# Patient Record
Sex: Male | Born: 1940 | Race: White | Hispanic: No | Marital: Married | Smoking: Former smoker
Health system: Southern US, Community
[De-identification: ages and names within clinical notes are randomized; demographics above are authoritative.]

## PROBLEM LIST (undated history)

## (undated) DIAGNOSIS — H409 Unspecified glaucoma: Secondary | ICD-10-CM

## (undated) DIAGNOSIS — E785 Hyperlipidemia, unspecified: Secondary | ICD-10-CM

## (undated) DIAGNOSIS — I2510 Atherosclerotic heart disease of native coronary artery without angina pectoris: Secondary | ICD-10-CM

## (undated) DIAGNOSIS — S8290XA Unspecified fracture of unspecified lower leg, initial encounter for closed fracture: Secondary | ICD-10-CM

## (undated) DIAGNOSIS — M5410 Radiculopathy, site unspecified: Secondary | ICD-10-CM

## (undated) DIAGNOSIS — I6529 Occlusion and stenosis of unspecified carotid artery: Secondary | ICD-10-CM

## (undated) DIAGNOSIS — M1990 Unspecified osteoarthritis, unspecified site: Secondary | ICD-10-CM

## (undated) DIAGNOSIS — IMO0001 Reserved for inherently not codable concepts without codable children: Secondary | ICD-10-CM

## (undated) DIAGNOSIS — L84 Corns and callosities: Secondary | ICD-10-CM

## (undated) DIAGNOSIS — E669 Obesity, unspecified: Secondary | ICD-10-CM

## (undated) DIAGNOSIS — N400 Enlarged prostate without lower urinary tract symptoms: Secondary | ICD-10-CM

## (undated) DIAGNOSIS — I219 Acute myocardial infarction, unspecified: Secondary | ICD-10-CM

## (undated) DIAGNOSIS — M21619 Bunion of unspecified foot: Secondary | ICD-10-CM

## (undated) DIAGNOSIS — I10 Essential (primary) hypertension: Secondary | ICD-10-CM

## (undated) HISTORY — DX: Unspecified glaucoma: H40.9

## (undated) HISTORY — DX: Hyperlipidemia, unspecified: E78.5

## (undated) HISTORY — DX: Atherosclerotic heart disease of native coronary artery without angina pectoris: I25.10

## (undated) HISTORY — PX: CORONARY ANGIOPLASTY: SHX604

## (undated) HISTORY — DX: Essential (primary) hypertension: I10

## (undated) HISTORY — DX: Acute myocardial infarction, unspecified: I21.9

## (undated) HISTORY — DX: Radiculopathy, site unspecified: M54.10

## (undated) HISTORY — DX: Obesity, unspecified: E66.9

## (undated) HISTORY — DX: Unspecified fracture of unspecified lower leg, initial encounter for closed fracture: S82.90XA

## (undated) HISTORY — DX: Bunion of unspecified foot: M21.619

## (undated) HISTORY — DX: Occlusion and stenosis of unspecified carotid artery: I65.29

## (undated) HISTORY — PX: UMBILICAL HERNIA REPAIR: SHX196

## (undated) HISTORY — DX: Corns and callosities: L84

## (undated) HISTORY — PX: COLONOSCOPY: SHX174

---
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
## 1958-08-29 HISTORY — PX: HERNIA REPAIR: SHX51

## 1990-08-29 DIAGNOSIS — I219 Acute myocardial infarction, unspecified: Secondary | ICD-10-CM

## 1990-08-29 HISTORY — DX: Acute myocardial infarction, unspecified: I21.9

## 2004-05-29 HISTORY — PX: CARDIAC CATHETERIZATION: SHX172

## 2004-06-25 ENCOUNTER — Ambulatory Visit (HOSPITAL_COMMUNITY): Admission: AD | Admit: 2004-06-25 | Discharge: 2004-06-26 | Payer: Self-pay | Admitting: Cardiology

## 2007-01-03 HISTORY — PX: US ECHOCARDIOGRAPHY: HXRAD669

## 2008-03-04 ENCOUNTER — Encounter: Admission: RE | Admit: 2008-03-04 | Discharge: 2008-03-04 | Payer: Self-pay | Admitting: Family Medicine

## 2010-02-26 HISTORY — PX: CARDIOVASCULAR STRESS TEST: SHX262

## 2010-09-19 ENCOUNTER — Encounter: Payer: Self-pay | Admitting: Family Medicine

## 2011-01-14 NOTE — Discharge Summary (Signed)
NAMEDEMARCUS, THIELKE               ACCOUNT NO.:  [id number]   MEDICAL RECORD NO.:  [id number]          PATIENT TYPE:  OIB   LOCATION:  6527                         FACILITY:  [facility]   PHYSICIAN:  Colleen Can. Deborah Chalk, M.D.DATE OF BIRTH:  01/02/1941   DATE OF ADMISSION:  DATE OF DISCHARGE:  06/26/2004                                 DISCHARGE SUMMARY   PRIMARY DISCHARGE DIAGNOSES:  Atypical chest pain in a setting of an  abnormal Cardiolite study with subsequent elective cardiac catheterization  showing 100% distal right coronary artery occlusion with left-to-right  collaterals as well as antegrade flow, normal left main, 50% narrowing in  the left circumflex proximally with irregularity to the LAD.  Angioplasty of  the right coronary artery was unsuccessful.   SECONDARY DISCHARGE DIAGNOSES:  1.  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  2.  Hyperlipidemia.  3.  Flushing of uncertain etiology.  4.  Known C5-C6 radiculopathy.  5.  Hypertension.  6.  Obesity.   HISTORY OF PRESENT ILLNESS:  Mr. Lerew is a pleasant 70 year old male who  has known coronary disease.  He had an inferior posterior myocardial  infarction in July of 1992, and has been followed medically since that time.  He has had some complaints of atypical chest pain with some mild shortness  of breath.  He was referred for his routine Cardiolite study which was  performed in October of 2005.  This did show inferior ST-changes on his EKG.  His Cardiolite imaging showed inferior basilar perfusion abnormality.  He  was subsequently referred for repeat cardiac catheterization.   Please see the dictated history and physical for further patient  presentation and profile.   LABORATORY DATA:  CBC was normal.  PT and PTT were unremarkable.  Chemistries were normal except for glucose of 116.   HOSPITAL COURSE:  The patient was admitted electively.   We proceeded on with  cardiac catheterization on June 25, 2004.  Those results are as noted  above.  Angioplasty to the right coronary was unsuccessful.  Post-procedure,  he was transferred to 6500.  His groin has remained satisfactory.  It in  anticipation for him to be discharge in the morning if deemed stable on a.m.  rounds.  He will be started on Plavix 75 mg a day on November 7, with plans  for repeat catheterization and with repeat attempts at percutaneous coronary  intervention on the week of July 12, 2004.   DISCHARGE CONDITION:  Stable.   DISCHARGE MEDICINES:  1.  He was will resume all of his previous medicines.  2.  We will start Plavix 75 mg a day as of November 7, to be taken along      with aspirin.  3.  We will see him back in the office on November 7.  He is asked to call      to set up an actual time for that date.  4.  He is to call our office if any problems arise in the interim.  LC/MEDQ  D:  06/25/2004  T:  06/26/2004  Job:  [id number]   cc:   Miguel Aschoff, M.D.  [address]  [place]  [place] [address]  Fax: [phone]

## 2011-01-14 NOTE — Cardiovascular Report (Signed)
NAMELARRY, Tyler               ACCOUNT NO.:  [id number]   MEDICAL RECORD NO.:  [id number]          PATIENT TYPE:  OIB   LOCATION:  6527                         FACILITY:  [facility]   PHYSICIAN:  Colleen Can. Deborah Chalk, M.D.DATE OF BIRTH:  1941-08-05   DATE OF PROCEDURE:  DATE OF DISCHARGE:                              CARDIAC CATHETERIZATION   HISTORY:  [name] had previous remote myocardial infarction.  He presents  with abnormal stress Cardiolite study with reversible inferior ischemia.  He  is referred now for catheterization.   PROCEDURE:  Left heart catheterization with selective coronary angiography,  left ventricular angiography, attempt at angioplasty and crossing of the  chronic total occlusion of the right coronary artery.   TYPE AND SITE OF ENTRY:  Percutaneous, right femoral artery.   CATHETERS:  6-French 4 curved Judkins right and left coronary catheters; 6-  French pigtail ventriculographic catheter; 7-French JL4 guide with side  holes; Asahi medium wire, Prowater; a Miracle 4.5 wire with a 2.0 x 20 mm  over-the-wire Maverick balloon.   CONTRAST MATERIAL:  Omnipaque 280 mL.   MEDICATIONS GIVEN PRIOR TO THE PROCEDURE:  Versed 3 mg IV, Angiomax, and  Plavix 300 mg p.o.   COMMENTS:  The patient tolerated the procedure well.   HEMODYNAMIC DATA:  1.  The aortic pressure was 136/78.  2.  LV was 123/9-14.  3.  There was no aortic valve gradient noted on pullback.   ANGIOGRAPHIC DATA:  1.  Left main coronary artery is normal.  2.  Left circumflex has a high obtuse marginal, is tortuous with      irregularities.  There is 50% narrowing in the main body of the left      circumflex, but no significant focal obstructive disease.  3.  Left anterior descending:  Left anterior descending has irregularities.      There is collateral flow by way of large septal perforating branches to      the distal posterior descending vessel.  4.  Right coronary artery:  The right  coronary artery is totally occluded.      There is bidirectional fill from the posterior descending and large      lateral branches.  There appears to be a short segment of total      occlusion.   LEFT VENTRICULOGRAM:  Left ventricular angiogram was performed in the RAO  position.  Overall cardiac size and silhouette are normal.  The global left  ventricular function study shows an ejection fraction of 60%.   ATTEMPTED ANGIOPLASTY:  Using a JL4 guide we initially attempted to pass a  medium Asahi wire.  We were unable to pass this and then proceeded on with a  Prowater guide and then attempted to cross with a Miracle Brothers 4.5  guidewire.  We were able to pass the Prowater guidewire down the right  coronary artery, but were unable to cross the lesion just before the crux in  the right coronary artery.  We used the Prowater to allow [place] to going into  large acute marginal vessel.  We used the 2.0 x 20  mm Maverick balloon to  allow [place] to redirect the Miracle Brothers wire into the total occlusion.  As  we were advancing the wire we were able to advance the Miracle Brothers wire  into the posterior descending.  However, at that point in time guide backup  became an issue and the guide catheter basically prolapsed out of the right  coronary artery and we were no longer able to have guide position.  Because  of the length of the procedure as well as the amount of contrast given, it  was elected to abort the procedure at this point in time and return at the  time we have adequate backup and are able to have a greater range of  contrast medium to use to be able to cross the lesion.   OVERALL IMPRESSION:  1.  Normal left ventricular function.  2.  Totally occluded right coronary artery.  3.  Moderate coronary atherosclerosis in the left system.  4.  Unsuccessful attempt at percutaneous intervention of the right coronary      artery.       SNT/MEDQ  D:  06/25/2004  T:  06/25/2004  Job:   [id number]

## 2011-01-14 NOTE — H&P (Signed)
Curtis Tyler, Curtis Tyler               ACCOUNT NO.:  [id number]   MEDICAL RECORD NO.:  [id number]          PATIENT TYPE:  OIB   LOCATION:                               FACILITY:  [facility]   PHYSICIAN:  Colleen Can. Deborah Chalk, M.D.DATE OF BIRTH:  Sep 02, 1940   DATE OF ADMISSION:  06/25/2004  DATE OF DISCHARGE:                                HISTORY & PHYSICAL   CHIEF COMPLAINT:  None.   HISTORY OF PRESENT ILLNESS:  Mr. Needs is a 70 year old male who has a  known history of coronary disease.  He suffered an inferior posterior  myocardial infarction in July of 1992.  He had a heart catheterization  performed at [facility] in [place].  He has basically  done well since that time and has been followed along clinically with  routine treadmill testing. He was seen in the office towards the latter part  of September and was referred for a Cardiolite study.  This was performed on  June 01, 2004.  The patient exercised on the standard Bruce protocol for a  total of 12 minutes.  He had excellent and stable exercise tolerance.  His  blood pressure response was adequate.  He had no complaints of chest pain.  There was some mild shortness of breath.  His EKG did show inferior ST  changes, and the Cardiolite images showed inferobasilar perfusion  abnormality with possible discrete inferobasal hypokinesia.  The LV ejection  fraction was 70%.  In light of these findings, he is now referred for repeat  cardiac catheterization.  Clinically, he has had no complaints of chest  pain.  There has been some questionable sharp, stabbing like discomfort in  the left shoulder with walking.   PAST MEDICAL HISTORY:  1.  Atherosclerotic cardiovascular disease with reported inferior posterior      myocardial infarction in July of 1992.  His catheterization at that time      showed the first septal perforating branch with a 70% narrowing, 40%      left circumflex, 30% OM, dominant right coronary with  a 50% mid vessel      narrowing and 100% occlusion of the distal right.  2.  Hyperlipidemia.  3.  History of flushing of uncertain etiology.  4.  Known C5-C6 radiculopathy.  5.  Hypertension.  6.  Obesity.   SURGICAL HISTORY:  He has had previous right hernia repair in 1960 and  appendectomy in 1958.   ALLERGIES:  None.   CURRENT MEDICATIONS:  1.  Magnesium daily.  2.  Vytorin 10/40 daily.  3.  Proscar daily.  4.  Flomax 0.4 daily.  5.  Diovan 160 a day.  6.  Saw palmetto daily.  7.  Aspirin daily.   FAMILY HISTORY:  There is a positive history of hypertension and stroke  among his parents who lived up until their late 68's.   SOCIAL HISTORY:  He is married.  He has had no tobacco products for at least  20 years. He has minimal alcohol use.  He lives at home with his wife.   REVIEW  OF SYSTEMS:  Basically as noted above.  He has not really had much in  the way of chest pain.  He has primarily been troubled with intermittent  flushing and redness of the face as well as a burning like discomfort of the  eyes.  He has had a recent 24-hour urine for catecholamines and  metanephrines and 5HIAA which was unremarkable.   PHYSICAL EXAMINATION:  GENERAL:  He is a pleasant, white male, in no acute  distress.  VITAL SIGNS:  Weight 192 pounds.  Blood pressure 170 sitting and 110/80  standing.  Heart rate is 84.  LUNGS:  Clear.  HEART:  Regular rhythm.  ABDOMEN:  Soft, positive bowel sounds, nontender.  EXTREMITIES:  Without edema.  NEUROLOGIC:  Intact.  There are no gross focal deficits.   LABORATORY DATA:  Pertinent labs are pending.   OVERALL IMPRESSION/>  1.  Abnormal stress Cardiolite.  2.  Known ischemic heart disease.  3.  Hyperlipidemia.  4.  Hypertension.  5.  Flushing of uncertain etiology.   PLAN:  1.  We will proceed on with elective cardiac catheterization.  2.  Procedure has been reviewed in full detail, and he is going to proceed      on Friday, June 25, 2004.       ________________________________________  Sharlee Blew, N.P.  ___________________________________________  Colleen Can. Deborah Chalk, M.D.    LC/MEDQ  D:  06/16/2004  T:  06/16/2004  Job:  [id number]   cc:   Miguel Aschoff, M.D.  [address], [address]  [place]  [place] [address]  Fax: [phone]

## 2011-04-25 ENCOUNTER — Encounter: Payer: Self-pay | Admitting: Cardiology

## 2011-04-26 ENCOUNTER — Encounter: Payer: Self-pay | Admitting: Cardiology

## 2011-04-26 ENCOUNTER — Ambulatory Visit (INDEPENDENT_AMBULATORY_CARE_PROVIDER_SITE_OTHER): Payer: Medicare Other | Admitting: Cardiology

## 2011-04-26 VITALS — BP 118/74 | HR 75 | Ht 62.0 in | Wt 202.0 lb

## 2011-04-26 DIAGNOSIS — I679 Cerebrovascular disease, unspecified: Secondary | ICD-10-CM

## 2011-04-26 DIAGNOSIS — E785 Hyperlipidemia, unspecified: Secondary | ICD-10-CM

## 2011-04-26 DIAGNOSIS — I10 Essential (primary) hypertension: Secondary | ICD-10-CM | POA: Insufficient documentation

## 2011-04-26 DIAGNOSIS — I2510 Atherosclerotic heart disease of native coronary artery without angina pectoris: Secondary | ICD-10-CM

## 2011-04-26 NOTE — Patient Instructions (Signed)
Your physician wants you to follow-up in: ONE YEAR You will receive a reminder letter in the mail two months in advance. If you don't receive a letter, please call our office to schedule the follow-up appointment.  

## 2011-04-26 NOTE — Progress Notes (Signed)
HPI: 70 year old male previously followed by Dr. Deborah Chalk for followup of coronary artery disease. The patient had an inferoposterior myocardial infarction in 1992. He was managed medically. Last catheterization in October of 2005 showed an occluded right coronary artery with left to right collaterals. The left main was normal. There was a 50% circumflex and irregularities in the LAD. Attempt at PCI of the right coronary artery was unsuccessful. Last Myoview was performed in July of 2011. There was a small area of mild infarct and peri-infarct ischemia in the inferobasal wall. Ejection fraction was 65%. This was unchanged compared to 2005. He has been treated medically. Also with cerebrovascular disease. Since he was last seen in he denies dyspnea on exertion, orthopnea, PND, pedal edema, syncope or chest pain. He does have problems with aching in his lower extremities bilaterally. No claudication.  Current Outpatient Prescriptions  Medication Sig Dispense Refill  . amLODipine (NORVASC) 5 MG tablet Take 5 mg by mouth daily.        Marland Kitchen aspirin 81 MG tablet Take 81 mg by mouth daily.        . clopidogrel (PLAVIX) 75 MG tablet Take 75 mg by mouth daily.        . Coenzyme Q10 (CO Q 10) 100 MG CAPS Take by mouth daily.        . finasteride (PROSCAR) 5 MG tablet Take 5 mg by mouth daily.        . fish oil-omega-3 fatty acids 1000 MG capsule Take 1 g by mouth daily.        . fluticasone (VERAMYST) 27.5 MCG/SPRAY nasal spray Place 2 sprays into the nose daily.        Marland Kitchen GLUCOSAMINE-CHONDROITIN PO Take by mouth daily.        Marland Kitchen losartan (COZAAR) 100 MG tablet Take 100 mg by mouth daily.        . meloxicam (MOBIC) 7.5 MG tablet Take 7.5 mg by mouth as needed.        . Plant Sterols and Stanols (CHOLEST OFF) 450 MG TABS Take by mouth as directed.        . Tamsulosin HCl (FLOMAX) 0.4 MG CAPS Take 0.4 mg by mouth daily.           Past Medical History  Diagnosis Date  . Hyperlipidemia   . Hypertension   .  Coronary artery disease   . Obesity   . Carotid artery occlusion   . 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 Radiculopathy     Past Surgical History  Procedure Date  . Cardiac catheterization 05/2004  . Hernia repair 1960    right hernia repair  . Appendectomy 1958    History   Social History  . Marital Status: Married    Spouse Name: N/A    Number of Children: N/A  . Years of Education: N/A   Occupational History  . Not on file.   Social History Main Topics  . Smoking status: Former Smoker    Quit date: 08/29/1990  . Smokeless tobacco: Not on file  . Alcohol Use: Yes     minimal  . Drug Use: No  . Sexually Active:    Other Topics Concern  . Not on file   Social History Narrative   Positive HTN and Stroke among his parents who lived up until their late 80's    ROS: no fevers or chills, productive cough, hemoptysis, dysphasia, odynophagia, melena, hematochezia, dysuria, hematuria, rash, seizure activity, orthopnea,  PND, pedal edema, claudication. Remaining systems are negative.  Physical Exam: Well-developed well-nourished in no acute distress.  Skin is warm and dry.  HEENT is normal.  Neck is supple. No thyromegaly.  Chest is clear to auscultation with normal expansion.  Cardiovascular exam is regular rate and rhythm.  Abdominal exam nontender or distended. No masses palpated. Extremities show no edema. neuro grossly intact  ECG NSRl, PVC, RVCD

## 2011-04-26 NOTE — Assessment & Plan Note (Signed)
Continue aspirin and statin. Carotid Dopplers are followed by his primary care.

## 2011-04-26 NOTE — Assessment & Plan Note (Signed)
Continue aspirin and statin. 

## 2011-04-26 NOTE — Assessment & Plan Note (Signed)
Patient discontinued his Crestor one week ago secondary to myalgias. If his symptoms improve we will try a different statin. If not we will resume Crestor.

## 2011-04-26 NOTE — Assessment & Plan Note (Signed)
Blood pressure controlled. Continue present medications. Potassium and renal function were monitored by primary care.

## 2011-05-09 ENCOUNTER — Ambulatory Visit: Payer: Self-pay | Admitting: Cardiology

## 2011-06-06 ENCOUNTER — Encounter: Payer: Self-pay | Admitting: Cardiology

## 2011-06-07 ENCOUNTER — Encounter: Payer: Self-pay | Admitting: Cardiology

## 2011-08-01 ENCOUNTER — Telehealth: Payer: Self-pay | Admitting: Internal Medicine

## 2011-08-01 NOTE — Telephone Encounter (Signed)
I scheduled the pt to see CY tomorrow at 9am, he had a cancellation. I LMTCB if the pt could not keep this appt to reschedule. I will lwave the message open untl tomorrow to see if the pt comes.  Carron Curie, CMA

## 2011-08-01 NOTE — Telephone Encounter (Signed)
Called and spoke with pt. Pt states he had a "cold" approx 6 weeks ago which never cleared up completely.  Pt states he now is having an ongoing cough- worse during the day.  Will cough up clear to yellow colored sputum.  Denies any other symptoms- no f/c/s, increased sob/wheezing/tightness in chest or chest pain or runny/stuffy nose.  Pt states he has been taking Mucinex and OTC cough syrup with no relief of symptoms.  Pt's wife, Bela Nyborg, is a pt of Dr. Roxy Cedar and therefore pt is requesting a consult with CY as well asap.  Please advise.  Thanks.

## 2011-08-01 NOTE — Telephone Encounter (Signed)
Per CY-okay to put in next open consult slot-no working in at this time.

## 2011-08-02 ENCOUNTER — Encounter: Payer: Self-pay | Admitting: Internal Medicine

## 2011-08-02 ENCOUNTER — Ambulatory Visit (INDEPENDENT_AMBULATORY_CARE_PROVIDER_SITE_OTHER)
Admission: RE | Admit: 2011-08-02 | Discharge: 2011-08-02 | Disposition: A | Discharge status: Home / Self Care | Payer: Medicare Other | Source: Ambulatory Visit | Attending: Internal Medicine | Admitting: Internal Medicine

## 2011-08-02 ENCOUNTER — Ambulatory Visit (INDEPENDENT_AMBULATORY_CARE_PROVIDER_SITE_OTHER): Payer: Medicare Other | Admitting: Internal Medicine

## 2011-08-02 VITALS — BP 144/68 | HR 76 | Ht 62.0 in | Wt 205.2 lb

## 2011-08-02 DIAGNOSIS — R05 Cough: Secondary | ICD-10-CM

## 2011-08-02 DIAGNOSIS — J40 Bronchitis, not specified as acute or chronic: Secondary | ICD-10-CM

## 2011-08-02 MED ORDER — BENZONATATE 200 MG PO CAPS: 200.0000 mg | ORAL_CAPSULE | Freq: Three times a day (TID) | ORAL | Status: AC | PRN

## 2011-08-02 NOTE — Patient Instructions (Signed)
Order- CXR  Dx bronchitis  Sample Symbicort 160      2 puffs then rinse mouth, twice daily till sample used up.  Script benzonatate  For cough as needed

## 2011-08-02 NOTE — Progress Notes (Signed)
08/02/11- 70 yoM self referred for persistent cough, complicated by hx of HBP, CAD, cerebrovascular disease. Wife is patient here. PCP C.Miguel Aschoff. He had smoked as much as 2 packs per day until stopping in 1982. He denies prior diagnosed lung disease. October 16 he traveled to [place]. One week after return he developed what seemed an ordinary cold with cough and low-grade fever. Cough was initially productive of slightly discolored sputum. He gradually dried up but he never stopped coughing he doesn't feel badly and denies fever, shortness of breath, chest pain, swelling, nodes, reflux or postnasal drip. He has had pneumococcal vaccine, flu vaccine and shingles vaccine. History of coronary disease with silent MI, HBP. No history of allergic rhinitis or GERD.  ROS - see HPI Constitutional:   No-   weight loss, night sweats, fevers, chills, fatigue, lassitude. HEENT:   No-  headaches, difficulty swallowing, tooth/dental problems, sore throat,       No-  sneezing, itching, ear ache, nasal congestion, post nasal drip,  CV:  No-   chest pain, orthopnea, PND, swelling or discomfort in lower extremities, anasarca,                                  dizziness, palpitations Resp: No-   shortness of breath with exertion or at rest.              No-   productive cough,  + non-productive cough,  No- coughing up of blood.              No-   change in color of mucus.  No- wheezing.   Skin: No-   rash or lesions. GI:  No-   heartburn, indigestion, abdominal pain, nausea, vomiting, diarrhea,                 change in bowel habits, loss of appetite GU: No-   dysuria, change in color of urine, no urgency or frequency.  No- flank pain. MS:  No-   joint pain or swelling.  No- decreased range of motion.  No- back pain. Neuro-     nothing unusual Psych:  No- change in mood or affect. No depression or anxiety.  No memory loss.  OBJ General- Alert, Oriented, Affect-appropriate, Distress- none acute, obese Skin-  rash-none, lesions- none, excoriation- none Lymphadenopathy- none Head- atraumatic            Eyes- Gross vision intact, PERRLA, conjunctivae clear secretions            Ears- Hearing, canals-normal            Nose- Clear, no-Septal dev, mucus, polyps, erosion, perforation             Throat- Mallampati II , mucosa clear , drainage- none, tonsils- atrophic Neck- flexible , trachea midline, no stridor , thyroid nl, carotid no bruit Chest - symmetrical excursion , unlabored           Heart/CV- RRR , no murmur , no gallop  , no rub, nl s1 s2                           - JVD- none , edema- none, stasis changes- none, varices- none           Lung- transient inspiratory crackles L>R base, wheeze- none, + raspy cough , dullness-none, rub- none  Chest wall-  Abd- tender-no, distended-no, bowel sounds-present, HSM- no Br/ Gen/ Rectal- Not done, not indicated Extrem- cyanosis- none, clubbing, none, atrophy- none, strength- nl Neuro- grossly intact to observation

## 2011-08-04 ENCOUNTER — Telehealth: Payer: Self-pay | Admitting: Internal Medicine

## 2011-08-04 NOTE — Telephone Encounter (Signed)
Spoke with pt and notified of cxr results per CDY. Pt verbalized understanding. Nothing further needed.

## 2011-08-04 NOTE — Progress Notes (Signed)
Quick Note:  Pt notified of results and he verbalized understanding ______ 

## 2011-08-06 DIAGNOSIS — R059 Cough, unspecified: Secondary | ICD-10-CM | POA: Insufficient documentation

## 2011-08-06 DIAGNOSIS — R05 Cough: Secondary | ICD-10-CM | POA: Insufficient documentation

## 2011-08-06 NOTE — Assessment & Plan Note (Signed)
By the timing, this is most likely a post viral bronchitis/bronchiolitis. I do not currently suspect atypical infection or other consequences of his trip to [place]. Plan-chest x-ray, try Symbicort

## 2011-09-06 ENCOUNTER — Ambulatory Visit: Payer: Medicare Other | Admitting: Internal Medicine

## 2011-11-07 ENCOUNTER — Encounter: Payer: Self-pay | Admitting: *Deleted

## 2012-09-05 ENCOUNTER — Encounter: Payer: Self-pay | Admitting: Cardiology

## 2012-09-05 ENCOUNTER — Ambulatory Visit (INDEPENDENT_AMBULATORY_CARE_PROVIDER_SITE_OTHER): Payer: Medicare Other | Admitting: Cardiology

## 2012-09-05 VITALS — BP 122/70 | HR 71 | Wt 188.0 lb

## 2012-09-05 DIAGNOSIS — R079 Chest pain, unspecified: Secondary | ICD-10-CM

## 2012-09-05 DIAGNOSIS — I679 Cerebrovascular disease, unspecified: Secondary | ICD-10-CM

## 2012-09-05 DIAGNOSIS — E785 Hyperlipidemia, unspecified: Secondary | ICD-10-CM

## 2012-09-05 DIAGNOSIS — I2510 Atherosclerotic heart disease of native coronary artery without angina pectoris: Secondary | ICD-10-CM

## 2012-09-05 DIAGNOSIS — I10 Essential (primary) hypertension: Secondary | ICD-10-CM

## 2012-09-05 MED ORDER — ISOSORBIDE MONONITRATE ER 30 MG PO TB24: 30.0000 mg | ORAL_TABLET | Freq: Every day | ORAL | Status: DC

## 2012-09-05 NOTE — Progress Notes (Signed)
HPI: Pleasant male for followup of coronary artery disease. The patient had an inferoposterior myocardial infarction in 1992. He was managed medically. Last catheterization in October of 2005 showed an occluded right coronary artery with left to right collaterals. The left main was normal. There was a 50% circumflex and irregularities in the LAD. Attempt at PCI of the right coronary artery was unsuccessful. Last Myoview was performed in July of 2011. There was a small area of mild infarct and peri-infarct ischemia in the inferobasal wall. Ejection fraction was 65%. This was unchanged compared to 2005. He has been treated medically. Also with cerebrovascular disease followed by primary care. Since he was last seen in August of 2012, he denies dyspnea on exertion, orthopnea, PND, pedal edema, syncope; he does describe pain in his left shoulder and left upper extremity with extreme activities but not routine activities. It is relieved with rest. He has had this since 1992 when he had his myocardial infarction. He does state that is mildly worse.   Current Outpatient Prescriptions  Medication Sig Dispense Refill  . amLODipine (NORVASC) 5 MG tablet Take 5 mg by mouth daily.        Marland Kitchen aspirin 81 MG tablet Take 81 mg by mouth daily.        . clopidogrel (PLAVIX) 75 MG tablet Take 75 mg by mouth daily.        . Coenzyme Q10 (CO Q 10) 100 MG CAPS Take by mouth daily.        Marland Kitchen doxazosin (CARDURA) 4 MG tablet Take 4 mg by mouth at bedtime.        . finasteride (PROSCAR) 5 MG tablet Take 5 mg by mouth daily.        . fish oil-omega-3 fatty acids 1000 MG capsule Take 1 g by mouth daily.        . fluticasone (VERAMYST) 27.5 MCG/SPRAY nasal spray Place 2 sprays into the nose daily.        Marland Kitchen GLUCOSAMINE-CHONDROITIN PO Take by mouth daily.        Marland Kitchen latanoprost (XALATAN) 0.005 % ophthalmic solution Place 1 drop into both eyes daily.      Marland Kitchen losartan (COZAAR) 100 MG tablet Take 100 mg by mouth daily.        .  meloxicam (MOBIC) 7.5 MG tablet Take 7.5 mg by mouth as needed.        . Plant Sterols and Stanols (CHOLEST OFF) 450 MG TABS Take by mouth as directed.        . pravastatin (PRAVACHOL) 80 MG tablet Take 80 mg by mouth daily.         Past Medical History  Diagnosis Date  . Hyperlipidemia   . Hypertension   . Coronary artery disease   . Obesity   . Carotid artery occlusion   . 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 Radiculopathy     Past Surgical History  Procedure Date  . Hernia repair 1960    right hernia repair  . Appendectomy 1958  . Cardiac catheterization 05/2004    SHOWING 100% OCCLUDED DISTAL RIGHT CORONARY WITH  LEFT  TO RIGHT COLLATERALS, AS WELL AS ANTEGRADE FLOW, NORMAL LEFT MAIN, 50% NARROWING IN LEFT CIRCUMFLEX  WITH IRREGULARITIES IN THE LAD  . Coronary angioplasty     RIGHT CORONARY WERE UNSUCCESSFUL  . US echocardiography 01/03/2007    EF 55-60%  . Cardiovascular stress test 02/26/2010    EF 65%, SMALL AREA OF  MILD INFARCT AND POSSIBLE PER-INFARCT ISCHEMIA IN THE INFEROBASAL WALL    History   Social History  . Marital Status: Married    Spouse Name: N/A    Number of Children: N/A  . Years of Education: N/A   Occupational History  . retired    Social History Main Topics  . Smoking status: Former Smoker -- 1.0 packs/day for 25 years    Types: Cigarettes    Quit date: 08/29/1990  . Smokeless tobacco: Not on file  . Alcohol Use: Yes     Comment: minimal  . Drug Use: No  . Sexually Active: Not on file   Other Topics Concern  . Not on file   Social History Narrative   Positive HTN and Stroke among his parents who lived up until their late 80's    ROS: arthralgias but no fevers or chills, productive cough, hemoptysis, dysphasia, odynophagia, melena, hematochezia, dysuria, hematuria, rash, seizure activity, orthopnea, PND, pedal edema, claudication. Remaining systems are negative.  Physical Exam: Well-developed well-nourished in no acute  distress.  Skin is warm and dry.  HEENT is normal.  Neck is supple.  Chest is clear to auscultation with normal expansion.  Cardiovascular exam is regular rate and rhythm.  Abdominal exam nontender or distended. No masses palpated. Extremities show no edema. neuro grossly intact  ECG sinus rhythm at a rate of 71. First degree AV block. RV conduction delay. No ST changes.

## 2012-09-05 NOTE — Assessment & Plan Note (Signed)
Patient is describing left upper extremity pain with extreme activities that is somewhat worse. His symptoms seem consistent with angina. He has a known occluded right coronary artery and he has had the symptoms for 20 years. I will add Imdur 30 mg daily. I will also arrange a repeat functional study. If he has ischemia in the LAD or circumflex distribution he may require repeat catheterization.

## 2012-09-05 NOTE — Assessment & Plan Note (Signed)
Continue aspirin and statin. Carotid Dopplers are followed by vascular surgery.

## 2012-09-05 NOTE — Assessment & Plan Note (Signed)
Continue aspirin and statin. 

## 2012-09-05 NOTE — Patient Instructions (Addendum)
Your physician recommends that you schedule a follow-up appointment in: 3 MONTHS WITH DR Jens Som  Your physician has requested that you have en exercise stress myoview. For further information please visit [email or web address] Please follow instruction sheet, as given.   START ISOSORBIDE 30 MG ONCE DAILY

## 2012-09-05 NOTE — Assessment & Plan Note (Signed)
Blood pressure controlled. Continue present medications. 

## 2012-09-05 NOTE — Assessment & Plan Note (Signed)
Continue statin. Lipids and liver monitored by primary care. 

## 2012-09-12 ENCOUNTER — Ambulatory Visit (HOSPITAL_COMMUNITY): Payer: Medicare Other | Attending: Cardiovascular Disease | Admitting: Radiology

## 2012-09-12 VITALS — Ht 63.0 in | Wt 184.0 lb

## 2012-09-12 DIAGNOSIS — R079 Chest pain, unspecified: Secondary | ICD-10-CM | POA: Insufficient documentation

## 2012-09-12 DIAGNOSIS — R0602 Shortness of breath: Secondary | ICD-10-CM

## 2012-09-12 DIAGNOSIS — R0609 Other forms of dyspnea: Secondary | ICD-10-CM | POA: Insufficient documentation

## 2012-09-12 DIAGNOSIS — I10 Essential (primary) hypertension: Secondary | ICD-10-CM | POA: Insufficient documentation

## 2012-09-12 DIAGNOSIS — R0989 Other specified symptoms and signs involving the circulatory and respiratory systems: Secondary | ICD-10-CM | POA: Insufficient documentation

## 2012-09-12 DIAGNOSIS — I2510 Atherosclerotic heart disease of native coronary artery without angina pectoris: Secondary | ICD-10-CM

## 2012-09-12 MED ORDER — TECHNETIUM TC 99M SESTAMIBI GENERIC - CARDIOLITE
10.0000 | Freq: Once | INTRAVENOUS | Status: AC | PRN
  Administered 2012-09-12: 10 via INTRAVENOUS

## 2012-09-12 MED ORDER — TECHNETIUM TC 99M SESTAMIBI GENERIC - CARDIOLITE
30.0000 | Freq: Once | INTRAVENOUS | Status: AC | PRN
  Administered 2012-09-12: 30 via INTRAVENOUS

## 2012-09-12 NOTE — Progress Notes (Signed)
[facility] SITE 3 NUCLEAR MED [address] [place], [place] [address] [phone]    Cardiology Nuclear Med Study  Curtis Tyler is a 72 y.o. male     MRN : [id number]     DOB: 05-28-1941  Procedure Date: 09/12/2012  Nuclear Med Background Indication for Stress Test:  Evaluation for Ischemia History:  '92 inferoposterior MI, '05 Heart Catheterization-occluded RCA with attempted unsuccessful PCI, L>R collaterals, 50% CFX, EF=60%, '08 Echo: EF=55-60%, and '11 Myocardial Perfusion Study-inferior small infarct with peri-infarct ischemia, EF=65% (Tx med) Cardiac Risk Factors: Carotid Disease, History of Smoking, Hypertension and Lipids  Symptoms: Chest pain with/without exertion with (L) shoulder and (L) upper arm pain that is worse with extreme activities and relieved at rest> chronic but worsening, DOE    Nuclear Pre-Procedure Caffeine/Decaff Intake:  None NPO After: 10:00pm   Lungs:  clear O2 Sat: 94% on room air. IV 0.9% NS with Angio Cath:  22g  IV Site: R Antecubital  IV Started by:  Bonnita Levan, RN  Chest Size (in):  44 Cup Size: n/a  Height: 5\' 3"  (1.6 m)  Weight:  184 lb (83.462 kg)  BMI:  Body mass index is 32.59 kg/(m^2). Tech Comments: Patient has a follow-up office visit with Dr. Olga Millers on 09-26-12.    Nuclear Med Study 1 or 2 day study: 1 day  Stress Test Type:  Stress  Reading MD: Kristeen Miss, MD  Order Authorizing Provider:  Olga Millers, MD  Resting Radionuclide: Technetium 19m Sestamibi  Resting Radionuclide Dose: 11.0 mCi   Stress Radionuclide:  Technetium 10m Sestamibi  Stress Radionuclide Dose: 33.0 mCi           Stress Protocol Rest HR: 73 Stress HR: 126  Rest BP: 102/65 Stress BP: 161/82  Exercise Time (min): 9:00 METS: 10.1   Predicted Max HR: 147 bpm % Max HR: 85.71 bpm Rate Pressure Product: 21308    Dose of Adenosine (mg):  n/a Dose of Lexiscan: n/a mg  Dose of Atropine (mg): n/a Dose of Dobutamine: n/a mcg/kg/min (at  max HR)  Stress Test Technologist: Irean Hong, RN  Nuclear Technologist:  Domenic Polite, CNMT     Rest Procedure:  Myocardial perfusion imaging was performed at rest 45 minutes following the intravenous administration of Technetium 38m Sestamibi. Rest ECG: NSR - Normal EKG  Stress Procedure:  The patient exercised on the treadmill utilizing the Bruce Protocol for 9:00 minutes, RPE=15. The patient stopped due to DOE, but denied any chest pain. There was a drop in BP to 149/76 immediately post exercise. There was a 3 beat wide complex tachycardia in early recovery. Technetium 60m Sestamibi was injected at peak exercise and myocardial perfusion imaging was performed after a brief delay. Stress ECG: No significant change from baseline ECG  QPS Raw Data Images:  There is interference from nuclear activity from structures below the diaphragm. This does not affect the ability to read the study. Stress Images:  There is a medium sized, severe defect in the mid inferolateral  and basal inferiolateral regions.  The uptake in the other regions are relatively normal.  Rest Images:  There is a very small, moderately severe defect in the inferior lateral wall.  The uptake in the other regions is fairly normal.      Subtraction (SDS): there is evidence of a previous inferior lateral MI with peri-MI ischemia.    There is evidence of a medium sized are of ischemia in the mid and basal inferolateral  region.    Transient Ischemic Dilatation (Normal <1.22):  1.22 Lung/Heart Ratio (Normal <0.45):  0.35  Quantitative Gated Spect Images QGS EDV:  75 ml QGS ESV:  32 ml  Impression Exercise Capacity:  Good exercise capacity. BP Response:  Normal blood pressure response. Clinical Symptoms:  No significant symptoms noted. ECG Impression:  No significant ST segment change suggestive of ischemia. Comparison with Prior Nuclear Study: This study is similar to the previous study in 2011 ( according to description of  the study - images are not available).  I cannot rule out the possiblity that the LCx stenosis has worsened.   Overall Impression:  Intermediate stress nuclear study.  There is evidence of a previous Inferior MI with peri-infarct ischemia.  He has a known occluded RCA that was not able to be opened in 2011. I cannot rule out worsening of his Left circumflex disease by this study.  LV Ejection Fraction: 58%.  LV Wall Motion:  There is hypokinesis of the inferiolateral base with well preserved LV function elsewhere.     Vesta Mixer, Montez Hageman., MD, [facility] 09/12/2012, 5:42 PM Office - [phone] Pager [phone]

## 2012-09-26 ENCOUNTER — Ambulatory Visit (INDEPENDENT_AMBULATORY_CARE_PROVIDER_SITE_OTHER): Payer: Medicare Other | Admitting: Cardiology

## 2012-09-26 ENCOUNTER — Encounter: Payer: Self-pay | Admitting: Cardiology

## 2012-09-26 VITALS — BP 118/70 | HR 74 | Ht 63.0 in | Wt 188.0 lb

## 2012-09-26 DIAGNOSIS — E785 Hyperlipidemia, unspecified: Secondary | ICD-10-CM

## 2012-09-26 DIAGNOSIS — I2510 Atherosclerotic heart disease of native coronary artery without angina pectoris: Secondary | ICD-10-CM

## 2012-09-26 DIAGNOSIS — I10 Essential (primary) hypertension: Secondary | ICD-10-CM

## 2012-09-26 DIAGNOSIS — I679 Cerebrovascular disease, unspecified: Secondary | ICD-10-CM

## 2012-09-26 NOTE — Assessment & Plan Note (Signed)
Continue statin. 

## 2012-09-26 NOTE — Assessment & Plan Note (Signed)
Continue aspirin and statin. The patient symptoms have improved with the addition of nitrates. His functional study shows inferior infarct and peri-infarct ischemia. This most likely corresponds to his previously documented occluded right coronary artery. We have elected to continue with medical therapy for now. If he has worsening symptoms in the future we will consider repeat catheterization at that time.

## 2012-09-26 NOTE — Patient Instructions (Addendum)
Your physician wants you to follow-up in: 6 MONTHS WITH DR CRENSHAW IN [place] You will receive a reminder letter in the mail two months in advance. If you don't receive a letter, please call our office to schedule the follow-up appointment.  

## 2012-09-26 NOTE — Assessment & Plan Note (Signed)
Blood pressure controlled. Continue present medications. 

## 2012-09-26 NOTE — Progress Notes (Signed)
HPI: Pleasant male for followup of coronary artery disease. The patient had an inferoposterior myocardial infarction in 1992. He was managed medically. Last catheterization in October of 2005 showed an occluded right coronary artery with left to right collaterals. The left main was normal. There was a 50% circumflex and irregularities in the LAD. Attempt at PCI of the right coronary artery was unsuccessful. Myoview was performed in July of 2011. There was a small area of mild infarct and peri-infarct ischemia in the inferobasal wall. Ejection fraction was 65%. This was unchanged compared to 2005. He has been treated medically. Myoview repeated in January of 2014. Ejection fraction was 58%. There was a prior inferior infarct with peri-infarct ischemia. Also with cerebrovascular disease followed by vascular surgery. I last saw him in Jan 2014. Since then, his symptoms have improved. He denies dyspnea. He has some left arm discomfort with extreme exertion but much better.   Current Outpatient Prescriptions  Medication Sig Dispense Refill  . amLODipine (NORVASC) 5 MG tablet Take 5 mg by mouth daily.        Marland Kitchen aspirin 81 MG tablet Take 81 mg by mouth daily.        . clopidogrel (PLAVIX) 75 MG tablet Take 75 mg by mouth daily.        . Coenzyme Q10 (CO Q 10) 100 MG CAPS Take by mouth daily.        Marland Kitchen doxazosin (CARDURA) 4 MG tablet Take 4 mg by mouth at bedtime.        . finasteride (PROSCAR) 5 MG tablet Take 5 mg by mouth daily.        . fish oil-omega-3 fatty acids 1000 MG capsule Take 1 g by mouth daily.        . fluticasone (VERAMYST) 27.5 MCG/SPRAY nasal spray Place 2 sprays into the nose as needed.       Marland Kitchen GLUCOSAMINE-CHONDROITIN PO Take 1 tablet by mouth daily.       . isosorbide mononitrate (IMDUR) 30 MG 24 hr tablet Take 1 tablet (30 mg total) by mouth daily.  90 tablet  3  . latanoprost (XALATAN) 0.005 % ophthalmic solution Place 1 drop into both eyes daily.      Marland Kitchen losartan (COZAAR) 100 MG  tablet Take 100 mg by mouth daily.        . meloxicam (MOBIC) 7.5 MG tablet Take 7.5 mg by mouth as needed.        . Plant Sterols and Stanols (CHOLEST OFF) 450 MG TABS Take by mouth as directed.        . pravastatin (PRAVACHOL) 80 MG tablet Take 80 mg by mouth daily.         Past Medical History  Diagnosis Date  . Hyperlipidemia   . Hypertension   . Coronary artery disease   . Obesity   . Carotid artery occlusion   . 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 Radiculopathy     Past Surgical History  Procedure Date  . Hernia repair 1960    right hernia repair  . Appendectomy 1958  . Cardiac catheterization 05/2004    SHOWING 100% OCCLUDED DISTAL RIGHT CORONARY WITH  LEFT  TO RIGHT COLLATERALS, AS WELL AS ANTEGRADE FLOW, NORMAL LEFT MAIN, 50% NARROWING IN LEFT CIRCUMFLEX  WITH IRREGULARITIES IN THE LAD  . Coronary angioplasty     RIGHT CORONARY WERE UNSUCCESSFUL  . US echocardiography 01/03/2007    EF 55-60%  . Cardiovascular stress test  02/26/2010    EF 65%, SMALL AREA OF MILD INFARCT AND POSSIBLE PER-INFARCT ISCHEMIA IN THE INFEROBASAL WALL    History   Social History  . Marital Status: Married    Spouse Name: N/A    Number of Children: N/A  . Years of Education: N/A   Occupational History  . retired    Social History Main Topics  . Smoking status: Former Smoker -- 1.0 packs/day for 25 years    Types: Cigarettes    Quit date: 08/29/1990  . Smokeless tobacco: Not on file  . Alcohol Use: Yes     Comment: minimal  . Drug Use: No  . Sexually Active: Not on file   Other Topics Concern  . Not on file   Social History Narrative   Positive HTN and Stroke among his parents who lived up until their late 80's    ROS: no fevers or chills, productive cough, hemoptysis, dysphasia, odynophagia, melena, hematochezia, dysuria, hematuria, rash, seizure activity, orthopnea, PND, pedal edema, claudication. Remaining systems are negative.  Physical Exam: Well-developed  well-nourished in no acute distress.  Skin is warm and dry.  HEENT is normal.  Neck is supple.  Chest is clear to auscultation with normal expansion.  Cardiovascular exam is regular rate and rhythm.  Abdominal exam nontender or distended. No masses palpated. Extremities show no edema. neuro grossly intact

## 2012-09-26 NOTE — Assessment & Plan Note (Signed)
Continue aspirin and statin. Managed by vascular surgery.

## 2012-10-02 ENCOUNTER — Telehealth: Payer: Self-pay | Admitting: Cardiology

## 2012-10-02 NOTE — Telephone Encounter (Signed)
Spoke with pt, he is having a different type sym[ptom than what he had before the stress test. He reports the pain down the left arm to the palm is gone. He now is getting a discomfort under his left armpit. It can occur with stress or exertion. If it occurs while exercising he will cont and it will eventually go away. He reports the pain is a 2 on scale 1-10. He is going out of town and wants to see dr Jens Som when he returns to discuss these symptoms and if a cath is indicated at this time. Follow up scheduled

## 2012-10-02 NOTE — Telephone Encounter (Signed)
Pt would like to talk to you today about his last visit

## 2012-10-19 ENCOUNTER — Ambulatory Visit (INDEPENDENT_AMBULATORY_CARE_PROVIDER_SITE_OTHER)
Admission: RE | Admit: 2012-10-19 | Discharge: 2012-10-19 | Disposition: A | Discharge status: Home / Self Care | Payer: Medicare Other | Source: Ambulatory Visit | Attending: Cardiology | Admitting: Cardiology

## 2012-10-19 ENCOUNTER — Encounter: Payer: Self-pay | Admitting: Cardiology

## 2012-10-19 ENCOUNTER — Encounter: Payer: Self-pay | Admitting: *Deleted

## 2012-10-19 ENCOUNTER — Ambulatory Visit (INDEPENDENT_AMBULATORY_CARE_PROVIDER_SITE_OTHER): Payer: Medicare Other | Admitting: Cardiology

## 2012-10-19 ENCOUNTER — Other Ambulatory Visit: Payer: Self-pay | Admitting: Cardiology

## 2012-10-19 VITALS — BP 138/71 | HR 75 | Wt 187.0 lb

## 2012-10-19 DIAGNOSIS — R079 Chest pain, unspecified: Secondary | ICD-10-CM

## 2012-10-19 DIAGNOSIS — I679 Cerebrovascular disease, unspecified: Secondary | ICD-10-CM

## 2012-10-19 DIAGNOSIS — I2510 Atherosclerotic heart disease of native coronary artery without angina pectoris: Secondary | ICD-10-CM

## 2012-10-19 DIAGNOSIS — I10 Essential (primary) hypertension: Secondary | ICD-10-CM

## 2012-10-19 DIAGNOSIS — E785 Hyperlipidemia, unspecified: Secondary | ICD-10-CM

## 2012-10-19 DIAGNOSIS — R072 Precordial pain: Secondary | ICD-10-CM

## 2012-10-19 LAB — CBC WITH DIFFERENTIAL/PLATELET
Basophils Absolute: 0 10*3/uL (ref 0.0–0.1)
HCT: 44.7 % (ref 39.0–52.0)
Hemoglobin: 15.1 g/dL (ref 13.0–17.0)
Lymphs Abs: 1.6 10*3/uL (ref 0.7–4.0)
MCHC: 33.9 g/dL (ref 30.0–36.0)
MCV: 93 fl (ref 78.0–100.0)
Monocytes Relative: 8.8 % (ref 3.0–12.0)
Neutro Abs: 3.7 10*3/uL (ref 1.4–7.7)
RDW: 13 % (ref 11.5–14.6)

## 2012-10-19 LAB — BASIC METABOLIC PANEL
BUN: 16 mg/dL (ref 6–23)
Chloride: 103 mEq/L (ref 96–112)
Glucose, Bld: 94 mg/dL (ref 70–99)
Potassium: 4.1 mEq/L (ref 3.5–5.1)

## 2012-10-19 NOTE — Assessment & Plan Note (Signed)
Continue aspirin and statin. Followed by vascular surgery. 

## 2012-10-19 NOTE — Assessment & Plan Note (Signed)
Continue present blood pressure medications. 

## 2012-10-19 NOTE — Assessment & Plan Note (Signed)
Continue aspirin, Plavix and statin. He continues to have occasional symptoms that concern him. He feels they're more frequent than previous. Previous Myoview did show ischemia. Plan to proceed with cardiac catheterization. The risks and benefits were discussed and he agrees to proceed.

## 2012-10-19 NOTE — Progress Notes (Signed)
HPI: Pleasant male for followup of coronary artery disease. The patient had an inferoposterior myocardial infarction in 1992. He was managed medically. Last catheterization in October of 2005 showed an occluded right coronary artery with left to right collaterals. The left main was normal. There was a 50% circumflex and irregularities in the LAD. Attempt at PCI of the right coronary artery was unsuccessful. Myoview was performed in July of 2011. There was a small area of mild infarct and peri-infarct ischemia in the inferobasal wall. Ejection fraction was 65%. This was unchanged compared to 2005. He has been treated medically. Myoview repeated in January of 2014. Ejection fraction was 58%. There was a prior inferior infarct with peri-infarct ischemia. We are treating medically. Also with cerebrovascular disease followed by vascular surgery. I last saw him in Jan 2014. Since then, he continues to have occasional pain in his left shoulder and left upper arm. It occurs predominantly with vigorous activities. It occasionally occurs at rest. He believes it is more frequent than in the past but he has had these symptoms intermittently for 25 years. He is concerned and would like to proceed with cardiac catheterization as he feels his symptoms are different.   Current Outpatient Prescriptions  Medication Sig Dispense Refill  . amLODipine (NORVASC) 5 MG tablet Take 5 mg by mouth daily.        Marland Kitchen aspirin 81 MG tablet Take 81 mg by mouth daily.        . clopidogrel (PLAVIX) 75 MG tablet Take 75 mg by mouth daily.        . Coenzyme Q10 (CO Q 10) 100 MG CAPS Take by mouth daily.        Marland Kitchen doxazosin (CARDURA) 4 MG tablet Take 4 mg by mouth at bedtime.        . finasteride (PROSCAR) 5 MG tablet Take 5 mg by mouth daily.        . fish oil-omega-3 fatty acids 1000 MG capsule Take 1 g by mouth daily.        . fluticasone (VERAMYST) 27.5 MCG/SPRAY nasal spray Place 2 sprays into the nose as needed.       Marland Kitchen  GLUCOSAMINE-CHONDROITIN PO Take 1 tablet by mouth daily.       . isosorbide mononitrate (IMDUR) 30 MG 24 hr tablet Take 1 tablet (30 mg total) by mouth daily.  90 tablet  3  . latanoprost (XALATAN) 0.005 % ophthalmic solution Place 1 drop into both eyes daily.      Marland Kitchen losartan (COZAAR) 100 MG tablet Take 100 mg by mouth daily.        . meloxicam (MOBIC) 7.5 MG tablet Take 7.5 mg by mouth as needed.        . Plant Sterols and Stanols (CHOLEST OFF) 450 MG TABS Take by mouth as directed.        . pravastatin (PRAVACHOL) 80 MG tablet Take 80 mg by mouth daily.       No current facility-administered medications for this visit.     Past Medical History  Diagnosis Date  . Hyperlipidemia   . Hypertension   . Coronary artery disease   . Obesity   . Carotid artery occlusion   . 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 Radiculopathy     Past Surgical History  Procedure Laterality Date  . Hernia repair  1960    right hernia repair  . Appendectomy  1958  . Cardiac catheterization  05/2004  SHOWING 100% OCCLUDED DISTAL RIGHT CORONARY WITH  LEFT  TO RIGHT COLLATERALS, AS WELL AS ANTEGRADE FLOW, NORMAL LEFT MAIN, 50% NARROWING IN LEFT CIRCUMFLEX  WITH IRREGULARITIES IN THE LAD  . Coronary angioplasty      RIGHT CORONARY WERE UNSUCCESSFUL  . US echocardiography  01/03/2007    EF 55-60%  . Cardiovascular stress test  02/26/2010    EF 65%, SMALL AREA OF MILD INFARCT AND POSSIBLE PER-INFARCT ISCHEMIA IN THE INFEROBASAL WALL    History   Social History  . Marital Status: Married    Spouse Name: N/A    Number of Children: N/A  . Years of Education: N/A   Occupational History  . retired    Social History Main Topics  . Smoking status: Former Smoker -- 1.00 packs/day for 25 years    Types: Cigarettes    Quit date: 08/29/1990  . Smokeless tobacco: Not on file  . Alcohol Use: Yes     Comment: minimal  . Drug Use: No  . Sexually Active: Not on file   Other Topics Concern  . Not  on file   Social History Narrative   Positive HTN and Stroke among his parents who lived up until their late 80's    ROS: no fevers or chills, productive cough, hemoptysis, dysphasia, odynophagia, melena, hematochezia, dysuria, hematuria, rash, seizure activity, orthopnea, PND, pedal edema, claudication. Remaining systems are negative.  Physical Exam: Well-developed well-nourished in no acute distress.  Skin is warm and dry.  HEENT is normal.  Neck is supple.  Chest is clear to auscultation with normal expansion.  Cardiovascular exam is regular rate and rhythm.  Abdominal exam nontender or distended. No masses palpated. Extremities show no edema. neuro grossly intact

## 2012-10-19 NOTE — Patient Instructions (Addendum)
Your physician has requested that you have a cardiac catheterization. Cardiac catheterization is used to diagnose and/or treat various heart conditions. Doctors may recommend this procedure for a number of different reasons. The most common reason is to evaluate chest pain. Chest pain can be a symptom of coronary artery disease (CAD), and cardiac catheterization can show whether plaque is narrowing or blocking your heart's arteries. This procedure is also used to evaluate the valves, as well as measure the blood flow and oxygen levels in different parts of your heart. For further information please visit [email or web address] Please follow instruction sheet, as given.   Your physician recommends that you HAVE LAB WORK TODAY  A chest x-ray takes a picture of the organs and structures inside the chest, including the heart, lungs, and blood vessels. This test can show several things, including, whether the heart is enlarges; whether fluid is building up in the lungs; and whether pacemaker / defibrillator leads are still in place. AT [address] Corinda [place]ler

## 2012-10-19 NOTE — Assessment & Plan Note (Signed)
Continue statin. 

## 2012-10-22 ENCOUNTER — Telehealth: Payer: Self-pay | Admitting: Cardiology

## 2012-10-22 NOTE — Telephone Encounter (Signed)
HAD LONG DISCUSSION WITH PT'S WIFE NOT DAUGHTER RE PROCEDURE HAD QUESTIONS RE CATH AND IF  NEEDS INTERVENTION  WILL  IT BE DONE THAT DAY DISCUSSED WITH JODETTE BRILEY RN  RETURNED CALL TO WIFE AND ANSWERED ALL QUESTIONS .Zack Seal

## 2012-10-22 NOTE — Telephone Encounter (Signed)
New Problem     Pts daughter has some questions regarding cardiac cath procedure. Would like to speak to nurse.

## 2012-10-23 ENCOUNTER — Inpatient Hospital Stay (HOSPITAL_BASED_OUTPATIENT_CLINIC_OR_DEPARTMENT_OTHER)
Admission: RE | Admit: 2012-10-23 | Discharge: 2012-10-23 | Disposition: A | Discharge status: Home / Self Care | Payer: Medicare Other | Source: Ambulatory Visit | Attending: Cardiology | Admitting: Cardiology

## 2012-10-23 ENCOUNTER — Encounter (HOSPITAL_BASED_OUTPATIENT_CLINIC_OR_DEPARTMENT_OTHER)
Admission: RE | Disposition: A | Discharge status: Home / Self Care | Payer: Self-pay | Source: Ambulatory Visit | Attending: Cardiology

## 2012-10-23 DIAGNOSIS — I200 Unstable angina: Secondary | ICD-10-CM | POA: Insufficient documentation

## 2012-10-23 DIAGNOSIS — I2510 Atherosclerotic heart disease of native coronary artery without angina pectoris: Secondary | ICD-10-CM | POA: Insufficient documentation

## 2012-10-23 SURGERY — JV LEFT HEART CATHETERIZATION WITH CORONARY ANGIOGRAM

## 2012-10-23 MED ORDER — SODIUM CHLORIDE 0.9 % IJ SOLN: 3.0000 mL | Freq: Two times a day (BID) | INTRAMUSCULAR | Status: DC

## 2012-10-23 MED ORDER — SODIUM CHLORIDE 0.9 % IV SOLN: 1.0000 mL/kg/h | INTRAVENOUS | Status: DC

## 2012-10-23 MED ORDER — SODIUM CHLORIDE 0.9 % IV SOLN: 250.0000 mL | INTRAVENOUS | Status: DC | PRN

## 2012-10-23 MED ORDER — ASPIRIN 81 MG PO CHEW: 324.0000 mg | CHEWABLE_TABLET | ORAL | Status: DC

## 2012-10-23 MED ORDER — DIAZEPAM 2 MG PO TABS
2.0000 mg | ORAL_TABLET | ORAL | Status: AC
  Administered 2012-10-23: 5 mg via ORAL

## 2012-10-23 MED ORDER — SODIUM CHLORIDE 0.9 % IJ SOLN: 3.0000 mL | INTRAMUSCULAR | Status: DC | PRN

## 2012-10-23 NOTE — H&P (View-Only) (Signed)
 HPI: Pleasant male for followup of coronary artery disease. The patient had an inferoposterior myocardial infarction in 1992. He was managed medically. Last catheterization in October of 2005 showed an occluded right coronary artery with left to right collaterals. The left main was normal. There was a 50% circumflex and irregularities in the LAD. Attempt at PCI of the right coronary artery was unsuccessful. Myoview was performed in July of 2011. There was a small area of mild infarct and peri-infarct ischemia in the inferobasal wall. Ejection fraction was 65%. This was unchanged compared to 2005. He has been treated medically. Myoview repeated in January of 2014. Ejection fraction was 58%. There was a prior inferior infarct with peri-infarct ischemia. Also with cerebrovascular disease followed by vascular surgery. I last saw him in Jan 2014. Since then, his symptoms have improved. He denies dyspnea. He has some left arm discomfort with extreme exertion but much better.   Current Outpatient Prescriptions  Medication Sig Dispense Refill  . amLODipine (NORVASC) 5 MG tablet Take 5 mg by mouth daily.        . aspirin 81 MG tablet Take 81 mg by mouth daily.        . clopidogrel (PLAVIX) 75 MG tablet Take 75 mg by mouth daily.        . Coenzyme Q10 (CO Q 10) 100 MG CAPS Take by mouth daily.        . doxazosin (CARDURA) 4 MG tablet Take 4 mg by mouth at bedtime.        . finasteride (PROSCAR) 5 MG tablet Take 5 mg by mouth daily.        . fish oil-omega-3 fatty acids 1000 MG capsule Take 1 g by mouth daily.        . fluticasone (VERAMYST) 27.5 MCG/SPRAY nasal spray Place 2 sprays into the nose as needed.       . GLUCOSAMINE-CHONDROITIN PO Take 1 tablet by mouth daily.       . isosorbide mononitrate (IMDUR) 30 MG 24 hr tablet Take 1 tablet (30 mg total) by mouth daily.  90 tablet  3  . latanoprost (XALATAN) 0.005 % ophthalmic solution Place 1 drop into both eyes daily.      . losartan (COZAAR) 100 MG  tablet Take 100 mg by mouth daily.        . meloxicam (MOBIC) 7.5 MG tablet Take 7.5 mg by mouth as needed.        . Plant Sterols and Stanols (CHOLEST OFF) 450 MG TABS Take by mouth as directed.        . pravastatin (PRAVACHOL) 80 MG tablet Take 80 mg by mouth daily.         Past Medical History  Diagnosis Date  . Hyperlipidemia   . Hypertension   . Coronary artery disease   . Obesity   . Carotid artery occlusion   . 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 Radiculopathy     Past Surgical History  Procedure Date  . Hernia repair 1960    right hernia repair  . Appendectomy 1958  . Cardiac catheterization 05/2004    SHOWING 100% OCCLUDED DISTAL RIGHT CORONARY WITH  LEFT  TO RIGHT COLLATERALS, AS WELL AS ANTEGRADE FLOW, NORMAL LEFT MAIN, 50% NARROWING IN LEFT CIRCUMFLEX  WITH IRREGULARITIES IN THE LAD  . Coronary angioplasty     RIGHT CORONARY WERE UNSUCCESSFUL  . Us echocardiography 01/03/2007    EF 55-60%  . Cardiovascular stress test   02/26/2010    EF 65%, SMALL AREA OF MILD INFARCT AND POSSIBLE PER-INFARCT ISCHEMIA IN THE INFEROBASAL WALL    History   Social History  . Marital Status: Married    Spouse Name: N/A    Number of Children: N/A  . Years of Education: N/A   Occupational History  . retired    Social History Main Topics  . Smoking status: Former Smoker -- 1.0 packs/day for 25 years    Types: Cigarettes    Quit date: 08/29/1990  . Smokeless tobacco: Not on file  . Alcohol Use: Yes     Comment: minimal  . Drug Use: No  . Sexually Active: Not on file   Other Topics Concern  . Not on file   Social History Narrative   Positive HTN and Stroke among his parents who lived up until their late 80's    ROS: no fevers or chills, productive cough, hemoptysis, dysphasia, odynophagia, melena, hematochezia, dysuria, hematuria, rash, seizure activity, orthopnea, PND, pedal edema, claudication. Remaining systems are negative.  Physical Exam: Well-developed  well-nourished in no acute distress.  Skin is warm and dry.  HEENT is normal.  Neck is supple.  Chest is clear to auscultation with normal expansion.  Cardiovascular exam is regular rate and rhythm.  Abdominal exam nontender or distended. No masses palpated. Extremities show no edema. neuro grossly intact       

## 2012-10-23 NOTE — Interval H&P Note (Signed)
History and Physical Interval Note:  10/23/2012 8:55 AM  [name]  has presented today for surgery, with the diagnosis of cp  The various methods of treatment have been discussed with the patient and family. After consideration of risks, benefits and other options for treatment, the patient has consented to  Procedure(s): JV LEFT HEART CATHETERIZATION WITH CORONARY ANGIOGRAM (N/A) as a surgical intervention .  The patient's history has been reviewed, patient examined, no change in status, stable for surgery.  I have reviewed the patient's chart and labs.  Questions were answered to the patient's satisfaction.     Rollene Rotunda

## 2012-10-23 NOTE — OR Nursing (Signed)
Discharge instructions reviewed and signed, pt stated understanding, ambulated in hall without difficulty, site level 0, transported to wife's car via wheelchair 

## 2012-10-23 NOTE — CV Procedure (Signed)
  Cardiac Catheterization Procedure Note  Name: Curtis Tyler MRN: [id number] DOB: [birth year]  Procedure: Left Heart Cath, Selective Coronary Angiography, LV angiography  Indication:  Unstable angina, CAD  Procedural details: The right groin was prepped, draped, and anesthetized with 1% lidocaine. Using modified Seldinger technique, a 4 French sheath was introduced into the right femoral artery. Standard Judkins catheters were used for coronary angiography and left ventriculography. Catheter exchanges were performed over a guidewire. There were no immediate procedural complications. The patient was transferred to the post catheterization recovery area for further monitoring.  Procedural Findings:  Hemodynamics:     AO 157/13    LV 151/65   Coronary angiography:    Coronary dominance: Right  Left mainstem:   Severe calcification with diffuse 60% stenosis.    Left anterior descending (LAD):   The LAD is a large vessel wrapping the apex.  Proximal focal 60 - 70% stenosis.  Moderate long proximal and mid calcification with long proximal 40% stenosis.  Mid diagonal small to moderate sized with ostial 40%.    Left circumflex (LCx):  RI is small with proximal 30% stenosis.  The circumflex essentially consists of a large OM with ostial 60% calcified stenosis and mid long 25% stenosis  Right coronary artery (RCA):  Large vessel.  Diffuse mid and distal disease with occlusion before the PDA.  Left to right collateral flow.  A PDA is seen to fill via the collaterals and is moderate sized and free of disease.   Left ventriculography: Left ventricular systolic function demonstrates mild inferior hypokiensis, LVEF is estimated at 65%, there is no significant mitral regurgitation   Final Conclusions:  Moderate left main disease, moderate to severe proximal circumflex disease and occluded RCA.  Preserved LV function.    Recommendations:   Consider CABG if symptoms have been progressive and occuring  at rest.  His disease is slightly progressive in the LM, LAD and ostial circumflex compared with the previous catheterization.   Rollene Rotunda 10/23/2012, 8:57 AM

## 2012-10-23 NOTE — OR Nursing (Signed)
+  Allen's test right hand 

## 2012-10-23 NOTE — OR Nursing (Signed)
Meal served 

## 2012-10-23 NOTE — OR Nursing (Signed)
Tegaderm dressing applied, site level 0, bedrest began at [employer]

## 2012-10-23 NOTE — OR Nursing (Signed)
Dr Hochrein at bedside to discuss results and treatment plan with pt and family 

## 2012-11-21 ENCOUNTER — Ambulatory Visit (INDEPENDENT_AMBULATORY_CARE_PROVIDER_SITE_OTHER): Payer: Medicare Other | Admitting: Cardiology

## 2012-11-21 ENCOUNTER — Encounter: Payer: Self-pay | Admitting: Cardiology

## 2012-11-21 VITALS — BP 120/74 | HR 65 | Ht 63.0 in | Wt 190.8 lb

## 2012-11-21 DIAGNOSIS — I10 Essential (primary) hypertension: Secondary | ICD-10-CM

## 2012-11-21 DIAGNOSIS — I679 Cerebrovascular disease, unspecified: Secondary | ICD-10-CM

## 2012-11-21 DIAGNOSIS — I2510 Atherosclerotic heart disease of native coronary artery without angina pectoris: Secondary | ICD-10-CM

## 2012-11-21 MED ORDER — METOPROLOL SUCCINATE ER 25 MG PO TB24: 25.0000 mg | ORAL_TABLET | Freq: Every day | ORAL | Status: DC

## 2012-11-21 NOTE — Patient Instructions (Addendum)
Please stop your Imdur Increase you Metoprolol to 25 mg a day Continue all other medications as listed.  Follow up in 3 months with Dr Antoine Poche.

## 2012-11-21 NOTE — Progress Notes (Signed)
HPI The patient presents for evaluation of known coronary artery disease. He recently had a cardiac catheterization demonstrating left main 60% stenosis. The LAD had a proximal 60-70% stenosis. There was mid 40% stenosis. Diagonal is moderate size with 40% stenosis. The circumflex system included a large OM with 60% ostial stenosis the RCA was occluded before the PDA. The EF was 65%. However, we decided to manage him medically unless he had worsening symptoms. He returns for followup.  Since his catheterization he continued to have occasional chest pain. This is sporadic and unchanged from previous. He doesn't think that the Imdur has made a difference. He cannot bring this on with activity. It goes away spontaneously. He was able to do such things as travel extensively recently with some walking on the beach without bringing on any symptoms. He's had no palpitations, presyncope or syncope. He's had no PND or orthopnea.  No Known Allergies  Current Outpatient Prescriptions  Medication Sig Dispense Refill  . amLODipine (NORVASC) 5 MG tablet Take 5 mg by mouth daily.        Marland Kitchen aspirin 81 MG tablet Take 81 mg by mouth daily.        . clopidogrel (PLAVIX) 75 MG tablet Take 75 mg by mouth daily.        . Coenzyme Q10 (CO Q 10) 100 MG CAPS Take by mouth daily.        Marland Kitchen doxazosin (CARDURA) 4 MG tablet Take 4 mg by mouth at bedtime.        . finasteride (PROSCAR) 5 MG tablet Take 5 mg by mouth daily.        . fish oil-omega-3 fatty acids 1000 MG capsule Take 1 g by mouth daily.        . fluticasone (VERAMYST) 27.5 MCG/SPRAY nasal spray Place 2 sprays into the nose as needed.       Marland Kitchen GLUCOSAMINE-CHONDROITIN PO Take 1 tablet by mouth daily.       . isosorbide mononitrate (IMDUR) 30 MG 24 hr tablet Take 1 tablet (30 mg total) by mouth daily.  90 tablet  3  . latanoprost (XALATAN) 0.005 % ophthalmic solution Place 1 drop into both eyes daily.      Marland Kitchen losartan (COZAAR) 100 MG tablet Take 100 mg by mouth  daily.        . meloxicam (MOBIC) 7.5 MG tablet Take 7.5 mg by mouth as needed.        . metoprolol succinate (TOPROL-XL) 25 MG 24 hr tablet Take 25 mg by mouth daily. Takes 1/2 daily      . Plant Sterols and Stanols (CHOLEST OFF) 450 MG TABS Take by mouth as directed.        . pravastatin (PRAVACHOL) 80 MG tablet Take 80 mg by mouth daily.       No current facility-administered medications for this visit.    Past Medical History  Diagnosis Date  . Hyperlipidemia   . Hypertension   . Coronary artery disease   . Obesity   . Carotid artery occlusion   . 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 Radiculopathy     Past Surgical History  Procedure Laterality Date  . Hernia repair  1960    right hernia repair  . Appendectomy  1958  . Cardiac catheterization  05/2004    SHOWING 100% OCCLUDED DISTAL RIGHT CORONARY WITH  LEFT  TO RIGHT COLLATERALS, AS WELL AS ANTEGRADE FLOW, NORMAL LEFT MAIN, 50% NARROWING IN  LEFT CIRCUMFLEX  WITH IRREGULARITIES IN THE LAD  . Coronary angioplasty      RIGHT CORONARY WERE UNSUCCESSFUL  . US echocardiography  01/03/2007    EF 55-60%  . Cardiovascular stress test  02/26/2010    EF 65%, SMALL AREA OF MILD INFARCT AND POSSIBLE PER-INFARCT ISCHEMIA IN THE INFEROBASAL WALL    ROS:  Erectile dysfunction.  Otherwise as stated in the HPI and negative for all other systems.  PHYSICAL EXAM BP 120/74  Pulse 65  Ht 5\' 3"  (1.6 m)  Wt 190 lb 12.8 oz (86.546 kg)  BMI 33.81 kg/m2 GENERAL:  Well appearing HEENT:  Pupils equal round and reactive, fundi not visualized, oral mucosa unremarkable NECK:  No jugular venous distention, waveform within normal limits, carotid upstroke brisk and symmetric, no bruits, no thyromegaly LUNGS:  Clear to auscultation bilaterally BACK:  No CVA tenderness CHEST:  Unremarkable HEART:  PMI not displaced or sustained,S1 and S2 within normal limits, no S3, no S4, no clicks, no rubs, no murmurs ABD:  Flat, positive bowel sounds  normal in frequency in pitch, no bruits, no rebound, no guarding, no midline pulsatile mass, no hepatomegaly, no splenomegaly EXT:  2 plus pulses throughout, no edema, no cyanosis no clubbing SKIN:  No rashes no nodules   ASSESSMENT AND PLAN  CAD:  He would like to come off of the Imdur so that he can use Viagra.  I am not sure that he is having any ongoing angina.  I am not convinced that Imdur has made a difference clinically.  He will stop this and increase the Toprol XL to 25 mg daily.  He will let me know if he has increasing symptoms.   HTN:  The blood pressure is at target. No change in medications is indicated. We will continue with therapeutic lifestyle changes (TLC).  HYPERLIPIDEMIA:  This is followed by Daisy Floro, MD and he reports good control.  I will be happy to review these results.  Of note he had some leg pain on Crestor in the past.

## 2012-11-30 ENCOUNTER — Encounter: Payer: Self-pay | Admitting: Cardiology

## 2012-12-26 ENCOUNTER — Encounter: Payer: Self-pay | Admitting: Podiatry

## 2012-12-26 ENCOUNTER — Ambulatory Visit: Payer: Self-pay | Admitting: Podiatry

## 2013-01-02 ENCOUNTER — Ambulatory Visit: Payer: Self-pay | Admitting: Podiatry

## 2013-01-30 ENCOUNTER — Encounter: Payer: Self-pay | Admitting: Cardiology

## 2013-01-30 ENCOUNTER — Ambulatory Visit (INDEPENDENT_AMBULATORY_CARE_PROVIDER_SITE_OTHER): Payer: Medicare Other | Admitting: Cardiology

## 2013-01-30 VITALS — BP 100/60 | HR 68 | Ht 63.0 in | Wt 186.6 lb

## 2013-01-30 DIAGNOSIS — I679 Cerebrovascular disease, unspecified: Secondary | ICD-10-CM

## 2013-01-30 DIAGNOSIS — I2510 Atherosclerotic heart disease of native coronary artery without angina pectoris: Secondary | ICD-10-CM

## 2013-01-30 NOTE — Progress Notes (Signed)
HPI The patient presents for evaluation of known coronary artery disease. He recently had a cardiac catheterization demonstrating left main 60% stenosis. The LAD had a proximal 60-70% stenosis. There was mid 40% stenosis. Diagonal is moderate size with 40% stenosis. The circumflex system included a large OM with 60% ostial stenosis the RCA was occluded before the PDA. The EF was 65%. However, we decided to manage him medically unless he had worsening symptoms. He returns for followup.  I did stop his Imdur at the last visit and increased his beta blocker. He wanted to use Viagra.  The patient denies any new symptoms such as chest discomfort, neck or arm discomfort. There has been no new shortness of breath, PND or orthopnea. There have been no reported palpitations, presyncope or syncope.  He is active doing walking. With this he has no symptoms.  No Known Allergies  Current Outpatient Prescriptions  Medication Sig Dispense Refill  . amLODipine (NORVASC) 5 MG tablet Take 5 mg by mouth daily.        Marland Kitchen aspirin 81 MG tablet Take 81 mg by mouth daily.        . clopidogrel (PLAVIX) 75 MG tablet Take 75 mg by mouth daily.        . Coenzyme Q10 (CO Q 10) 100 MG CAPS Take by mouth daily.        Marland Kitchen doxazosin (CARDURA) 4 MG tablet Take 4 mg by mouth at bedtime.        . finasteride (PROSCAR) 5 MG tablet Take 5 mg by mouth daily.        . fish oil-omega-3 fatty acids 1000 MG capsule Take 1 g by mouth daily.        . fluticasone (VERAMYST) 27.5 MCG/SPRAY nasal spray Place 2 sprays into the nose as needed.       Marland Kitchen GLUCOSAMINE-CHONDROITIN PO Take 1 tablet by mouth daily.       Marland Kitchen latanoprost (XALATAN) 0.005 % ophthalmic solution Place 1 drop into both eyes daily.      Marland Kitchen losartan (COZAAR) 100 MG tablet Take 100 mg by mouth daily.        . meloxicam (MOBIC) 7.5 MG tablet Take 7.5 mg by mouth as needed.        . metoprolol succinate (TOPROL-XL) 25 MG 24 hr tablet Take 1 tablet (25 mg total) by mouth daily.  30  tablet  3  . Plant Sterols and Stanols (CHOLEST OFF) 450 MG TABS Take by mouth as directed.        . pravastatin (PRAVACHOL) 80 MG tablet Take 80 mg by mouth daily.       No current facility-administered medications for this visit.    Past Medical History  Diagnosis Date  . Hyperlipidemia   . Hypertension   . Coronary artery disease   . Obesity   . Carotid artery occlusion   . 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 Radiculopathy   . Bunion   . Callus     Past Surgical History  Procedure Laterality Date  . Hernia repair  1960    right hernia repair  . Appendectomy  1958  . Cardiac catheterization  05/2004    SHOWING 100% OCCLUDED DISTAL RIGHT CORONARY WITH  LEFT  TO RIGHT COLLATERALS, AS WELL AS ANTEGRADE FLOW, NORMAL LEFT MAIN, 50% NARROWING IN LEFT CIRCUMFLEX  WITH IRREGULARITIES IN THE LAD  . Coronary angioplasty      RIGHT CORONARY WERE UNSUCCESSFUL  .  US echocardiography  01/03/2007    EF 55-60%  . Cardiovascular stress test  02/26/2010    EF 65%, SMALL AREA OF MILD INFARCT AND POSSIBLE PER-INFARCT ISCHEMIA IN THE INFEROBASAL WALL    ROS:  Erectile dysfunction.  Otherwise as stated in the HPI and negative for all other systems.  PHYSICAL EXAM BP 100/60  Pulse 68  Ht 5\' 3"  (1.6 m)  Wt 186 lb 9.6 oz (84.641 kg)  BMI 33.06 kg/m2 GENERAL:  Well appearing NECK:  No jugular venous distention, waveform within normal limits, carotid upstroke brisk and symmetric, no bruits, no thyromegaly LUNGS:  Clear to auscultation bilaterally CHEST:  Unremarkable HEART:  PMI not displaced or sustained,S1 and S2 within normal limits, no S3, no S4, no clicks, no rubs, no murmurs ABD:  Flat, positive bowel sounds normal in frequency in pitch, no bruits, no rebound, no guarding, no midline pulsatile mass, no hepatomegaly, no splenomegaly EXT:  2 plus pulses throughout, no edema, no cyanosis no clubbing  EKG:  I sinus rhythm, rate 68, leftward axis, first degree AV block, no acute  ST-T wave changes.  ASSESSMENT AND PLAN  CAD:  The patient has no new sypmtoms.  No further cardiovascular testing is indicated.  We will continue with aggressive risk reduction and meds as listed.  HTN:  The blood pressure is at target. No change in medications is indicated. We will continue with therapeutic lifestyle changes (TLC).  HYPERLIPIDEMIA:  This is followed by Daisy Floro, MD and he reports good control.

## 2013-01-30 NOTE — Patient Instructions (Addendum)
The current medical regimen is effective;  continue present plan and medications.  Follow up in 1 year with Dr Hochrein.  You will receive a letter in the mail 2 months before you are due.  Please call us when you receive this letter to schedule your follow up appointment.  

## 2013-07-22 ENCOUNTER — Ambulatory Visit (INDEPENDENT_AMBULATORY_CARE_PROVIDER_SITE_OTHER): Payer: Medicare Other | Admitting: Cardiology

## 2013-07-22 ENCOUNTER — Encounter: Payer: Self-pay | Admitting: Cardiology

## 2013-07-22 VITALS — BP 152/78 | HR 64 | Ht 62.5 in | Wt 188.0 lb

## 2013-07-22 DIAGNOSIS — I679 Cerebrovascular disease, unspecified: Secondary | ICD-10-CM

## 2013-07-22 DIAGNOSIS — I2510 Atherosclerotic heart disease of native coronary artery without angina pectoris: Secondary | ICD-10-CM

## 2013-07-22 NOTE — Patient Instructions (Signed)
Your physician wants you to follow-up in: 7-8 months.  You will receive a reminder letter in the mail two months in advance. If you don't receive a letter, please call our office to schedule the follow-up appointment.  Your physician has recommended you make the following change in your medication: NO CHANGE

## 2013-07-22 NOTE — Progress Notes (Signed)
HPI The patient presents for evaluation of known coronary artery disease. He recently had a cardiac catheterization demonstrating left main 60% stenosis. The LAD had a proximal 60-70% stenosis. There was mid 40% stenosis. Diagonal is moderate size with 40% stenosis. The circumflex system included a large OM with 60% ostial stenosis the RCA was occluded before the PDA. The EF was 65%. However, we decided to manage him medically unless he had worsening symptoms.    He was in in an argument recently with his wife. Following that his blood pressure shot up. He took his medicine and his blood pressure actually came down a little too low. He was then having some labile readings and in particular had some low diastolics. He started having some mild headaches. He was given an increased dose of Norvasc which he took for a couple of days but then stopped a higher dose. He still having some headaches but his blood pressure has normalized. He's otherwise feeling well since he made this appointment. He's been having some fleeting tingling chest discomfort but no substernal pressure. He's not been having any visual or motor disturbances. His new exercise without bringing on symptoms. Her blood pressure now seems to have normalized.  No Known Allergies  Current Outpatient Prescriptions  Medication Sig Dispense Refill  . amLODipine (NORVASC) 5 MG tablet Take 5 mg by mouth daily.        Marland Kitchen aspirin 81 MG tablet Take 81 mg by mouth daily.        . clopidogrel (PLAVIX) 75 MG tablet Take 75 mg by mouth daily.        . Coenzyme Q10 (CO Q 10) 100 MG CAPS Take by mouth daily.        Marland Kitchen doxazosin (CARDURA) 4 MG tablet Take 4 mg by mouth at bedtime.        . finasteride (PROSCAR) 5 MG tablet Take 5 mg by mouth daily.        . fish oil-omega-3 fatty acids 1000 MG capsule Take 1 g by mouth daily.        . fluticasone (VERAMYST) 27.5 MCG/SPRAY nasal spray Place 2 sprays into the nose as needed.       Marland Kitchen GLUCOSAMINE-CHONDROITIN  PO Take 1 tablet by mouth daily.       Marland Kitchen latanoprost (XALATAN) 0.005 % ophthalmic solution Place 1 drop into both eyes daily.      Marland Kitchen losartan (COZAAR) 100 MG tablet Take 100 mg by mouth daily.        . meloxicam (MOBIC) 7.5 MG tablet Take 7.5 mg by mouth as needed.        . metoprolol succinate (TOPROL-XL) 25 MG 24 hr tablet Take 1 tablet (25 mg total) by mouth daily.  30 tablet  3  . Plant Sterols and Stanols (CHOLEST OFF) 450 MG TABS Take by mouth as directed.        . pravastatin (PRAVACHOL) 80 MG tablet Take 80 mg by mouth daily.       No current facility-administered medications for this visit.    Past Medical History  Diagnosis Date  . Hyperlipidemia   . Hypertension   . Coronary artery disease   . Obesity   . Carotid artery occlusion   . 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 Radiculopathy   . Bunion   . Callus     Past Surgical History  Procedure Laterality Date  . Hernia repair  1960    right  hernia repair  . Appendectomy  1958  . Cardiac catheterization  05/2004    SHOWING 100% OCCLUDED DISTAL RIGHT CORONARY WITH  LEFT  TO RIGHT COLLATERALS, AS WELL AS ANTEGRADE FLOW, NORMAL LEFT MAIN, 50% NARROWING IN LEFT CIRCUMFLEX  WITH IRREGULARITIES IN THE LAD  . Coronary angioplasty      RIGHT CORONARY WERE UNSUCCESSFUL  . US echocardiography  01/03/2007    EF 55-60%  . Cardiovascular stress test  02/26/2010    EF 65%, SMALL AREA OF MILD INFARCT AND POSSIBLE PER-INFARCT ISCHEMIA IN THE INFEROBASAL WALL    ROS:  Erectile dysfunction.  Otherwise as stated in the HPI and negative for all other systems.  PHYSICAL EXAM BP 152/78  Pulse 64  Ht 5' 2.5" (1.588 m)  Wt 188 lb (85.276 kg)  BMI 33.82 kg/m2 GENERAL:  Well appearing NECK:  No jugular venous distention, waveform within normal limits, carotid upstroke brisk and symmetric, no bruits, no thyromegaly LUNGS:  Clear to auscultation bilaterally CHEST:  Unremarkable HEART:  PMI not displaced or sustained,S1 and S2  within normal limits, no S3, no S4, no clicks, no rubs, no murmurs ABD:  Flat, positive bowel sounds normal in frequency in pitch, no bruits, no rebound, no guarding, no midline pulsatile mass, no hepatomegaly, no splenomegaly EXT:  2 plus pulses throughout, no edema, no cyanosis no clubbing  EKG:  Sinus rhythm, rate 64, leftward axis, first degree AV block, no acute ST-T wave changes.  07/22/2013  ASSESSMENT AND PLAN  CAD:  The patient has no new sypmtoms consistent with angina.  No further cardiovascular testing is indicated.  We will continue with aggressive risk reduction and meds as listed.  HTN:  The blood pressure is at target. Despite the recent fluctuations at this time I do not think that any change in medications is indicated. We will continue with therapeutic lifestyle changes (TLC).  HYPERLIPIDEMIA:  This is followed by  Duane Lope, MD and he reports good control.

## 2013-08-20 ENCOUNTER — Ambulatory Visit (INDEPENDENT_AMBULATORY_CARE_PROVIDER_SITE_OTHER): Payer: Medicare Other | Admitting: Diagnostic Neuroimaging

## 2013-08-20 ENCOUNTER — Encounter: Payer: Self-pay | Admitting: Diagnostic Neuroimaging

## 2013-08-20 VITALS — BP 128/73 | HR 70 | Temp 97.2°F | Ht 62.5 in | Wt 194.0 lb

## 2013-08-20 DIAGNOSIS — R519 Headache, unspecified: Secondary | ICD-10-CM

## 2013-08-20 DIAGNOSIS — R51 Headache: Secondary | ICD-10-CM

## 2013-08-20 NOTE — Patient Instructions (Signed)
Drink plenty of water.  Monitor your food, caffeine, alcohol intake for possible triggering factors.

## 2013-08-20 NOTE — Progress Notes (Signed)
[facility]  PATIENT: Curtis Tyler DOB: 05-31-1941  REFERRING CLINICIAN: Tenny Craw HISTORY FROM: patient and wife REASON FOR VISIT: new consult   HISTORICAL  CHIEF COMPLAINT:  Chief Complaint  Patient presents with  . Headache    HISTORY OF PRESENT ILLNESS:   72 year old ambidextous male with HTN, hypercholesterolemia, heart disease, here for evaluation of headaches.  This is a new onset of generalized headache, associated with labile blood pressure. This developed with gradual onset. Headache is there on a daily basis with nausea, tired feeling, and variation in severity throughout the day. When patient wakes up he does not have headache. The is worsens later in the morning, worse in the afternoon but then improves in the evening. Ranges from 3-6 in severity. No prior history of headaches. No family history of migraine. No triggering factors.  Also patient having some balance difficulty when he stands up, feels that these of all.  REVIEW OF SYSTEMS: Full 14 system review of systems performed and notable only for confusion headache dizziness aching muscles allergies.  ALLERGIES: No Known Allergies  HOME MEDICATIONS: Outpatient Prescriptions Prior to Visit  Medication Sig Dispense Refill  . amLODipine (NORVASC) 5 MG tablet Take 5 mg by mouth daily.        . clopidogrel (PLAVIX) 75 MG tablet Take 75 mg by mouth daily.        . Coenzyme Q10 (CO Q 10) 100 MG CAPS Take by mouth daily.        Marland Kitchen doxazosin (CARDURA) 4 MG tablet Take 4 mg by mouth at bedtime.        . finasteride (PROSCAR) 5 MG tablet Take 5 mg by mouth daily.        . fish oil-omega-3 fatty acids 1000 MG capsule Take 1 g by mouth daily.        Marland Kitchen GLUCOSAMINE-CHONDROITIN PO Take 1 tablet by mouth daily.       Marland Kitchen latanoprost (XALATAN) 0.005 % ophthalmic solution Place 1 drop into both eyes daily.      Marland Kitchen losartan (COZAAR) 100 MG tablet Take 100 mg by mouth daily.        . meloxicam (MOBIC) 7.5 MG tablet  Take 7.5 mg by mouth as needed.        . metoprolol succinate (TOPROL-XL) 25 MG 24 hr tablet Take 1 tablet (25 mg total) by mouth daily.  30 tablet  3  . Plant Sterols and Stanols (CHOLEST OFF) 450 MG TABS Take by mouth as directed.        . pravastatin (PRAVACHOL) 80 MG tablet Take 80 mg by mouth daily.      Marland Kitchen aspirin 81 MG tablet Take 81 mg by mouth daily.        . fluticasone (VERAMYST) 27.5 MCG/SPRAY nasal spray Place 2 sprays into the nose as needed.        No facility-administered medications prior to visit.    PAST MEDICAL HISTORY: Past Medical History  Diagnosis Date  . Hyperlipidemia   . Hypertension   . Coronary artery disease   . Obesity   . Carotid artery occlusion   . 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 Radiculopathy   . Bunion   . Callus     PAST SURGICAL HISTORY: Past Surgical History  Procedure Laterality Date  . Hernia repair  1960    right hernia repair  . Appendectomy  1958  . Cardiac catheterization  05/2004    SHOWING 100% OCCLUDED  DISTAL RIGHT CORONARY WITH  LEFT  TO RIGHT COLLATERALS, AS WELL AS ANTEGRADE FLOW, NORMAL LEFT MAIN, 50% NARROWING IN LEFT CIRCUMFLEX  WITH IRREGULARITIES IN THE LAD  . Coronary angioplasty      RIGHT CORONARY WERE UNSUCCESSFUL  . US echocardiography  01/03/2007    EF 55-60%  . Cardiovascular stress test  02/26/2010    EF 65%, SMALL AREA OF MILD INFARCT AND POSSIBLE PER-INFARCT ISCHEMIA IN THE INFEROBASAL WALL    FAMILY HISTORY: Family History  Problem Relation Age of Onset  . Hypertension Neg Hx   . Stroke Father     SOCIAL HISTORY:  History   Social History  . Marital Status: Married    Spouse Name: N/A    Number of Children: N/A  . Years of Education: N/A   Occupational History  . retired    Social History Main Topics  . Smoking status: Former Smoker -- 1.00 packs/day for 25 years    Types: Cigarettes    Quit date: 08/29/1990  . Smokeless tobacco: Not on file  . Alcohol Use: Yes      Comment: minimal  . Drug Use: No  . Sexual Activity: Not on file   Other Topics Concern  . Not on file   Social History Narrative   Positive HTN and Stroke among his parents who lived up until their late 71's     PHYSICAL EXAM  Filed Vitals:   08/20/13 0951  BP: 128/73  Pulse: 70  Temp: 97.2 F (36.2 C)  TempSrc: Oral  Height: 5' 2.5" (1.588 m)  Weight: 194 lb (87.998 kg)    Not recorded    Body mass index is 34.9 kg/(m^2).  GENERAL EXAM: Patient is in no distress; well developed, nourished and groomed; neck is supple  CARDIOVASCULAR: Regular rate and rhythm, no murmurs, no carotid bruits  NEUROLOGIC: MENTAL STATUS: awake, alert, oriented to person, place and time, recent and remote memory intact, normal attention and concentration, language fluent, comprehension intact, naming intact, fund of knowledge appropriate CRANIAL NERVE: no papilledema on fundoscopic exam, pupils equal and reactive to light, visual fields full to confrontation, extraocular muscles intact, no nystagmus, facial sensation and strength symmetric, hearing intact, palate elevates symmetrically, uvula midline, shoulder shrug symmetric, tongue midline. MOTOR: normal bulk and tone, full strength in the BUE, BLE SENSORY: normal and symmetric to light touch, temperature, vibration COORDINATION: finger-nose-finger, fine finger movements, heel-shin normal REFLEXES: deep tendon reflexes present and symmetric GAIT/STATION: narrow based gait; able to walk on toes, heels and tandem; romberg is negative    DIAGNOSTIC DATA (LABS, IMAGING, TESTING) - I reviewed patient records, labs, notes, testing and imaging myself where available.  Lab Results  Component Value Date   WBC 5.9 10/19/2012   HGB 15.1 10/19/2012   HCT 44.7 10/19/2012   MCV 93.0 10/19/2012   PLT 183.0 10/19/2012      Component Value Date/Time   NA 136 10/19/2012 0952   K 4.1 10/19/2012 0952   CL 103 10/19/2012 0952   CO2 28 10/19/2012 0952    GLUCOSE 94 10/19/2012 0952   BUN 16 10/19/2012 0952   CREATININE 0.9 10/19/2012 0952   CALCIUM 9.0 10/19/2012 0952   No results found for this basename: CHOL, HDL, LDLCALC, LDLDIRECT, TRIG, CHOLHDL   No results found for this basename: HGBA1C   No results found for this basename: VITAMINB12   No results found for this basename: TSH    ESR 8    ASSESSMENT AND PLAN  72 y.o. year old male here with new onset headache one month ago with nonspecific features. We'll check neuroimaging study to evaluate for secondary causes.   Ddx: secondary HA (vascular, structural) vs tension HA  PLAN: - MRI brain - OTC tylenol or NSAIDs - stay hydrated - moderation in caffeine and alcohol use   Orders Placed This Encounter  Procedures  . MR Brain W Wo Contrast    Return in about 3 months (around 11/18/2013) for with Heide Guile or Penumalli.    Suanne Marker, MD 08/20/2013, 10:03 AM Certified in Neurology, Neurophysiology and Neuroimaging  [facility] [address] [place], [place] [address] [phone]

## 2013-08-27 ENCOUNTER — Telehealth: Payer: Self-pay | Admitting: Diagnostic Neuroimaging

## 2013-08-27 NOTE — Telephone Encounter (Signed)
Informed patient that MRI has been ordered 08/20/13. He probably won't here anything until next week since its the holidays.

## 2013-08-27 NOTE — Telephone Encounter (Signed)
Patient requests information regarding his MRI scheduling. He has not heard from anyone and wishes this done as soon as possible.

## 2013-09-09 ENCOUNTER — Ambulatory Visit
Admission: RE | Admit: 2013-09-09 | Discharge: 2013-09-09 | Disposition: A | Discharge status: Home / Self Care | Payer: Managed Care, Other (non HMO) | Source: Ambulatory Visit | Attending: Diagnostic Neuroimaging | Admitting: Diagnostic Neuroimaging

## 2013-09-09 DIAGNOSIS — R51 Headache: Principal | ICD-10-CM

## 2013-09-09 DIAGNOSIS — R519 Headache, unspecified: Secondary | ICD-10-CM

## 2013-09-09 MED ORDER — GADOBENATE DIMEGLUMINE 529 MG/ML IV SOLN
18.0000 mL | Freq: Once | INTRAVENOUS | Status: AC | PRN
  Administered 2013-09-09: 18 mL via INTRAVENOUS

## 2013-09-12 ENCOUNTER — Telehealth: Payer: Self-pay | Admitting: *Deleted

## 2013-09-12 NOTE — Telephone Encounter (Signed)
Call pt with normal MRI results.

## 2013-09-12 NOTE — Telephone Encounter (Signed)
Please call patient with normal MRI results. -VRP

## 2013-09-12 NOTE — Telephone Encounter (Signed)
Called patient concerning his MRI results being normal. Patient stated that he knows that he has an appt. On 11/22/13, but would like to come in sooner to discuss his headaches and what can be done. He was just seen on 08/20/13. Please advise.

## 2013-09-13 NOTE — Telephone Encounter (Signed)
Called patient and shared normal results per Dr Gladstone Lighter note

## 2013-11-18 ENCOUNTER — Ambulatory Visit: Payer: Medicare Other | Admitting: Nurse Practitioner

## 2013-11-22 ENCOUNTER — Ambulatory Visit: Payer: Medicare Other | Admitting: Nurse Practitioner

## 2014-01-24 ENCOUNTER — Encounter: Payer: Self-pay | Admitting: Cardiology

## 2014-02-10 ENCOUNTER — Ambulatory Visit: Payer: Medicare Other | Admitting: Cardiology

## 2014-03-27 ENCOUNTER — Ambulatory Visit (INDEPENDENT_AMBULATORY_CARE_PROVIDER_SITE_OTHER): Payer: Managed Care, Other (non HMO) | Admitting: Cardiology

## 2014-03-27 ENCOUNTER — Encounter: Payer: Self-pay | Admitting: Cardiology

## 2014-03-27 VITALS — BP 140/80 | HR 61 | Ht 62.0 in | Wt 193.0 lb

## 2014-03-27 DIAGNOSIS — I2510 Atherosclerotic heart disease of native coronary artery without angina pectoris: Secondary | ICD-10-CM

## 2014-03-27 DIAGNOSIS — I658 Occlusion and stenosis of other precerebral arteries: Secondary | ICD-10-CM

## 2014-03-27 DIAGNOSIS — I10 Essential (primary) hypertension: Secondary | ICD-10-CM

## 2014-03-27 DIAGNOSIS — I6523 Occlusion and stenosis of bilateral carotid arteries: Secondary | ICD-10-CM

## 2014-03-27 DIAGNOSIS — I6529 Occlusion and stenosis of unspecified carotid artery: Secondary | ICD-10-CM

## 2014-03-27 NOTE — Patient Instructions (Signed)
We are scheduling a carotid doppler study  Your physician recommends that you schedule a follow-up appointment in: one year with Dr. Percival Spanish

## 2014-03-27 NOTE — Progress Notes (Signed)
HPI The patient presents for evaluation of known coronary artery disease. He recently had a cardiac catheterization demonstrating left main 60% stenosis. The LAD had a proximal 60-70% stenosis. There was mid 40% stenosis. Diagonal is moderate size with 40% stenosis. The circumflex system included a large OM with 60% ostial stenosis the RCA was occluded before the PDA. The EF was 65%. However, we decided to manage him medically unless he had worsening symptoms.    Since her last saw him he has had neuro evaluation of headaches. MRI was unremarkable. He subsequently took himself off of low-dose beta blocker and he has felt better. He is exercising. The patient denies any new symptoms such as chest discomfort, neck or arm discomfort. There has been no new shortness of breath, PND or orthopnea. There have been no reported palpitations, presyncope or syncope.  No Known Allergies  Current Outpatient Prescriptions  Medication Sig Dispense Refill  . amLODipine (NORVASC) 5 MG tablet Take 5 mg by mouth daily.        Marland Kitchen aspirin 81 MG tablet Take 81 mg by mouth daily.        . clopidogrel (PLAVIX) 75 MG tablet Take 75 mg by mouth daily.        . Coenzyme Q10 (CO Q 10) 100 MG CAPS Take by mouth daily.        . CRESTOR 10 MG tablet       . doxazosin (CARDURA) 4 MG tablet Take 4 mg by mouth at bedtime.        . finasteride (PROSCAR) 5 MG tablet Take 5 mg by mouth daily.        . fish oil-omega-3 fatty acids 1000 MG capsule Take 1 g by mouth daily.        Marland Kitchen GLUCOSAMINE-CHONDROITIN PO Take 1 tablet by mouth daily.       Marland Kitchen latanoprost (XALATAN) 0.005 % ophthalmic solution Place 1 drop into both eyes daily.      Marland Kitchen losartan (COZAAR) 100 MG tablet Take 100 mg by mouth daily.        . meloxicam (MOBIC) 7.5 MG tablet Take 7.5 mg by mouth as needed.        . Plant Sterols and Stanols (CHOLEST OFF) 450 MG TABS Take by mouth as directed.        . pravastatin (PRAVACHOL) 80 MG tablet Take 80 mg by mouth daily.      .  tadalafil (CIALIS) 10 MG tablet Take 10 mg by mouth daily as needed for erectile dysfunction.       No current facility-administered medications for this visit.    Past Medical History  Diagnosis Date  . Hyperlipidemia   . Hypertension   . Coronary artery disease   . Obesity   . Carotid artery occlusion   . 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 Radiculopathy   . Bunion   . Callus     Past Surgical History  Procedure Laterality Date  . Hernia repair  1960    right hernia repair  . Appendectomy  1958  . Cardiac catheterization  05/2004    SHOWING 100% OCCLUDED DISTAL RIGHT CORONARY WITH  LEFT  TO RIGHT COLLATERALS, AS WELL AS ANTEGRADE FLOW, NORMAL LEFT MAIN, 50% NARROWING IN LEFT CIRCUMFLEX  WITH IRREGULARITIES IN THE LAD  . Coronary angioplasty      RIGHT CORONARY WERE UNSUCCESSFUL  . US echocardiography  01/03/2007    EF 55-60%  . Cardiovascular stress  test  02/26/2010    EF 65%, SMALL AREA OF MILD INFARCT AND POSSIBLE PER-INFARCT ISCHEMIA IN THE INFEROBASAL WALL    ROS:  Erectile dysfunction.  Otherwise as stated in the HPI and negative for all other systems.  PHYSICAL EXAM BP 140/80  Pulse 61  Ht 5\' 2"  (1.575 m)  Wt 193 lb (87.544 kg)  BMI 35.29 kg/m2 GENERAL:  Well appearing NECK:  No jugular venous distention, waveform within normal limits, carotid upstroke brisk and symmetric, no bruits, no thyromegaly LUNGS:  Clear to auscultation bilaterally CHEST:  Unremarkable HEART:  PMI not displaced or sustained,S1 and S2 within normal limits, no S3, no S4, no clicks, no rubs, no murmurs ABD:  Flat, positive bowel sounds normal in frequency in pitch, no bruits, no rebound, no guarding, no midline pulsatile mass, no hepatomegaly, no splenomegaly EXT:  2 plus pulses throughout, no edema, no cyanosis no clubbing  EKG:  Sinus rhythm, rate 61, first degree AV block, no acute ST-T wave changes.  03/27/2014  ASSESSMENT AND PLAN  CAD:  The patient has no new sypmtoms  consistent with angina.  No further cardiovascular testing is indicated.  We will continue with aggressive risk reduction and meds as listed.  HTN:  The blood pressure is at target. Despite the recent fluctuations at this time I do not think that any change in medications is indicated. We will continue with therapeutic lifestyle changes (TLC).  HYPERLIPIDEMIA:  This is followed by his primary provider.  The LDL was 76 with and HDL of 58  CAROTID STENOSIS:  He is due for follow up of his moderate stenosis.

## 2014-04-15 ENCOUNTER — Ambulatory Visit (HOSPITAL_COMMUNITY)
Admission: RE | Admit: 2014-04-15 | Discharge: 2014-04-15 | Disposition: A | Discharge status: Home / Self Care | Payer: Medicare HMO | Source: Ambulatory Visit | Attending: Cardiology | Admitting: Cardiology

## 2014-04-15 DIAGNOSIS — I6523 Occlusion and stenosis of bilateral carotid arteries: Secondary | ICD-10-CM

## 2014-04-15 DIAGNOSIS — I658 Occlusion and stenosis of other precerebral arteries: Secondary | ICD-10-CM | POA: Diagnosis not present

## 2014-04-15 DIAGNOSIS — I6529 Occlusion and stenosis of unspecified carotid artery: Secondary | ICD-10-CM

## 2014-04-15 DIAGNOSIS — M19019 Primary osteoarthritis, unspecified shoulder: Secondary | ICD-10-CM | POA: Insufficient documentation

## 2014-04-15 NOTE — Progress Notes (Signed)
Carotid Duplex Completed. Aidenjames Heckmann, BS, RDMS, RVT  

## 2014-04-23 ENCOUNTER — Ambulatory Visit: Payer: Medicare HMO | Admitting: Physical Therapy

## 2014-07-15 DIAGNOSIS — M25462 Effusion, left knee: Secondary | ICD-10-CM | POA: Insufficient documentation

## 2014-07-15 DIAGNOSIS — N401 Enlarged prostate with lower urinary tract symptoms: Secondary | ICD-10-CM | POA: Insufficient documentation

## 2014-07-15 DIAGNOSIS — E291 Testicular hypofunction: Secondary | ICD-10-CM | POA: Insufficient documentation

## 2014-07-15 DIAGNOSIS — J343 Hypertrophy of nasal turbinates: Secondary | ICD-10-CM | POA: Insufficient documentation

## 2014-07-15 DIAGNOSIS — R339 Retention of urine, unspecified: Secondary | ICD-10-CM | POA: Insufficient documentation

## 2014-07-15 DIAGNOSIS — M25562 Pain in left knee: Secondary | ICD-10-CM | POA: Insufficient documentation

## 2014-07-15 DIAGNOSIS — Z87898 Personal history of other specified conditions: Secondary | ICD-10-CM | POA: Insufficient documentation

## 2014-07-15 DIAGNOSIS — R350 Frequency of micturition: Secondary | ICD-10-CM | POA: Insufficient documentation

## 2014-07-21 DIAGNOSIS — I6523 Occlusion and stenosis of bilateral carotid arteries: Secondary | ICD-10-CM | POA: Insufficient documentation

## 2014-07-21 DIAGNOSIS — M791 Myalgia, unspecified site: Secondary | ICD-10-CM | POA: Insufficient documentation

## 2014-09-03 DIAGNOSIS — M25551 Pain in right hip: Secondary | ICD-10-CM | POA: Insufficient documentation

## 2014-09-03 DIAGNOSIS — M25552 Pain in left hip: Secondary | ICD-10-CM | POA: Insufficient documentation

## 2014-09-03 DIAGNOSIS — M5440 Lumbago with sciatica, unspecified side: Secondary | ICD-10-CM | POA: Insufficient documentation

## 2014-10-07 ENCOUNTER — Emergency Department (HOSPITAL_COMMUNITY)
Admission: EM | Admit: 2014-10-07 | Discharge: 2014-10-07 | Disposition: A | Discharge status: Home / Self Care | Payer: PPO | Attending: Emergency Medicine | Admitting: Emergency Medicine

## 2014-10-07 ENCOUNTER — Encounter (HOSPITAL_COMMUNITY): Payer: Self-pay | Admitting: *Deleted

## 2014-10-07 DIAGNOSIS — I2510 Atherosclerotic heart disease of native coronary artery without angina pectoris: Secondary | ICD-10-CM | POA: Insufficient documentation

## 2014-10-07 DIAGNOSIS — Z9889 Other specified postprocedural states: Secondary | ICD-10-CM | POA: Diagnosis not present

## 2014-10-07 DIAGNOSIS — Z79899 Other long term (current) drug therapy: Secondary | ICD-10-CM | POA: Insufficient documentation

## 2014-10-07 DIAGNOSIS — I252 Old myocardial infarction: Secondary | ICD-10-CM | POA: Diagnosis not present

## 2014-10-07 DIAGNOSIS — I10 Essential (primary) hypertension: Secondary | ICD-10-CM | POA: Insufficient documentation

## 2014-10-07 DIAGNOSIS — Z8739 Personal history of other diseases of the musculoskeletal system and connective tissue: Secondary | ICD-10-CM | POA: Insufficient documentation

## 2014-10-07 DIAGNOSIS — E669 Obesity, unspecified: Secondary | ICD-10-CM | POA: Insufficient documentation

## 2014-10-07 DIAGNOSIS — Z7982 Long term (current) use of aspirin: Secondary | ICD-10-CM | POA: Diagnosis not present

## 2014-10-07 DIAGNOSIS — Z872 Personal history of diseases of the skin and subcutaneous tissue: Secondary | ICD-10-CM | POA: Diagnosis not present

## 2014-10-07 DIAGNOSIS — Z791 Long term (current) use of non-steroidal anti-inflammatories (NSAID): Secondary | ICD-10-CM | POA: Diagnosis not present

## 2014-10-07 DIAGNOSIS — R42 Dizziness and giddiness: Secondary | ICD-10-CM | POA: Insufficient documentation

## 2014-10-07 DIAGNOSIS — Z7902 Long term (current) use of antithrombotics/antiplatelets: Secondary | ICD-10-CM | POA: Insufficient documentation

## 2014-10-07 DIAGNOSIS — Z87891 Personal history of nicotine dependence: Secondary | ICD-10-CM | POA: Insufficient documentation

## 2014-10-07 DIAGNOSIS — Z9861 Coronary angioplasty status: Secondary | ICD-10-CM | POA: Diagnosis not present

## 2014-10-07 LAB — CBC
HCT: 43.9 % (ref 39.0–52.0)
Hemoglobin: 15.1 g/dL (ref 13.0–17.0)
MCH: 31.1 pg (ref 26.0–34.0)
MCHC: 34.4 g/dL (ref 30.0–36.0)
MCV: 90.3 fL (ref 78.0–100.0)
PLATELETS: 212 10*3/uL (ref 150–400)
RBC: 4.86 MIL/uL (ref 4.22–5.81)
RDW: 12.5 % (ref 11.5–15.5)
WBC: 10.4 10*3/uL (ref 4.0–10.5)

## 2014-10-07 LAB — I-STAT TROPONIN, ED: Troponin i, poc: 0 ng/mL (ref 0.00–0.08)

## 2014-10-07 LAB — BASIC METABOLIC PANEL
ANION GAP: 13 (ref 5–15)
BUN: 15 mg/dL (ref 6–23)
CALCIUM: 8.8 mg/dL (ref 8.4–10.5)
CO2: 22 mmol/L (ref 19–32)
CREATININE: 0.91 mg/dL (ref 0.50–1.35)
Chloride: 100 mmol/L (ref 96–112)
GFR, EST NON AFRICAN AMERICAN: 80 mL/min — AB (ref >90)
Glucose, Bld: 187 mg/dL — ABNORMAL HIGH (ref 70–99)
POTASSIUM: 3.6 mmol/L (ref 3.5–5.1)
SODIUM: 135 mmol/L (ref 135–145)

## 2014-10-07 MED ORDER — LORAZEPAM 0.5 MG PO TABS: 0.5000 mg | ORAL_TABLET | Freq: Four times a day (QID) | ORAL | Status: DC | PRN

## 2014-10-07 MED ORDER — LORAZEPAM 2 MG/ML IJ SOLN
0.5000 mg | Freq: Once | INTRAMUSCULAR | Status: AC
  Administered 2014-10-07: 0.5 mg via INTRAVENOUS
  Filled 2014-10-07: qty 1

## 2014-10-07 NOTE — Discharge Instructions (Signed)

## 2014-10-07 NOTE — ED Provider Notes (Signed)
CSN: [id number]     Arrival date & time 10/07/14  1913 History   First MD Initiated Contact with Patient 10/07/14 1931     Chief Complaint  Patient presents with  . Dizziness     (Consider location/radiation/quality/duration/timing/severity/associated sxs/prior Treatment) HPI   Curtis Tyler is a 74 y.o. male who presents for evaluation of dizziness which started at 6 PM tonight.  He has had similar episodes in the past, "about every 3 or 4 years".  He cannot recall if he was treated with medication for it previously.  He denies headache, cough, shortness of breath, chest pain, weakness or paresthesia.  He had some nausea and dry heaves but no vomiting with the dizziness.  The dizziness is characterized by a spinning sensation.  He denies blurred vision or change in hearing.  He is taking his usual medications.  There are no other known modifying factors.   Past Medical History  Diagnosis Date  . Hyperlipidemia   . Hypertension   . Coronary artery disease   . Obesity   . Carotid artery occlusion   . 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 Radiculopathy   . Bunion   . Callus    Past Surgical History  Procedure Laterality Date  . Hernia repair  1960    right hernia repair  . Appendectomy  1958  . Cardiac catheterization  05/2004    SHOWING 100% OCCLUDED DISTAL RIGHT CORONARY WITH  LEFT  TO RIGHT COLLATERALS, AS WELL AS ANTEGRADE FLOW, NORMAL LEFT MAIN, 50% NARROWING IN LEFT CIRCUMFLEX  WITH IRREGULARITIES IN THE LAD  . Coronary angioplasty      RIGHT CORONARY WERE UNSUCCESSFUL  . US echocardiography  01/03/2007    EF 55-60%  . Cardiovascular stress test  02/26/2010    EF 65%, SMALL AREA OF MILD INFARCT AND POSSIBLE PER-INFARCT ISCHEMIA IN THE INFEROBASAL WALL   Family History  Problem Relation Age of Onset  . Hypertension Neg Hx   . Stroke Father   . Congestive Heart Failure Mother   . Diabetes Mother    History  Substance Use Topics  . Smoking status: Former  Smoker -- 1.00 packs/day for 25 years    Types: Cigarettes    Quit date: 08/29/1990  . Smokeless tobacco: Never Used  . Alcohol Use: Yes     Comment: minimal; occasionally    Review of Systems  All other systems reviewed and are negative.     Allergies  Review of patient's allergies indicates no known allergies.  Home Medications   Prior to Admission medications   Medication Sig Start Date End Date Taking? Authorizing Provider  amLODipine (NORVASC) 5 MG tablet Take 5 mg by mouth daily.     Yes Historical Provider, MD  aspirin 81 MG tablet Take 81 mg by mouth daily.     Yes Historical Provider, MD  clopidogrel (PLAVIX) 75 MG tablet Take 75 mg by mouth daily.     Yes Historical Provider, MD  Coenzyme Q10 (CO Q 10) 100 MG CAPS Take by mouth daily.     Yes Historical Provider, MD  CRESTOR 10 MG tablet Take 10 mg by mouth every other day.  03/07/14  Yes Historical Provider, MD  doxazosin (CARDURA) 4 MG tablet Take 4 mg by mouth at bedtime.     Yes Historical Provider, MD  finasteride (PROSCAR) 5 MG tablet Take 5 mg by mouth daily.     Yes Historical Provider, MD  fish oil-omega-3 fatty  acids 1000 MG capsule Take 1 g by mouth daily.     Yes Historical Provider, MD  GLUCOSAMINE-CHONDROITIN PO Take 1 tablet by mouth daily.    Yes Historical Provider, MD  latanoprost (XALATAN) 0.005 % ophthalmic solution Place 1 drop into both eyes daily. 07/28/11  Yes Historical Provider, MD  losartan (COZAAR) 100 MG tablet Take 100 mg by mouth daily.     Yes Historical Provider, MD  meloxicam (MOBIC) 7.5 MG tablet Take 7.5 mg by mouth daily as needed for pain.    Yes Historical Provider, MD  Plant Sterols and Stanols (CHOLEST OFF) 450 MG TABS Take 1 tablet by mouth daily.    Yes Historical Provider, MD  tadalafil (CIALIS) 10 MG tablet Take 10 mg by mouth daily as needed for erectile dysfunction.   Yes Historical Provider, MD  LORazepam (ATIVAN) 0.5 MG tablet Take 1 tablet (0.5 mg total) by mouth every 6  (six) hours as needed (Dizziness). 10/07/14   Richarda Blade, MD   BP 143/65 mmHg  Pulse 75  Temp(Src) 98.5 F (36.9 C) (Oral)  Resp 19  SpO2 94% Physical Exam  Constitutional: He is oriented to person, place, and time. He appears well-developed and well-nourished.  HENT:  Head: Normocephalic and atraumatic.  Right Ear: External ear normal.  Left Ear: External ear normal.  Eyes: Conjunctivae and EOM are normal. Pupils are equal, round, and reactive to light.  Neck: Normal range of motion and phonation normal. Neck supple.  Cardiovascular: Normal rate, regular rhythm and normal heart sounds.   Pulmonary/Chest: Effort normal and breath sounds normal. He exhibits no bony tenderness.  Abdominal: Soft. There is no tenderness.  Musculoskeletal: Normal range of motion.  Neurological: He is alert and oriented to person, place, and time. No cranial nerve deficit or sensory deficit. He exhibits normal muscle tone. Coordination normal.  There is mild right beating nystagmus bilaterally.  There is no dysarthria or aphasia.  Normal finger-to-nose, and heel-to-shin, bilaterally.  No pronator drift.  No ataxia.  Skin: Skin is warm, dry and intact.  Psychiatric: He has a normal mood and affect. His behavior is normal. Judgment and thought content normal.  Nursing note and vitals reviewed.   ED Course  Procedures (including critical care time) Medications  LORazepam (ATIVAN) injection 0.5 mg (0.5 mg Intravenous Given 10/07/14 2018)    Patient Vitals for the past 24 hrs:  BP Temp Temp src Pulse Resp SpO2  10/07/14 2130 143/65 mmHg - - 75 19 94 %  10/07/14 2100 141/77 mmHg - - 73 19 96 %  10/07/14 2030 141/78 mmHg - - 73 17 96 %  10/07/14 2000 147/72 mmHg - - 73 18 95 %  10/07/14 1921 137/70 mmHg 98.5 F (36.9 C) Oral 72 20 100 %    9:49 PM Reevaluation with update and discussion. After initial assessment and treatment, an updated evaluation reveals he has been able to ambulated here without  dizziness.  At this time, he is comfortable.  He denies dizziness now.  Findings discussed with patient and family members, all questions answered.[name]    Labs Review Labs Reviewed  BASIC METABOLIC PANEL - Abnormal; Notable for the following:    Glucose, Bld 187 (*)    GFR calc non Af Amer 80 (*)    All other components within normal limits  CBC  I-STAT TROPOININ, ED    Imaging Review No results found.   EKG Interpretation   Date/Time:  Tuesday October 07 2014  19:20:26 EST Ventricular Rate:  71 PR Interval:  293 QRS Duration: 100 QT Interval:  448 QTC Calculation: 487 R Axis:   -13 Text Interpretation:  Sinus rhythm Prolonged PR interval Probable left  atrial enlargement Low voltage, precordial leads Abnormal R-wave  progression, late transition Borderline prolonged QT interval Since last  tracing PR interval and  QT has lengthened Confirmed by Eulis Foster  MD, Savas Elvin  ([id number]) on 10/07/2014 8:00:40 PM      MDM   Final diagnoses:  Vertigo    Eval. Is c/w recurrent peripheral vertigo. Doubt TIA/CVA, SBI or metabolic instability.  Nursing Notes Reviewed/ Care Coordinated Applicable Imaging Reviewed Interpretation of Laboratory Data incorporated into ED treatment  The patient appears reasonably screened and/or stabilized for discharge and I doubt any other medical condition or other [facility] requiring further screening, evaluation, or treatment in the ED at this time prior to discharge.  Plan: Home Medications- Ativan for dizziness; Home Treatments- Rest; return here if the recommended treatment, does not improve the symptoms; Recommended follow up- PCP in 1 week     Richarda Blade, MD 10/08/14 1225

## 2014-10-07 NOTE — ED Notes (Signed)
Pt in via EMS, sudden onset of dizziness around 6pm with nausea, pt pale and diaphoretic upon EMS arrival, EKG changes in route with skipped beats, denies pain, pt continues to remain diaphoretic, per EMS skin color has improved, pt reports continued dizziness, history of episodes like this in the past with no cause determined, IV started PTA

## 2015-01-09 ENCOUNTER — Ambulatory Visit (INDEPENDENT_AMBULATORY_CARE_PROVIDER_SITE_OTHER): Payer: PPO | Admitting: Cardiology

## 2015-01-09 ENCOUNTER — Encounter: Payer: Self-pay | Admitting: Cardiology

## 2015-01-09 VITALS — BP 120/66 | HR 70 | Ht 63.0 in | Wt 195.0 lb

## 2015-01-09 DIAGNOSIS — R55 Syncope and collapse: Secondary | ICD-10-CM

## 2015-01-09 NOTE — Patient Instructions (Signed)
Your physician recommends that you schedule a follow-up appointment in: one year with Dr. Hochrein  

## 2015-01-09 NOTE — Progress Notes (Signed)
HPI The patient presents for evaluation of known coronary artery disease. He had a cardiac catheterization in 2014 demonstrating left main 60% stenosis. The LAD had a proximal 60-70% stenosis. There was mid 40% stenosis. Diagonal is moderate size with 40% stenosis. The circumflex system included a large OM with 60% ostial stenosis the RCA was occluded before the PDA. The EF was 65%. However, we decided to manage him medically unless he had worsening symptoms.  He came back today for follow-up. Also he had some slight abnormalities on EKG when he was an ophthalmology office. This is unchanged from previous. He has a first-degree AV block. He has poor anterior R wave progression. Leftward axis. There were no acute findings however. He actually has done well. He exercises routinely. He works out on an elliptical.  The patient denies any new symptoms such as chest discomfort, neck or arm discomfort. There has been no new shortness of breath, PND or orthopnea. There have been no reported palpitations, presyncope or syncope.   No Known Allergies  Current Outpatient Prescriptions  Medication Sig Dispense Refill  . amLODipine (NORVASC) 5 MG tablet Take 5 mg by mouth daily.      Marland Kitchen aspirin 81 MG tablet Take 81 mg by mouth daily.      . clopidogrel (PLAVIX) 75 MG tablet Take 75 mg by mouth daily.      . Coenzyme Q10 (CO Q 10) 100 MG CAPS Take by mouth daily.      . CRESTOR 10 MG tablet Take 10 mg by mouth every other day.     . doxazosin (CARDURA) 4 MG tablet Take 4 mg by mouth at bedtime.      . finasteride (PROSCAR) 5 MG tablet Take 5 mg by mouth daily.      . fish oil-omega-3 fatty acids 1000 MG capsule Take 1 g by mouth daily.      Marland Kitchen GLUCOSAMINE-CHONDROITIN PO Take 1 tablet by mouth daily.     Marland Kitchen latanoprost (XALATAN) 0.005 % ophthalmic solution Place 1 drop into both eyes daily.    Marland Kitchen losartan (COZAAR) 100 MG tablet Take 100 mg by mouth daily.      . Plant Sterols and Stanols (CHOLEST OFF) 450 MG TABS  Take 1 tablet by mouth daily.     . tadalafil (CIALIS) 10 MG tablet Take 10 mg by mouth daily as needed for erectile dysfunction.     No current facility-administered medications for this visit.    Past Medical History  Diagnosis Date  . Hyperlipidemia   . Hypertension   . Coronary artery disease   . Obesity   . Carotid artery occlusion   . 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 Radiculopathy   . Bunion   . Callus     Past Surgical History  Procedure Laterality Date  . Hernia repair  1960    right hernia repair  . Appendectomy  1958  . Cardiac catheterization  05/2004    SHOWING 100% OCCLUDED DISTAL RIGHT CORONARY WITH  LEFT  TO RIGHT COLLATERALS, AS WELL AS ANTEGRADE FLOW, NORMAL LEFT MAIN, 50% NARROWING IN LEFT CIRCUMFLEX  WITH IRREGULARITIES IN THE LAD  . Coronary angioplasty      RIGHT CORONARY WERE UNSUCCESSFUL  . US echocardiography  01/03/2007    EF 55-60%  . Cardiovascular stress test  02/26/2010    EF 65%, SMALL AREA OF MILD INFARCT AND POSSIBLE PER-INFARCT ISCHEMIA IN THE INFEROBASAL WALL    ROS:  As stated in the HPI and negative for all other systems.  PHYSICAL EXAM BP 120/66 mmHg  Pulse 70  Ht 5\' 3"  (1.6 m)  Wt 195 lb (88.451 kg)  BMI 34.55 kg/m2 GENERAL:  Well appearing NECK:  No jugular venous distention, waveform within normal limits, carotid upstroke brisk and symmetric, no bruits, no thyromegaly LUNGS:  Clear to auscultation bilaterally CHEST:  Unremarkable HEART:  PMI not displaced or sustained,S1 and S2 within normal limits, no S3, no S4, no clicks, no rubs, no murmurs ABD:  Flat, positive bowel sounds normal in frequency in pitch, no bruits, no rebound, no guarding, no midline pulsatile mass, no hepatomegaly, no splenomegaly EXT:  2 plus pulses throughout, no edema, no cyanosis no clubbing  EKG:  Sinus rhythm, rate 70, first degree AV block, no acute ST-T wave changes.  01/09/2015  ASSESSMENT AND PLAN  CAD:  The patient has no new  sypmtoms consistent with angina.  No further cardiovascular testing is indicated.  We will continue with aggressive risk reduction and meds as listed.  HTN:  The blood pressure is at target. Despite the recent fluctuations at this time I do not think that any change in medications is indicated. We will continue with therapeutic lifestyle changes (TLC).  HYPERLIPIDEMIA:  This is followed by his primary provider.  He will send me the results.  CAROTID STENOSIS:  He is due for follow up of his moderate stenosis.   This will be followed up in 2 years.

## 2015-01-10 ENCOUNTER — Encounter: Payer: Self-pay | Admitting: Cardiology

## 2015-02-23 ENCOUNTER — Other Ambulatory Visit: Payer: Self-pay

## 2015-05-14 DIAGNOSIS — Z23 Encounter for immunization: Secondary | ICD-10-CM | POA: Insufficient documentation

## 2015-05-14 DIAGNOSIS — L03115 Cellulitis of right lower limb: Secondary | ICD-10-CM | POA: Insufficient documentation

## 2015-09-03 DIAGNOSIS — R0981 Nasal congestion: Secondary | ICD-10-CM | POA: Insufficient documentation

## 2015-09-03 DIAGNOSIS — R1032 Left lower quadrant pain: Secondary | ICD-10-CM | POA: Diagnosis not present

## 2015-09-16 DIAGNOSIS — Z0000 Encounter for general adult medical examination without abnormal findings: Secondary | ICD-10-CM | POA: Diagnosis not present

## 2015-09-16 DIAGNOSIS — E7800 Pure hypercholesterolemia, unspecified: Secondary | ICD-10-CM | POA: Diagnosis not present

## 2015-09-16 DIAGNOSIS — I10 Essential (primary) hypertension: Secondary | ICD-10-CM | POA: Diagnosis not present

## 2015-09-18 DIAGNOSIS — R7309 Other abnormal glucose: Secondary | ICD-10-CM | POA: Diagnosis not present

## 2015-09-21 DIAGNOSIS — Z0000 Encounter for general adult medical examination without abnormal findings: Secondary | ICD-10-CM | POA: Diagnosis not present

## 2015-09-21 DIAGNOSIS — K635 Polyp of colon: Secondary | ICD-10-CM | POA: Insufficient documentation

## 2015-09-21 DIAGNOSIS — E7800 Pure hypercholesterolemia, unspecified: Secondary | ICD-10-CM | POA: Diagnosis not present

## 2015-09-21 DIAGNOSIS — R69 Illness, unspecified: Secondary | ICD-10-CM | POA: Diagnosis not present

## 2015-09-21 DIAGNOSIS — E784 Other hyperlipidemia: Secondary | ICD-10-CM | POA: Diagnosis not present

## 2015-09-21 DIAGNOSIS — I10 Essential (primary) hypertension: Secondary | ICD-10-CM | POA: Diagnosis not present

## 2015-09-30 DIAGNOSIS — H9313 Tinnitus, bilateral: Secondary | ICD-10-CM | POA: Diagnosis not present

## 2015-11-30 DIAGNOSIS — M5106 Intervertebral disc disorders with myelopathy, lumbar region: Secondary | ICD-10-CM | POA: Diagnosis not present

## 2015-11-30 DIAGNOSIS — M5137 Other intervertebral disc degeneration, lumbosacral region: Secondary | ICD-10-CM | POA: Diagnosis not present

## 2015-11-30 DIAGNOSIS — M7062 Trochanteric bursitis, left hip: Secondary | ICD-10-CM | POA: Diagnosis not present

## 2015-12-09 ENCOUNTER — Ambulatory Visit: Payer: PPO | Attending: Specialist | Admitting: Physical Therapy

## 2015-12-09 ENCOUNTER — Encounter: Payer: Self-pay | Admitting: Physical Therapy

## 2015-12-09 DIAGNOSIS — M545 Low back pain, unspecified: Secondary | ICD-10-CM

## 2015-12-09 NOTE — Therapy (Signed)
[facility] [address] [place], [place], [address] Phone: [phone]   Fax:  [phone]  Physical Therapy Evaluation  Patient Details  Name: Curtis Tyler MRN: [id number] Date of Birth: 11/19/1940 Referring Provider: Tonita Cong  Encounter Date: 12/09/2015      PT End of Session - 12/09/15 1327    Visit Number 1   Date for PT Re-Evaluation 02/08/16   PT Start Time 1301   PT Stop Time 1350   PT Time Calculation (min) 49 min   Activity Tolerance Patient tolerated treatment well   Behavior During Therapy [facility] for tasks assessed/performed      Past Medical History  Diagnosis Date  . Hyperlipidemia   . Hypertension   . Coronary artery disease     left main 60% stenosis. The LAD had a proximal 60-70% stenosis. There was mid 40% stenosis. Diagonal is moderate size with 40% stenosis. The circumflex system included a large OM with 60% ostial stenosis the RCA was occluded before the PDA. The EF was 65%.  2014  . Obesity   . Carotid artery occlusion   . Myocardial infarct ([place]) 1992    inferior posterior  . Radiculopathy   . Bunion   . Callus     Past Surgical History  Procedure Laterality Date  . Hernia repair  1960    right hernia repair  . Appendectomy  1958  . Cardiac catheterization  05/2004    SHOWING 100% OCCLUDED DISTAL RIGHT CORONARY WITH  LEFT  TO RIGHT COLLATERALS, AS WELL AS ANTEGRADE FLOW, NORMAL LEFT MAIN, 50% NARROWING IN LEFT CIRCUMFLEX  WITH IRREGULARITIES IN THE LAD  . Coronary angioplasty      RIGHT CORONARY WERE UNSUCCESSFUL  . US echocardiography  01/03/2007    EF 55-60%  . Cardiovascular stress test  02/26/2010    EF 65%, SMALL AREA OF MILD INFARCT AND POSSIBLE PER-INFARCT ISCHEMIA IN THE INFEROBASAL WALL    There were no vitals filed for this visit.       Subjective Assessment - 12/09/15 1303    Subjective Patient reports that over the past few months he has been having right side low back  pain and left lateral hip and leg pain.  He had an injection in the left hip for bursitis as well and that is feeling better.  The right low back pain starts after he gets up in the morning.   Patient Stated Goals have less pain   Currently in Pain? Yes   Pain Score 4    Pain Location Back   Pain Orientation Right;Lower   Pain Descriptors / Indicators Aching;Shooting   Pain Type Acute pain   Pain Onset More than a month ago   Pain Frequency Constant   Aggravating Factors  moving, twisting, will increase the pain to 6/10   Pain Relieving Factors ice and lying down, pain at best is a 3/10   Effect of Pain on Daily Activities difficulty with function            [facility] PT Assessment - 12/09/15 0001    Assessment   Medical Diagnosis right low back pain   Referring Provider Beane   Onset Date/Surgical Date 10/11/15   Prior Therapy no   Precautions   Precautions None   Balance Screen   Has the patient fallen in the past 6 months No   Has the patient had a decrease in activity level because of a fear of falling?  No   Is the patient reluctant to leave their home because of a fear of falling?  No   Home Environment   Additional Comments gardens and does yardwork   Prior Function   Level of Independence Independent   Vocation Retired   Leisure plays golf 1x/week, has not been able to exercise in about 4 weeks, was doing elliptical and some weights   Posture/Postural Control   Posture Comments fwd head, rounded shoulders, decreased lordosis   AROM   Overall AROM Comments Lumbar ROM decreased 25% for flexion, decreased 50% for extension and side bending   Strength   Overall Strength Comments 4-/5 with some increased low back pain   Flexibility   Soft Tissue Assessment /Muscle Length --  some tightness in the HS and ITB   Palpation   Palpation comment he is very tight in the right SI area and right lumbar parapsinals are tender   Special Tests    Special Tests --  +SLR bilaterally  at 50 degrees                   [facility] Adult PT Treatment/Exercise - 12/09/15 0001    Modalities   Modalities Electrical Stimulation;Moist Heat   Moist Heat Therapy   Number Minutes Moist Heat 15 Minutes   Moist Heat Location Lumbar Spine   Electrical Stimulation   Electrical Stimulation Location right lumbar area   Electrical Stimulation Action IFC   Electrical Stimulation Parameters supine   Electrical Stimulation Goals Pain                PT Education - 12/09/15 1327    Education provided Yes   Education Details HS and piriformis stretches and WMS flexion exercises   Person(s) Educated Patient   Methods Explanation;Handout   Comprehension Verbalized understanding          PT Short Term Goals - 12/09/15 1333    PT SHORT TERM GOAL #1   Title independent with initial HEP   Time 1   Period Weeks   Status New           PT Long Term Goals - 12/09/15 1334    PT LONG TERM GOAL #1   Title understand proper posture and body mechanics   Time 8   Period Weeks   Status New   PT LONG TERM GOAL #2   Title decrease pain 50%   Time 8   Period Weeks   Status New   PT LONG TERM GOAL #3   Title report able to return to gym program   Time 8   Period Weeks   Status New   PT LONG TERM GOAL #4   Title increased Lumbar ROM 25%   Time 8   Period Weeks   Status New               Plan - 12/09/15 1328    Clinical Impression Statement Patient with right side low back pain.  Made worse with activity and twisting.  Has tight HS and piriformis mms, tender right SI area.  + SLR bilaterally that reproduced the right low back pain.   Rehab Potential Good   PT Frequency 2x / week   PT Duration 8 weeks   PT Treatment/Interventions ADLs/Self Care Home Management;Electrical Stimulation;Cryotherapy;Moist Heat;Traction;Ultrasound;Therapeutic activities;Therapeutic exercise;Manual techniques;Patient/family education   PT Next Visit Plan Work on flexibility, core  stability and body mechanics   Consulted and Agree with Plan of Care Patient  Patient will benefit from skilled therapeutic intervention in order to improve the following deficits and impairments:  Decreased strength, Decreased range of motion, Increased muscle spasms, Impaired flexibility, Improper body mechanics, Pain  Visit Diagnosis: Right-sided low back pain without sciatica - Plan: PT plan of care cert/re-cert      G-Codes - [id number]    Functional Assessment Tool Used foto 57% limitation   Functional Limitation Mobility: Walking and moving around   Mobility: Walking and Moving Around Current Status [phone]) At least 40 percent but less than 60 percent impaired, limited or restricted   Mobility: Walking and Moving Around Goal Status [phone]) At least 40 percent but less than 60 percent impaired, limited or restricted       Problem List Patient Active Problem List   Diagnosis Date Noted  . Chest pain 09/05/2012  . Cough 08/06/2011  . CAD (coronary artery disease) 04/26/2011  . Hypertension 04/26/2011  . Hyperlipidemia 04/26/2011  . Cerebrovascular disease 04/26/2011    [name], PT 12/09/2015, 1:47 PM  [address] [place], [place], [address] Phone: [phone]   Fax:  [phone]  Name: Sevrin Grahn MRN: [id number] Date of Birth: Feb 02, 1941

## 2015-12-15 ENCOUNTER — Ambulatory Visit: Payer: PPO | Admitting: Physical Therapy

## 2015-12-15 DIAGNOSIS — M545 Low back pain, unspecified: Secondary | ICD-10-CM

## 2015-12-15 NOTE — Therapy (Signed)
[address] [place], [place], [address] Phone: [phone]   Fax:  [phone]  Physical Therapy Treatment  Patient Details  Name: Curtis Tyler MRN: [id number] Date of Birth: 22-Jun-1941 Referring Provider: Tonita Cong  Encounter Date: 12/15/2015      PT End of Session - 12/15/15 0920    Visit Number 2   Date for PT Re-Evaluation 02/08/16   PT Start Time 0759   PT Stop Time 0900   PT Time Calculation (min) 61 min   Activity Tolerance Patient tolerated treatment well   Behavior During Therapy [facility] for tasks assessed/performed      Past Medical History  Diagnosis Date  . Hyperlipidemia   . Hypertension   . Coronary artery disease     left main 60% stenosis. The LAD had a proximal 60-70% stenosis. There was mid 40% stenosis. Diagonal is moderate size with 40% stenosis. The circumflex system included a large OM with 60% ostial stenosis the RCA was occluded before the PDA. The EF was 65%.  2014  . Obesity   . Carotid artery occlusion   . Myocardial infarct ([place]) 1992    inferior posterior  . Radiculopathy   . Bunion   . Callus     Past Surgical History  Procedure Laterality Date  . Hernia repair  1960    right hernia repair  . Appendectomy  1958  . Cardiac catheterization  05/2004    SHOWING 100% OCCLUDED DISTAL RIGHT CORONARY WITH  LEFT  TO RIGHT COLLATERALS, AS WELL AS ANTEGRADE FLOW, NORMAL LEFT MAIN, 50% NARROWING IN LEFT CIRCUMFLEX  WITH IRREGULARITIES IN THE LAD  . Coronary angioplasty      RIGHT CORONARY WERE UNSUCCESSFUL  . US echocardiography  01/03/2007    EF 55-60%  . Cardiovascular stress test  02/26/2010    EF 65%, SMALL AREA OF MILD INFARCT AND POSSIBLE PER-INFARCT ISCHEMIA IN THE INFEROBASAL WALL    There were no vitals filed for this visit.      Subjective Assessment - 12/15/15 0801    Subjective Patient reports that he has had an increase in LBP since the last visit, reports okay  until Saturday now with pain more in the center of the back.  Reports difficulty getting into bed.   Currently in Pain? Yes   Pain Score 4    Pain Location Back   Pain Orientation Right;Mid;Lower   Pain Descriptors / Indicators Aching;Shooting   Pain Type Acute pain                         [facility] Adult PT Treatment/Exercise - 12/15/15 0001    Exercises   Exercises Lumbar   Lumbar Exercises: Stretches   Passive Hamstring Stretch 20 seconds;3 reps   Piriformis Stretch 20 seconds;3 reps   Lumbar Exercises: Aerobic   Tread Mill NuStep Level 5 x 5 minutes   Lumbar Exercises: Machines for Strengthening   Other Lumbar Machine Exercise seated row 25#, lat pulls 25#   Lumbar Exercises: Supine   Other Supine Lumbar Exercises feet on ball K2C, trunk rotation, small bridges and isometric abdominal ball squeeze   Other Supine Lumbar Exercises calf stretches   Moist Heat Therapy   Number Minutes Moist Heat 15 Minutes   Moist Heat Location Lumbar Spine   Electrical Stimulation   Electrical Stimulation Location right lumbar area   Electrical Stimulation Action IFC   Electrical Stimulation Parameters supine  Electrical Stimulation Goals Pain                PT Education - 12/15/15 0920    Education provided Yes   Education Details HEP for feet on ball exercises at home   Person(s) Educated Patient   Methods Explanation;Demonstration;Handout   Comprehension Verbalized understanding          PT Short Term Goals - 12/15/15 0928    PT SHORT TERM GOAL #1   Title independent with initial HEP   Status Achieved           PT Long Term Goals - 12/09/15 1334    PT LONG TERM GOAL #1   Title understand proper posture and body mechanics   Time 8   Period Weeks   Status New   PT LONG TERM GOAL #2   Title decrease pain 50%   Time 8   Period Weeks   Status New   PT LONG TERM GOAL #3   Title report able to return to gym program   Time 8   Period Weeks    Status New   PT LONG TERM GOAL #4   Title increased Lumbar ROM 25%   Time 8   Period Weeks   Status New               Plan - 12/15/15 0920    Clinical Impression Statement Patient with some increase in mm pain after exercises, he is very tight in the LE's.  He will be on vacation the rest of the week.   PT Next Visit Plan Work on flexibility, core stability and body mechanics   Consulted and Agree with Plan of Care Patient      Patient will benefit from skilled therapeutic intervention in order to improve the following deficits and impairments:  Decreased strength, Decreased range of motion, Increased muscle spasms, Impaired flexibility, Improper body mechanics, Pain  Visit Diagnosis: Right-sided low back pain without sciatica     Problem List Patient Active Problem List   Diagnosis Date Noted  . Chest pain 09/05/2012  . Cough 08/06/2011  . CAD (coronary artery disease) 04/26/2011  . Hypertension 04/26/2011  . Hyperlipidemia 04/26/2011  . Cerebrovascular disease 04/26/2011    [name], PT 12/15/2015, 9:30 AM  [address] [place], [place], [address] Phone: [phone]   Fax:  [phone]  Name: Curtis Tyler MRN: [id number] Date of Birth: [birth year]

## 2015-12-24 DIAGNOSIS — H11442 Conjunctival cysts, left eye: Secondary | ICD-10-CM | POA: Diagnosis not present

## 2015-12-24 DIAGNOSIS — H401131 Primary open-angle glaucoma, bilateral, mild stage: Secondary | ICD-10-CM | POA: Diagnosis not present

## 2015-12-28 DIAGNOSIS — H11442 Conjunctival cysts, left eye: Secondary | ICD-10-CM | POA: Diagnosis not present

## 2015-12-29 ENCOUNTER — Encounter: Payer: Self-pay | Admitting: Physical Therapy

## 2015-12-29 ENCOUNTER — Ambulatory Visit: Payer: PPO | Attending: Specialist | Admitting: Physical Therapy

## 2015-12-29 DIAGNOSIS — M545 Low back pain, unspecified: Secondary | ICD-10-CM

## 2015-12-29 NOTE — Therapy (Signed)
[place] [place], [place], [address] Phone: [phone]   Fax:  [phone]  Physical Therapy Treatment  Patient Details  Name: Curtis Tyler MRN: [id number] Date of Birth: 1940/09/25 Referring Provider: Tonita Cong  Encounter Date: 12/29/2015      PT End of Session - 12/29/15 1334    Visit Number 3   Date for PT Re-Evaluation 02/08/16   PT Start Time 1310   PT Stop Time 1403   PT Time Calculation (min) 53 min   Activity Tolerance Patient tolerated treatment well   Behavior During Therapy [facility] for tasks assessed/performed      Past Medical History  Diagnosis Date  . Hyperlipidemia   . Hypertension   . Coronary artery disease     left main 60% stenosis. The LAD had a proximal 60-70% stenosis. There was mid 40% stenosis. Diagonal is moderate size with 40% stenosis. The circumflex system included a large OM with 60% ostial stenosis the RCA was occluded before the PDA. The EF was 65%.  2014  . Obesity   . Carotid artery occlusion   . Myocardial infarct ([place]) 1992    inferior posterior  . Radiculopathy   . Bunion   . Callus     Past Surgical History  Procedure Laterality Date  . Hernia repair  1960    right hernia repair  . Appendectomy  1958  . Cardiac catheterization  05/2004    SHOWING 100% OCCLUDED DISTAL RIGHT CORONARY WITH  LEFT  TO RIGHT COLLATERALS, AS WELL AS ANTEGRADE FLOW, NORMAL LEFT MAIN, 50% NARROWING IN LEFT CIRCUMFLEX  WITH IRREGULARITIES IN THE LAD  . Coronary angioplasty      RIGHT CORONARY WERE UNSUCCESSFUL  . US echocardiography  01/03/2007    EF 55-60%  . Cardiovascular stress test  02/26/2010    EF 65%, SMALL AREA OF MILD INFARCT AND POSSIBLE PER-INFARCT ISCHEMIA IN THE INFEROBASAL WALL    There were no vitals filed for this visit.      Subjective Assessment - 12/29/15 1308    Subjective Patient reports that he has been out of town for over a week.  Not much change in the pain  level.   Currently in Pain? Yes   Pain Score 3    Pain Location Back   Pain Orientation Lower;Mid;Right                         [facility] Adult PT Treatment/Exercise - 12/29/15 0001    Lumbar Exercises: Stretches   Passive Hamstring Stretch 20 seconds;3 reps   Piriformis Stretch 20 seconds;3 reps   Lumbar Exercises: Aerobic   Tread Mill NuStep Level 5 x 5 minutes   Lumbar Exercises: Machines for Strengthening   Other Lumbar Machine Exercise seated row 25#, lat pulls 25#   Lumbar Exercises: Supine   Other Supine Lumbar Exercises feet on ball K2C, trunk rotation, small bridges and isometric abdominal ball squeeze   Modalities   Modalities Traction   Moist Heat Therapy   Number Minutes Moist Heat 15 Minutes   Moist Heat Location Lumbar Spine   Electrical Stimulation   Electrical Stimulation Location right lumbar area   Electrical Stimulation Action IFC   Electrical Stimulation Parameters supine   Electrical Stimulation Goals Pain   Traction   Type of Traction Lumbar   Max (lbs) 70   Hold Time static   Time 15  PT Short Term Goals - 12/15/15 7218    PT SHORT TERM GOAL #1   Title independent with initial HEP   Status Achieved           PT Long Term Goals - 12/29/15 1335    PT LONG TERM GOAL #1   Title understand proper posture and body mechanics   Status Partially Met   PT LONG TERM GOAL #2   Title decrease pain 50%   Status On-going   PT LONG TERM GOAL #3   Title report able to return to gym program   Status Partially Met   PT LONG TERM GOAL #4   Title increased Lumbar ROM 25%   Status On-going               Plan - 12/29/15 1335    Clinical Impression Statement We tried traction today, he reports he is doing the exercises, has no questions with them.   PT Next Visit Plan see if traction helped   Consulted and Agree with Plan of Care Patient      Patient will benefit from skilled therapeutic intervention in  order to improve the following deficits and impairments:  Decreased strength, Decreased range of motion, Increased muscle spasms, Impaired flexibility, Improper body mechanics, Pain  Visit Diagnosis: Right-sided low back pain without sciatica     Problem List Patient Active Problem List   Diagnosis Date Noted  . Chest pain 09/05/2012  . Cough 08/06/2011  . CAD (coronary artery disease) 04/26/2011  . Hypertension 04/26/2011  . Hyperlipidemia 04/26/2011  . Cerebrovascular disease 04/26/2011    [name], PT 12/29/2015, 1:39 PM  [address] [place], [place], [address] Phone: [phone]   Fax:  [phone]  Name: Curtis Tyler MRN: [id number] Date of Birth: 05-04-1941

## 2016-01-11 ENCOUNTER — Encounter: Payer: Self-pay | Admitting: Physical Therapy

## 2016-01-11 ENCOUNTER — Ambulatory Visit: Payer: PPO | Admitting: Physical Therapy

## 2016-01-11 DIAGNOSIS — M545 Low back pain, unspecified: Secondary | ICD-10-CM

## 2016-01-11 NOTE — Therapy (Signed)
[place] [place], [place], [address] Phone: [phone]   Fax:  [phone]  Physical Therapy Treatment  Patient Details  Name: Curtis Tyler MRN: [id number] Date of Birth: 05/19/1941 Referring Provider: Tonita Cong  Encounter Date: 01/11/2016      PT End of Session - 01/11/16 0851    Visit Number 4   Date for PT Re-Evaluation 02/08/16   PT Start Time 0845   PT Stop Time 0938   PT Time Calculation (min) 53 min   Activity Tolerance Patient tolerated treatment well   Behavior During Therapy [facility] for tasks assessed/performed      Past Medical History  Diagnosis Date  . Hyperlipidemia   . Hypertension   . Coronary artery disease     left main 60% stenosis. The LAD had a proximal 60-70% stenosis. There was mid 40% stenosis. Diagonal is moderate size with 40% stenosis. The circumflex system included a large OM with 60% ostial stenosis the RCA was occluded before the PDA. The EF was 65%.  2014  . Obesity   . Carotid artery occlusion   . Myocardial infarct ([place]) 1992    inferior posterior  . Radiculopathy   . Bunion   . Callus     Past Surgical History  Procedure Laterality Date  . Hernia repair  1960    right hernia repair  . Appendectomy  1958  . Cardiac catheterization  05/2004    SHOWING 100% OCCLUDED DISTAL RIGHT CORONARY WITH  LEFT  TO RIGHT COLLATERALS, AS WELL AS ANTEGRADE FLOW, NORMAL LEFT MAIN, 50% NARROWING IN LEFT CIRCUMFLEX  WITH IRREGULARITIES IN THE LAD  . Coronary angioplasty      RIGHT CORONARY WERE UNSUCCESSFUL  . US echocardiography  01/03/2007    EF 55-60%  . Cardiovascular stress test  02/26/2010    EF 65%, SMALL AREA OF MILD INFARCT AND POSSIBLE PER-INFARCT ISCHEMIA IN THE INFEROBASAL WALL    There were no vitals filed for this visit.      Subjective Assessment - 01/11/16 0847    Subjective Patient reports that he felt like the traction did well.  We had order for one month, he  called and wanted to continue to see if traction can completely relieve symptoms.   Currently in Pain? Yes   Pain Score 3    Pain Location Back   Pain Orientation Lower;Right   Pain Descriptors / Indicators Aching   Pain Type Acute pain   Aggravating Factors  twisting   Pain Relieving Factors traction seemed to help                         [facility] Adult PT Treatment/Exercise - 01/11/16 0001    Lumbar Exercises: Stretches   Passive Hamstring Stretch 20 seconds;3 reps   Piriformis Stretch 20 seconds;3 reps   Lumbar Exercises: Aerobic   Tread Mill NuStep Level 5 x 5 minutes   Lumbar Exercises: Machines for Strengthening   Other Lumbar Machine Exercise seated row 25#, lat pulls 25#   Other Lumbar Machine Exercise hip extension and abduction 5# x15 each, 15# arm press for trunk stability   Modalities   Modalities Traction   Moist Heat Therapy   Number Minutes Moist Heat 15 Minutes   Moist Heat Location Lumbar Spine   Electrical Stimulation   Electrical Stimulation Location right lumbar area   Electrical Stimulation Action IFC   Electrical Stimulation Parameters supine  Electrical Stimulation Goals Pain   Traction   Type of Traction Lumbar   Max (lbs) 70   Hold Time static   Time 15                  PT Short Term Goals - 12/15/15 0928    PT SHORT TERM GOAL #1   Title independent with initial HEP   Status Achieved           PT Long Term Goals - 01/11/16 0920    PT LONG TERM GOAL #1   Title understand proper posture and body mechanics   Status Partially Met   PT LONG TERM GOAL #2   Title decrease pain 50%   Status Partially Met   PT LONG TERM GOAL #3   Title report able to return to gym program   Status Achieved   PT LONG TERM GOAL #4   Title increased Lumbar ROM 25%   Status Partially Met               Plan - 01/11/16 0918    Clinical Impression Statement Patient reports that traction helped, he feels like he would like to  continue another 4 weeks as he thinks he can get rid of his pain with some guidance on gym exercises and the traction.  today is his 4th visit.   PT Frequency 2x / week   PT Duration 4 weeks   PT Treatment/Interventions ADLs/Self Care Home Management;Electrical Stimulation;Cryotherapy;Moist Heat;Traction;Ultrasound;Therapeutic activities;Therapeutic exercise;Manual techniques;Patient/family education   PT Next Visit Plan will continue to work on flexibility and core strength as well as using traction for pain   Consulted and Agree with Plan of Care Patient      Patient will benefit from skilled therapeutic intervention in order to improve the following deficits and impairments:  Decreased strength, Decreased range of motion, Increased muscle spasms, Impaired flexibility, Improper body mechanics, Pain  Visit Diagnosis: Right-sided low back pain without sciatica - Plan: PT plan of care cert/re-cert     Problem List Patient Active Problem List   Diagnosis Date Noted  . Chest pain 09/05/2012  . Cough 08/06/2011  . CAD (coronary artery disease) 04/26/2011  . Hypertension 04/26/2011  . Hyperlipidemia 04/26/2011  . Cerebrovascular disease 04/26/2011    Curtis Boast., PT 01/11/2016, 9:22 AM  [place] [address] [place], [place], [address] Phone: [phone]   Fax:  [phone]  Name: Curtis Tyler MRN: [id number] Date of Birth: 1941-02-18

## 2016-01-17 NOTE — Progress Notes (Signed)
HPI The patient presents for evaluation of known coronary artery disease. He had a cardiac catheterization in 2014 demonstrating left main 60% stenosis. The LAD had a proximal 60-70% stenosis. There was mid 40% stenosis. Diagonal is moderate size with 40% stenosis. The circumflex system included a large OM with 60% ostial stenosis the RCA was occluded before the PDA. The EF was 65%. However, we decided to manage him medically unless he had worsening symptoms.  He comes back today for follow-up at one year since the last visit. Marland Kitchen  He actually has done well except for back pain. He exercises sometimes but is limited by back pain.  The patient denies any new symptoms such as chest discomfort, neck or arm discomfort. There has been no new shortness of breath, PND or orthopnea. There have been no reported palpitations, presyncope or syncope.   No Known Allergies  Current Outpatient Prescriptions  Medication Sig Dispense Refill  . amLODipine (NORVASC) 5 MG tablet Take 5 mg by mouth daily.      Marland Kitchen aspirin 81 MG tablet Take 81 mg by mouth daily.      . clopidogrel (PLAVIX) 75 MG tablet Take 75 mg by mouth daily.      . Coenzyme Q10 (CO Q 10) 100 MG CAPS Take by mouth daily.      . CRESTOR 10 MG tablet Take 10 mg by mouth every other day.     . doxazosin (CARDURA) 4 MG tablet Take 4 mg by mouth at bedtime.      Marland Kitchen ezetimibe (ZETIA) 10 MG tablet Take 10 mg by mouth daily.    . finasteride (PROSCAR) 5 MG tablet Take 5 mg by mouth daily.      . fish oil-omega-3 fatty acids 1000 MG capsule Take 1 g by mouth daily.      Marland Kitchen GLUCOSAMINE-CHONDROITIN PO Take 1 tablet by mouth daily.     Marland Kitchen latanoprost (XALATAN) 0.005 % ophthalmic solution Place 1 drop into both eyes daily.    Marland Kitchen losartan (COZAAR) 100 MG tablet Take 100 mg by mouth daily.      . Plant Sterols and Stanols (CHOLEST OFF) 450 MG TABS Take 1 tablet by mouth daily.     . tadalafil (CIALIS) 10 MG tablet Take 10 mg by mouth daily as needed for erectile  dysfunction.     No current facility-administered medications for this visit.    Past Medical History  Diagnosis Date  . Hyperlipidemia   . Hypertension   . Coronary artery disease     left main 60% stenosis. The LAD had a proximal 60-70% stenosis. There was mid 40% stenosis. Diagonal is moderate size with 40% stenosis. The circumflex system included a large OM with 60% ostial stenosis the RCA was occluded before the PDA. The EF was 65%.  2014  . Obesity   . Carotid artery occlusion   . Myocardial infarct ([place]) 1992    inferior posterior  . Radiculopathy   . Bunion   . Callus     Past Surgical History  Procedure Laterality Date  . Hernia repair  1960    right hernia repair  . Appendectomy  1958  . Cardiac catheterization  05/2004    SHOWING 100% OCCLUDED DISTAL RIGHT CORONARY WITH  LEFT  TO RIGHT COLLATERALS, AS WELL AS ANTEGRADE FLOW, NORMAL LEFT MAIN, 50% NARROWING IN LEFT CIRCUMFLEX  WITH IRREGULARITIES IN THE LAD  . Coronary angioplasty      RIGHT CORONARY WERE UNSUCCESSFUL  .  US echocardiography  01/03/2007    EF 55-60%  . Cardiovascular stress test  02/26/2010    EF 65%, SMALL AREA OF MILD INFARCT AND POSSIBLE PER-INFARCT ISCHEMIA IN THE INFEROBASAL WALL    ROS:   As stated in the HPI and negative for all other systems.  PHYSICAL EXAM BP 135/81 mmHg  Pulse 68  Ht 5\' 2"  (1.575 m)  Wt 193 lb 9.6 oz (87.816 kg)  BMI 35.40 kg/m2 GENERAL:  Well appearing NECK:  No jugular venous distention, waveform within normal limits, carotid upstroke brisk and symmetric, left bruits, no thyromegaly LUNGS:  Clear to auscultation bilaterally CHEST:  Unremarkable HEART:  PMI not displaced or sustained,S1 and S2 within normal limits, no S3, no S4, no clicks, no rubs, no murmurs ABD:  Flat, positive bowel sounds normal in frequency in pitch, no bruits, no rebound, no guarding, no midline pulsatile mass, no hepatomegaly, no splenomegaly EXT:  2 plus pulses throughout, no edema, no cyanosis  no clubbing   EKG:  Sinus rhythm, rate 68, first degree AV block, no acute ST-T wave changes.  01/18/2016  ASSESSMENT AND PLAN  CAD:  The patient has no new sypmtoms consistent with angina.   I will bring the patient back for a POET (Plain Old Exercise Test). This will allow me to screen for obstructive coronary disease, risk stratify and very importantly provide a prescription for exercise.  HTN:  The blood pressure is at target. Despite the recent fluctuations at this time I do not think that any change in medications is indicated. We will continue with therapeutic lifestyle changes (TLC).  HYPERLIPIDEMIA:  This is followed by his primary provider.  His LDL was 87 and HDL 53 and I reviewed these [facility] records from Jan.  He will continue the current meds.   CAROTID STENOSIS:  He is due for follow up of his moderate stenosis.   I will schedule.    BACK PAIN:  I gave him a limited prescription for Tramadol to see if this helps.

## 2016-01-18 ENCOUNTER — Ambulatory Visit (INDEPENDENT_AMBULATORY_CARE_PROVIDER_SITE_OTHER): Payer: PPO | Admitting: Cardiology

## 2016-01-18 ENCOUNTER — Ambulatory Visit: Payer: PPO | Admitting: Physical Therapy

## 2016-01-18 ENCOUNTER — Encounter: Payer: Self-pay | Admitting: Cardiology

## 2016-01-18 ENCOUNTER — Encounter: Payer: Self-pay | Admitting: Physical Therapy

## 2016-01-18 VITALS — BP 135/81 | HR 68 | Ht 62.0 in | Wt 193.6 lb

## 2016-01-18 DIAGNOSIS — I6529 Occlusion and stenosis of unspecified carotid artery: Secondary | ICD-10-CM

## 2016-01-18 DIAGNOSIS — M545 Low back pain, unspecified: Secondary | ICD-10-CM

## 2016-01-18 DIAGNOSIS — I2510 Atherosclerotic heart disease of native coronary artery without angina pectoris: Secondary | ICD-10-CM

## 2016-01-18 MED ORDER — TRAMADOL HCL 50 MG PO TABS: 50.0000 mg | ORAL_TABLET | Freq: Four times a day (QID) | ORAL | Status: DC | PRN

## 2016-01-18 NOTE — Patient Instructions (Addendum)
Medication Instructions:  Take Tramadol 25 mg daily then increase 25 mg four times daily then take 50 mg every 6 hours  Labwork: NONE  Testing/Procedures: Your physician has requested that you have an exercise tolerance test. For further information please visit [email or web address]. Please also follow instruction sheet, as given.  Your physician has requested that you have a carotid duplex. This test is an ultrasound of the carotid arteries in your neck. It looks at blood flow through these arteries that supply the brain with blood. Allow one hour for this exam. There are no restrictions or special instructions.   Follow-Up: Your physician wants you to follow-up in: 1 Year. You will receive a reminder letter in the mail two months in advance. If you don't receive a letter, please call our office to schedule the follow-up appointment.   Any Other Special Instructions Will Be Listed Below (If Applicable).     If you need a refill on your cardiac medications before your next appointment, please call your pharmacy.

## 2016-01-18 NOTE — Therapy (Addendum)
[place], [place], [address] Phone: [phone]   Fax:  [phone]  Physical Therapy Treatment  Patient Details  Name: Curtis Tyler MRN: [id number] Date of Birth: 05-Dec-1940 Referring Provider: Tonita Cong  Encounter Date: 01/18/2016      PT End of Session - 01/18/16 1347    Visit Number 5   Date for PT Re-Evaluation 02/08/16   PT Start Time 7867   PT Stop Time 1412   PT Time Calculation (min) 59 min   Activity Tolerance Patient tolerated treatment well   Behavior During Therapy [facility] for tasks assessed/performed      Past Medical History  Diagnosis Date  . Hyperlipidemia   . Hypertension   . Coronary artery disease     left main 60% stenosis. The LAD had a proximal 60-70% stenosis. There was mid 40% stenosis. Diagonal is moderate size with 40% stenosis. The circumflex system included a large OM with 60% ostial stenosis the RCA was occluded before the PDA. The EF was 65%.  2014  . Obesity   . Carotid artery occlusion   . Myocardial infarct ([place]) 1992    inferior posterior  . Radiculopathy   . Bunion   . Callus     Past Surgical History  Procedure Laterality Date  . Hernia repair  1960    right hernia repair  . Appendectomy  1958  . Cardiac catheterization  05/2004    SHOWING 100% OCCLUDED DISTAL RIGHT CORONARY WITH  LEFT  TO RIGHT COLLATERALS, AS WELL AS ANTEGRADE FLOW, NORMAL LEFT MAIN, 50% NARROWING IN LEFT CIRCUMFLEX  WITH IRREGULARITIES IN THE LAD  . Coronary angioplasty      RIGHT CORONARY WERE UNSUCCESSFUL  . US echocardiography  01/03/2007    EF 55-60%  . Cardiovascular stress test  02/26/2010    EF 65%, SMALL AREA OF MILD INFARCT AND POSSIBLE PER-INFARCT ISCHEMIA IN THE INFEROBASAL WALL    There were no vitals filed for this visit.      Subjective Assessment - 01/18/16 1313    Subjective Patient reports he is about the same, however he has played golf twice, he reports that  after the first day no issues, then he played again and that bothered him.  Spasms across the low back is what he describes   Currently in Pain? Yes   Pain Score 3    Pain Location Back   Pain Orientation Lower;Right   Pain Descriptors / Indicators Spasm   Aggravating Factors  after sitting   Pain Relieving Factors traction, exercises                         [facility] Adult PT Treatment/Exercise - 01/18/16 0001    Lumbar Exercises: Stretches   Passive Hamstring Stretch 4 reps;20 seconds   Piriformis Stretch 4 reps;20 seconds   Lumbar Exercises: Aerobic   Tread Mill NuStep Level 5 x 6 minutes   Lumbar Exercises: Machines for Strengthening   Other Lumbar Machine Exercise seated row 25#, lat pulls 25#, 5# golf swing each direction for obliques   Other Lumbar Machine Exercise hip extension and abduction 5# x15 each, 15# arm press for trunk stability   Lumbar Exercises: Supine   Other Supine Lumbar Exercises feet on ball K2C, trunk rotation, small bridges and isometric abdominal ball squeeze   Moist Heat Therapy   Number Minutes Moist Heat 15 Minutes   Moist Heat Location Lumbar Spine  Acupuncturist Location right lumbar area   Chartered certified accountant IFC   Electrical Stimulation Parameters supine   Electrical Stimulation Goals Pain   Traction   Type of Traction Lumbar   Max (lbs) 70   Hold Time static   Time 15                  PT Short Term Goals - 12/15/15 0928    PT SHORT TERM GOAL #1   Title independent with initial HEP   Status Achieved           PT Long Term Goals - 01/18/16 1349    PT LONG TERM GOAL #1   Title understand proper posture and body mechanics   Status Achieved   PT LONG TERM GOAL #2   Title decrease pain 50%   Status Partially Met   PT LONG TERM GOAL #3   Title report able to return to gym program   Status Achieved               Plan - 01/18/16 1348    Clinical Impression  Statement He is going to try a new medication, and will see if that helps.  He has been able to do more lately but pain continues   PT Next Visit Plan he may return to [place]   Consulted and Agree with Plan of Care Patient      Patient will benefit from skilled therapeutic intervention in order to improve the following deficits and impairments:  Decreased strength, Decreased range of motion, Increased muscle spasms, Impaired flexibility, Improper body mechanics, Pain  Visit Diagnosis: Right-sided low back pain without sciatica     Problem List Patient Active Problem List   Diagnosis Date Noted  . Chest pain 09/05/2012  . Cough 08/06/2011  . CAD (coronary artery disease) 04/26/2011  . Hypertension 04/26/2011  . Hyperlipidemia 04/26/2011  . Cerebrovascular disease 04/26/2011    PHYSICAL THERAPY DISCHARGE SUMMARY  Visits from Start of Care: 5   Plan: Patient agrees to discharge.  Patient goals were partially met. Patient is being discharged due to being pleased with the current functional level.  ?????       [name], PT 01/18/2016, 1:50 PM  [address] [place], [place], [address] Phone: [phone]   Fax:  [phone]  Name: Curtis Tyler MRN: [id number] Date of Birth: Oct 12, 1940

## 2016-02-04 ENCOUNTER — Telehealth (HOSPITAL_COMMUNITY): Payer: Self-pay

## 2016-02-04 NOTE — Telephone Encounter (Signed)
Encounter complete. 

## 2016-02-05 ENCOUNTER — Telehealth (HOSPITAL_COMMUNITY): Payer: Self-pay

## 2016-02-05 NOTE — Telephone Encounter (Signed)
Encounter complete. 

## 2016-02-08 ENCOUNTER — Telehealth: Payer: Self-pay | Admitting: Cardiology

## 2016-02-08 NOTE — Telephone Encounter (Signed)
Pt returning call to [facility] from 02-05-16-pls call

## 2016-02-09 ENCOUNTER — Ambulatory Visit (HOSPITAL_COMMUNITY)
Admission: RE | Admit: 2016-02-09 | Discharge: 2016-02-09 | Disposition: A | Discharge status: Home / Self Care | Payer: PPO | Source: Ambulatory Visit | Attending: Cardiology | Admitting: Cardiology

## 2016-02-09 ENCOUNTER — Telehealth (HOSPITAL_COMMUNITY): Payer: Self-pay

## 2016-02-09 DIAGNOSIS — I6529 Occlusion and stenosis of unspecified carotid artery: Secondary | ICD-10-CM

## 2016-02-09 DIAGNOSIS — I6523 Occlusion and stenosis of bilateral carotid arteries: Secondary | ICD-10-CM | POA: Diagnosis not present

## 2016-02-09 DIAGNOSIS — I2510 Atherosclerotic heart disease of native coronary artery without angina pectoris: Secondary | ICD-10-CM | POA: Diagnosis not present

## 2016-02-09 DIAGNOSIS — E785 Hyperlipidemia, unspecified: Secondary | ICD-10-CM | POA: Insufficient documentation

## 2016-02-09 DIAGNOSIS — R9439 Abnormal result of other cardiovascular function study: Secondary | ICD-10-CM | POA: Insufficient documentation

## 2016-02-09 DIAGNOSIS — I10 Essential (primary) hypertension: Secondary | ICD-10-CM | POA: Insufficient documentation

## 2016-02-09 LAB — EXERCISE TOLERANCE TEST
CHL CUP MPHR: 143 {beats}/min
CSEPED: 9 min
CSEPEDS: 1 s
CSEPEW: 10.1 METS
Peak HR: 118 {beats}/min
Percent HR: 82 %
RPE: 17
Rest HR: 75 {beats}/min

## 2016-02-09 NOTE — Telephone Encounter (Signed)
Encounter complete. 

## 2016-02-11 ENCOUNTER — Other Ambulatory Visit: Payer: Self-pay | Admitting: Cardiology

## 2016-02-11 DIAGNOSIS — R9439 Abnormal result of other cardiovascular function study: Secondary | ICD-10-CM

## 2016-02-12 ENCOUNTER — Encounter (HOSPITAL_COMMUNITY)
Admission: RE | Disposition: A | Discharge status: Home / Self Care | Payer: Self-pay | Source: Ambulatory Visit | Attending: Cardiology

## 2016-02-12 ENCOUNTER — Encounter (HOSPITAL_COMMUNITY): Payer: Self-pay | Admitting: *Deleted

## 2016-02-12 ENCOUNTER — Ambulatory Visit (HOSPITAL_COMMUNITY)
Admission: RE | Admit: 2016-02-12 | Discharge: 2016-02-12 | Disposition: A | Discharge status: Home / Self Care | Payer: PPO | Source: Ambulatory Visit | Attending: Cardiology | Admitting: Cardiology

## 2016-02-12 DIAGNOSIS — Z7982 Long term (current) use of aspirin: Secondary | ICD-10-CM | POA: Diagnosis not present

## 2016-02-12 DIAGNOSIS — E785 Hyperlipidemia, unspecified: Secondary | ICD-10-CM | POA: Diagnosis present

## 2016-02-12 DIAGNOSIS — Z7902 Long term (current) use of antithrombotics/antiplatelets: Secondary | ICD-10-CM | POA: Insufficient documentation

## 2016-02-12 DIAGNOSIS — I252 Old myocardial infarction: Secondary | ICD-10-CM | POA: Diagnosis not present

## 2016-02-12 DIAGNOSIS — I2510 Atherosclerotic heart disease of native coronary artery without angina pectoris: Secondary | ICD-10-CM | POA: Diagnosis not present

## 2016-02-12 DIAGNOSIS — I10 Essential (primary) hypertension: Secondary | ICD-10-CM | POA: Diagnosis not present

## 2016-02-12 DIAGNOSIS — I2582 Chronic total occlusion of coronary artery: Secondary | ICD-10-CM | POA: Diagnosis not present

## 2016-02-12 DIAGNOSIS — I2584 Coronary atherosclerosis due to calcified coronary lesion: Secondary | ICD-10-CM | POA: Diagnosis not present

## 2016-02-12 DIAGNOSIS — Z6835 Body mass index (BMI) 35.0-35.9, adult: Secondary | ICD-10-CM | POA: Diagnosis not present

## 2016-02-12 DIAGNOSIS — I6529 Occlusion and stenosis of unspecified carotid artery: Secondary | ICD-10-CM | POA: Insufficient documentation

## 2016-02-12 DIAGNOSIS — E669 Obesity, unspecified: Secondary | ICD-10-CM | POA: Insufficient documentation

## 2016-02-12 DIAGNOSIS — M549 Dorsalgia, unspecified: Secondary | ICD-10-CM | POA: Diagnosis not present

## 2016-02-12 DIAGNOSIS — R9439 Abnormal result of other cardiovascular function study: Secondary | ICD-10-CM | POA: Diagnosis not present

## 2016-02-12 HISTORY — PX: CARDIAC CATHETERIZATION: SHX172

## 2016-02-12 LAB — CBC
HEMATOCRIT: 43.9 % (ref 39.0–52.0)
HEMOGLOBIN: 14.9 g/dL (ref 13.0–17.0)
MCH: 30.7 pg (ref 26.0–34.0)
MCHC: 33.9 g/dL (ref 30.0–36.0)
MCV: 90.3 fL (ref 78.0–100.0)
Platelets: 138 10*3/uL — ABNORMAL LOW (ref 150–400)
RBC: 4.86 MIL/uL (ref 4.22–5.81)
RDW: 12.5 % (ref 11.5–15.5)
WBC: 5.5 10*3/uL (ref 4.0–10.5)

## 2016-02-12 LAB — BASIC METABOLIC PANEL
Anion gap: 8 (ref 5–15)
BUN: 12 mg/dL (ref 6–20)
CHLORIDE: 105 mmol/L (ref 101–111)
CO2: 24 mmol/L (ref 22–32)
Calcium: 9 mg/dL (ref 8.9–10.3)
Creatinine, Ser: 0.89 mg/dL (ref 0.61–1.24)
GFR calc Af Amer: >60 mL/min (ref >60)
GFR calc non Af Amer: >60 mL/min (ref >60)
GLUCOSE: 130 mg/dL — AB (ref 65–99)
Potassium: 3.9 mmol/L (ref 3.5–5.1)
Sodium: 137 mmol/L (ref 135–145)

## 2016-02-12 LAB — PROTIME-INR
INR: 1.12 (ref 0.00–1.49)
Prothrombin Time: 14.6 seconds (ref 11.6–15.2)

## 2016-02-12 SURGERY — LEFT HEART CATH AND CORONARY ANGIOGRAPHY
Anesthesia: LOCAL

## 2016-02-12 MED ORDER — SODIUM CHLORIDE 0.9 % WEIGHT BASED INFUSION: 3.0000 mL/kg/h | INTRAVENOUS | Status: DC

## 2016-02-12 MED ORDER — ACETAMINOPHEN 325 MG PO TABS: 650.0000 mg | ORAL_TABLET | ORAL | Status: DC | PRN

## 2016-02-12 MED ORDER — SODIUM CHLORIDE 0.9% FLUSH: 3.0000 mL | Freq: Two times a day (BID) | INTRAVENOUS | Status: DC

## 2016-02-12 MED ORDER — LIDOCAINE HCL (PF) 1 % IJ SOLN
INTRAMUSCULAR | Status: DC | PRN
  Administered 2016-02-12: 2 mL

## 2016-02-12 MED ORDER — HEPARIN SODIUM (PORCINE) 1000 UNIT/ML IJ SOLN
INTRAMUSCULAR | Status: DC | PRN
  Administered 2016-02-12: 4500 [IU] via INTRAVENOUS

## 2016-02-12 MED ORDER — ONDANSETRON HCL 4 MG/2ML IJ SOLN
4.0000 mg | Freq: Four times a day (QID) | INTRAMUSCULAR | Status: DC | PRN
Start: 2016-02-12 — End: 2016-02-12

## 2016-02-12 MED ORDER — SODIUM CHLORIDE 0.9% FLUSH: 3.0000 mL | INTRAVENOUS | Status: DC | PRN

## 2016-02-12 MED ORDER — SODIUM CHLORIDE 0.9 % IV SOLN: 250.0000 mL | INTRAVENOUS | Status: DC | PRN

## 2016-02-12 MED ORDER — FENTANYL CITRATE (PF) 100 MCG/2ML IJ SOLN
INTRAMUSCULAR | Status: AC
  Filled 2016-02-12: qty 2

## 2016-02-12 MED ORDER — HEPARIN (PORCINE) IN NACL 2-0.9 UNIT/ML-% IJ SOLN
INTRAMUSCULAR | Status: AC
  Filled 2016-02-12: qty 1000

## 2016-02-12 MED ORDER — VERAPAMIL HCL 2.5 MG/ML IV SOLN
INTRAVENOUS | Status: DC | PRN
  Administered 2016-02-12: 10 mL via INTRA_ARTERIAL

## 2016-02-12 MED ORDER — HEPARIN SODIUM (PORCINE) 1000 UNIT/ML IJ SOLN
INTRAMUSCULAR | Status: AC
  Filled 2016-02-12: qty 1

## 2016-02-12 MED ORDER — SODIUM CHLORIDE 0.9 % WEIGHT BASED INFUSION: 1.0000 mL/kg/h | INTRAVENOUS | Status: DC

## 2016-02-12 MED ORDER — VERAPAMIL HCL 2.5 MG/ML IV SOLN
INTRAVENOUS | Status: AC
  Filled 2016-02-12: qty 2

## 2016-02-12 MED ORDER — FENTANYL CITRATE (PF) 100 MCG/2ML IJ SOLN
INTRAMUSCULAR | Status: DC | PRN
  Administered 2016-02-12: 25 ug via INTRAVENOUS

## 2016-02-12 MED ORDER — LIDOCAINE HCL (PF) 1 % IJ SOLN
INTRAMUSCULAR | Status: AC
  Filled 2016-02-12: qty 30

## 2016-02-12 MED ORDER — HEPARIN (PORCINE) IN NACL 2-0.9 UNIT/ML-% IJ SOLN
INTRAMUSCULAR | Status: DC | PRN
  Administered 2016-02-12: 1000 mL

## 2016-02-12 MED ORDER — MIDAZOLAM HCL 2 MG/2ML IJ SOLN
INTRAMUSCULAR | Status: DC | PRN
  Administered 2016-02-12: 1 mg via INTRAVENOUS

## 2016-02-12 MED ORDER — SODIUM CHLORIDE 0.9 % WEIGHT BASED INFUSION
3.0000 mL/kg/h | INTRAVENOUS | Status: DC
  Administered 2016-02-12: 3 mL/kg/h via INTRAVENOUS

## 2016-02-12 MED ORDER — IOPAMIDOL (ISOVUE-370) INJECTION 76%
INTRAVENOUS | Status: AC
  Filled 2016-02-12: qty 100

## 2016-02-12 MED ORDER — MORPHINE SULFATE (PF) 2 MG/ML IV SOLN: 2.0000 mg | INTRAVENOUS | Status: DC | PRN

## 2016-02-12 MED ORDER — IOPAMIDOL (ISOVUE-370) INJECTION 76%
INTRAVENOUS | Status: DC | PRN
  Administered 2016-02-12: 110 mL via INTRA_ARTERIAL

## 2016-02-12 MED ORDER — MIDAZOLAM HCL 2 MG/2ML IJ SOLN
INTRAMUSCULAR | Status: AC
  Filled 2016-02-12: qty 2

## 2016-02-12 MED ORDER — ASPIRIN 81 MG PO CHEW: 81.0000 mg | CHEWABLE_TABLET | ORAL | Status: DC

## 2016-02-12 MED ORDER — IOPAMIDOL (ISOVUE-370) INJECTION 76%
INTRAVENOUS | Status: AC
  Filled 2016-02-12: qty 50

## 2016-02-12 SURGICAL SUPPLY — 15 items
CATH INFINITI 5 FR JL3.5 (CATHETERS) ×1 IMPLANT
CATH INFINITI 5FR ANG PIGTAIL (CATHETERS) ×1 IMPLANT
CATH INFINITI JR4 5F (CATHETERS) ×1 IMPLANT
CATH LAUNCHER 5F EBU3.0 (CATHETERS) IMPLANT
CATH OPTITORQUE TIG 4.0 5F (CATHETERS) ×1 IMPLANT
CATHETER LAUNCHER 5F EBU3.0 (CATHETERS) ×2
DEVICE RAD COMP TR BAND LRG (VASCULAR PRODUCTS) ×1 IMPLANT
GLIDESHEATH SLEND A-KIT 6F 22G (SHEATH) ×1 IMPLANT
KIT HEART LEFT (KITS) ×2 IMPLANT
PACK CARDIAC CATHETERIZATION (CUSTOM PROCEDURE TRAY) ×2 IMPLANT
SYR MEDRAD MARK V 150ML (SYRINGE) ×2 IMPLANT
TRANSDUCER W/STOPCOCK (MISCELLANEOUS) ×2 IMPLANT
TUBING CIL FLEX 10 FLL-RA (TUBING) ×2 IMPLANT
WIRE HI TORQ VERSACORE-J 145CM (WIRE) ×1 IMPLANT
WIRE SAFE-T 1.5MM-J .035X260CM (WIRE) ×1 IMPLANT

## 2016-02-12 NOTE — Discharge Instructions (Signed)
Radial Site Care °Refer to this sheet in the next few weeks. These instructions provide you with information about caring for yourself after your procedure. Your health care provider may also give you more specific instructions. Your treatment has been planned according to current medical practices, but problems sometimes occur. Call your health care provider if you have any problems or questions after your procedure. °WHAT TO EXPECT AFTER THE PROCEDURE °After your procedure, it is typical to have the following: °· Bruising at the radial site that usually fades within 1-2 weeks. °· Blood collecting in the tissue (hematoma) that may be painful to the touch. It should usually decrease in size and tenderness within 1-2 weeks. °HOME CARE INSTRUCTIONS °· Take medicines only as directed by your health care provider. °· You may shower 24-48 hours after the procedure or as directed by your health care provider. Remove the bandage (dressing) and gently wash the site with plain soap and water. Pat the area dry with a clean towel. Do not rub the site, because this may cause bleeding. °· Do not take baths, swim, or use a hot tub until your health care provider approves. °· Check your insertion site every day for redness, swelling, or drainage. °· Do not apply powder or lotion to the site. °· Do not flex or bend the affected arm for 24 hours or as directed by your health care provider. °· Do not push or pull heavy objects with the affected arm for 24 hours or as directed by your health care provider. °· Do not lift over 10 lb (4.5 kg) for 5 days after your procedure or as directed by your health care provider. °· Ask your health care provider when it is okay to: °¨ Return to work or school. °¨ Resume usual physical activities or sports. °¨ Resume sexual activity. °· Do not drive home if you are discharged the same day as the procedure. Have someone else drive you. °· You may drive 24 hours after the procedure unless otherwise  instructed by your health care provider. °· Do not operate machinery or power tools for 24 hours after the procedure. °· If your procedure was done as an outpatient procedure, which means that you went home the same day as your procedure, a responsible adult should be with you for the first 24 hours after you arrive home. °· Keep all follow-up visits as directed by your health care provider. This is important. °SEEK MEDICAL CARE IF: °· You have a fever. °· You have chills. °· You have increased bleeding from the radial site. Hold pressure on the site. °SEEK IMMEDIATE MEDICAL CARE IF: °· You have unusual pain at the radial site. °· You have redness, warmth, or swelling at the radial site. °· You have drainage (other than a small amount of blood on the dressing) from the radial site. °· The radial site is bleeding, and the bleeding does not stop after 30 minutes of holding steady pressure on the site. °· Your arm or hand becomes pale, cool, tingly, or numb. °  °This information is not intended to replace advice given to you by your health care provider. Make sure you discuss any questions you have with your health care provider. °  °Document Released: 09/17/2010 Document Revised: 09/05/2014 Document Reviewed: 03/03/2014 °Elsevier Interactive Patient Education ©2016 [employer] ° °

## 2016-02-12 NOTE — H&P (View-Only) (Signed)
HPI The patient presents for evaluation of known coronary artery disease. He had a cardiac catheterization in 2014 demonstrating left main 60% stenosis. The LAD had a proximal 60-70% stenosis. There was mid 40% stenosis. Diagonal is moderate size with 40% stenosis. The circumflex system included a large OM with 60% ostial stenosis the RCA was occluded before the PDA. The EF was 65%. However, we decided to manage him medically unless he had worsening symptoms.  He comes back today for follow-up at one year since the last visit. Marland Kitchen  He actually has done well except for back pain. He exercises sometimes but is limited by back pain.  The patient denies any new symptoms such as chest discomfort, neck or arm discomfort. There has been no new shortness of breath, PND or orthopnea. There have been no reported palpitations, presyncope or syncope.   No Known Allergies  Current Outpatient Prescriptions  Medication Sig Dispense Refill  . amLODipine (NORVASC) 5 MG tablet Take 5 mg by mouth daily.      Marland Kitchen aspirin 81 MG tablet Take 81 mg by mouth daily.      . clopidogrel (PLAVIX) 75 MG tablet Take 75 mg by mouth daily.      . Coenzyme Q10 (CO Q 10) 100 MG CAPS Take by mouth daily.      . CRESTOR 10 MG tablet Take 10 mg by mouth every other day.     . doxazosin (CARDURA) 4 MG tablet Take 4 mg by mouth at bedtime.      Marland Kitchen ezetimibe (ZETIA) 10 MG tablet Take 10 mg by mouth daily.    . finasteride (PROSCAR) 5 MG tablet Take 5 mg by mouth daily.      . fish oil-omega-3 fatty acids 1000 MG capsule Take 1 g by mouth daily.      Marland Kitchen GLUCOSAMINE-CHONDROITIN PO Take 1 tablet by mouth daily.     Marland Kitchen latanoprost (XALATAN) 0.005 % ophthalmic solution Place 1 drop into both eyes daily.    Marland Kitchen losartan (COZAAR) 100 MG tablet Take 100 mg by mouth daily.      . Plant Sterols and Stanols (CHOLEST OFF) 450 MG TABS Take 1 tablet by mouth daily.     . tadalafil (CIALIS) 10 MG tablet Take 10 mg by mouth daily as needed for erectile  dysfunction.     No current facility-administered medications for this visit.    Past Medical History  Diagnosis Date  . Hyperlipidemia   . Hypertension   . Coronary artery disease     left main 60% stenosis. The LAD had a proximal 60-70% stenosis. There was mid 40% stenosis. Diagonal is moderate size with 40% stenosis. The circumflex system included a large OM with 60% ostial stenosis the RCA was occluded before the PDA. The EF was 65%.  2014  . Obesity   . Carotid artery occlusion   . 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 Radiculopathy   . Bunion   . Callus     Past Surgical History  Procedure Laterality Date  . Hernia repair  1960    right hernia repair  . Appendectomy  1958  . Cardiac catheterization  05/2004    SHOWING 100% OCCLUDED DISTAL RIGHT CORONARY WITH  LEFT  TO RIGHT COLLATERALS, AS WELL AS ANTEGRADE FLOW, NORMAL LEFT MAIN, 50% NARROWING IN LEFT CIRCUMFLEX  WITH IRREGULARITIES IN THE LAD  . Coronary angioplasty      RIGHT CORONARY WERE UNSUCCESSFUL  .  US echocardiography  01/03/2007    EF 55-60%  . Cardiovascular stress test  02/26/2010    EF 65%, SMALL AREA OF MILD INFARCT AND POSSIBLE PER-INFARCT ISCHEMIA IN THE INFEROBASAL WALL    ROS:   As stated in the HPI and negative for all other systems.  PHYSICAL EXAM BP 135/81 mmHg  Pulse 68  Ht 5\' 2"  (1.575 m)  Wt 193 lb 9.6 oz (87.816 kg)  BMI 35.40 kg/m2 GENERAL:  Well appearing NECK:  No jugular venous distention, waveform within normal limits, carotid upstroke brisk and symmetric, left bruits, no thyromegaly LUNGS:  Clear to auscultation bilaterally CHEST:  Unremarkable HEART:  PMI not displaced or sustained,S1 and S2 within normal limits, no S3, no S4, no clicks, no rubs, no murmurs ABD:  Flat, positive bowel sounds normal in frequency in pitch, no bruits, no rebound, no guarding, no midline pulsatile mass, no hepatomegaly, no splenomegaly EXT:  2 plus pulses throughout, no edema, no cyanosis  no clubbing   EKG:  Sinus rhythm, rate 68, first degree AV block, no acute ST-T wave changes.  01/18/2016  ASSESSMENT AND PLAN  CAD:  The patient has no new sypmtoms consistent with angina.   I will bring the patient back for a POET (Plain Old Exercise Test). This will allow me to screen for obstructive coronary disease, risk stratify and very importantly provide a prescription for exercise.  HTN:  The blood pressure is at target. Despite the recent fluctuations at this time I do not think that any change in medications is indicated. We will continue with therapeutic lifestyle changes (TLC).  HYPERLIPIDEMIA:  This is followed by his primary provider.  His LDL was 87 and HDL 53 and I reviewed these [facility] records from Jan.  He will continue the current meds.   CAROTID STENOSIS:  He is due for follow up of his moderate stenosis.   I will schedule.    BACK PAIN:  I gave him a limited prescription for Tramadol to see if this helps.

## 2016-02-12 NOTE — Progress Notes (Signed)
Bilateral PIV posterior hands removed sites WNL, 2x2 gauze with tape placed

## 2016-02-12 NOTE — Interval H&P Note (Signed)
History and Physical Interval Note:  02/12/2016 10:20 AM  Curtis Tyler  has presented today for surgery, with the diagnosis of abnormal GXT.  Marland Kitchen The various methods of treatment have been discussed with the patient and family. After consideration of risks, benefits and other options for treatment, the patient has consented to  Procedure(s): Left Heart Cath and Coronary Angiography (N/A) as a surgical intervention .    The patient's history has been reviewed, patient examined, no change in status, stable for surgery.  I have reviewed the patient's chart and labs.  Questions were answered to the patient's satisfaction.    CAD Assessment (Coronary Angiography With or Without Left Heart Catheterization and/or Left Ventriculography)  Patient Information:    Patients With Known Obstructive CAD (e.g., Prior MI, Prior PCI, Prior CABG, or Obstructive Disease on Invasive Angiography)   Medically Managed Patient (No prior PCI, no prior CABG)   Intermediate-risk noninvasive findings   Asymptomatic/Controlled Symptoms OR Unchanged Findings  AUC Score:   U (4)   Indication:   50    Curtis Tyler

## 2016-02-15 ENCOUNTER — Telehealth: Payer: Self-pay | Admitting: Cardiology

## 2016-02-15 NOTE — Telephone Encounter (Signed)
New Message  Pt requested to speak w/ RN concerning recent surgery. Please call back and discuss.

## 2016-02-15 NOTE — Telephone Encounter (Signed)
Spoke with pt, he had a cath on Friday and he was referred for bypass surgery. He wants to talk to dr hochrein about this. He would like for dr hochrein to call him when available

## 2016-02-16 ENCOUNTER — Encounter: Payer: Self-pay | Admitting: Thoracic Surgery (Cardiothoracic Vascular Surgery)

## 2016-02-16 ENCOUNTER — Institutional Professional Consult (permissible substitution) (INDEPENDENT_AMBULATORY_CARE_PROVIDER_SITE_OTHER): Payer: PPO | Admitting: Thoracic Surgery (Cardiothoracic Vascular Surgery)

## 2016-02-16 VITALS — BP 121/63 | HR 71 | Resp 20 | Ht 62.5 in | Wt 192.0 lb

## 2016-02-16 DIAGNOSIS — I2510 Atherosclerotic heart disease of native coronary artery without angina pectoris: Secondary | ICD-10-CM | POA: Diagnosis not present

## 2016-02-16 NOTE — Patient Instructions (Signed)
Stop plavix after your dose on Friday

## 2016-02-16 NOTE — Progress Notes (Signed)
PCP is Robyne Peers., MD Referring Provider is Leonie Man, MD  Chief Complaint  Patient presents with  . Coronary Artery Disease    Surgical eval, Cardiac Cath 02/12/16    HPI: Mr. Market is sent for consultation regarding left main and three-vessel coronary disease.  He is a 75 year old gentleman with multiple cardiac risk factors including hyperlipidemia, hypertension, and remote tobacco abuse. He also has a family history of cardiac disease. He has a known history of cardiac disease dating back to 89 when he was incidentally found to have had an MI. He is not sure what happened. He had a catheterization at that time, and was treated medically.  He has had several catheterizations over the years and most recently in 2014 he had a 60% left main stenosis he also had 60% stenoses in the LAD and circumflex. He was asymptomatic and had an ejection fraction of 65%. He opted for medical management.  He recently saw Dr. Percival Spanish for an annual visit. He was not having any chest pain or shortness of breath. A stress test showed ST depression and was a high risk study. He then underwent cardiac catheterization where he was found to have a 60% ostial left main, a 90% proximal LAD, 90% proximal circumflex, and a diffusely diseased right coronary with a chronically totally occluded posterior descending.    Past Medical History  Diagnosis Date  . Hyperlipidemia   . Hypertension   . Coronary artery disease     left main 60% stenosis. The LAD had a proximal 60-70% stenosis. There was mid 40% stenosis. Diagonal is moderate size with 40% stenosis. The circumflex system included a large OM with 60% ostial stenosis the RCA was occluded before the PDA. The EF was 65%.  2014  . Obesity   . Carotid artery occlusion   . 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 Radiculopathy   . Bunion   . Callus     Past Surgical History  Procedure Laterality Date  . Hernia repair  1960    right  hernia repair  . Appendectomy  1958  . Cardiac catheterization  05/2004    SHOWING 100% OCCLUDED DISTAL RIGHT CORONARY WITH  LEFT  TO RIGHT COLLATERALS, AS WELL AS ANTEGRADE FLOW, NORMAL LEFT MAIN, 50% NARROWING IN LEFT CIRCUMFLEX  WITH IRREGULARITIES IN THE LAD  . Coronary angioplasty      RIGHT CORONARY WERE UNSUCCESSFUL  . US echocardiography  01/03/2007    EF 55-60%  . Cardiovascular stress test  02/26/2010    EF 65%, SMALL AREA OF MILD INFARCT AND POSSIBLE PER-INFARCT ISCHEMIA IN THE INFEROBASAL WALL  . Cardiac catheterization N/A 02/12/2016    Procedure: Left Heart Cath and Coronary Angiography;  Surgeon: Leonie Man, MD;  Location: [facility];  Service: Cardiovascular;  Laterality: N/A;    Family History  Problem Relation Age of Onset  . Hypertension Neg Hx   . Stroke Father   . Congestive Heart Failure Mother   . Diabetes Mother     Social History Social History  Substance Use Topics  . Smoking status: Former Smoker -- 1.00 packs/day for 25 years    Types: Cigarettes    Quit date: 08/29/1990  . Smokeless tobacco: Never Used  . Alcohol Use: Yes     Comment: minimal; occasionally    Current Outpatient Prescriptions  Medication Sig Dispense Refill  . amLODipine (NORVASC) 5 MG tablet Take 5 mg by mouth daily.      Marland Kitchen  aspirin 81 MG tablet Take 81 mg by mouth daily.      . clopidogrel (PLAVIX) 75 MG tablet Take 75 mg by mouth daily.      . Coenzyme Q10 (CO Q 10) 100 MG CAPS Take by mouth daily.      . CRESTOR 10 MG tablet Take 10 mg by mouth every other day.     . doxazosin (CARDURA) 4 MG tablet Take 4 mg by mouth at bedtime.      Marland Kitchen ezetimibe (ZETIA) 10 MG tablet Take 10 mg by mouth daily.    . finasteride (PROSCAR) 5 MG tablet Take 5 mg by mouth daily.      . fish oil-omega-3 fatty acids 1000 MG capsule Take 1 g by mouth daily.      Marland Kitchen GLUCOSAMINE-CHONDROITIN PO Take 1 tablet by mouth daily.     Marland Kitchen latanoprost (XALATAN) 0.005 % ophthalmic solution Place 1 drop into  both eyes daily.    Marland Kitchen losartan (COZAAR) 100 MG tablet Take 100 mg by mouth daily.      . Plant Sterols and Stanols (CHOLEST OFF) 450 MG TABS Take 1 tablet by mouth daily.     . tadalafil (CIALIS) 10 MG tablet Take 10 mg by mouth daily as needed for erectile dysfunction.    . traMADol (ULTRAM) 50 MG tablet Take 1 tablet (50 mg total) by mouth every 6 (six) hours as needed. 30 tablet 0   No current facility-administered medications for this visit.    No Known Allergies  Review of Systems  Constitutional: Negative for activity change, appetite change and unexpected weight change.  HENT: Positive for hearing loss. Negative for trouble swallowing and voice change.   Eyes: Negative for visual disturbance.  Respiratory: Negative for chest tightness, shortness of breath and wheezing.   Cardiovascular: Negative for chest pain, palpitations and leg swelling.  Gastrointestinal: Negative for abdominal pain and blood in stool.  Genitourinary: Positive for frequency. Negative for dysuria.       BPH  Musculoskeletal: Positive for myalgias, joint swelling and arthralgias.  Hematological: Negative for adenopathy. Bruises/bleeds easily.  Psychiatric/Behavioral: Negative.   All other systems reviewed and are negative.   Ht 5' 2.5" (1.588 m)  Wt 192 lb (87.091 kg)  BMI 34.54 kg/m2  SpO2  Physical Exam  Constitutional: He is oriented to person, place, and time. He appears well-developed and well-nourished. No distress.  HENT:  Head: Normocephalic and atraumatic.  Mouth/Throat: No oropharyngeal exudate.  Eyes: Conjunctivae and EOM are normal. Pupils are equal, round, and reactive to light. No scleral icterus.  Neck: Neck supple. No thyromegaly present.  Faint bruits bilaterally  Cardiovascular: Normal rate.   Murmur (2/6 systolic) heard. Pulses:      Carotid pulses are 2+ on the right side with bruit, and 2+ on the left side with bruit.      Radial pulses are 2+ on the right side, and 2+ on the  left side.       Dorsalis pedis pulses are 1+ on the right side, and 1+ on the left side.       Posterior tibial pulses are 1+ on the right side, and 1+ on the left side.  Pulmonary/Chest: Effort normal and breath sounds normal. No respiratory distress. He has no wheezes. He has no rales.  Abdominal: Soft. He exhibits no distension. There is no tenderness.  Musculoskeletal: Normal range of motion. He exhibits no edema.  Lymphadenopathy:    He has no cervical adenopathy.  Neurological: He is alert and oriented to person, place, and time. No cranial nerve deficit.  Motor grossly intact  Skin: Skin is warm and dry.  Psychiatric: He has a normal mood and affect.  Vitals reviewed.    Diagnostic Tests: STRESS TEST Study Highlights     Downsloping ST segment depression ST segment depression of 2 mm was noted during stress in the II, III, aVF, V4, V5 and V6 leads. There is ST elevation as well in aVR.  Abnormal high risk exercise treadmill test with electrocardiographic evidence of ischemia, possibly global ischemia.  Blood pressure overall decreased as exercise intensified. Shortness of breath.  Candee Furbish, MD   CARDIAC CATHETERIZATION Conclusion     Ost LM lesion, 60% stenosed. LM lesion, 50% stenosed. Calcified  Prox LAD to Mid LAD lesion, 90% stenosed. Calcified - napkin-ring like lesion between D1 & D2.  Prox Cx lesion, 95% stenosed. Calcified lesion at the takeoff of OM1  Prox RCA to Dist RCA lesion, 60% stenosed.  Dist RCA lesion, 100% stenosed. Known CTO with bridging collaterals and left-to-right collaterals filling the distal RCA system  The left ventricular systolic function is normal.  Normal LVEDP    I personally reviewed the cardiac catheterization films with findings as noted above. He has left main and severe three-vessel coronary disease.   Impression: Mr. Molle is a 75 year old gentleman with a long-standing history of coronary disease and multiple  cardiac risk factors. He is asymptomatic, but had a high-risk stress test. A catheterization he was found to have left main and three-vessel disease with severe stenoses in the LAD and proximal circumflex in addition to a diffusely diseased right coronary and a chronically totally occluded posterior descending. Left ventricular function is preserved.  Coronary bypass grafting is indicated for survival benefit.  I have discussed the general nature of the procedure, the need for general anesthesia, the use of cardiopulmonary bypass and the incisions to be used with Mr and Mrs. Zeh and their son. We discussed the expected hospital stay, overall recovery and short and long term outcomes. I informed them of the indications, risks, benefits, and alternatives. They understand the risks include, but are not limited to death, stroke, MI, DVT/PE, bleeding, possible need for transfusion, infections(typical or atypical), cardiac arrhythmias, as well as other organ system dysfunction including respiratory, renal, or GI complications.   He accepts the risks and agrees to proceed.  He is on Plavix for his moderate carotid disease. He needs to be off that for at least 5 days prior to surgery.  Plan: Stop plavix after dose on 02/18/2016  CABG on Thursday 02/25/2016  [name], MD [facility] [phone]

## 2016-02-17 ENCOUNTER — Other Ambulatory Visit: Payer: Self-pay

## 2016-02-17 DIAGNOSIS — I25111 Atherosclerotic heart disease of native coronary artery with angina pectoris with documented spasm: Secondary | ICD-10-CM

## 2016-02-17 DIAGNOSIS — I2510 Atherosclerotic heart disease of native coronary artery without angina pectoris: Secondary | ICD-10-CM

## 2016-02-22 ENCOUNTER — Other Ambulatory Visit: Payer: Self-pay | Admitting: *Deleted

## 2016-02-22 DIAGNOSIS — I2510 Atherosclerotic heart disease of native coronary artery without angina pectoris: Secondary | ICD-10-CM

## 2016-02-23 ENCOUNTER — Ambulatory Visit (HOSPITAL_BASED_OUTPATIENT_CLINIC_OR_DEPARTMENT_OTHER)
Admission: RE | Admit: 2016-02-23 | Discharge: 2016-02-23 | Disposition: A | Discharge status: Home / Self Care | Payer: PPO | Source: Ambulatory Visit | Attending: Thoracic Surgery (Cardiothoracic Vascular Surgery) | Admitting: Thoracic Surgery (Cardiothoracic Vascular Surgery)

## 2016-02-23 ENCOUNTER — Ambulatory Visit (HOSPITAL_COMMUNITY)
Admission: RE | Admit: 2016-02-23 | Discharge: 2016-02-23 | Disposition: A | Discharge status: Home / Self Care | Payer: PPO | Source: Ambulatory Visit | Attending: Thoracic Surgery (Cardiothoracic Vascular Surgery) | Admitting: Thoracic Surgery (Cardiothoracic Vascular Surgery)

## 2016-02-23 ENCOUNTER — Encounter (HOSPITAL_COMMUNITY)
Admission: RE | Admit: 2016-02-23 | Discharge: 2016-02-23 | Disposition: A | Discharge status: Home / Self Care | Payer: PPO | Source: Ambulatory Visit | Attending: Thoracic Surgery (Cardiothoracic Vascular Surgery) | Admitting: Thoracic Surgery (Cardiothoracic Vascular Surgery)

## 2016-02-23 ENCOUNTER — Other Ambulatory Visit: Payer: Self-pay

## 2016-02-23 ENCOUNTER — Ambulatory Visit (HOSPITAL_COMMUNITY)
Admission: RE | Admit: 2016-02-23 | Discharge: 2016-02-23 | Disposition: A | Discharge status: Home / Self Care | Payer: PPO | Source: Ambulatory Visit | Attending: Surgery | Admitting: Surgery

## 2016-02-23 ENCOUNTER — Encounter (HOSPITAL_COMMUNITY): Payer: Self-pay

## 2016-02-23 VITALS — BP 138/70 | HR 74 | Temp 97.7°F | Resp 20 | Ht 62.5 in | Wt 199.2 lb

## 2016-02-23 DIAGNOSIS — I700 Atherosclerosis of aorta: Secondary | ICD-10-CM

## 2016-02-23 DIAGNOSIS — Z01812 Encounter for preprocedural laboratory examination: Secondary | ICD-10-CM | POA: Insufficient documentation

## 2016-02-23 DIAGNOSIS — Z01818 Encounter for other preprocedural examination: Secondary | ICD-10-CM

## 2016-02-23 DIAGNOSIS — R0602 Shortness of breath: Secondary | ICD-10-CM | POA: Diagnosis not present

## 2016-02-23 DIAGNOSIS — I2510 Atherosclerotic heart disease of native coronary artery without angina pectoris: Secondary | ICD-10-CM | POA: Insufficient documentation

## 2016-02-23 DIAGNOSIS — D6959 Other secondary thrombocytopenia: Secondary | ICD-10-CM | POA: Diagnosis not present

## 2016-02-23 DIAGNOSIS — I25110 Atherosclerotic heart disease of native coronary artery with unstable angina pectoris: Secondary | ICD-10-CM | POA: Diagnosis not present

## 2016-02-23 DIAGNOSIS — Z79899 Other long term (current) drug therapy: Secondary | ICD-10-CM | POA: Diagnosis not present

## 2016-02-23 DIAGNOSIS — E785 Hyperlipidemia, unspecified: Secondary | ICD-10-CM | POA: Diagnosis not present

## 2016-02-23 DIAGNOSIS — J189 Pneumonia, unspecified organism: Secondary | ICD-10-CM | POA: Diagnosis not present

## 2016-02-23 DIAGNOSIS — Z0183 Encounter for blood typing: Secondary | ICD-10-CM | POA: Insufficient documentation

## 2016-02-23 DIAGNOSIS — I25111 Atherosclerotic heart disease of native coronary artery with angina pectoris with documented spasm: Secondary | ICD-10-CM

## 2016-02-23 DIAGNOSIS — J449 Chronic obstructive pulmonary disease, unspecified: Secondary | ICD-10-CM

## 2016-02-23 DIAGNOSIS — E669 Obesity, unspecified: Secondary | ICD-10-CM | POA: Diagnosis not present

## 2016-02-23 DIAGNOSIS — Z6835 Body mass index (BMI) 35.0-35.9, adult: Secondary | ICD-10-CM | POA: Diagnosis not present

## 2016-02-23 DIAGNOSIS — I517 Cardiomegaly: Secondary | ICD-10-CM | POA: Diagnosis not present

## 2016-02-23 DIAGNOSIS — Z8249 Family history of ischemic heart disease and other diseases of the circulatory system: Secondary | ICD-10-CM | POA: Diagnosis not present

## 2016-02-23 DIAGNOSIS — Z4682 Encounter for fitting and adjustment of non-vascular catheter: Secondary | ICD-10-CM | POA: Diagnosis not present

## 2016-02-23 DIAGNOSIS — I10 Essential (primary) hypertension: Secondary | ICD-10-CM | POA: Diagnosis not present

## 2016-02-23 DIAGNOSIS — D62 Acute posthemorrhagic anemia: Secondary | ICD-10-CM | POA: Diagnosis not present

## 2016-02-23 DIAGNOSIS — Z87891 Personal history of nicotine dependence: Secondary | ICD-10-CM | POA: Diagnosis not present

## 2016-02-23 DIAGNOSIS — Z452 Encounter for adjustment and management of vascular access device: Secondary | ICD-10-CM | POA: Diagnosis not present

## 2016-02-23 DIAGNOSIS — M1990 Unspecified osteoarthritis, unspecified site: Secondary | ICD-10-CM | POA: Diagnosis not present

## 2016-02-23 DIAGNOSIS — J9811 Atelectasis: Secondary | ICD-10-CM | POA: Diagnosis not present

## 2016-02-23 DIAGNOSIS — I080 Rheumatic disorders of both mitral and aortic valves: Secondary | ICD-10-CM | POA: Diagnosis not present

## 2016-02-23 DIAGNOSIS — Z7982 Long term (current) use of aspirin: Secondary | ICD-10-CM | POA: Diagnosis not present

## 2016-02-23 DIAGNOSIS — E8770 Fluid overload, unspecified: Secondary | ICD-10-CM | POA: Diagnosis not present

## 2016-02-23 DIAGNOSIS — I252 Old myocardial infarction: Secondary | ICD-10-CM | POA: Diagnosis not present

## 2016-02-23 DIAGNOSIS — Z9861 Coronary angioplasty status: Secondary | ICD-10-CM | POA: Diagnosis not present

## 2016-02-23 DIAGNOSIS — Z7902 Long term (current) use of antithrombotics/antiplatelets: Secondary | ICD-10-CM | POA: Diagnosis not present

## 2016-02-23 DIAGNOSIS — J90 Pleural effusion, not elsewhere classified: Secondary | ICD-10-CM | POA: Diagnosis not present

## 2016-02-23 HISTORY — DX: Reserved for inherently not codable concepts without codable children: IMO0001

## 2016-02-23 HISTORY — DX: Benign prostatic hyperplasia without lower urinary tract symptoms: N40.0

## 2016-02-23 HISTORY — DX: Unspecified osteoarthritis, unspecified site: M19.90

## 2016-02-23 LAB — CBC
HCT: 42.8 % (ref 39.0–52.0)
Hemoglobin: 14.7 g/dL (ref 13.0–17.0)
MCH: 30.9 pg (ref 26.0–34.0)
MCHC: 34.3 g/dL (ref 30.0–36.0)
MCV: 89.9 fL (ref 78.0–100.0)
PLATELETS: 142 10*3/uL — AB (ref 150–400)
RBC: 4.76 MIL/uL (ref 4.22–5.81)
RDW: 12.5 % (ref 11.5–15.5)
WBC: 6.4 10*3/uL (ref 4.0–10.5)

## 2016-02-23 LAB — PULMONARY FUNCTION TEST
DL/VA % PRED: 104 %
DL/VA: 4.2 ml/min/mmHg/L
DLCO UNC % PRED: 88 %
DLCO UNC: 19.6 ml/min/mmHg
FEF 25-75 POST: 2.02 L/s
FEF 25-75 PRE: 2.3 L/s
FEF2575-%Change-Post: -12 %
FEF2575-%PRED-POST: 140 %
FEF2575-%Pred-Pre: 159 %
FEV1-%Change-Post: -3 %
FEV1-%Pred-Post: 111 %
FEV1-%Pred-Pre: 114 %
FEV1-POST: 2.3 L
FEV1-Pre: 2.38 L
FEV1FVC-%CHANGE-POST: 2 %
FEV1FVC-%Pred-Pre: 109 %
FEV6-%CHANGE-POST: -4 %
FEV6-%PRED-POST: 105 %
FEV6-%PRED-PRE: 110 %
FEV6-PRE: 3 L
FEV6-Post: 2.85 L
FEV6FVC-%CHANGE-POST: 0 %
FEV6FVC-%PRED-PRE: 108 %
FEV6FVC-%Pred-Post: 108 %
FVC-%Change-Post: -5 %
FVC-%PRED-POST: 97 %
FVC-%Pred-Pre: 102 %
FVC-Post: 2.85 L
FVC-Pre: 3.02 L
PRE FEV1/FVC RATIO: 79 %
Post FEV1/FVC ratio: 81 %
Post FEV6/FVC ratio: 100 %
Pre FEV6/FVC Ratio: 100 %
RV % pred: 97 %
RV: 2.13 L
TLC % PRED: 91 %
TLC: 5.04 L

## 2016-02-23 LAB — COMPREHENSIVE METABOLIC PANEL
ALT: 30 U/L (ref 17–63)
ANION GAP: 6 (ref 5–15)
AST: 28 U/L (ref 15–41)
Albumin: 3.9 g/dL (ref 3.5–5.0)
Alkaline Phosphatase: 78 U/L (ref 38–126)
BUN: 16 mg/dL (ref 6–20)
CO2: 23 mmol/L (ref 22–32)
Calcium: 8.8 mg/dL — ABNORMAL LOW (ref 8.9–10.3)
Chloride: 104 mmol/L (ref 101–111)
Creatinine, Ser: 1.44 mg/dL — ABNORMAL HIGH (ref 0.61–1.24)
GFR, EST AFRICAN AMERICAN: 53 mL/min — AB (ref >60)
GFR, EST NON AFRICAN AMERICAN: 45 mL/min — AB (ref >60)
Glucose, Bld: 109 mg/dL — ABNORMAL HIGH (ref 65–99)
Potassium: 4.4 mmol/L (ref 3.5–5.1)
SODIUM: 133 mmol/L — AB (ref 135–145)
Total Bilirubin: 1.2 mg/dL (ref 0.3–1.2)
Total Protein: 7.2 g/dL (ref 6.5–8.1)

## 2016-02-23 LAB — BLOOD GAS, ARTERIAL
ACID-BASE EXCESS: 2.6 mmol/L — AB (ref 0.0–2.0)
BICARBONATE: 26.5 meq/L — AB (ref 20.0–24.0)
Drawn by: 206361
FIO2: 0.21
O2 SAT: 95.7 %
PO2 ART: 79.2 mmHg — AB (ref 80.0–100.0)
Patient temperature: 98.6
TCO2: 27.7 mmol/L (ref 0–100)
pCO2 arterial: 40.2 mmHg (ref 35.0–45.0)
pH, Arterial: 7.434 (ref 7.350–7.450)

## 2016-02-23 LAB — SURGICAL PCR SCREEN
MRSA, PCR: NEGATIVE
STAPHYLOCOCCUS AUREUS: NEGATIVE

## 2016-02-23 LAB — PROTIME-INR
INR: 1.09 (ref 0.00–1.49)
PROTHROMBIN TIME: 14.3 s (ref 11.6–15.2)

## 2016-02-23 LAB — URINALYSIS, ROUTINE W REFLEX MICROSCOPIC
Bilirubin Urine: NEGATIVE
GLUCOSE, UA: NEGATIVE mg/dL
HGB URINE DIPSTICK: NEGATIVE
KETONES UR: NEGATIVE mg/dL
LEUKOCYTES UA: NEGATIVE
Nitrite: NEGATIVE
PH: 7 (ref 5.0–8.0)
PROTEIN: NEGATIVE mg/dL
Specific Gravity, Urine: 1.01 (ref 1.005–1.030)

## 2016-02-23 LAB — ABO/RH: ABO/RH(D): B POS

## 2016-02-23 LAB — TYPE AND SCREEN
ABO/RH(D): B POS
Antibody Screen: NEGATIVE

## 2016-02-23 LAB — APTT: APTT: 30 s (ref 24–37)

## 2016-02-23 MED ORDER — ALBUTEROL SULFATE (2.5 MG/3ML) 0.083% IN NEBU
2.5000 mg | INHALATION_SOLUTION | Freq: Once | RESPIRATORY_TRACT | Status: AC
  Administered 2016-02-23: 2.5 mg via RESPIRATORY_TRACT

## 2016-02-23 NOTE — Progress Notes (Signed)
Pre-op Cardiac Surgery  Carotid Findings:  Performed on 02/09/2016  Upper Extremity Right Left  Brachial Pressures 126 Triphasic 140 Triphasic  Radial Waveforms Triphasic Triphasic  Ulnar Waveforms Triphasic Triphasic  Pal[place] Arch (Allen's Test) Normal Normal   Findings:  [place] Arch evaluation-Doppler waveforms remained normal with both radial and ulnar compressions bilaterally.    Lower  Extremity Right Left  Dorsalis Pedis  143 Triphasic  134 Triphasic  Posterior Tibial  151 Triphasic  159Triphasic  Ankle/Brachial Indices  1.08  1.14    Findings:   ABI's and Doppler waveforms bilaterally are with normal limits at rest.

## 2016-02-23 NOTE — Pre-Procedure Instructions (Signed)
    Abhijot Armacost  02/23/2016    Your procedure is scheduled on Thursday, June 29.  Report to [facility] Admitting at 5:30 A.M.                Your surgery or procedure is scheduled for 8:00AM   Call this number if you have problems the morning of surgery:[phone]                For any other questions, please call [phone], Monday - Friday 8 AM - 4 PM.   Remember:  Do not eat food or drink liquids after midnight Wednesday, June 28.  Take these medicines the morning of surgery with A SIP OF WATER:amLODipine (NORVASC), doxazosin (CARDURA), ezetimibe (ZETIA).                Take if needed Tramadol (Ultram).                  Stop all Vitamins, Aspirin, Aspirin Products, Herbal Products.                      Hold Plavix as instructed by Dr. Roxan Hockey.   Do not wear jewelry, make-up or nail polish.  Do not wear lotions, powders, or perfumes.    Men may shave face and neck.  Do not bring valuables to the hospital.  [facility] is not responsible for any belongings or valuables.  Contacts, dentures or bridgework may not be worn into surgery.  Leave your suitcase in the car.  After surgery it may be brought to your room.  For patients admitted to the hospital, discharge time will be determined by your treatment team.  Please read over the following fact sheets that you were given: [facility]- Preparing For Surgery and Patient Instructions for Mupirocin Application, Incentive Spirometery

## 2016-02-24 ENCOUNTER — Encounter (HOSPITAL_COMMUNITY): Payer: Self-pay | Admitting: Certified Registered Nurse Anesthetist

## 2016-02-24 LAB — HEMOGLOBIN A1C
HEMOGLOBIN A1C: 6.2 % — AB (ref 4.8–5.6)
Mean Plasma Glucose: 131 mg/dL

## 2016-02-24 MED ORDER — DOPAMINE-DEXTROSE 3.2-5 MG/ML-% IV SOLN
0.0000 ug/kg/min | INTRAVENOUS | Status: DC
  Filled 2016-02-24: qty 250

## 2016-02-24 MED ORDER — METOPROLOL TARTRATE 12.5 MG HALF TABLET
12.5000 mg | ORAL_TABLET | Freq: Once | ORAL | Status: AC
  Administered 2016-02-25: 12.5 mg via ORAL
  Filled 2016-02-24: qty 1

## 2016-02-24 MED ORDER — DEXTROSE 5 % IV SOLN
0.0000 ug/min | INTRAVENOUS | Status: DC
  Filled 2016-02-24: qty 4

## 2016-02-24 MED ORDER — NITROGLYCERIN IN D5W 200-5 MCG/ML-% IV SOLN
2.0000 ug/min | INTRAVENOUS | Status: DC
Start: 2016-02-25 — End: 2016-02-25
  Filled 2016-02-24: qty 250

## 2016-02-24 MED ORDER — SODIUM CHLORIDE 0.9 % IV SOLN
INTRAVENOUS | Status: DC
  Filled 2016-02-24: qty 40

## 2016-02-24 MED ORDER — HEPARIN SODIUM (PORCINE) 1000 UNIT/ML IJ SOLN
INTRAMUSCULAR | Status: DC
  Filled 2016-02-24: qty 30

## 2016-02-24 MED ORDER — DEXMEDETOMIDINE HCL IN NACL 400 MCG/100ML IV SOLN
0.1000 ug/kg/h | INTRAVENOUS | Status: AC
  Administered 2016-02-25: 0.7 ug/kg/h via INTRAVENOUS
  Filled 2016-02-24: qty 100

## 2016-02-24 MED ORDER — PLASMA-LYTE 148 IV SOLN
INTRAVENOUS | Status: AC
  Administered 2016-02-25: 500 mL
  Filled 2016-02-24: qty 2.5

## 2016-02-24 MED ORDER — INSULIN REGULAR HUMAN 100 UNIT/ML IJ SOLN
INTRAMUSCULAR | Status: DC
  Filled 2016-02-24: qty 2.5

## 2016-02-24 MED ORDER — MAGNESIUM SULFATE 50 % IJ SOLN
40.0000 meq | INTRAMUSCULAR | Status: DC
  Filled 2016-02-24: qty 10

## 2016-02-24 MED ORDER — PHENYLEPHRINE HCL 10 MG/ML IJ SOLN
30.0000 ug/min | INTRAMUSCULAR | Status: DC
  Filled 2016-02-24: qty 2

## 2016-02-24 MED ORDER — VANCOMYCIN HCL 10 G IV SOLR
1500.0000 mg | INTRAVENOUS | Status: AC
  Administered 2016-02-25: 1500 mg via INTRAVENOUS
  Filled 2016-02-24: qty 1500

## 2016-02-24 MED ORDER — POTASSIUM CHLORIDE 2 MEQ/ML IV SOLN
80.0000 meq | INTRAVENOUS | Status: DC
  Filled 2016-02-24: qty 40

## 2016-02-24 MED ORDER — DEXTROSE 5 % IV SOLN
1.5000 g | INTRAVENOUS | Status: AC
  Administered 2016-02-25: .75 g via INTRAVENOUS
  Administered 2016-02-25: 1.5 g via INTRAVENOUS
  Filled 2016-02-24: qty 1.5

## 2016-02-24 MED ORDER — DEXTROSE 5 % IV SOLN
750.0000 mg | INTRAVENOUS | Status: DC
  Filled 2016-02-24: qty 750

## 2016-02-25 ENCOUNTER — Inpatient Hospital Stay (HOSPITAL_COMMUNITY)
Admission: RE | Admit: 2016-02-25 | Discharge: 2016-02-29 | DRG: 236 | Disposition: A | Discharge status: Home / Self Care | Payer: PPO | Source: Ambulatory Visit | Attending: Thoracic Surgery (Cardiothoracic Vascular Surgery) | Admitting: Thoracic Surgery (Cardiothoracic Vascular Surgery)

## 2016-02-25 ENCOUNTER — Inpatient Hospital Stay (HOSPITAL_COMMUNITY): Payer: PPO | Admitting: Anesthesiology

## 2016-02-25 ENCOUNTER — Encounter (HOSPITAL_COMMUNITY)
Admission: RE | Disposition: A | Discharge status: Home / Self Care | Payer: Self-pay | Source: Ambulatory Visit | Attending: Thoracic Surgery (Cardiothoracic Vascular Surgery)

## 2016-02-25 ENCOUNTER — Inpatient Hospital Stay (HOSPITAL_COMMUNITY): Payer: PPO

## 2016-02-25 ENCOUNTER — Encounter (HOSPITAL_COMMUNITY): Payer: Self-pay | Admitting: Surgery

## 2016-02-25 DIAGNOSIS — Z4682 Encounter for fitting and adjustment of non-vascular catheter: Secondary | ICD-10-CM | POA: Diagnosis not present

## 2016-02-25 DIAGNOSIS — I517 Cardiomegaly: Secondary | ICD-10-CM | POA: Diagnosis not present

## 2016-02-25 DIAGNOSIS — Z7982 Long term (current) use of aspirin: Secondary | ICD-10-CM

## 2016-02-25 DIAGNOSIS — Z87891 Personal history of nicotine dependence: Secondary | ICD-10-CM

## 2016-02-25 DIAGNOSIS — D6959 Other secondary thrombocytopenia: Secondary | ICD-10-CM | POA: Diagnosis not present

## 2016-02-25 DIAGNOSIS — D62 Acute posthemorrhagic anemia: Secondary | ICD-10-CM | POA: Diagnosis not present

## 2016-02-25 DIAGNOSIS — E785 Hyperlipidemia, unspecified: Secondary | ICD-10-CM | POA: Diagnosis present

## 2016-02-25 DIAGNOSIS — J90 Pleural effusion, not elsewhere classified: Secondary | ICD-10-CM | POA: Diagnosis not present

## 2016-02-25 DIAGNOSIS — Z7902 Long term (current) use of antithrombotics/antiplatelets: Secondary | ICD-10-CM | POA: Diagnosis not present

## 2016-02-25 DIAGNOSIS — I10 Essential (primary) hypertension: Secondary | ICD-10-CM | POA: Diagnosis present

## 2016-02-25 DIAGNOSIS — Z9861 Coronary angioplasty status: Secondary | ICD-10-CM

## 2016-02-25 DIAGNOSIS — E8770 Fluid overload, unspecified: Secondary | ICD-10-CM | POA: Diagnosis not present

## 2016-02-25 DIAGNOSIS — I2510 Atherosclerotic heart disease of native coronary artery without angina pectoris: Principal | ICD-10-CM | POA: Diagnosis present

## 2016-02-25 DIAGNOSIS — Z79899 Other long term (current) drug therapy: Secondary | ICD-10-CM

## 2016-02-25 DIAGNOSIS — E669 Obesity, unspecified: Secondary | ICD-10-CM | POA: Diagnosis present

## 2016-02-25 DIAGNOSIS — Z8249 Family history of ischemic heart disease and other diseases of the circulatory system: Secondary | ICD-10-CM

## 2016-02-25 DIAGNOSIS — I25111 Atherosclerotic heart disease of native coronary artery with angina pectoris with documented spasm: Secondary | ICD-10-CM | POA: Diagnosis not present

## 2016-02-25 DIAGNOSIS — J9811 Atelectasis: Secondary | ICD-10-CM | POA: Diagnosis not present

## 2016-02-25 DIAGNOSIS — R0602 Shortness of breath: Secondary | ICD-10-CM | POA: Diagnosis not present

## 2016-02-25 DIAGNOSIS — I25110 Atherosclerotic heart disease of native coronary artery with unstable angina pectoris: Secondary | ICD-10-CM | POA: Diagnosis not present

## 2016-02-25 DIAGNOSIS — Z951 Presence of aortocoronary bypass graft: Secondary | ICD-10-CM

## 2016-02-25 DIAGNOSIS — Z6835 Body mass index (BMI) 35.0-35.9, adult: Secondary | ICD-10-CM

## 2016-02-25 DIAGNOSIS — I252 Old myocardial infarction: Secondary | ICD-10-CM

## 2016-02-25 DIAGNOSIS — Z452 Encounter for adjustment and management of vascular access device: Secondary | ICD-10-CM | POA: Diagnosis not present

## 2016-02-25 DIAGNOSIS — J189 Pneumonia, unspecified organism: Secondary | ICD-10-CM | POA: Diagnosis not present

## 2016-02-25 DIAGNOSIS — I080 Rheumatic disorders of both mitral and aortic valves: Secondary | ICD-10-CM | POA: Diagnosis not present

## 2016-02-25 DIAGNOSIS — M1990 Unspecified osteoarthritis, unspecified site: Secondary | ICD-10-CM | POA: Diagnosis not present

## 2016-02-25 HISTORY — PX: INTRAOPERATIVE TRANSESOPHAGEAL ECHOCARDIOGRAM: SHX5062

## 2016-02-25 HISTORY — PX: CORONARY ARTERY BYPASS GRAFT: SHX141

## 2016-02-25 LAB — CREATININE, SERUM
Creatinine, Ser: 0.85 mg/dL (ref 0.61–1.24)
GFR calc non Af Amer: >60 mL/min (ref >60)

## 2016-02-25 LAB — POCT I-STAT 3, ART BLOOD GAS (G3+)
ACID-BASE DEFICIT: 4 mmol/L — AB (ref 0.0–2.0)
ACID-BASE DEFICIT: 2 mmol/L (ref 0.0–2.0)
ACID-BASE DEFICIT: 1 mmol/L (ref 0.0–2.0)
Acid-base deficit: 1 mmol/L (ref 0.0–2.0)
Acid-base deficit: 1 mmol/L (ref 0.0–2.0)
BICARBONATE: 24.2 meq/L — AB (ref 20.0–24.0)
Bicarbonate: 25.3 mEq/L — ABNORMAL HIGH (ref 20.0–24.0)
Bicarbonate: 23.4 mEq/L (ref 20.0–24.0)
Bicarbonate: 23.7 mEq/L (ref 20.0–24.0)
Bicarbonate: 23.5 mEq/L (ref 20.0–24.0)
O2 SAT: 92 %
O2 Saturation: 100 %
O2 Saturation: 91 %
O2 Saturation: 94 %
O2 Saturation: 94 %
PCO2 ART: 50 mmHg — AB (ref 35.0–45.0)
PCO2 ART: 40 mmHg (ref 35.0–45.0)
PH ART: 7.313 — AB (ref 7.350–7.450)
PH ART: 7.276 — AB (ref 7.350–7.450)
PH ART: 7.298 — AB (ref 7.350–7.450)
PH ART: 7.379 (ref 7.350–7.450)
PO2 ART: 75 mmHg — AB (ref 80.0–100.0)
Patient temperature: 37.7
TCO2: 27 mmol/L (ref 0–100)
TCO2: 25 mmol/L (ref 0–100)
TCO2: 26 mmol/L (ref 0–100)
TCO2: 25 mmol/L (ref 0–100)
TCO2: 25 mmol/L (ref 0–100)
pCO2 arterial: 49.6 mmHg — ABNORMAL HIGH (ref 35.0–45.0)
pCO2 arterial: 49.5 mmHg — ABNORMAL HIGH (ref 35.0–45.0)
pCO2 arterial: 39.5 mmHg (ref 35.0–45.0)
pH, Arterial: 7.388 (ref 7.350–7.450)
pO2, Arterial: 295 mmHg — ABNORMAL HIGH (ref 80.0–100.0)
pO2, Arterial: 69 mmHg — ABNORMAL LOW (ref 80.0–100.0)
pO2, Arterial: 70 mmHg — ABNORMAL LOW (ref 80.0–100.0)
pO2, Arterial: 74 mmHg — ABNORMAL LOW (ref 80.0–100.0)

## 2016-02-25 LAB — CBC
HCT: 33.7 % — ABNORMAL LOW (ref 39.0–52.0)
HCT: 33.6 % — ABNORMAL LOW (ref 39.0–52.0)
HEMOGLOBIN: 11.4 g/dL — AB (ref 13.0–17.0)
Hemoglobin: 11.2 g/dL — ABNORMAL LOW (ref 13.0–17.0)
MCH: 30.7 pg (ref 26.0–34.0)
MCH: 31.1 pg (ref 26.0–34.0)
MCHC: 33.2 g/dL (ref 30.0–36.0)
MCHC: 33.9 g/dL (ref 30.0–36.0)
MCV: 92.3 fL (ref 78.0–100.0)
MCV: 91.6 fL (ref 78.0–100.0)
PLATELETS: 104 10*3/uL — AB (ref 150–400)
Platelets: 107 10*3/uL — ABNORMAL LOW (ref 150–400)
RBC: 3.65 MIL/uL — AB (ref 4.22–5.81)
RBC: 3.67 MIL/uL — AB (ref 4.22–5.81)
RDW: 12.4 % (ref 11.5–15.5)
RDW: 12.6 % (ref 11.5–15.5)
WBC: 11 10*3/uL — ABNORMAL HIGH (ref 4.0–10.5)
WBC: 14.1 10*3/uL — ABNORMAL HIGH (ref 4.0–10.5)

## 2016-02-25 LAB — POCT I-STAT, CHEM 8
BUN: 19 mg/dL (ref 6–20)
BUN: 25 mg/dL — ABNORMAL HIGH (ref 6–20)
BUN: 15 mg/dL (ref 6–20)
BUN: 15 mg/dL (ref 6–20)
BUN: 16 mg/dL (ref 6–20)
BUN: 12 mg/dL (ref 6–20)
CALCIUM ION: 1.21 mmol/L (ref 1.12–1.23)
CALCIUM ION: 1.13 mmol/L (ref 1.12–1.23)
CALCIUM ION: 1.03 mmol/L — AB (ref 1.12–1.23)
CHLORIDE: 102 mmol/L (ref 101–111)
CREATININE: 0.8 mg/dL (ref 0.61–1.24)
CREATININE: 0.8 mg/dL (ref 0.61–1.24)
CREATININE: 0.8 mg/dL (ref 0.61–1.24)
Calcium, Ion: 1.04 mmol/L — ABNORMAL LOW (ref 1.12–1.23)
Calcium, Ion: 1.05 mmol/L — ABNORMAL LOW (ref 1.12–1.23)
Calcium, Ion: 1.11 mmol/L — ABNORMAL LOW (ref 1.12–1.23)
Chloride: 101 mmol/L (ref 101–111)
Chloride: 101 mmol/L (ref 101–111)
Chloride: 94 mmol/L — ABNORMAL LOW (ref 101–111)
Chloride: 102 mmol/L (ref 101–111)
Chloride: 103 mmol/L (ref 101–111)
Creatinine, Ser: 0.9 mg/dL (ref 0.61–1.24)
Creatinine, Ser: 0.7 mg/dL (ref 0.61–1.24)
Creatinine, Ser: 0.8 mg/dL (ref 0.61–1.24)
GLUCOSE: 136 mg/dL — AB (ref 65–99)
GLUCOSE: 169 mg/dL — AB (ref 65–99)
Glucose, Bld: 142 mg/dL — ABNORMAL HIGH (ref 65–99)
Glucose, Bld: 135 mg/dL — ABNORMAL HIGH (ref 65–99)
Glucose, Bld: 141 mg/dL — ABNORMAL HIGH (ref 65–99)
Glucose, Bld: 118 mg/dL — ABNORMAL HIGH (ref 65–99)
HCT: 39 % (ref 39.0–52.0)
HCT: 31 % — ABNORMAL LOW (ref 39.0–52.0)
HCT: 30 % — ABNORMAL LOW (ref 39.0–52.0)
HEMATOCRIT: 42 % (ref 39.0–52.0)
HEMATOCRIT: 30 % — AB (ref 39.0–52.0)
HEMATOCRIT: 33 % — AB (ref 39.0–52.0)
HEMOGLOBIN: 14.3 g/dL (ref 13.0–17.0)
HEMOGLOBIN: 13.3 g/dL (ref 13.0–17.0)
HEMOGLOBIN: 10.5 g/dL — AB (ref 13.0–17.0)
HEMOGLOBIN: 10.2 g/dL — AB (ref 13.0–17.0)
HEMOGLOBIN: 11.2 g/dL — AB (ref 13.0–17.0)
Hemoglobin: 10.2 g/dL — ABNORMAL LOW (ref 13.0–17.0)
POTASSIUM: 5.6 mmol/L — AB (ref 3.5–5.1)
POTASSIUM: 4.4 mmol/L (ref 3.5–5.1)
POTASSIUM: 4.6 mmol/L (ref 3.5–5.1)
Potassium: 4.3 mmol/L (ref 3.5–5.1)
Potassium: 4 mmol/L (ref 3.5–5.1)
Potassium: 4.1 mmol/L (ref 3.5–5.1)
SODIUM: 135 mmol/L (ref 135–145)
SODIUM: 135 mmol/L (ref 135–145)
SODIUM: 138 mmol/L (ref 135–145)
Sodium: 139 mmol/L (ref 135–145)
Sodium: 136 mmol/L (ref 135–145)
Sodium: 140 mmol/L (ref 135–145)
TCO2: 28 mmol/L (ref 0–100)
TCO2: 30 mmol/L (ref 0–100)
TCO2: 26 mmol/L (ref 0–100)
TCO2: 24 mmol/L (ref 0–100)
TCO2: 26 mmol/L (ref 0–100)
TCO2: 25 mmol/L (ref 0–100)

## 2016-02-25 LAB — GLUCOSE, CAPILLARY
GLUCOSE-CAPILLARY: 126 mg/dL — AB (ref 65–99)
GLUCOSE-CAPILLARY: 135 mg/dL — AB (ref 65–99)
Glucose-Capillary: 95 mg/dL (ref 65–99)
Glucose-Capillary: 132 mg/dL — ABNORMAL HIGH (ref 65–99)

## 2016-02-25 LAB — POCT I-STAT 4, (NA,K, GLUC, HGB,HCT)
Glucose, Bld: 103 mg/dL — ABNORMAL HIGH (ref 65–99)
HEMATOCRIT: 33 % — AB (ref 39.0–52.0)
Hemoglobin: 11.2 g/dL — ABNORMAL LOW (ref 13.0–17.0)
Potassium: 3.8 mmol/L (ref 3.5–5.1)
Sodium: 140 mmol/L (ref 135–145)

## 2016-02-25 LAB — MAGNESIUM: MAGNESIUM: 2.8 mg/dL — AB (ref 1.7–2.4)

## 2016-02-25 LAB — PROTIME-INR
INR: 1.35 (ref 0.00–1.49)
Prothrombin Time: 16.8 seconds — ABNORMAL HIGH (ref 11.6–15.2)

## 2016-02-25 LAB — HEMOGLOBIN AND HEMATOCRIT, BLOOD
HEMATOCRIT: 31.5 % — AB (ref 39.0–52.0)
HEMOGLOBIN: 10.7 g/dL — AB (ref 13.0–17.0)

## 2016-02-25 LAB — PLATELET COUNT: Platelets: 130 10*3/uL — ABNORMAL LOW (ref 150–400)

## 2016-02-25 LAB — APTT: aPTT: 38 seconds — ABNORMAL HIGH (ref 24–37)

## 2016-02-25 SURGERY — CORONARY ARTERY BYPASS GRAFTING (CABG)
Anesthesia: General | Site: Chest

## 2016-02-25 MED ORDER — TRAMADOL HCL 50 MG PO TABS: 50.0000 mg | ORAL_TABLET | ORAL | Status: DC | PRN

## 2016-02-25 MED ORDER — METOPROLOL TARTRATE 25 MG/10 ML ORAL SUSPENSION
12.5000 mg | Freq: Two times a day (BID) | ORAL | Status: DC
  Filled 2016-02-25: qty 5

## 2016-02-25 MED ORDER — NITROGLYCERIN IN D5W 200-5 MCG/ML-% IV SOLN: 0.0000 ug/min | INTRAVENOUS | Status: DC

## 2016-02-25 MED ORDER — MIDAZOLAM HCL 5 MG/5ML IJ SOLN
INTRAMUSCULAR | Status: DC | PRN
  Administered 2016-02-25: 3 mg via INTRAVENOUS
  Administered 2016-02-25 (×3): 1 mg via INTRAVENOUS

## 2016-02-25 MED ORDER — SODIUM CHLORIDE 0.9 % IV SOLN
10.0000 g | INTRAVENOUS | Status: DC | PRN
  Administered 2016-02-25: 5 g/h via INTRAVENOUS

## 2016-02-25 MED ORDER — LACTATED RINGERS IV SOLN: 500.0000 mL | Freq: Once | INTRAVENOUS | Status: DC | PRN

## 2016-02-25 MED ORDER — FAMOTIDINE IN NACL 20-0.9 MG/50ML-% IV SOLN
20.0000 mg | Freq: Two times a day (BID) | INTRAVENOUS | Status: AC
  Administered 2016-02-25: 20 mg via INTRAVENOUS
  Filled 2016-02-25: qty 50

## 2016-02-25 MED ORDER — OMEGA-3-ACID ETHYL ESTERS 1 G PO CAPS
1.0000 g | ORAL_CAPSULE | Freq: Every day | ORAL | Status: DC
  Administered 2016-02-27 – 2016-02-29 (×3): 1 g via ORAL
  Filled 2016-02-25 (×4): qty 1

## 2016-02-25 MED ORDER — PHENYLEPHRINE HCL 10 MG/ML IJ SOLN
0.0000 ug/min | INTRAMUSCULAR | Status: DC
  Filled 2016-02-25 (×2): qty 2

## 2016-02-25 MED ORDER — ONDANSETRON HCL 4 MG/2ML IJ SOLN: 4.0000 mg | Freq: Four times a day (QID) | INTRAMUSCULAR | Status: DC | PRN

## 2016-02-25 MED ORDER — EZETIMIBE 10 MG PO TABS
10.0000 mg | ORAL_TABLET | Freq: Every day | ORAL | Status: DC
  Administered 2016-02-26 – 2016-02-29 (×4): 10 mg via ORAL
  Filled 2016-02-25 (×4): qty 1

## 2016-02-25 MED ORDER — DEXTROSE 5 % IV SOLN
0.0000 ug/min | INTRAVENOUS | Status: DC
  Administered 2016-02-25: 2 ug/min via INTRAVENOUS
  Filled 2016-02-25 (×2): qty 4

## 2016-02-25 MED ORDER — DEXTROSE 5 % IV SOLN
1.5000 g | Freq: Two times a day (BID) | INTRAVENOUS | Status: AC
  Administered 2016-02-25 – 2016-02-27 (×4): 1.5 g via INTRAVENOUS
  Filled 2016-02-25 (×4): qty 1.5

## 2016-02-25 MED ORDER — SODIUM CHLORIDE 0.9 % IV SOLN: 250.0000 mL | INTRAVENOUS | Status: DC

## 2016-02-25 MED ORDER — FENTANYL CITRATE (PF) 100 MCG/2ML IJ SOLN
INTRAMUSCULAR | Status: DC | PRN
  Administered 2016-02-25 (×2): 250 ug via INTRAVENOUS
  Administered 2016-02-25: 150 ug via INTRAVENOUS
  Administered 2016-02-25: 50 ug via INTRAVENOUS
  Administered 2016-02-25: 250 ug via INTRAVENOUS
  Administered 2016-02-25: 50 ug via INTRAVENOUS
  Administered 2016-02-25: 250 ug via INTRAVENOUS

## 2016-02-25 MED ORDER — METOPROLOL TARTRATE 12.5 MG HALF TABLET
12.5000 mg | ORAL_TABLET | Freq: Two times a day (BID) | ORAL | Status: DC
  Administered 2016-02-26 – 2016-02-28 (×4): 12.5 mg via ORAL
  Filled 2016-02-25 (×5): qty 1

## 2016-02-25 MED ORDER — ROCURONIUM BROMIDE 50 MG/5ML IV SOLN
INTRAVENOUS | Status: AC
  Filled 2016-02-25: qty 2

## 2016-02-25 MED ORDER — ACETAMINOPHEN 160 MG/5ML PO SOLN: 1000.0000 mg | Freq: Four times a day (QID) | ORAL | Status: DC

## 2016-02-25 MED ORDER — ASPIRIN EC 325 MG PO TBEC: 325.0000 mg | DELAYED_RELEASE_TABLET | Freq: Every day | ORAL | Status: DC

## 2016-02-25 MED ORDER — FENTANYL CITRATE (PF) 250 MCG/5ML IJ SOLN
INTRAMUSCULAR | Status: AC
  Filled 2016-02-25: qty 5

## 2016-02-25 MED ORDER — ACETAMINOPHEN 160 MG/5ML PO SOLN
650.0000 mg | Freq: Once | ORAL | Status: AC
  Administered 2016-02-25: 650 mg

## 2016-02-25 MED ORDER — PROPOFOL 10 MG/ML IV BOLUS
INTRAVENOUS | Status: DC | PRN
  Administered 2016-02-25: 150 mg via INTRAVENOUS

## 2016-02-25 MED ORDER — MORPHINE SULFATE (PF) 2 MG/ML IV SOLN
2.0000 mg | INTRAVENOUS | Status: DC | PRN
  Administered 2016-02-26: 2 mg via INTRAVENOUS
  Administered 2016-02-26: 4 mg via INTRAVENOUS
  Administered 2016-02-26 – 2016-02-27 (×4): 2 mg via INTRAVENOUS
  Filled 2016-02-25 (×9): qty 1

## 2016-02-25 MED ORDER — ALBUMIN HUMAN 5 % IV SOLN
250.0000 mL | INTRAVENOUS | Status: AC | PRN
  Administered 2016-02-25: 250 mL via INTRAVENOUS
  Filled 2016-02-25: qty 250

## 2016-02-25 MED ORDER — HEMOSTATIC AGENTS (NO CHARGE) OPTIME
TOPICAL | Status: DC | PRN
  Administered 2016-02-25: 1 via TOPICAL

## 2016-02-25 MED ORDER — ANTISEPTIC ORAL RINSE SOLUTION (CORINZ)
7.0000 mL | Freq: Four times a day (QID) | OROMUCOSAL | Status: DC
  Administered 2016-02-25 – 2016-02-27 (×5): 7 mL via OROMUCOSAL

## 2016-02-25 MED ORDER — LACTATED RINGERS IV SOLN: INTRAVENOUS | Status: DC

## 2016-02-25 MED ORDER — SODIUM BICARBONATE 8.4 % IV SOLN
50.0000 meq | Freq: Once | INTRAVENOUS | Status: AC
  Administered 2016-02-25: 50 meq via INTRAVENOUS

## 2016-02-25 MED ORDER — FENTANYL CITRATE (PF) 250 MCG/5ML IJ SOLN
INTRAMUSCULAR | Status: AC
  Filled 2016-02-25: qty 30

## 2016-02-25 MED ORDER — DOCUSATE SODIUM 100 MG PO CAPS
200.0000 mg | ORAL_CAPSULE | Freq: Every day | ORAL | Status: DC
  Administered 2016-02-26 – 2016-02-29 (×4): 200 mg via ORAL
  Filled 2016-02-25 (×4): qty 2

## 2016-02-25 MED ORDER — CHLORHEXIDINE GLUCONATE 0.12% ORAL RINSE (MEDLINE KIT)
15.0000 mL | Freq: Two times a day (BID) | OROMUCOSAL | Status: DC
  Administered 2016-02-25 – 2016-02-27 (×4): 15 mL via OROMUCOSAL

## 2016-02-25 MED ORDER — HEPARIN SODIUM (PORCINE) 1000 UNIT/ML IJ SOLN
INTRAMUSCULAR | Status: AC
  Filled 2016-02-25: qty 1

## 2016-02-25 MED ORDER — SODIUM CHLORIDE 0.9 % IV SOLN
250.0000 [IU] | INTRAVENOUS | Status: DC | PRN
  Administered 2016-02-25: 1 [IU]/h via INTRAVENOUS

## 2016-02-25 MED ORDER — DOXAZOSIN MESYLATE 2 MG PO TABS
4.0000 mg | ORAL_TABLET | Freq: Every day | ORAL | Status: DC
  Administered 2016-02-26 – 2016-02-28 (×3): 4 mg via ORAL
  Filled 2016-02-25 (×2): qty 1
  Filled 2016-02-25 (×3): qty 2

## 2016-02-25 MED ORDER — SUCCINYLCHOLINE CHLORIDE 200 MG/10ML IV SOSY
PREFILLED_SYRINGE | INTRAVENOUS | Status: AC
  Filled 2016-02-25: qty 10

## 2016-02-25 MED ORDER — CHLORHEXIDINE GLUCONATE 0.12 % MT SOLN
15.0000 mL | OROMUCOSAL | Status: AC
  Administered 2016-02-25: 15 mL via OROMUCOSAL

## 2016-02-25 MED ORDER — SODIUM CHLORIDE 0.45 % IV SOLN: INTRAVENOUS | Status: DC | PRN

## 2016-02-25 MED ORDER — ARTIFICIAL TEARS OP OINT
TOPICAL_OINTMENT | OPHTHALMIC | Status: AC
  Filled 2016-02-25: qty 3.5

## 2016-02-25 MED ORDER — LACTATED RINGERS IV SOLN
INTRAVENOUS | Status: DC | PRN
  Administered 2016-02-25: 07:00:00 via INTRAVENOUS

## 2016-02-25 MED ORDER — PROPOFOL 10 MG/ML IV BOLUS
INTRAVENOUS | Status: AC
  Filled 2016-02-25: qty 20

## 2016-02-25 MED ORDER — CHLORHEXIDINE GLUCONATE 0.12 % MT SOLN: 15.0000 mL | Freq: Once | OROMUCOSAL | Status: DC

## 2016-02-25 MED ORDER — SODIUM CHLORIDE 0.9% FLUSH
3.0000 mL | Freq: Two times a day (BID) | INTRAVENOUS | Status: DC
  Administered 2016-02-27: 3 mL via INTRAVENOUS

## 2016-02-25 MED ORDER — PHENYLEPHRINE HCL 10 MG/ML IJ SOLN
INTRAMUSCULAR | Status: AC
  Filled 2016-02-25: qty 1

## 2016-02-25 MED ORDER — LATANOPROST 0.005 % OP SOLN
1.0000 [drp] | Freq: Every day | OPHTHALMIC | Status: DC
  Administered 2016-02-25 – 2016-02-29 (×4): 1 [drp] via OPHTHALMIC
  Filled 2016-02-25: qty 2.5

## 2016-02-25 MED ORDER — MAGNESIUM SULFATE 4 GM/100ML IV SOLN
4.0000 g | Freq: Once | INTRAVENOUS | Status: AC
  Administered 2016-02-25: 4 g via INTRAVENOUS
  Filled 2016-02-25: qty 100

## 2016-02-25 MED ORDER — POTASSIUM CHLORIDE 10 MEQ/50ML IV SOLN
10.0000 meq | INTRAVENOUS | Status: AC
  Administered 2016-02-25 (×3): 10 meq via INTRAVENOUS

## 2016-02-25 MED ORDER — SODIUM CHLORIDE 0.9 % IV SOLN: INTRAVENOUS | Status: DC

## 2016-02-25 MED ORDER — SODIUM CHLORIDE 0.9 % IV SOLN
1.0000 g | Freq: Once | INTRAVENOUS | Status: AC
  Administered 2016-02-25: 1 g via INTRAVENOUS
  Filled 2016-02-25: qty 10

## 2016-02-25 MED ORDER — ALBUMIN HUMAN 5 % IV SOLN
INTRAVENOUS | Status: DC | PRN
  Administered 2016-02-25 (×2): via INTRAVENOUS

## 2016-02-25 MED ORDER — SUCCINYLCHOLINE CHLORIDE 20 MG/ML IJ SOLN
INTRAMUSCULAR | Status: DC | PRN
  Administered 2016-02-25: 120 mg via INTRAVENOUS

## 2016-02-25 MED ORDER — HEPARIN SODIUM (PORCINE) 1000 UNIT/ML IJ SOLN
INTRAMUSCULAR | Status: AC
  Filled 2016-02-25: qty 3

## 2016-02-25 MED ORDER — PROTAMINE SULFATE 10 MG/ML IV SOLN
INTRAVENOUS | Status: DC | PRN
  Administered 2016-02-25: 50 mg via INTRAVENOUS
  Administered 2016-02-25: 30 mg via INTRAVENOUS
  Administered 2016-02-25: 40 mg via INTRAVENOUS
  Administered 2016-02-25 (×2): 30 mg via INTRAVENOUS

## 2016-02-25 MED ORDER — BISACODYL 5 MG PO TBEC
10.0000 mg | DELAYED_RELEASE_TABLET | Freq: Every day | ORAL | Status: DC
  Administered 2016-02-26 – 2016-02-29 (×4): 10 mg via ORAL
  Filled 2016-02-25 (×4): qty 2

## 2016-02-25 MED ORDER — ROSUVASTATIN CALCIUM 10 MG PO TABS
10.0000 mg | ORAL_TABLET | ORAL | Status: DC
  Administered 2016-02-27: 10 mg via ORAL
  Filled 2016-02-25 (×2): qty 1

## 2016-02-25 MED ORDER — PANTOPRAZOLE SODIUM 40 MG PO TBEC
40.0000 mg | DELAYED_RELEASE_TABLET | Freq: Every day | ORAL | Status: DC
  Administered 2016-02-27 – 2016-02-29 (×3): 40 mg via ORAL
  Filled 2016-02-25 (×3): qty 1

## 2016-02-25 MED ORDER — ASPIRIN 81 MG PO CHEW: 324.0000 mg | CHEWABLE_TABLET | Freq: Every day | ORAL | Status: DC

## 2016-02-25 MED ORDER — PHENYLEPHRINE 40 MCG/ML (10ML) SYRINGE FOR IV PUSH (FOR BLOOD PRESSURE SUPPORT)
PREFILLED_SYRINGE | INTRAVENOUS | Status: AC
Start: 2016-02-25 — End: 2016-02-25
  Filled 2016-02-25: qty 10

## 2016-02-25 MED ORDER — PHENYLEPHRINE HCL 10 MG/ML IJ SOLN
10.0000 mg | INTRAVENOUS | Status: DC | PRN
  Administered 2016-02-25: 30 ug/min via INTRAVENOUS

## 2016-02-25 MED ORDER — LIDOCAINE 2% (20 MG/ML) 5 ML SYRINGE
INTRAMUSCULAR | Status: AC
  Filled 2016-02-25: qty 5

## 2016-02-25 MED ORDER — BISACODYL 10 MG RE SUPP
10.0000 mg | Freq: Every day | RECTAL | Status: DC
  Filled 2016-02-25: qty 1

## 2016-02-25 MED ORDER — DEXMEDETOMIDINE HCL IN NACL 200 MCG/50ML IV SOLN
0.0000 ug/kg/h | INTRAVENOUS | Status: DC
  Filled 2016-02-25: qty 50

## 2016-02-25 MED ORDER — SODIUM CHLORIDE 0.9 % IV SOLN
INTRAVENOUS | Status: DC
  Administered 2016-02-25: 2.3 [IU]/h via INTRAVENOUS
  Filled 2016-02-25: qty 2.5

## 2016-02-25 MED ORDER — OXYCODONE HCL 5 MG PO TABS
5.0000 mg | ORAL_TABLET | ORAL | Status: DC | PRN
  Administered 2016-02-26: 5 mg via ORAL
  Administered 2016-02-26 – 2016-02-27 (×5): 10 mg via ORAL
  Administered 2016-02-27: 5 mg via ORAL
  Administered 2016-02-27: 10 mg via ORAL
  Filled 2016-02-25: qty 1
  Filled 2016-02-25 (×6): qty 2
  Filled 2016-02-25: qty 1

## 2016-02-25 MED ORDER — 0.9 % SODIUM CHLORIDE (POUR BTL) OPTIME
TOPICAL | Status: DC | PRN
  Administered 2016-02-25: 6000 mL

## 2016-02-25 MED ORDER — MORPHINE SULFATE (PF) 2 MG/ML IV SOLN
1.0000 mg | INTRAVENOUS | Status: AC | PRN
  Administered 2016-02-25: 1 mg via INTRAVENOUS
  Filled 2016-02-25: qty 1

## 2016-02-25 MED ORDER — SODIUM CHLORIDE 0.9% FLUSH: 3.0000 mL | INTRAVENOUS | Status: DC | PRN

## 2016-02-25 MED ORDER — VANCOMYCIN HCL IN DEXTROSE 1-5 GM/200ML-% IV SOLN
1000.0000 mg | Freq: Once | INTRAVENOUS | Status: AC
  Administered 2016-02-25: 1000 mg via INTRAVENOUS
  Filled 2016-02-25: qty 200

## 2016-02-25 MED ORDER — METOPROLOL TARTRATE 5 MG/5ML IV SOLN: 2.5000 mg | INTRAVENOUS | Status: DC | PRN

## 2016-02-25 MED ORDER — PHENYLEPHRINE 40 MCG/ML (10ML) SYRINGE FOR IV PUSH (FOR BLOOD PRESSURE SUPPORT)
PREFILLED_SYRINGE | INTRAVENOUS | Status: AC
Start: 2016-02-25 — End: 2016-02-25
  Filled 2016-02-25: qty 20

## 2016-02-25 MED ORDER — DOPAMINE-DEXTROSE 3.2-5 MG/ML-% IV SOLN: 0.0000 ug/kg/min | INTRAVENOUS | Status: DC

## 2016-02-25 MED ORDER — MIDAZOLAM HCL 10 MG/2ML IJ SOLN
INTRAMUSCULAR | Status: AC
  Filled 2016-02-25: qty 2

## 2016-02-25 MED ORDER — ACETAMINOPHEN 650 MG RE SUPP: 650.0000 mg | Freq: Once | RECTAL | Status: AC

## 2016-02-25 MED ORDER — CHLORHEXIDINE GLUCONATE 4 % EX LIQD: 30.0000 mL | CUTANEOUS | Status: DC

## 2016-02-25 MED ORDER — SODIUM CHLORIDE 0.9 % IJ SOLN
INTRAMUSCULAR | Status: DC | PRN
  Administered 2016-02-25 (×3): 4 mL via TOPICAL

## 2016-02-25 MED ORDER — FINASTERIDE 5 MG PO TABS
5.0000 mg | ORAL_TABLET | Freq: Every day | ORAL | Status: DC
  Administered 2016-02-26 – 2016-02-29 (×4): 5 mg via ORAL
  Filled 2016-02-25 (×4): qty 1

## 2016-02-25 MED ORDER — NITROGLYCERIN IN D5W 200-5 MCG/ML-% IV SOLN
INTRAVENOUS | Status: DC | PRN
  Administered 2016-02-25: 5 ug/min via INTRAVENOUS

## 2016-02-25 MED ORDER — MIDAZOLAM HCL 2 MG/2ML IJ SOLN: 2.0000 mg | INTRAMUSCULAR | Status: DC | PRN

## 2016-02-25 MED ORDER — ACETAMINOPHEN 500 MG PO TABS
1000.0000 mg | ORAL_TABLET | Freq: Four times a day (QID) | ORAL | Status: DC
  Administered 2016-02-25 – 2016-02-29 (×14): 1000 mg via ORAL
  Filled 2016-02-25 (×16): qty 2

## 2016-02-25 MED ORDER — LACTATED RINGERS IV SOLN
INTRAVENOUS | Status: DC | PRN
  Administered 2016-02-25: 08:00:00 via INTRAVENOUS

## 2016-02-25 MED ORDER — ROCURONIUM BROMIDE 100 MG/10ML IV SOLN
INTRAVENOUS | Status: DC | PRN
  Administered 2016-02-25: 100 mg via INTRAVENOUS
  Administered 2016-02-25 (×2): 50 mg via INTRAVENOUS

## 2016-02-25 MED ORDER — INSULIN REGULAR BOLUS VIA INFUSION
0.0000 [IU] | Freq: Three times a day (TID) | INTRAVENOUS | Status: DC
  Filled 2016-02-25: qty 10

## 2016-02-25 MED ORDER — PROTAMINE SULFATE 10 MG/ML IV SOLN
INTRAVENOUS | Status: AC
Start: 2016-02-25 — End: 2016-02-25
  Filled 2016-02-25: qty 25

## 2016-02-25 MED ORDER — ALBUMIN HUMAN 5 % IV SOLN
12.5000 g | Freq: Once | INTRAVENOUS | Status: AC
  Administered 2016-02-25: 12.5 g via INTRAVENOUS

## 2016-02-25 MED ORDER — HEPARIN SODIUM (PORCINE) 1000 UNIT/ML IJ SOLN
INTRAMUSCULAR | Status: DC | PRN
  Administered 2016-02-25: 15000 [IU] via INTRAVENOUS
  Administered 2016-02-25: 2000 [IU] via INTRAVENOUS

## 2016-02-25 SURGICAL SUPPLY — 97 items
ADH SKN CLS APL DERMABOND .7 (GAUZE/BANDAGES/DRESSINGS) ×1
BAG DECANTER FOR FLEXI CONT (MISCELLANEOUS) ×3 IMPLANT
BANDAGE ACE 4X5 VEL STRL LF (GAUZE/BANDAGES/DRESSINGS) ×2 IMPLANT
BANDAGE ELASTIC 4 VELCRO ST LF (GAUZE/BANDAGES/DRESSINGS) ×3 IMPLANT
BANDAGE ELASTIC 6 VELCRO ST LF (GAUZE/BANDAGES/DRESSINGS) ×3 IMPLANT
BASKET HEART  (ORDER IN 25'S) (MISCELLANEOUS) ×1
BASKET HEART (ORDER IN 25'S) (MISCELLANEOUS) ×1
BASKET HEART (ORDER IN 25S) (MISCELLANEOUS) ×1 IMPLANT
BLADE STERNUM SYSTEM 6 (BLADE) ×3 IMPLANT
BLADE SURG 15 STRL LF DISP TIS (BLADE) IMPLANT
BLADE SURG 15 STRL SS (BLADE) ×3
BNDG GAUZE ELAST 4 BULKY (GAUZE/BANDAGES/DRESSINGS) ×3 IMPLANT
CANISTER SUCTION 2500CC (MISCELLANEOUS) ×3 IMPLANT
CANNULA EZ GLIDE AORTIC 21FR (CANNULA) ×3 IMPLANT
CATH CPB KIT HENDRICKSON (MISCELLANEOUS) ×3 IMPLANT
CATH ROBINSON RED A/P 18FR (CATHETERS) ×3 IMPLANT
CATH THORACIC 36FR (CATHETERS) ×3 IMPLANT
CATH THORACIC 36FR RT ANG (CATHETERS) ×3 IMPLANT
CLIP TI MEDIUM 24 (CLIP) IMPLANT
CLIP TI WIDE RED SMALL 24 (CLIP) ×6 IMPLANT
CRADLE DONUT ADULT HEAD (MISCELLANEOUS) ×3 IMPLANT
DERMABOND ADVANCED (GAUZE/BANDAGES/DRESSINGS) ×2
DERMABOND ADVANCED .7 DNX12 (GAUZE/BANDAGES/DRESSINGS) IMPLANT
DRAPE CARDIOVASCULAR INCISE (DRAPES) ×3
DRAPE SLUSH/WARMER DISC (DRAPES) ×3 IMPLANT
DRAPE SRG 135X102X78XABS (DRAPES) ×1 IMPLANT
DRSG COVADERM 4X14 (GAUZE/BANDAGES/DRESSINGS) ×3 IMPLANT
ELECT REM PT RETURN 9FT ADLT (ELECTROSURGICAL) ×6
ELECTRODE REM PT RTRN 9FT ADLT (ELECTROSURGICAL) ×2 IMPLANT
FELT TEFLON 1X6 (MISCELLANEOUS) ×3 IMPLANT
GAUZE SPONGE 4X4 12PLY STRL (GAUZE/BANDAGES/DRESSINGS) ×6 IMPLANT
GLOVE BIO SURGEON STRL SZ7.5 (GLOVE) ×6 IMPLANT
GLOVE BIOGEL M 6.5 STRL (GLOVE) ×10 IMPLANT
GLOVE BIOGEL M 7.0 STRL (GLOVE) ×4 IMPLANT
GLOVE BIOGEL PI IND STRL 6.5 (GLOVE) IMPLANT
GLOVE BIOGEL PI IND STRL 7.0 (GLOVE) IMPLANT
GLOVE BIOGEL PI INDICATOR 6.5 (GLOVE) ×4
GLOVE BIOGEL PI INDICATOR 7.0 (GLOVE) ×4
GLOVE SURG SIGNA 7.5 PF LTX (GLOVE) ×9 IMPLANT
GOWN STRL REUS W/ TWL LRG LVL3 (GOWN DISPOSABLE) ×4 IMPLANT
GOWN STRL REUS W/ TWL XL LVL3 (GOWN DISPOSABLE) ×2 IMPLANT
GOWN STRL REUS W/TWL LRG LVL3 (GOWN DISPOSABLE) ×24
GOWN STRL REUS W/TWL XL LVL3 (GOWN DISPOSABLE) ×6
HEMOSTAT POWDER SURGIFOAM 1G (HEMOSTASIS) ×9 IMPLANT
HEMOSTAT SURGICEL 2X14 (HEMOSTASIS) ×3 IMPLANT
INSERT FOGARTY XLG (MISCELLANEOUS) IMPLANT
KIT BASIN OR (CUSTOM PROCEDURE TRAY) ×3 IMPLANT
KIT ROOM TURNOVER OR (KITS) ×3 IMPLANT
KIT SUCTION CATH 14FR (SUCTIONS) ×6 IMPLANT
KIT VASOVIEW 6 PRO VH 2400 (KITS) ×3 IMPLANT
MARKER GRAFT CORONARY BYPASS (MISCELLANEOUS) ×9 IMPLANT
NS IRRIG 1000ML POUR BTL (IV SOLUTION) ×17 IMPLANT
PACK OPEN HEART (CUSTOM PROCEDURE TRAY) ×3 IMPLANT
PAD ARMBOARD 7.5X6 YLW CONV (MISCELLANEOUS) ×6 IMPLANT
PAD ELECT DEFIB RADIOL ZOLL (MISCELLANEOUS) ×3 IMPLANT
PENCIL BUTTON HOLSTER BLD 10FT (ELECTRODE) ×3 IMPLANT
PUNCH AORTIC ROTATE  4.5MM 8IN (MISCELLANEOUS) ×2 IMPLANT
PUNCH AORTIC ROTATE 4.0MM (MISCELLANEOUS) IMPLANT
PUNCH AORTIC ROTATE 4.5MM 8IN (MISCELLANEOUS) IMPLANT
PUNCH AORTIC ROTATE 5MM 8IN (MISCELLANEOUS) IMPLANT
SET CARDIOPLEGIA MPS 5001102 (MISCELLANEOUS) ×2 IMPLANT
SPONGE GAUZE 4X4 12PLY STER LF (GAUZE/BANDAGES/DRESSINGS) ×4 IMPLANT
SUT BONE WAX W31G (SUTURE) ×3 IMPLANT
SUT MNCRL AB 3-0 PS2 18 (SUTURE) ×2 IMPLANT
SUT MNCRL AB 4-0 PS2 18 (SUTURE) IMPLANT
SUT PROLENE 3 0 SH DA (SUTURE) ×3 IMPLANT
SUT PROLENE 4 0 RB 1 (SUTURE) ×6
SUT PROLENE 4 0 SH DA (SUTURE) IMPLANT
SUT PROLENE 4-0 RB1 .5 CRCL 36 (SUTURE) IMPLANT
SUT PROLENE 6 0 C 1 30 (SUTURE) ×8 IMPLANT
SUT PROLENE 7 0 BV1 MDA (SUTURE) ×3 IMPLANT
SUT PROLENE 8 0 BV175 6 (SUTURE) IMPLANT
SUT SILK 3 0 REEL (SUTURE) ×2 IMPLANT
SUT SILK 4 0 REEL (SUTURE) ×2 IMPLANT
SUT STEEL 6MS V (SUTURE) ×3 IMPLANT
SUT STEEL STERNAL CCS#1 18IN (SUTURE) IMPLANT
SUT STEEL SZ 6 DBL 3X14 BALL (SUTURE) ×3 IMPLANT
SUT VIC AB 1 CTX 36 (SUTURE) ×6
SUT VIC AB 1 CTX36XBRD ANBCTR (SUTURE) ×2 IMPLANT
SUT VIC AB 2-0 CT1 27 (SUTURE)
SUT VIC AB 2-0 CT1 TAPERPNT 27 (SUTURE) IMPLANT
SUT VIC AB 2-0 CTX 27 (SUTURE) IMPLANT
SUT VIC AB 3-0 SH 27 (SUTURE) ×3
SUT VIC AB 3-0 SH 27X BRD (SUTURE) IMPLANT
SUT VIC AB 3-0 X1 27 (SUTURE) IMPLANT
SUT VICRYL 4-0 PS2 18IN ABS (SUTURE) IMPLANT
SUTURE E-PAK OPEN HEART (SUTURE) ×3 IMPLANT
SYSTEM SAHARA CHEST DRAIN ATS (WOUND CARE) ×3 IMPLANT
TAPE CLOTH SURG 4X10 WHT LF (GAUZE/BANDAGES/DRESSINGS) ×4 IMPLANT
TAPE PAPER 2X10 WHT MICROPORE (GAUZE/BANDAGES/DRESSINGS) ×2 IMPLANT
TOWEL OR 17X24 6PK STRL BLUE (TOWEL DISPOSABLE) ×6 IMPLANT
TOWEL OR 17X26 10 PK STRL BLUE (TOWEL DISPOSABLE) ×6 IMPLANT
TRAY FOLEY IC TEMP SENS 16FR (CATHETERS) ×3 IMPLANT
TUBE FEEDING 8FR 16IN STR KANG (MISCELLANEOUS) ×3 IMPLANT
TUBING INSUFFLATION (TUBING) ×3 IMPLANT
UNDERPAD 30X30 INCONTINENT (UNDERPADS AND DIAPERS) ×3 IMPLANT
WATER STERILE IRR 1000ML POUR (IV SOLUTION) ×6 IMPLANT

## 2016-02-25 NOTE — Progress Notes (Signed)
Pt NIF -34, VC 0.380, ABG on the low side of acceptable per protocol. MD notified and orders received to place pt back on vent with a set rate and attempt wean again later. RN aware of plans. Pt placed on SIMV/PRVC Rate 10, VT 600, 40%, Peep 5, PS 10. RT will continue to monitor.

## 2016-02-25 NOTE — Transfer of Care (Signed)
Immediate Anesthesia Transfer of Care Note  Patient: Curtis Tyler  Procedure(s) Performed: Procedure(s): CORONARY ARTERY BYPASS GRAFTING (CABG) times four using the left internal mammary artery and the right saphenus vein  (N/A) INTRAOPERATIVE TRANSESOPHAGEAL ECHOCARDIOGRAM (N/A)  Patient Location: SICU  Anesthesia Type:General  Level of Consciousness: Patient remains intubated per anesthesia plan  Airway & Oxygen Therapy: Patient remains intubated per anesthesia plan and Patient placed on Ventilator (see vital sign flow sheet for setting)  Post-op Assessment: Report given to RN and Post -op Vital signs reviewed and stable  Post vital signs: Reviewed and stable  Last Vitals:  Filed Vitals:   02/25/16 0611  BP: 151/63  Pulse: 72  Temp: 36.5 C  Resp: 20    Last Pain: There were no vitals filed for this visit.    Patients Stated Pain Goal: 5 ([id number])  Complications: No apparent anesthesia complications

## 2016-02-25 NOTE — H&P (View-Only) (Signed)
PCP is Robyne Peers., MD Referring Provider is Curtis Man, MD  Chief Complaint  Patient presents with  . Coronary Artery Disease    Surgical eval, Cardiac Cath 02/12/16    HPI: Curtis Tyler is sent for consultation regarding left main and three-vessel coronary disease.  He is a 75 year old gentleman with multiple cardiac risk factors including hyperlipidemia, hypertension, and remote tobacco abuse. He also has a family history of cardiac disease. He has a known history of cardiac disease dating back to 23 when he was incidentally found to have had an MI. He is not sure what happened. He had a catheterization at that time, and was treated medically.  He has had several catheterizations over the years and most recently in 2014 he had a 60% left main stenosis he also had 60% stenoses in the LAD and circumflex. He was asymptomatic and had an ejection fraction of 65%. He opted for medical management.  He recently saw Dr. Percival Spanish for an annual visit. He was not having any chest pain or shortness of breath. A stress test showed ST depression and was a high risk study. He then underwent cardiac catheterization where he was found to have a 60% ostial left main, a 90% proximal LAD, 90% proximal circumflex, and a diffusely diseased right coronary with a chronically totally occluded posterior descending.    Past Medical History  Diagnosis Date  . Hyperlipidemia   . Hypertension   . Coronary artery disease     left main 60% stenosis. The LAD had a proximal 60-70% stenosis. There was mid 40% stenosis. Diagonal is moderate size with 40% stenosis. The circumflex system included a large OM with 60% ostial stenosis the RCA was occluded before the PDA. The EF was 65%.  2014  . Obesity   . Carotid artery occlusion   . Myocardial infarct ([place]) 1992    inferior posterior  . Radiculopathy   . Bunion   . Callus     Past Surgical History  Procedure Laterality Date  . Hernia repair  1960    right  hernia repair  . Appendectomy  1958  . Cardiac catheterization  05/2004    SHOWING 100% OCCLUDED DISTAL RIGHT CORONARY WITH  LEFT  TO RIGHT COLLATERALS, AS WELL AS ANTEGRADE FLOW, NORMAL LEFT MAIN, 50% NARROWING IN LEFT CIRCUMFLEX  WITH IRREGULARITIES IN THE LAD  . Coronary angioplasty      RIGHT CORONARY WERE UNSUCCESSFUL  . US echocardiography  01/03/2007    EF 55-60%  . Cardiovascular stress test  02/26/2010    EF 65%, SMALL AREA OF MILD INFARCT AND POSSIBLE PER-INFARCT ISCHEMIA IN THE INFEROBASAL WALL  . Cardiac catheterization N/A 02/12/2016    Procedure: Left Heart Cath and Coronary Angiography;  Surgeon: Curtis Man, MD;  Location: [facility];  Service: Cardiovascular;  Laterality: N/A;    Family History  Problem Relation Age of Onset  . Hypertension Neg Hx   . Stroke Father   . Congestive Heart Failure Mother   . Diabetes Mother     Social History Social History  Substance Use Topics  . Smoking status: Former Smoker -- 1.00 packs/day for 25 years    Types: Cigarettes    Quit date: 08/29/1990  . Smokeless tobacco: Never Used  . Alcohol Use: Yes     Comment: minimal; occasionally    Current Outpatient Prescriptions  Medication Sig Dispense Refill  . amLODipine (NORVASC) 5 MG tablet Take 5 mg by mouth daily.      Marland Kitchen  aspirin 81 MG tablet Take 81 mg by mouth daily.      . clopidogrel (PLAVIX) 75 MG tablet Take 75 mg by mouth daily.      . Coenzyme Q10 (CO Q 10) 100 MG CAPS Take by mouth daily.      . CRESTOR 10 MG tablet Take 10 mg by mouth every other day.     . doxazosin (CARDURA) 4 MG tablet Take 4 mg by mouth at bedtime.      Marland Kitchen ezetimibe (ZETIA) 10 MG tablet Take 10 mg by mouth daily.    . finasteride (PROSCAR) 5 MG tablet Take 5 mg by mouth daily.      . fish oil-omega-3 fatty acids 1000 MG capsule Take 1 g by mouth daily.      Marland Kitchen GLUCOSAMINE-CHONDROITIN PO Take 1 tablet by mouth daily.     Marland Kitchen latanoprost (XALATAN) 0.005 % ophthalmic solution Place 1 drop into  both eyes daily.    Marland Kitchen losartan (COZAAR) 100 MG tablet Take 100 mg by mouth daily.      . Plant Sterols and Stanols (CHOLEST OFF) 450 MG TABS Take 1 tablet by mouth daily.     . tadalafil (CIALIS) 10 MG tablet Take 10 mg by mouth daily as needed for erectile dysfunction.    . traMADol (ULTRAM) 50 MG tablet Take 1 tablet (50 mg total) by mouth every 6 (six) hours as needed. 30 tablet 0   No current facility-administered medications for this visit.    No Known Allergies  Review of Systems  Constitutional: Negative for activity change, appetite change and unexpected weight change.  HENT: Positive for hearing loss. Negative for trouble swallowing and voice change.   Eyes: Negative for visual disturbance.  Respiratory: Negative for chest tightness, shortness of breath and wheezing.   Cardiovascular: Negative for chest pain, palpitations and leg swelling.  Gastrointestinal: Negative for abdominal pain and blood in stool.  Genitourinary: Positive for frequency. Negative for dysuria.       BPH  Musculoskeletal: Positive for myalgias, joint swelling and arthralgias.  Hematological: Negative for adenopathy. Bruises/bleeds easily.  Psychiatric/Behavioral: Negative.   All other systems reviewed and are negative.   Ht 5' 2.5" (1.588 m)  Wt 192 lb (87.091 kg)  BMI 34.54 kg/m2  SpO2  Physical Exam  Constitutional: He is oriented to person, place, and time. He appears well-developed and well-nourished. No distress.  HENT:  Head: Normocephalic and atraumatic.  Mouth/Throat: No oropharyngeal exudate.  Eyes: Conjunctivae and EOM are normal. Pupils are equal, round, and reactive to light. No scleral icterus.  Neck: Neck supple. No thyromegaly present.  Faint bruits bilaterally  Cardiovascular: Normal rate.   Murmur (2/6 systolic) heard. Pulses:      Carotid pulses are 2+ on the right side with bruit, and 2+ on the left side with bruit.      Radial pulses are 2+ on the right side, and 2+ on the  left side.       Dorsalis pedis pulses are 1+ on the right side, and 1+ on the left side.       Posterior tibial pulses are 1+ on the right side, and 1+ on the left side.  Pulmonary/Chest: Effort normal and breath sounds normal. No respiratory distress. He has no wheezes. He has no rales.  Abdominal: Soft. He exhibits no distension. There is no tenderness.  Musculoskeletal: Normal range of motion. He exhibits no edema.  Lymphadenopathy:    He has no cervical adenopathy.  Neurological: He is alert and oriented to person, place, and time. No cranial nerve deficit.  Motor grossly intact  Skin: Skin is warm and dry.  Psychiatric: He has a normal mood and affect.  Vitals reviewed.    Diagnostic Tests: STRESS TEST Study Highlights     Downsloping ST segment depression ST segment depression of 2 mm was noted during stress in the II, III, aVF, V4, V5 and V6 leads. There is ST elevation as well in aVR.  Abnormal high risk exercise treadmill test with electrocardiographic evidence of ischemia, possibly global ischemia.  Blood pressure overall decreased as exercise intensified. Shortness of breath.  Candee Furbish, MD   CARDIAC CATHETERIZATION Conclusion     Ost LM lesion, 60% stenosed. LM lesion, 50% stenosed. Calcified  Prox LAD to Mid LAD lesion, 90% stenosed. Calcified - napkin-ring like lesion between D1 & D2.  Prox Cx lesion, 95% stenosed. Calcified lesion at the takeoff of OM1  Prox RCA to Dist RCA lesion, 60% stenosed.  Dist RCA lesion, 100% stenosed. Known CTO with bridging collaterals and left-to-right collaterals filling the distal RCA system  The left ventricular systolic function is normal.  Normal LVEDP    I personally reviewed the cardiac catheterization films with findings as noted above. He has left main and severe three-vessel coronary disease.   Impression: Mr. Beyler is a 75 year old gentleman with a long-standing history of coronary disease and multiple  cardiac risk factors. He is asymptomatic, but had a high-risk stress test. A catheterization he was found to have left main and three-vessel disease with severe stenoses in the LAD and proximal circumflex in addition to a diffusely diseased right coronary and a chronically totally occluded posterior descending. Left ventricular function is preserved.  Coronary bypass grafting is indicated for survival benefit.  I have discussed the general nature of the procedure, the need for general anesthesia, the use of cardiopulmonary bypass and the incisions to be used with Mr and Mrs. Banh and their son. We discussed the expected hospital stay, overall recovery and short and long term outcomes. I informed them of the indications, risks, benefits, and alternatives. They understand the risks include, but are not limited to death, stroke, MI, DVT/PE, bleeding, possible need for transfusion, infections(typical or atypical), cardiac arrhythmias, as well as other organ system dysfunction including respiratory, renal, or GI complications.   He accepts the risks and agrees to proceed.  He is on Plavix for his moderate carotid disease. He needs to be off that for at least 5 days prior to surgery.  Plan: Stop plavix after dose on 02/18/2016  CABG on Thursday 02/25/2016  [name], MD [facility] [phone]

## 2016-02-25 NOTE — Progress Notes (Signed)
Notified Dr. Roxan Hockey of poor ABG, NIF and VC. Gave verbal orders to place patient back on a rate, give 1 amp of bicarb and give patient more time.   Rowe Pavy, RN

## 2016-02-25 NOTE — Anesthesia Procedure Notes (Addendum)
Central Venous Catheter Insertion Performed by: anesthesiologist Patient location: Pre-op. Preanesthetic checklist: patient identified, IV checked, site marked, risks and benefits discussed, surgical consent, monitors and equipment checked, pre-op evaluation, timeout performed and anesthesia consent Position: Trendelenburg Lidocaine 1% used for infiltration Landmarks identified Catheter size: 8.5 Fr PA cath was placed.Sheath introducer Swan type and PA catheter depth:thermodilationProcedure performed using ultrasound guided technique. Attempts: 1 Following insertion, line sutured and Biopatch. Post procedure assessment: free fluid flow. Patient tolerated the procedure well with no immediate complications.   Procedure Name: Intubation Date/Time: 02/25/2016 8:35 AM Performed by: Ollen Bowl Pre-anesthesia Checklist: Patient identified, Emergency Drugs available, Suction available, Patient being monitored and Timeout performed Patient Re-evaluated:Patient Re-evaluated prior to inductionOxygen Delivery Method: Circle system utilized and Simple face mask Preoxygenation: Pre-oxygenation with 100% oxygen Intubation Type: IV induction Ventilation: Mask ventilation without difficulty Laryngoscope Size: Miller and 2 Grade View: Grade I Tube type: Oral Tube size: 8.0 mm Number of attempts: 1 Airway Equipment and Method: Patient positioned with wedge pillow and Stylet Placement Confirmation: ETT inserted through vocal cords under direct vision,  positive ETCO2 and breath sounds checked- equal and bilateral Secured at: 22 cm Tube secured with: Tape Dental Injury: Teeth and Oropharynx as per pre-operative assessment

## 2016-02-25 NOTE — Progress Notes (Signed)
Initiated open heart rapid wean per protocol again. RT will continue to monitor.

## 2016-02-25 NOTE — Procedures (Signed)
Extubation Procedure Note  Patient Details:   Name: Curtis Tyler DOB: 1941/06/09 MRN: [id number]   Airway Documentation:  Airway 8 mm (Active)  Secured at (cm) 22 cm 02/25/2016  1:23 PM  Measured From Lips 02/25/2016  1:23 PM  Secured Location Right 02/25/2016  1:23 PM  Secured By Pink Tape 02/25/2016  1:23 PM  Site Condition Dry 02/25/2016  1:23 PM    Evaluation  O2 sats: stable throughout Complications: No apparent complications Patient did tolerate procedure well. Bilateral Breath Sounds: Clear, Diminished   Yes, pt able to cough to clear secretions and able to vocalize name. Pt had NIF of -40 and VC of 650. Pt also positive for cuff leak before extubation. Placed on 5L nasal cannula and tolerating well at this time  Virgilio Frees 02/25/2016, 7:48 PM

## 2016-02-25 NOTE — Progress Notes (Signed)
  Echocardiogram Echocardiogram Transesophageal has been performed.  Diamond Nickel 02/25/2016, 9:14 AM

## 2016-02-25 NOTE — Progress Notes (Signed)
Initiated Open Heart Rapid Wean per Protocol. Rt will continue to monitor.

## 2016-02-25 NOTE — Progress Notes (Signed)
Patient ID: Curtis Tyler, male   DOB: 03-22-1941, 75 y.o.   MRN: [id number] EVENING ROUNDS NOTE :     [address]       [place] [address]             [phone]                 Day of Surgery Procedure(s) (LRB): CORONARY ARTERY BYPASS GRAFTING (CABG) times four using the left internal mammary artery and the right saphenus vein  (N/A) INTRAOPERATIVE TRANSESOPHAGEAL ECHOCARDIOGRAM (N/A)  Total Length of Stay:  LOS: 0 days  BP 95/57 mmHg  Pulse 89  Temp(Src) 99.1 F (37.3 C)  Resp 19  Ht 5' 2.5" (1.588 m)  Wt 199 lb (90.266 kg)  BMI 35.80 kg/m2  SpO2 96%  .Intake/Output      06/28 0701 - 06/29 0700 06/29 0701 - 06/30 0700   I.V. (mL/kg)  2467.1 (27.3)   Blood  400   IV Piggyback  1400   Total Intake(mL/kg)  4267.1 (47.3)   Urine (mL/kg/hr)  1760 (1.7)   Emesis/NG output  30 (0)   Blood  1625 (1.5)   Chest Tube  190 (0.2)   Total Output   3605   Net   +662.1          . sodium chloride 20 mL/hr at 02/25/16 1400  . [START ON 02/26/2016] sodium chloride    . sodium chloride 10 mL/hr at 02/25/16 1400  . dexmedetomidine Stopped (02/25/16 1536)  . DOPamine    . insulin (NOVOLIN-R) infusion 2.2 Units/hr (02/25/16 1800)  . lactated ringers Stopped (02/25/16 1320)  . lactated ringers 20 mL/hr at 02/25/16 1400  . nitroGLYCERIN Stopped (02/25/16 1320)  . norepinephrine (LEVOPHED) Adult infusion 6 mcg/min (02/25/16 1823)     Lab Results  Component Value Date   WBC 11.0* 02/25/2016   HGB 11.2* 02/25/2016   HCT 33.0* 02/25/2016   PLT 104* 02/25/2016   GLUCOSE 103* 02/25/2016   ALT 30 02/23/2016   AST 28 02/23/2016   NA 140 02/25/2016   K 3.8 02/25/2016   CL 102 02/25/2016   CREATININE 0.80 02/25/2016   BUN 16 02/25/2016   CO2 23 02/23/2016   INR 1.35 02/25/2016   HGBA1C 6.2* 02/23/2016   failued first attempt to wean with increased pco2 Not bleeding  Grace Isaac MD  Beeper [phone] Office [phone] 02/25/2016 6:45 PM

## 2016-02-25 NOTE — Anesthesia Preprocedure Evaluation (Addendum)
Anesthesia Evaluation  Patient identified by MRN, date of birth, ID band Patient awake    Reviewed: Allergy & Precautions, NPO status , Patient's Chart, lab work & pertinent test results  Airway Mallampati: II  TM Distance: >3 FB Neck ROM: Full    Dental no notable dental hx. (+) Teeth Intact, Dental Advisory Given   Pulmonary neg pulmonary ROS, former smoker,    Pulmonary exam normal breath sounds clear to auscultation       Cardiovascular hypertension, Pt. on medications + CAD and + Past MI  Normal cardiovascular exam Rhythm:Regular Rate:Normal  Borderline enlarged The left ventricular systolic function is normal. The left ventricular ejection fraction is 55-65% by visual estimate. There are wall motion abnormalities in the left ventricle. mild basal inferior hypokinesis There are segmental wall motion abnormalities in the left ventricle   Neuro/Psych negative neurological ROS  negative psych ROS   GI/Hepatic negative GI ROS, Neg liver ROS,   Endo/Other  negative endocrine ROS  Renal/GU negative Renal ROS  negative genitourinary   Musculoskeletal negative musculoskeletal ROS (+)   Abdominal   Peds negative pediatric ROS (+)  Hematology negative hematology ROS (+)   Anesthesia Other Findings   Reproductive/Obstetrics negative OB ROS                           Anesthesia Physical Anesthesia Plan  ASA: III  Anesthesia Plan: General   Post-op Pain Management:    Induction: Intravenous  Airway Management Planned: Oral ETT  Additional Equipment: Arterial line, PA Cath, CVP, Ultrasound Guidance Line Placement and TEE  Intra-op Plan:   Post-operative Plan: Post-operative intubation/ventilation  Informed Consent: I have reviewed the patients History and Physical, chart, labs and discussed the procedure including the risks, benefits and alternatives for the proposed anesthesia with the  patient or authorized representative who has indicated his/her understanding and acceptance.   Dental advisory given  Plan Discussed with: CRNA and Surgeon  Anesthesia Plan Comments:         Anesthesia Quick Evaluation

## 2016-02-25 NOTE — Anesthesia Postprocedure Evaluation (Signed)
Anesthesia Post Note  Patient: Curtis Tyler  Procedure(s) Performed: Procedure(s) (LRB): CORONARY ARTERY BYPASS GRAFTING (CABG) times four using the left internal mammary artery and the right saphenus vein  (N/A) INTRAOPERATIVE TRANSESOPHAGEAL ECHOCARDIOGRAM (N/A)  Patient location during evaluation: SICU Anesthesia Type: General Level of consciousness: patient remains intubated per anesthesia plan Pain management: pain level controlled Vital Signs Assessment: post-procedure vital signs reviewed and stable Respiratory status: patient remains intubated per anesthesia plan Cardiovascular status: stable Postop Assessment: no signs of nausea or vomiting Anesthetic complications: no    Last Vitals:  Filed Vitals:   02/25/16 1400 02/25/16 1415  BP: 105/69   Pulse: 89 89  Temp: 36 C 36.2 C  Resp: 14 20    Last Pain: There were no vitals filed for this visit.               Mikyah Alamo S

## 2016-02-25 NOTE — Interval H&P Note (Signed)
History and Physical Interval Note:  02/25/2016 8:07 AM  Curtis Tyler  has presented today for surgery, with the diagnosis of CAD  The various methods of treatment have been discussed with the patient and family. After consideration of risks, benefits and other options for treatment, the patient has consented to  Procedure(s): CORONARY ARTERY BYPASS GRAFTING (CABG) (N/A) INTRAOPERATIVE TRANSESOPHAGEAL ECHOCARDIOGRAM (N/A) as a surgical intervention .  The patient's history has been reviewed, patient examined, no change in status, stable for surgery.  I have reviewed the patient's chart and labs.  Questions were answered to the patient's satisfaction.     [name]

## 2016-02-25 NOTE — OR Nursing (Signed)
12:45 - 2nd call to SICU charge nurse

## 2016-02-25 NOTE — OR Nursing (Signed)
12:20 - 45 minute call to SICU charge nurse 

## 2016-02-25 NOTE — Brief Op Note (Signed)
02/25/2016  2:40 PM  PATIENT:  Curtis Tyler  75 y.o. male  PRE-OPERATIVE DIAGNOSIS:  LEFT MAIN/ 3 VESSEL CAD  POST-OPERATIVE DIAGNOSIS:  LEFT MAIN/ 3 VESSEL CAD  PROCEDURE:  Procedure(s): CORONARY ARTERY BYPASS GRAFTING (CABG) times four using the left internal mammary artery and the right saphenus vein  (N/A) INTRAOPERATIVE TRANSESOPHAGEAL ECHOCARDIOGRAM (N/A)   LIMA to LAD  SVG to PDA  Sequential SVG to OM1 and OM2 EVH right leg  SURGEON:  Surgeon(s) and Role:    * [name], MD - Primary  PHYSICIAN ASSISTANT: none  ASSISTANTS: Sherian Maroon, CSA   ANESTHESIA:   general  EBL:  Total I/O In: 3050.8 [I.V.:2000.8; Blood:400; IV Piggyback:650] Out: 2860 [Urine:1165; Emesis/NG output:30; Blood:1625; Chest Tube:40]  BLOOD ADMINISTERED:none  DRAINS: 2 Chest Tube(s) in the mediastinum and left pleura   LOCAL MEDICATIONS USED:  NONE  SPECIMEN:  No Specimen  DISPOSITION OF SPECIMEN:  N/A  COUNTS:  YES  PLAN OF CARE: Admit to inpatient   PATIENT DISPOSITION:  ICU - intubated and hemodynamically stable.   Delay start of Pharmacological VTE agent (>24hrs) due to surgical blood loss or risk of bleeding: yes  XC= 64 min CPB= 96 min Good targets and conduits PL too small to graft

## 2016-02-26 ENCOUNTER — Inpatient Hospital Stay (HOSPITAL_COMMUNITY): Payer: PPO

## 2016-02-26 ENCOUNTER — Encounter (HOSPITAL_COMMUNITY): Payer: Self-pay | Admitting: Thoracic Surgery (Cardiothoracic Vascular Surgery)

## 2016-02-26 LAB — BASIC METABOLIC PANEL
ANION GAP: 6 (ref 5–15)
BUN: 11 mg/dL (ref 6–20)
CHLORIDE: 105 mmol/L (ref 101–111)
CO2: 25 mmol/L (ref 22–32)
Calcium: 7.6 mg/dL — ABNORMAL LOW (ref 8.9–10.3)
Creatinine, Ser: 0.91 mg/dL (ref 0.61–1.24)
GFR calc Af Amer: >60 mL/min (ref >60)
GFR calc non Af Amer: >60 mL/min (ref >60)
GLUCOSE: 102 mg/dL — AB (ref 65–99)
POTASSIUM: 3.8 mmol/L (ref 3.5–5.1)
Sodium: 136 mmol/L (ref 135–145)

## 2016-02-26 LAB — GLUCOSE, CAPILLARY
GLUCOSE-CAPILLARY: 142 mg/dL — AB (ref 65–99)
GLUCOSE-CAPILLARY: 175 mg/dL — AB (ref 65–99)
GLUCOSE-CAPILLARY: 158 mg/dL — AB (ref 65–99)
GLUCOSE-CAPILLARY: 129 mg/dL — AB (ref 65–99)
Glucose-Capillary: 118 mg/dL — ABNORMAL HIGH (ref 65–99)
Glucose-Capillary: 120 mg/dL — ABNORMAL HIGH (ref 65–99)
Glucose-Capillary: 128 mg/dL — ABNORMAL HIGH (ref 65–99)
Glucose-Capillary: 137 mg/dL — ABNORMAL HIGH (ref 65–99)
Glucose-Capillary: 146 mg/dL — ABNORMAL HIGH (ref 65–99)
Glucose-Capillary: 162 mg/dL — ABNORMAL HIGH (ref 65–99)

## 2016-02-26 LAB — CBC
HEMATOCRIT: 29.7 % — AB (ref 39.0–52.0)
HEMATOCRIT: 31.7 % — AB (ref 39.0–52.0)
HEMOGLOBIN: 10.1 g/dL — AB (ref 13.0–17.0)
Hemoglobin: 10.5 g/dL — ABNORMAL LOW (ref 13.0–17.0)
MCH: 31.7 pg (ref 26.0–34.0)
MCH: 30.8 pg (ref 26.0–34.0)
MCHC: 34 g/dL (ref 30.0–36.0)
MCHC: 33.1 g/dL (ref 30.0–36.0)
MCV: 93.1 fL (ref 78.0–100.0)
MCV: 93 fL (ref 78.0–100.0)
PLATELETS: 91 10*3/uL — AB (ref 150–400)
Platelets: 101 10*3/uL — ABNORMAL LOW (ref 150–400)
RBC: 3.19 MIL/uL — AB (ref 4.22–5.81)
RBC: 3.41 MIL/uL — ABNORMAL LOW (ref 4.22–5.81)
RDW: 12.7 % (ref 11.5–15.5)
RDW: 13.1 % (ref 11.5–15.5)
WBC: 10.7 10*3/uL — AB (ref 4.0–10.5)
WBC: 11.8 10*3/uL — ABNORMAL HIGH (ref 4.0–10.5)

## 2016-02-26 LAB — POCT I-STAT, CHEM 8
BUN: 11 mg/dL (ref 6–20)
CALCIUM ION: 1.1 mmol/L — AB (ref 1.12–1.23)
Chloride: 97 mmol/L — ABNORMAL LOW (ref 101–111)
Creatinine, Ser: 1 mg/dL (ref 0.61–1.24)
GLUCOSE: 136 mg/dL — AB (ref 65–99)
HCT: 31 % — ABNORMAL LOW (ref 39.0–52.0)
HEMOGLOBIN: 10.5 g/dL — AB (ref 13.0–17.0)
Potassium: 4.1 mmol/L (ref 3.5–5.1)
SODIUM: 135 mmol/L (ref 135–145)
TCO2: 26 mmol/L (ref 0–100)

## 2016-02-26 LAB — MAGNESIUM
MAGNESIUM: 2.3 mg/dL (ref 1.7–2.4)
Magnesium: 2.1 mg/dL (ref 1.7–2.4)

## 2016-02-26 LAB — CREATININE, SERUM
Creatinine, Ser: 1.04 mg/dL (ref 0.61–1.24)
GFR calc Af Amer: >60 mL/min (ref >60)
GFR calc non Af Amer: >60 mL/min (ref >60)

## 2016-02-26 MED ORDER — POTASSIUM CHLORIDE 10 MEQ/50ML IV SOLN
10.0000 meq | INTRAVENOUS | Status: AC
Start: 2016-02-26 — End: 2016-02-26
  Administered 2016-02-26 (×4): 10 meq via INTRAVENOUS
  Filled 2016-02-26: qty 50

## 2016-02-26 MED ORDER — INSULIN ASPART 100 UNIT/ML ~~LOC~~ SOLN
0.0000 [IU] | SUBCUTANEOUS | Status: DC
  Administered 2016-02-26: 2 [IU] via SUBCUTANEOUS
  Administered 2016-02-26: 4 [IU] via SUBCUTANEOUS
  Administered 2016-02-26 – 2016-02-27 (×3): 2 [IU] via SUBCUTANEOUS

## 2016-02-26 MED ORDER — CLOPIDOGREL BISULFATE 75 MG PO TABS
75.0000 mg | ORAL_TABLET | Freq: Every day | ORAL | Status: DC
  Administered 2016-02-26 – 2016-02-29 (×4): 75 mg via ORAL
  Filled 2016-02-26 (×4): qty 1

## 2016-02-26 MED ORDER — AMLODIPINE BESYLATE 5 MG PO TABS
5.0000 mg | ORAL_TABLET | Freq: Every day | ORAL | Status: DC
  Administered 2016-02-26 – 2016-02-29 (×4): 5 mg via ORAL
  Filled 2016-02-26 (×4): qty 1

## 2016-02-26 MED ORDER — FUROSEMIDE 10 MG/ML IJ SOLN
40.0000 mg | Freq: Once | INTRAMUSCULAR | Status: AC
  Administered 2016-02-26: 40 mg via INTRAVENOUS
  Filled 2016-02-26: qty 4

## 2016-02-26 MED ORDER — INSULIN ASPART 100 UNIT/ML ~~LOC~~ SOLN
3.0000 [IU] | Freq: Three times a day (TID) | SUBCUTANEOUS | Status: DC
  Administered 2016-02-26 (×3): 3 [IU] via SUBCUTANEOUS

## 2016-02-26 MED ORDER — INSULIN DETEMIR 100 UNIT/ML ~~LOC~~ SOLN
25.0000 [IU] | Freq: Two times a day (BID) | SUBCUTANEOUS | Status: DC
  Filled 2016-02-26 (×4): qty 0.25

## 2016-02-26 MED ORDER — ASPIRIN 81 MG PO CHEW
81.0000 mg | CHEWABLE_TABLET | Freq: Every day | ORAL | Status: DC
  Administered 2016-02-26 – 2016-02-29 (×4): 81 mg via ORAL
  Filled 2016-02-26 (×5): qty 1

## 2016-02-26 MED ORDER — INSULIN DETEMIR 100 UNIT/ML ~~LOC~~ SOLN
25.0000 [IU] | Freq: Two times a day (BID) | SUBCUTANEOUS | Status: DC
  Administered 2016-02-26 (×2): 25 [IU] via SUBCUTANEOUS
  Filled 2016-02-26 (×4): qty 0.25

## 2016-02-26 MED FILL — Mannitol IV Soln 20%: INTRAVENOUS | Qty: 500 | Status: AC

## 2016-02-26 MED FILL — Heparin Sodium (Porcine) Inj 1000 Unit/ML: INTRAMUSCULAR | Qty: 10 | Status: AC

## 2016-02-26 MED FILL — Sodium Bicarbonate IV Soln 8.4%: INTRAVENOUS | Qty: 50 | Status: AC

## 2016-02-26 MED FILL — Heparin Sodium (Porcine) Inj 1000 Unit/ML: INTRAMUSCULAR | Qty: 30 | Status: AC

## 2016-02-26 MED FILL — Electrolyte-R (PH 7.4) Solution: INTRAVENOUS | Qty: 4000 | Status: AC

## 2016-02-26 MED FILL — Sodium Chloride IV Soln 0.9%: INTRAVENOUS | Qty: 2000 | Status: AC

## 2016-02-26 MED FILL — Lidocaine HCl IV Inj 20 MG/ML: INTRAVENOUS | Qty: 5 | Status: AC

## 2016-02-26 MED FILL — Magnesium Sulfate Inj 50%: INTRAMUSCULAR | Qty: 10 | Status: AC

## 2016-02-26 MED FILL — Potassium Chloride Inj 2 mEq/ML: INTRAVENOUS | Qty: 40 | Status: AC

## 2016-02-26 NOTE — Progress Notes (Signed)
1 Day Post-Op Procedure(s) (LRB): CORONARY ARTERY BYPASS GRAFTING (CABG) times four using the left internal mammary artery and the right saphenus vein  (N/A) INTRAOPERATIVE TRANSESOPHAGEAL ECHOCARDIOGRAM (N/A) Subjective: Some pain  Objective: Vital signs in last 24 hours: Temp:  [96.4 F (35.8 C)-99.9 F (37.7 C)] 98.6 F (37 C) (06/30 0700) Pulse Rate:  [88-90] 89 (06/30 0700) Cardiac Rhythm:  [-] A-V Sequential paced (06/30 0200) Resp:  [14-32] 21 (06/30 0700) BP: (74-113)/(44-69) 87/50 mmHg (06/30 0700) SpO2:  [91 %-98 %] 93 % (06/30 0700) Arterial Line BP: (88-147)/(44-69) 108/45 mmHg (06/30 0700) FiO2 (%):  [50 %] 50 % (06/29 1323) Weight:  [210 lb 12.2 oz (95.6 kg)] 210 lb 12.2 oz (95.6 kg) (06/30 0500)  Hemodynamic parameters for last 24 hours: PAP: (29-42)/(11-28) 31/16 mmHg CO:  [4 L/min-6.7 L/min] 6.7 L/min CI:  [2.1 L/min/m2-3.5 L/min/m2] 3.5 L/min/m2  Intake/Output from previous day: 06/29 0701 - 06/30 0700 In: 5202.4 [I.V.:3152.4; Blood:400; IV Piggyback:1650] Out: [id number] [Urine:2390; Emesis/NG output:30; Blood:1625; Chest Tube:480] Intake/Output this shift:    General appearance: alert, cooperative and no distress Neurologic: intact Heart: regular rate and rhythm Lungs: diminished breath sounds bibasilar Abdomen: distended, nontender, + BS  Lab Results:  Recent Labs  02/25/16 1910 02/26/16 0429  WBC 14.1* 10.7*  HGB 11.4* 10.1*  HCT 33.6* 29.7*  PLT 107* 101*   BMET:  Recent Labs  02/23/16 1351  02/25/16 1900 02/25/16 1910 02/26/16 0429  NA 133*  < > 140  --  136  K 4.4  < > 4.1  --  3.8  CL 104  < > 103  --  105  CO2 23  --   --   --  25  GLUCOSE 109*  < > 118*  --  102*  BUN 16  < > 12  --  11  CREATININE 1.44*  < > 0.80 0.85 0.91  CALCIUM 8.8*  --   --   --  7.6*  < > = values in this interval not displayed.  PT/INR:  Recent Labs  02/25/16 1325  LABPROT 16.8*  INR 1.35   ABG    Component Value Date/Time   PHART 7.379  02/25/2016 2101   HCO3 23.5 02/25/2016 2101   TCO2 25 02/25/2016 2101   ACIDBASEDEF 1.0 02/25/2016 2101   O2SAT 94.0 02/25/2016 2101   CBG (last 3)   Recent Labs  02/25/16 2156 02/25/16 2258 02/25/16 2358  GLUCAP 175* 158* 128*    Assessment/Plan: S/P Procedure(s) (LRB): CORONARY ARTERY BYPASS GRAFTING (CABG) times four using the left internal mammary artery and the right saphenus vein  (N/A) INTRAOPERATIVE TRANSESOPHAGEAL ECHOCARDIOGRAM (N/A) -  CV- good cardiac function, ECG  1st degree block, no ischemic changes  Dc swan  ASA, plavix  Restart norvasc  RESP- IS for bibasilar atelectasis  RENAL- creatinine normal, lytes OK  Volume overload sec to 3rd spacing- diurese  ENDO- CBG controlled with insulin drip, but have been unable to wean off  Start levemir + SSI  Anemia secondary to ABL- mild, follow  Thrombocytopenia- will avoid heparin for now, SCD for DVT prophylaxis  DC chest tubes  OOB, ambulate   LOS: 1 day    [name] 02/26/2016

## 2016-02-26 NOTE — Op Note (Signed)
NAMEDARRELL, EMRICH               ACCOUNT NO.:  [id number]  MEDICAL RECORD NO.:  [id number]  LOCATION:  2S04C                        FACILITY:  [facility]  PHYSICIAN:  Revonda Standard. 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  PROCEDURE:  Median sternotomy, extracorporeal circulation  Coronary artery bypass grafting x 4   Left internal mammary artery to left anterior descending   Saphenous vein graft to posterior descending   Sequential saphenous vein graft to obtuse marginals of 1 and 2  Endoscopic vein harvest right leg  SURGEON:  Remo Lipps C. Roxan Hockey, M.D.  ASSISTANTMckinley Jewel, CSA.  ANESTHESIA:  General.  FINDINGS:  Posterolateral too small to graft.  Grafted targets were good Quality. Good quality conduits.  CLINICAL NOTE:  Mr. Cluster is a 75 year old gentleman with a longstanding history of coronary disease who recently had a high risk stress test.  At catheterization he was found to have left main and three- vessel disease with preserved left ventricular function.  Coronary artery bypass grafting is indicated for survival benefit.  The indications, risks, benefits, and alternatives were discussed in detail with the patient.  He understood and accepted the risks and agreed to proceed.  OPERATIVE NOTE:  Mr. Smtih was brought to the preoperative holding area on February 25, 2016, Anesthesia placed a Swan-Ganz catheter and an arterial blood pressure monitoring line.  He was taken to the operating room, anesthetized, and intubated.  A Foley catheter was placed. Transesophageal echocardiography was performed.  There was distal inferior wall akinesis and mild anterior wall hypokinesis.  There was no significant valvular  pathology.  The chest, abdomen, and legs were prepped and draped in usual sterile fashion.  A median sternotomy was performed and the left internal mammary artery was harvested using standard technique.  The patient's chest wall was relatively noncompliant which made mammary harvest difficult, but the vessel was good quality.  Simultaneously with the mammary harvest, an incision was made in the medial aspect of the right leg and the greater saphenous vein was harvested from mid calf to groin.  It was a good quality vessel.  2000 units of heparin was administered during the vessel harvest.  The remainder of the full heparin dose was given prior to opening the pericardium.  After harvesting the conduits, a sternal retractor was placed.  This was opened slowly over time due to the relatively noncompliant chest.  There was extensive mediastinal fatty tissue overlying the pericardium.  The pericardium was opened.  The ascending aorta was inspected.  There was significant calcific plaque, mostly along the right side of the aorta, specifically near the takeoff of the innominate and also more proximally.  The left side of the aorta was relatively free of disease, and there were clear sites for cannulation, clamping and proximal placement without disrupting the calcified plaque.  After confirming adequate anticoagulation, the aorta was cannulated via concentric 2-0 Ethibond pledgeted pursestring sutures.  A dual-stage venous cannula was placed via  a pursestring suture in the right atrial appendage. Cardiopulmonary bypass was initiated and flows were maintained per protocol.  The patient was cooled to 32 degrees Celsius.  The coronary arteries were inspected and anastomotic sites were chosen.  The posterolateral branch appeared too small to graft where it could be identified.  The remaining vessels appeared to be good quality targets. The conduits were inspected and cut to length.  A foam pad was  placed in the pericardium to insulate the heart and protect the left phrenic nerve.  A temperature probe was placed in myocardial septum.  A cardioplegia cannula was placed in the ascending aorta away from the plaque.  The aorta was crossclamped.  The left ventricle was emptied via the aortic root vent.  Cardiac arrest was achieved with combination of cold antegrade blood cardioplegia and topical iced saline.  1 L of cardioplegia was administered.  There was septal cooling to 10 degrees Celsius.  There was a rapid diastolic arrest.  A reversed saphenous vein graft was placed end-to-side to the posterior descending branch to the right coronary.  This was a 1.5 mm good quality target.  Much better than it appeared in catheterization.  The vein was anastomosed end-to-side with a running 7-0 Prolene suture.  All anastomoses were probed proximally and distally at their completion. Cardioplegia was administered down the vein graft.  There was good flow and good hemostasis.  A reversed saphenous vein graft then was placed sequentially to obtuse marginals of 1 and 2.  A side-to-side anastomosis was performed to OM1 and an end-to-side to OM 2.  OM 1 was a 1.5 mm vessel.  OM2 was a 2 mm vessel.  Both were good quality at the site of the anastomosis.  There was good flow with cardioplegia administration and good hemostasis at both anastomoses.  After giving additional cardioplegia down the aortic root, the left internal mammary artery was brought through a window in the pericardium. The distal end was beveled.  It was anastomosed end-to-side to the distal LAD.  Both the mammary and LAD were 2 mm good quality vessels. The end-to-side anastomosis was performed with a running 8-0 Prolene suture.  At the completion of the mammary to LAD anastomosis, the bulldog clamp was briefly removed.  Rapid septal rewarming was noted. The bulldog clamp was replaced, and the mammary pedicle was tacked to the  epicardial surface of the heart with 6-0 Prolene sutures.  Additional cardioplegia was administered.  The vein grafts were cut to length.  The cardioplegia cannula was removed from the ascending aorta. The proximal vein graft anastomoses were performed to 4.5 mm punch aortotomies with running 6-0 Prolene sutures.  At the completion of the final proximal anastomosis, the patient was placed in Trendelenburg position.  Lidocaine was administered.  The bulldog clamp was again removed from the left mammary artery.  The aortic root was de-aired and the aortic crossclamp was removed.  The total crossclamp time was 64 minutes.  The patient was initially in heart block, then resumed sinus rhythm, but was bradycardic.  While rewarming was completed, all proximal and distal anastomoses were inspected for hemostasis. Epicardial pacing wires were placed on the right ventricle and right atrium.  DDD pacing was initiated.  When the patient had rewarmed to a core temperature of 37 degrees Celsius, he was weaned from cardiopulmonary bypass on the first attempt.  The total bypass time was 96 minutes.  The initial cardiac index greater than 2 L/min/m2, and the patient remained hemodynamically  stable throughout the post bypass period.  A test dose of protamine was administered and was well tolerated.  The atrial and aortic cannulae were removed.  The remainder of the protamine was administered without incident.  The chest was irrigated with warm saline.  Hemostasis was achieved.  The pericardium was not closed.  The left pleural and mediastinal chest tubes were placed through separate subcostal incisions and secured with #1 silk sutures.  The sternum was closed with a combination of single and double heavy gauge stainless steel wires.  The pectoralis fascia, subcutaneous tissue, and skin were closed in standard fashion.  All sponge, needle, and instrument counts were correct at the end of the procedure.   The patient was taken from the operating room to the Surgical Intensive Care Unit intubated and in good condition.     Revonda Standard Roxan Hockey, M.D.     SCH/MEDQ  D:  02/25/2016  T:  02/26/2016  Job:  [id number]

## 2016-02-26 NOTE — Progress Notes (Signed)
      [address]       [place] [address]             [phone]      Ambulated 300' twice today  BP 115/51 mmHg  Pulse 76  Temp(Src) 98.5 F (36.9 C) (Oral)  Resp 28  Ht 5' 2.5" (1.588 m)  Wt 210 lb 12.2 oz (95.6 kg)  BMI 37.91 kg/m2  SpO2 93%   Intake/Output Summary (Last 24 hours) at 02/26/16 1904 Last data filed at 02/26/16 1800  Gross per 24 hour  Intake 1432.24 ml  Output   2120 ml  Net -687.76 ml    K= 4.1, creatiinine 1.0  Doing well POD # 1  Steven C. Roxan Hockey, MD [facility] [phone]

## 2016-02-26 NOTE — Progress Notes (Signed)
Inpatient Diabetes Program Recommendations  AACE/ADA: New Consensus Statement on Inpatient Glycemic Control (2015)  Target Ranges:  Prepandial:   less than 140 mg/dL      Peak postprandial:   less than 180 mg/dL (1-2 hours)      Critically ill patients:  140 - 180 mg/dL   Lab Results  Component Value Date   GLUCAP 118* 02/26/2016   HGBA1C 6.2* 02/23/2016    Review of Glycemic Control:  Results for JUPITER, REICHOW (MRN [id number]) as of 02/26/2016 11:16  Ref. Range 02/25/2016 20:58 02/25/2016 21:56 02/25/2016 22:58 02/25/2016 23:58 02/26/2016 09:05  Glucose-Capillary Latest Ref Range: 65-99 mg/dL 142 (H) 175 (H) 158 (H) 128 (H) 118 (H)    Diabetes history: None noted-A1C=6.2% Current orders for Inpatient glycemic control:  Patient transitioning off insulin drip to SQ insulin. Levemir 25 units bid, Novolog 3 units tid with meals, TCTS q 4 hours  Inpatient Diabetes Program Recommendations:    Based on A1C, patient will not need insulin after hospitalization.  Consider titration off Levemir as patient condition improves and patient moves to the floor.  Will follow.  Thanks, Adah Perl, RN, BC-ADM Inpatient Diabetes Coordinator Pager [phone] (8a-5p)

## 2016-02-27 ENCOUNTER — Inpatient Hospital Stay (HOSPITAL_COMMUNITY): Payer: PPO

## 2016-02-27 LAB — GLUCOSE, CAPILLARY
GLUCOSE-CAPILLARY: 114 mg/dL — AB (ref 65–99)
GLUCOSE-CAPILLARY: 109 mg/dL — AB (ref 65–99)
GLUCOSE-CAPILLARY: 121 mg/dL — AB (ref 65–99)
GLUCOSE-CAPILLARY: 109 mg/dL — AB (ref 65–99)
GLUCOSE-CAPILLARY: 120 mg/dL — AB (ref 65–99)
GLUCOSE-CAPILLARY: 122 mg/dL — AB (ref 65–99)
GLUCOSE-CAPILLARY: 124 mg/dL — AB (ref 65–99)
GLUCOSE-CAPILLARY: 150 mg/dL — AB (ref 65–99)
GLUCOSE-CAPILLARY: 174 mg/dL — AB (ref 65–99)
GLUCOSE-CAPILLARY: 128 mg/dL — AB (ref 65–99)
Glucose-Capillary: 104 mg/dL — ABNORMAL HIGH (ref 65–99)
Glucose-Capillary: 108 mg/dL — ABNORMAL HIGH (ref 65–99)
Glucose-Capillary: 109 mg/dL — ABNORMAL HIGH (ref 65–99)
Glucose-Capillary: 120 mg/dL — ABNORMAL HIGH (ref 65–99)
Glucose-Capillary: 145 mg/dL — ABNORMAL HIGH (ref 65–99)
Glucose-Capillary: 153 mg/dL — ABNORMAL HIGH (ref 65–99)
Glucose-Capillary: 129 mg/dL — ABNORMAL HIGH (ref 65–99)
Glucose-Capillary: 159 mg/dL — ABNORMAL HIGH (ref 65–99)

## 2016-02-27 LAB — BASIC METABOLIC PANEL
Anion gap: 7 (ref 5–15)
BUN: 11 mg/dL (ref 6–20)
CHLORIDE: 100 mmol/L — AB (ref 101–111)
CO2: 26 mmol/L (ref 22–32)
CREATININE: 0.95 mg/dL (ref 0.61–1.24)
Calcium: 8 mg/dL — ABNORMAL LOW (ref 8.9–10.3)
GFR calc non Af Amer: >60 mL/min (ref >60)
GLUCOSE: 126 mg/dL — AB (ref 65–99)
Potassium: 4.1 mmol/L (ref 3.5–5.1)
Sodium: 133 mmol/L — ABNORMAL LOW (ref 135–145)

## 2016-02-27 LAB — CBC
HCT: 31.8 % — ABNORMAL LOW (ref 39.0–52.0)
Hemoglobin: 10.6 g/dL — ABNORMAL LOW (ref 13.0–17.0)
MCH: 31 pg (ref 26.0–34.0)
MCHC: 33.3 g/dL (ref 30.0–36.0)
MCV: 93 fL (ref 78.0–100.0)
PLATELETS: 94 10*3/uL — AB (ref 150–400)
RBC: 3.42 MIL/uL — AB (ref 4.22–5.81)
RDW: 13.1 % (ref 11.5–15.5)
WBC: 12.5 10*3/uL — ABNORMAL HIGH (ref 4.0–10.5)

## 2016-02-27 MED ORDER — MAGNESIUM HYDROXIDE 400 MG/5ML PO SUSP: 30.0000 mL | Freq: Every day | ORAL | Status: DC | PRN

## 2016-02-27 MED ORDER — SODIUM CHLORIDE 0.9 % IV SOLN: 250.0000 mL | INTRAVENOUS | Status: DC | PRN

## 2016-02-27 MED ORDER — ALUM & MAG HYDROXIDE-SIMETH 200-200-20 MG/5ML PO SUSP: 15.0000 mL | ORAL | Status: DC | PRN

## 2016-02-27 MED ORDER — ZOLPIDEM TARTRATE 5 MG PO TABS
5.0000 mg | ORAL_TABLET | Freq: Every evening | ORAL | Status: DC | PRN
  Administered 2016-02-28: 5 mg via ORAL
  Filled 2016-02-27: qty 1

## 2016-02-27 MED ORDER — INSULIN ASPART 100 UNIT/ML ~~LOC~~ SOLN
0.0000 [IU] | Freq: Three times a day (TID) | SUBCUTANEOUS | Status: DC
Start: 2016-02-27 — End: 2016-02-29
  Administered 2016-02-27 (×2): 3 [IU] via SUBCUTANEOUS
  Administered 2016-02-27: 2 [IU] via SUBCUTANEOUS
  Administered 2016-02-28: 3 [IU] via SUBCUTANEOUS
  Administered 2016-02-28 (×2): 2 [IU] via SUBCUTANEOUS

## 2016-02-27 MED ORDER — MOVING RIGHT ALONG BOOK
Freq: Once | Status: AC
  Administered 2016-02-27: 17:00:00
  Filled 2016-02-27: qty 1

## 2016-02-27 MED ORDER — FUROSEMIDE 40 MG PO TABS
40.0000 mg | ORAL_TABLET | Freq: Every day | ORAL | Status: DC
  Administered 2016-02-27 – 2016-02-29 (×3): 40 mg via ORAL
  Filled 2016-02-27 (×3): qty 1

## 2016-02-27 MED ORDER — SODIUM CHLORIDE 0.9% FLUSH
3.0000 mL | Freq: Two times a day (BID) | INTRAVENOUS | Status: DC
  Administered 2016-02-27 – 2016-02-29 (×3): 3 mL via INTRAVENOUS

## 2016-02-27 MED ORDER — INSULIN DETEMIR 100 UNIT/ML ~~LOC~~ SOLN
25.0000 [IU] | Freq: Every day | SUBCUTANEOUS | Status: DC
  Administered 2016-02-27 – 2016-02-28 (×2): 25 [IU] via SUBCUTANEOUS
  Filled 2016-02-27 (×2): qty 0.25

## 2016-02-27 MED ORDER — POTASSIUM CHLORIDE CRYS ER 20 MEQ PO TBCR
20.0000 meq | EXTENDED_RELEASE_TABLET | Freq: Every day | ORAL | Status: DC
  Administered 2016-02-27 – 2016-02-29 (×3): 20 meq via ORAL
  Filled 2016-02-27 (×3): qty 1

## 2016-02-27 MED ORDER — SODIUM CHLORIDE 0.9% FLUSH: 3.0000 mL | INTRAVENOUS | Status: DC | PRN

## 2016-02-27 NOTE — Progress Notes (Signed)
Patient has arrived on unit from 2south. Patient oriented to unit, assessed, placed on tele, VS were stable.

## 2016-02-27 NOTE — Progress Notes (Signed)
2 Days Post-Op Procedure(s) (LRB): CORONARY ARTERY BYPASS GRAFTING (CABG) times four using the left internal mammary artery and the right saphenus vein  (N/A) INTRAOPERATIVE TRANSESOPHAGEAL ECHOCARDIOGRAM (N/A) Subjective: Already walked 1 1/2 laps around unit today  Objective: Vital signs in last 24 hours: Temp:  [98.2 F (36.8 C)-98.6 F (37 C)] 98.4 F (36.9 C) (07/01 0330) Pulse Rate:  [73-89] 87 (07/01 0800) Cardiac Rhythm:  [-] Normal sinus rhythm (07/01 0600) Resp:  [10-31] 12 (07/01 0800) BP: (83-126)/(44-70) 114/53 mmHg (07/01 0800) SpO2:  [91 %-95 %] 93 % (07/01 0800) Weight:  [211 lb 9 oz (95.964 kg)] 211 lb 9 oz (95.964 kg) (07/01 0500)  Hemodynamic parameters for last 24 hours:    Intake/Output from previous day: 06/30 0701 - 07/01 0700 In: 718.4 [I.V.:418.4; IV Piggyback:300] Out: 1800 [Urine:1700; Chest Tube:100] Intake/Output this shift:    General appearance: alert, cooperative and no distress Neurologic: intact Heart: regular rate and rhythm Lungs: diminished breath sounds bibasilar Abdomen: normal findings: soft, non-tender  Lab Results:  Recent Labs  02/26/16 1500 02/26/16 1614 02/27/16 0350  WBC 11.8*  --  12.5*  HGB 10.5* 10.5* 10.6*  HCT 31.7* 31.0* 31.8*  PLT 91*  --  94*   BMET:  Recent Labs  02/26/16 0429  02/26/16 1614 02/27/16 0350  NA 136  --  135 133*  K 3.8  --  4.1 4.1  CL 105  --  97* 100*  CO2 25  --   --  26  GLUCOSE 102*  --  136* 126*  BUN 11  --  11 11  CREATININE 0.91  < > 1.00 0.95  CALCIUM 7.6*  --   --  8.0*  < > = values in this interval not displayed.  PT/INR:  Recent Labs  02/25/16 1325  LABPROT 16.8*  INR 1.35   ABG    Component Value Date/Time   PHART 7.379 02/25/2016 2101   HCO3 23.5 02/25/2016 2101   TCO2 26 02/26/2016 1614   ACIDBASEDEF 1.0 02/25/2016 2101   O2SAT 94.0 02/25/2016 2101   CBG (last 3)   Recent Labs  02/26/16 1937 02/26/16 2340 02/27/16 0331  GLUCAP 146* 162* 129*     Assessment/Plan: S/P Procedure(s) (LRB): CORONARY ARTERY BYPASS GRAFTING (CABG) times four using the left internal mammary artery and the right saphenus vein  (N/A) INTRAOPERATIVE TRANSESOPHAGEAL ECHOCARDIOGRAM (N/A) Doing well transfer to 2 west CV- in SR  BP controlled with amlodipine and metoprolol, will restart losartan prior to dc if BP allows  ASA, plavix  RESP- some LLL atelectasis- continue IS  RENAL- creatinine stable- PO lasix and potassium  ENDO- CBG well controlled- change to AC/ HS  Thrombocytopenia- stable, no signs of bleeding. Not on enoxaparin, but ambulating well and has SCD  LOS: 2 days    [name] 02/27/2016

## 2016-02-27 NOTE — Progress Notes (Signed)
Report given to Katrina on 2W. Patient transferred by RN.

## 2016-02-28 ENCOUNTER — Inpatient Hospital Stay (HOSPITAL_COMMUNITY): Payer: PPO

## 2016-02-28 LAB — CBC
HEMATOCRIT: 30.9 % — AB (ref 39.0–52.0)
Hemoglobin: 10.3 g/dL — ABNORMAL LOW (ref 13.0–17.0)
MCH: 30.8 pg (ref 26.0–34.0)
MCHC: 33.3 g/dL (ref 30.0–36.0)
MCV: 92.5 fL (ref 78.0–100.0)
Platelets: 108 10*3/uL — ABNORMAL LOW (ref 150–400)
RBC: 3.34 MIL/uL — AB (ref 4.22–5.81)
RDW: 12.8 % (ref 11.5–15.5)
WBC: 10.6 10*3/uL — AB (ref 4.0–10.5)

## 2016-02-28 LAB — BASIC METABOLIC PANEL
ANION GAP: 6 (ref 5–15)
BUN: 13 mg/dL (ref 6–20)
CALCIUM: 8.3 mg/dL — AB (ref 8.9–10.3)
CO2: 29 mmol/L (ref 22–32)
Chloride: 100 mmol/L — ABNORMAL LOW (ref 101–111)
Creatinine, Ser: 0.99 mg/dL (ref 0.61–1.24)
Glucose, Bld: 110 mg/dL — ABNORMAL HIGH (ref 65–99)
POTASSIUM: 3.6 mmol/L (ref 3.5–5.1)
Sodium: 135 mmol/L (ref 135–145)

## 2016-02-28 LAB — GLUCOSE, CAPILLARY
GLUCOSE-CAPILLARY: 132 mg/dL — AB (ref 65–99)
GLUCOSE-CAPILLARY: 88 mg/dL (ref 65–99)
Glucose-Capillary: 122 mg/dL — ABNORMAL HIGH (ref 65–99)
Glucose-Capillary: 163 mg/dL — ABNORMAL HIGH (ref 65–99)

## 2016-02-28 MED ORDER — METOPROLOL TARTRATE 25 MG PO TABS
25.0000 mg | ORAL_TABLET | Freq: Two times a day (BID) | ORAL | Status: DC
  Administered 2016-02-28 – 2016-02-29 (×2): 25 mg via ORAL
  Filled 2016-02-28 (×2): qty 1

## 2016-02-28 MED ORDER — METOPROLOL TARTRATE 25 MG/10 ML ORAL SUSPENSION
12.5000 mg | Freq: Two times a day (BID) | ORAL | Status: DC
  Filled 2016-02-28: qty 10

## 2016-02-28 NOTE — Progress Notes (Addendum)
[address]       ,[place] [address]             [phone]      3 Days Post-Op Procedure(s) (LRB): CORONARY ARTERY BYPASS GRAFTING (CABG) times four using the left internal mammary artery and the right saphenus vein  (N/A) INTRAOPERATIVE TRANSESOPHAGEAL ECHOCARDIOGRAM (N/A) Subjective: conts to feel better  Objective: Vital signs in last 24 hours: Temp:  [97.5 F (36.4 C)-99.4 F (37.4 C)] 98.2 F (36.8 C) (07/02 0445) Pulse Rate:  [87-99] 97 (07/02 0445) Cardiac Rhythm:  [-] Normal sinus rhythm (07/02 0700) Resp:  [18-31] 20 (07/02 0445) BP: (101-141)/(44-84) 139/53 mmHg (07/02 0445) SpO2:  [89 %-94 %] 91 % (07/02 0445) Weight:  [205 lb 9.6 oz (93.26 kg)] 205 lb 9.6 oz (93.26 kg) (07/02 0445)  Hemodynamic parameters for last 24 hours:    Intake/Output from previous day: 07/01 0701 - 07/02 0700 In: 120 [I.V.:70; IV Piggyback:50] Out: 1975 [Urine:1975] Intake/Output this shift:    General appearance: alert, cooperative and no distress Heart: regular rate and rhythm Lungs: clear to auscultation bilaterally Abdomen: benign Extremities: + edema Wound: evh incis healing well, chest dressing CDI  Lab Results:  Recent Labs  02/27/16 0350 02/28/16 0250  WBC 12.5* 10.6*  HGB 10.6* 10.3*  HCT 31.8* 30.9*  PLT 94* 108*   BMET:  Recent Labs  02/27/16 0350 02/28/16 0250  NA 133* 135  K 4.1 3.6  CL 100* 100*  CO2 26 29  GLUCOSE 126* 110*  BUN 11 13  CREATININE 0.95 0.99  CALCIUM 8.0* 8.3*    PT/INR:  Recent Labs  02/25/16 1325  LABPROT 16.8*  INR 1.35   ABG    Component Value Date/Time   PHART 7.379 02/25/2016 2101   HCO3 23.5 02/25/2016 2101   TCO2 26 02/26/2016 1614   ACIDBASEDEF 1.0 02/25/2016 2101   O2SAT 94.0 02/25/2016 2101   CBG (last 3)   Recent Labs  02/27/16 1709 02/27/16 2134 02/28/16 0612  GLUCAP 174* 128* 122*    Meds Scheduled Meds: . acetaminophen  1,000 mg Oral Q6H   Or  . acetaminophen  (TYLENOL) oral liquid 160 mg/5 mL  1,000 mg Per Tube Q6H  . amLODipine  5 mg Oral Daily  . aspirin  81 mg Oral Daily  . bisacodyl  10 mg Oral Daily   Or  . bisacodyl  10 mg Rectal Daily  . clopidogrel  75 mg Oral Daily  . docusate sodium  200 mg Oral Daily  . doxazosin  4 mg Oral QHS  . ezetimibe  10 mg Oral Daily  . finasteride  5 mg Oral Daily  . furosemide  40 mg Oral Daily  . insulin aspart  0-15 Units Subcutaneous TID WC  . insulin detemir  25 Units Subcutaneous Daily  . latanoprost  1 drop Both Eyes Daily  . metoprolol tartrate  12.5 mg Oral BID   Or  . metoprolol tartrate  12.5 mg Per Tube BID  . omega-3 acid ethyl esters  1 g Oral Daily  . pantoprazole  40 mg Oral Daily  . potassium chloride  20 mEq Oral Daily  . rosuvastatin  10 mg Oral QODAY  . sodium chloride flush  3 mL Intravenous Q12H   Continuous Infusions:  PRN Meds:.sodium chloride, alum & mag hydroxide-simeth, magnesium hydroxide, ondansetron (ZOFRAN) IV, oxyCODONE, sodium chloride flush, traMADol, zolpidem  Xrays Dg Chest 2 View  02/28/2016  CLINICAL DATA:  Postop from CABG. Followup pleural effusions and atelectasis. EXAM: CHEST  2 VIEW COMPARISON:  02/27/2016 FINDINGS: Right jugular Cordis is been removed since previous study. No pneumothorax identified. Low lung volumes again noted. Bibasilar atelectasis and small bilateral pleural effusions show no significant change. Mild cardiomegaly remains stable. IMPRESSION: Small bilateral pleural effusions and bibasilar atelectasis, without significant change. Electronically Signed   By: Earle Gell M.D.   On: 02/28/2016 08:10   Dg Chest Port 1 View  02/27/2016  CLINICAL DATA:  Status post coronary bypass grafting EXAM: PORTABLE CHEST 1 VIEW COMPARISON:  02/26/2016 FINDINGS: Swan-Ganz catheter, mediastinal drain and left thoracostomy catheter have been removed in the interval. No pneumothorax is noted. Right jugular sheath remains. Cardiac shadow remains enlarged.  Postsurgical changes are again seen. Bibasilar atelectasis and small left pleural effusion are again noted and stable. IMPRESSION: Interval removal of tubes and lines. Otherwise stable appearance of the chest. Electronically Signed   By: Inez Catalina M.D.   On: 02/27/2016 07:14    Assessment/Plan: S/P Procedure(s) (LRB): CORONARY ARTERY BYPASS GRAFTING (CABG) times four using the left internal mammary artery and the right saphenus vein  (N/A) INTRAOPERATIVE TRANSESOPHAGEAL ECHOCARDIOGRAM (N/A)  1 steady progress 2 leukocytosis resolved, H/H stable, thrombocytopenia improved 3 some volume overload- cont lasix, increase K+ replacement. Creat- normal 4 BP a bit variable , hold on starting ARB for now,increase beta blocker  5 HGBA1C 6.2- stop detemir  insulin and see how he does- was on no home meds 6 Routine rehab/pulm toilet  LOS: 3 days    GOLD,WAYNE E 02/28/2016  Patient seen and examined, agree with above Hopefully home in next day or two  Remo Lipps C. Roxan Hockey, MD [facility] [phone]

## 2016-02-28 NOTE — Progress Notes (Signed)
Pt ambulated 450 feet with the nurse. Pt required no assistance. Pt is now resting in bed peacefully, no complaints. Call bell in reach, will continue to monitor.  Curtis Tyler

## 2016-02-28 NOTE — Progress Notes (Signed)
Pt ambulated 500 ft without difficulty. Denies pain or complaints.

## 2016-02-29 LAB — GLUCOSE, CAPILLARY
Glucose-Capillary: 117 mg/dL — ABNORMAL HIGH (ref 65–99)
Glucose-Capillary: 156 mg/dL — ABNORMAL HIGH (ref 65–99)

## 2016-02-29 MED ORDER — FUROSEMIDE 40 MG PO TABS: 40.0000 mg | ORAL_TABLET | Freq: Every day | ORAL | Status: DC

## 2016-02-29 MED ORDER — METOPROLOL TARTRATE 25 MG PO TABS: 25.0000 mg | ORAL_TABLET | Freq: Two times a day (BID) | ORAL | Status: DC

## 2016-02-29 MED ORDER — OXYCODONE HCL 5 MG PO TABS: 5.0000 mg | ORAL_TABLET | Freq: Four times a day (QID) | ORAL | Status: DC | PRN

## 2016-02-29 MED ORDER — POTASSIUM CHLORIDE CRYS ER 20 MEQ PO TBCR: 20.0000 meq | EXTENDED_RELEASE_TABLET | Freq: Every day | ORAL | Status: DC

## 2016-02-29 MED ORDER — LOSARTAN POTASSIUM 50 MG PO TABS
100.0000 mg | ORAL_TABLET | Freq: Every day | ORAL | Status: DC
  Administered 2016-02-29: 100 mg via ORAL
  Filled 2016-02-29: qty 2

## 2016-02-29 NOTE — Progress Notes (Addendum)
[address]       [place] [address]             [phone]      4 Days Post-Op Procedure(s) (LRB): CORONARY ARTERY BYPASS GRAFTING (CABG) times four using the left internal mammary artery and the right saphenus vein  (N/A) INTRAOPERATIVE TRANSESOPHAGEAL ECHOCARDIOGRAM (N/A) Subjective: Feeling some shoulder discomfort, o/w ok  Objective: Vital signs in last 24 hours: Temp:  [97.7 F (36.5 C)-98.5 F (36.9 C)] 98.5 F (36.9 C) (07/03 0435) Pulse Rate:  [93-97] 94 (07/03 0435) Cardiac Rhythm:  [-] Normal sinus rhythm (07/02 2220) Resp:  [17-19] 19 (07/03 0435) BP: (112-147)/(52-62) 147/62 mmHg (07/03 0435) SpO2:  [91 %-95 %] 94 % (07/03 0435) Weight:  [199 lb 1.6 oz (90.311 kg)] 199 lb 1.6 oz (90.311 kg) (07/03 0435)  Hemodynamic parameters for last 24 hours:    Intake/Output from previous day: 07/02 0701 - 07/03 0700 In: 480 [P.O.:480] Out: 1100 [Urine:1100] Intake/Output this shift:    General appearance: alert, cooperative and no distress Heart: regular rate and rhythm Lungs: clear to auscultation bilaterally Abdomen: benign Extremities: minor edema Wound: incis healing well  Lab Results:  Recent Labs  02/27/16 0350 02/28/16 0250  WBC 12.5* 10.6*  HGB 10.6* 10.3*  HCT 31.8* 30.9*  PLT 94* 108*   BMET:  Recent Labs  02/27/16 0350 02/28/16 0250  NA 133* 135  K 4.1 3.6  CL 100* 100*  CO2 26 29  GLUCOSE 126* 110*  BUN 11 13  CREATININE 0.95 0.99  CALCIUM 8.0* 8.3*    PT/INR: No results for input(s): LABPROT, INR in the last 72 hours. ABG    Component Value Date/Time   PHART 7.379 02/25/2016 2101   HCO3 23.5 02/25/2016 2101   TCO2 26 02/26/2016 1614   ACIDBASEDEF 1.0 02/25/2016 2101   O2SAT 94.0 02/25/2016 2101   CBG (last 3)   Recent Labs  02/28/16 1620 02/28/16 2117 02/29/16 0620  GLUCAP 163* 88 117*    Meds Scheduled Meds: . acetaminophen  1,000 mg Oral Q6H   Or  . acetaminophen (TYLENOL) oral liquid  160 mg/5 mL  1,000 mg Per Tube Q6H  . amLODipine  5 mg Oral Daily  . aspirin  81 mg Oral Daily  . bisacodyl  10 mg Oral Daily   Or  . bisacodyl  10 mg Rectal Daily  . clopidogrel  75 mg Oral Daily  . docusate sodium  200 mg Oral Daily  . doxazosin  4 mg Oral QHS  . ezetimibe  10 mg Oral Daily  . finasteride  5 mg Oral Daily  . furosemide  40 mg Oral Daily  . insulin aspart  0-15 Units Subcutaneous TID WC  . latanoprost  1 drop Both Eyes Daily  . metoprolol tartrate  25 mg Oral BID   Or  . metoprolol tartrate  12.5 mg Per Tube BID  . omega-3 acid ethyl esters  1 g Oral Daily  . pantoprazole  40 mg Oral Daily  . potassium chloride  20 mEq Oral Daily  . rosuvastatin  10 mg Oral QODAY  . sodium chloride flush  3 mL Intravenous Q12H   Continuous Infusions:  PRN Meds:.sodium chloride, alum & mag hydroxide-simeth, magnesium hydroxide, ondansetron (ZOFRAN) IV, oxyCODONE, sodium chloride flush, traMADol, zolpidem  Xrays Dg Chest 2 View  02/28/2016  CLINICAL DATA:  Postop from CABG. Followup pleural effusions and atelectasis. EXAM: CHEST  2 VIEW COMPARISON:  02/27/2016 FINDINGS: Right jugular Cordis is been removed since previous study. No pneumothorax identified. Low lung volumes again noted. Bibasilar atelectasis and small bilateral pleural effusions show no significant change. Mild cardiomegaly remains stable. IMPRESSION: Small bilateral pleural effusions and bibasilar atelectasis, without significant change. Electronically Signed   By: Earle Gell M.D.   On: 02/28/2016 08:10    Assessment/Plan: S/P Procedure(s) (LRB): CORONARY ARTERY BYPASS GRAFTING (CABG) times four using the left internal mammary artery and the right saphenus vein  (N/A) INTRAOPERATIVE TRANSESOPHAGEAL ECHOCARDIOGRAM (N/A)  1 doing well 2 BP up, I think will tol ARB at this point 3 sugars ok 4 d/c epw's- prob home later this afternoon  LOS: 4 days    Curtis Tyler,Curtis Tyler 02/29/2016  Patient seen and examined, agree with  above- home today  Remo Lipps C. Roxan Hockey, MD [facility] [phone]

## 2016-02-29 NOTE — Discharge Summary (Signed)
Physician Discharge Summary  Patient ID: Curtis Tyler MRN: [id number] DOB/AGE: Dec 31, 1940 75 y.o.  Admit date: 02/25/2016 Discharge date: 02/29/2016  Admission Diagnoses:CAD  Discharge Diagnoses:  Active Problems:   CAD (coronary artery disease)  Patient Active Problem List   Diagnosis Date Noted  . CAD (coronary artery disease) 02/25/2016  . Abnormal stress ECG with treadmill 02/12/2016  . Chest pain 09/05/2012  . Cough 08/06/2011  . Coronary artery disease involving native coronary artery without angina pectoris 04/26/2011  . Essential hypertension 04/26/2011  . Hyperlipidemia with target LDL less than 70 04/26/2011  . Cerebrovascular disease 04/26/2011    Chief Complaint  Patient presents with  . Coronary Artery Disease    Surgical eval, Cardiac Cath 02/12/16    HPI: Curtis Tyler is sent for consultation regarding left main and three-vessel coronary disease.  He is a 75 year old gentleman with multiple cardiac risk factors including hyperlipidemia, hypertension, and remote tobacco abuse. He also has a family history of cardiac disease. He has a known history of cardiac disease dating back to 50 when he was incidentally found to have had an MI. He is not sure what happened. He had a catheterization at that time, and was treated medically.  He has had several catheterizations over the years and most recently in 2014 he had a 60% left main stenosis he also had 60% stenoses in the LAD and circumflex. He was asymptomatic and had an ejection fraction of 65%. He opted for medical management.  He recently saw Dr. Percival Spanish for an annual visit. He was not having any chest pain or shortness of breath. A stress test showed ST depression and was a high risk study. He then underwent cardiac catheterization where he was found to have a 60% ostial left main, a 90% proximal LAD, 90% proximal circumflex, and a diffusely diseased right coronary with a chronically totally occluded posterior  descending.        The patient was admitted for CABG by Dr. Roxan Hockey.   Discharged Condition: good  Hospital Course: The patient was admitted for CABG. He was taken the operating room on 02/25/2016 underwent the below described procedure. He tolerated well was taken to the surgical intensive care unit in stable condition.  Postoperative hospital course: Overall the patient has progressed quite nicely. He has remained hemodynamically stable and neurologically intact. All routine lines, monitors and drainage devices have been discontinued in the standard fashion. He is maintained sinus rhythm. He had a postoperative expected acute Tyler loss anemia which is stable. He had a postoperative thrombocytopenia which is improving. He has some volume overload and is responding to diuretics. His postoperative atelectasis and is being managed by routine measures with incentive spirometry. He has been resumed on his aspirin and Plavix. His Tyler pressure is under good control and home medications have been resumed. Oxygen has been weaned and he maintains good saturations on room air. Incisions are noted be healing well without evidence of infection. He is tolerating diet. He is tolerating routine cardiac rehabilitation phase 1 modalities. His overall status is felt to be quite stable for discharge on today's date.  Consults: None  Significant Diagnostic Studies: routine post op labs/CXR's  Treatments: surgery:  DATE OF PROCEDURE: 02/25/2016 DATE OF DISCHARGE:   OPERATIVE REPORT   PREOPERATIVE DIAGNOSIS: Left main and 3-vessel coronary artery disease.  POSTOPERATIVE DIAGNOSIS: Left main and 3-vessel coronary artery disease.  PROCEDURE: Median sternotomy, extracorporeal circulation, coronary artery bypass grafting x4 (left internal mammary artery to left anterior descending, saphenous  vein graft to posterior descending, sequential saphenous vein graft to  obtuse marginals of 1 and 2).  SURGEON: Revonda Standard. Roxan Hockey, M.D.  ASSISTANTMckinley Jewel, CSA.  ANESTHESIA: General.  FINDINGS: Posterolateral too small to graft. Grafted targets good quality, good quality conduits. Discharge Exam: Tyler pressure 147/62, pulse 94, temperature 98.5 F (36.9 C), temperature source Oral, resp. rate 19, height 5' 2.5" (1.588 m), weight 199 lb 1.6 oz (90.311 kg), SpO2 94 %.   General appearance: alert, cooperative and no distress Heart: regular rate and rhythm Lungs: clear to auscultation bilaterally Abdomen: benign Extremities: minor edema Wound: incis healing well   Disposition: 01-Home or Self Care     Medication List    TAKE these medications        amLODipine 5 MG tablet  Commonly known as:  NORVASC  Take 5 mg by mouth daily.     aspirin 81 MG tablet  Take 81 mg by mouth daily.     CHOLEST OFF 450 MG Tabs  Generic drug:  Plant Sterols and Stanols  Take 900 mg by mouth daily.     clopidogrel 75 MG tablet  Commonly known as:  PLAVIX  Take 75 mg by mouth daily.     Co Q 10 100 MG Caps  Take 100 mg by mouth daily.     CRESTOR 10 MG tablet  Generic drug:  rosuvastatin  Take 10 mg by mouth every other day.     doxazosin 4 MG tablet  Commonly known as:  CARDURA  Take 4 mg by mouth at bedtime.     finasteride 5 MG tablet  Commonly known as:  PROSCAR  Take 5 mg by mouth daily.     fish oil-omega-3 fatty acids 1000 MG capsule  Take 1 g by mouth daily.     furosemide 40 MG tablet  Commonly known as:  LASIX  Take 1 tablet (40 mg total) by mouth daily.     GLUCOSAMINE-CHONDROITIN PO  Take 2 tablets by mouth daily.     latanoprost 0.005 % ophthalmic solution  Commonly known as:  XALATAN  Place 1 drop into both eyes daily.     losartan 100 MG tablet  Commonly known as:  COZAAR  Take 100 mg by mouth daily.     metoprolol tartrate 25 MG tablet  Commonly known as:  LOPRESSOR  Take 1 tablet (25 mg total)  by mouth 2 (two) times daily.     oxyCODONE 5 MG immediate release tablet  Commonly known as:  Oxy IR/ROXICODONE  Take 1-2 tablets (5-10 mg total) by mouth every 6 (six) hours as needed for severe pain.     potassium chloride SA 20 MEQ tablet  Commonly known as:  K-DUR,KLOR-CON  Take 1 tablet (20 mEq total) by mouth daily.     traMADol 50 MG tablet  Commonly known as:  ULTRAM  Take 1 tablet (50 mg total) by mouth every 6 (six) hours as needed.     ZETIA 10 MG tablet  Generic drug:  ezetimibe  Take 10 mg by mouth daily.       Follow-up Information    Follow up with [name], MD.   Specialty:  Cardiothoracic Surgery   Why:  office will contact with appt, please obtain a chest x-ray at [facility] one half hour prior to appointment with the surgeon. [facility] is located in the same office complex.   Contact information:   [address]  [place] [address] [phone]       Follow up with Glenetta Hew, MD.   Specialty:  Cardiology   Why:  A 2 week appointment will be arranged for cardiology follow-up.   Contact information:   [address] [place] [address] [phone]      The patient has been discharged on:   1.Beta Blocker:  Yes [  y ]                              No   [   ]                              If No, reason:  2.Ace Inhibitor/ARB: Yes [  y ]                                     No  [    ]                                     If No, reason:  3.Statin:   Yes Blue.Reese   ]                  No  [   ]                  If No, reason:  4.Ecasa:  Yes  [  y ]                  No   [   ]                  If No, reason:  Signed: GOLD,WAYNE E 02/29/2016, 9:10 AM

## 2016-02-29 NOTE — Progress Notes (Signed)
Removed wires right side with some resistance left side had no resistance. Wires intact when removed intact. Pt. Tolerated procedure well. BP 164/66.

## 2016-02-29 NOTE — Care Management Important Message (Signed)
Important Message  Patient Details  Name: Curtis Tyler MRN: [id number] Date of Birth: [birth year]   Medicare Important Message Given:  Yes    Nathen May 02/29/2016, 10:47 AM

## 2016-02-29 NOTE — Progress Notes (Signed)
Pt ambulated 500 ft this morning. Pt denies any distress and none noted.

## 2016-02-29 NOTE — Discharge Instructions (Signed)
Endoscopic Saphenous Vein Harvesting, Care After °Refer to this sheet in the next few weeks. These instructions provide you with information on caring for yourself after your procedure. Your health care provider may also give you more specific instructions. Your treatment has been planned according to current medical practices, but problems sometimes occur. Call your health care provider if you have any problems or questions after your procedure. °HOME CARE INSTRUCTIONS °Medicine °· Take whatever pain medicine your surgeon prescribes. Follow the directions carefully. Do not take over-the-counter pain medicine unless your surgeon says it is okay. Some pain medicine can cause bleeding problems for several weeks after surgery. °· Follow your surgeon's instructions about driving. You will probably not be permitted to drive after heart surgery. °· Take any medicines your surgeon prescribes. Any medicines you took before your heart surgery should be checked with your health care provider before you start taking them again. °Wound care °· If your surgeon has prescribed an elastic bandage or stocking, ask how long you should wear it. °· Check the area around your surgical cuts (incisions) whenever your bandages (dressings) are changed. Look for any redness or swelling. °· You will need to return to have the stitches (sutures) or staples taken out. Ask your surgeon when to do that. °· Ask your surgeon when you can shower or bathe. °Activity °· Try to keep your legs raised when you are sitting. °· Do any exercises your health care providers have given you. These may include deep breathing exercises, coughing, walking, or other exercises. °SEEK MEDICAL CARE IF: °· You have any questions about your medicines. °· You have more leg pain, especially if your pain medicine stops working. °· New or growing bruises develop on your leg. °· Your leg swells, feels tight, or becomes red. °· You have numbness in your leg. °SEEK IMMEDIATE  MEDICAL CARE IF: °· Your pain gets much worse. °· Blood or fluid leaks from any of the incisions. °· Your incisions become warm, swollen, or red. °· You have chest pain. °· You have trouble breathing. °· You have a fever. °· You have more pain near your leg incision. °MAKE SURE YOU: °· Understand these instructions. °· Will watch your condition. °· Will get help right away if you are not doing well or get worse. °  °This information is not intended to replace advice given to you by your health care provider. Make sure you discuss any questions you have with your health care provider. °  °Document Released: 04/27/2011 Document Revised: 09/05/2014 Document Reviewed: 04/27/2011 °Elsevier Interactive Patient Education ©2016 [employer] °Coronary Artery Bypass Grafting, Care After °These instructions give you information on caring for yourself after your procedure. Your doctor may also give you more specific instructions. Call your doctor if you have any problems or questions after your procedure.  °HOME CARE °· Only take medicine as told by your doctor. Take medicines exactly as told. Do not stop taking medicines or start any new medicines without talking to your doctor first. °· Take your pulse as told by your doctor. °· Do deep breathing as told by your doctor. Use your breathing device (incentive spirometer), if given, to practice deep breathing several times a day. Support your chest with a pillow or your arms when you take deep breaths or cough. °· Keep the area clean, dry, and protected where the surgery cuts (incisions) were made. Remove bandages (dressings) only as told by your doctor. If strips were applied to surgical area, do not take   them off. They fall off on their own. °· Check the surgery area daily for puffiness (swelling), redness, or leaking fluid. °· If surgery cuts were made in your legs: °· Avoid crossing your legs. °· Avoid sitting for long periods of time. Change positions every 30  minutes. °· Raise your legs when you are sitting. Place them on pillows. °· Wear stockings that help keep blood clots from forming in your legs (compression stockings). °· Only take sponge baths until your doctor says it is okay to take showers. Pat the surgery area dry. Do not rub the surgery area with a washcloth or towel. Do not bathe, swim, or use a hot tub until your doctor says it is okay. °· Eat foods that are high in fiber. These include raw fruits and vegetables, whole grains, beans, and nuts. Choose lean meats. Avoid canned, processed, and fried foods. °· Drink enough fluids to keep your pee (urine) clear or pale yellow. °· Weigh yourself every day. °· Rest and limit activity as told by your doctor. You may be told to: °· Stop any activity if you have chest pain, shortness of breath, changes in heartbeat, or dizziness. Get help right away if this happens. °· Move around often for short amounts of time or take short walks as told by your doctor. Gradually become more active. You may need help to strengthen your muscles and build endurance. °· Avoid lifting, pushing, or pulling anything heavier than 10 pounds (4.5 kg) for at least 6 weeks after surgery. °· Do not drive until your doctor says it is okay. °· Ask your doctor when you can go back to work. °· Ask your doctor when you can begin sexual activity again. °· Follow up with your doctor as told. °GET HELP IF: °· You have puffiness, redness, more pain, or fluid draining from the incision site. °· You have a fever. °· You have puffiness in your ankles or legs. °· You have pain in your legs. °· You gain 2 or more pounds (0.9 kg) a day. °· You feel sick to your stomach (nauseous) or throw up (vomit). °· You have watery poop (diarrhea). °GET HELP RIGHT AWAY IF: °· You have chest pain that goes to your jaw or arms. °· You have shortness of breath. °· You have a fast or irregular heartbeat. °· You notice a "clicking" in your breastbone when you move. °· You  have numbness or weakness in your arms or legs. °· You feel dizzy or light-headed. °MAKE SURE YOU: °· Understand these instructions. °· Will watch your condition. °· Will get help right away if you are not doing well or get worse. °  °This information is not intended to replace advice given to you by your health care provider. Make sure you discuss any questions you have with your health care provider. °  °Document Released: 08/20/2013 Document Reviewed: 08/20/2013 °Elsevier Interactive Patient Education ©2016 Elsevier Inc. ° °

## 2016-02-29 NOTE — Progress Notes (Signed)
CARDIAC REHAB PHASE I   Pt up ad lib in room, has ambulated independently four times this morning, no complaints. Cardiac surgery discharge education completed with pt and wife via phone. Reviewed risk factors, IS, sternal precautions, activity progression, exercise, heart healthy diet, sodium restrictions, daily weights and phase 2 cardiac rehab. Pt and wife verbalized understanding. Pt agrees to phase 2 cardiac rehab referral, will send to [facility] per pt request. Pt in chair, call bell within reach.  [id number] Lenna Sciara, RN, BSN 02/29/2016 9:43 AM

## 2016-02-29 NOTE — Progress Notes (Signed)
Discussed with the patient and wife and all questioned fully answered. He will call me if any problems arise.  Telemetry removed, CCMD notified. IVs removed. Given paper prescriptions and discharge paperwork.   Fritz Pickerel, RN

## 2016-03-07 ENCOUNTER — Ambulatory Visit (INDEPENDENT_AMBULATORY_CARE_PROVIDER_SITE_OTHER): Payer: Self-pay | Admitting: Surgical

## 2016-03-07 VITALS — BP 110/64 | HR 80 | Resp 16 | Ht 62.5 in | Wt 192.5 lb

## 2016-03-07 DIAGNOSIS — Z951 Presence of aortocoronary bypass graft: Secondary | ICD-10-CM

## 2016-03-07 DIAGNOSIS — I2510 Atherosclerotic heart disease of native coronary artery without angina pectoris: Secondary | ICD-10-CM

## 2016-03-07 NOTE — Progress Notes (Signed)
[address]       [place] [address]             [phone]                  Theophile Pollet [facility] Medical Record [id number] Date of Birth: May 26, 1941  Referring [id number], Leonie Green, MD Primary Cardiology: Primary Nolon Stalls., MD  Chief Complaint:  Follow Up Visit  PHYSICIAN: Revonda Standard. Roxan Hockey, M.D.DATE OF BIRTH: March 17, 1941  DATE OF PROCEDURE: 02/25/2016 DATE OF DISCHARGE:   OPERATIVE REPORT   PREOPERATIVE DIAGNOSIS: Left main and 3-vessel coronary artery disease.  POSTOPERATIVE DIAGNOSIS: Left main and 3-vessel coronary artery disease.  PROCEDURE: Median sternotomy, extracorporeal circulation, coronary artery bypass grafting x4 (left internal mammary artery to left anterior descending, saphenous vein graft to posterior descending, sequential saphenous vein graft to obtuse marginals of 1 and 2).  SURGEON: Revonda Standard. Roxan Hockey, M.D.  ASSISTANTMckinley Jewel, CSA.  ANESTHESIA: General.  FINDINGS: Posterolateral too small to graft. Grafted targets good quality, good quality conduits.  History of Present Illness:      The patient was seen in routine office visit on today's date following above procedure primarily due to concerns over discomfort in the right lower leg. This is the site the endoscopic vein harvest occurred . He describes some discomfort. He has not had any fevers, chills or other constitutional symptoms. There is no associated drainage from the wound. He noticed that the swelling has improved over time.        Zubrod Score: At the time of surgery this patient's most appropriate activity status/level should be described as: []     0    Normal activity, no symptoms []     1    Restricted in physical strenuous activity but ambulatory, able to do out light work []     2    Ambulatory and capable of self care, unable to do work activities, up and about                  >50 % of waking hours                                                                                   []     3    Only limited self care, in bed greater than 50% of waking hours []     4    Completely disabled, no self care, confined to bed or chair []     5    Moribund  History  Smoking status  . Former Smoker -- 1.00 packs/day for 25 years  . Types: Cigarettes  . Quit date: 07/23/1981  Smokeless tobacco  . Never Used       No Known Allergies  Current Outpatient Prescriptions  Medication Sig Dispense Refill  . amLODipine (NORVASC) 5 MG tablet Take 5 mg by mouth daily.      Marland Kitchen aspirin 81 MG tablet Take 81 mg by mouth daily.      . clopidogrel (PLAVIX) 75 MG tablet Take 75 mg by mouth daily.      . Coenzyme Q10 (CO Q 10) 100 MG CAPS Take  100 mg by mouth daily.     . CRESTOR 10 MG tablet Take 10 mg by mouth every other day.     . doxazosin (CARDURA) 4 MG tablet Take 4 mg by mouth at bedtime.      Marland Kitchen ezetimibe (ZETIA) 10 MG tablet Take 10 mg by mouth daily.    . finasteride (PROSCAR) 5 MG tablet Take 5 mg by mouth daily.      . fish oil-omega-3 fatty acids 1000 MG capsule Take 1 g by mouth daily.      Marland Kitchen GLUCOSAMINE-CHONDROITIN PO Take 2 tablets by mouth daily.     Marland Kitchen latanoprost (XALATAN) 0.005 % ophthalmic solution Place 1 drop into both eyes daily.    Marland Kitchen losartan (COZAAR) 100 MG tablet Take 100 mg by mouth daily.      . metoprolol tartrate (LOPRESSOR) 25 MG tablet Take 1 tablet (25 mg total) by mouth 2 (two) times daily. 60 tablet 1  . oxyCODONE (OXY IR/ROXICODONE) 5 MG immediate release tablet Take 1-2 tablets (5-10 mg total) by mouth every 6 (six) hours as needed for severe pain. 30 tablet 0  . Plant Sterols and Stanols (CHOLEST OFF) 450 MG TABS Take 900 mg by mouth daily.     . traMADol (ULTRAM) 50 MG tablet Take 1 tablet (50 mg total) by mouth every 6 (six) hours as needed. (Patient taking differently: Take 50 mg by mouth every 6 (six) hours as needed for moderate pain. ) 30  tablet 0   No current facility-administered medications for this visit.       Physical Exam: BP 110/64 mmHg  Pulse 80  Resp 16  Ht 5' 2.5" (1.588 m)  Wt 192 lb 8 oz (87.317 kg)  BMI 34.63 kg/m2  SpO2 97%  General appearance: alert, cooperative and no distress Heart: regular rate and rhythm Lungs: clear to auscultation bilaterally Extremities: Positive right greater than left lower extremity edema Wounds: The right lower extremity shows significant ecchymosis in the region of the thigh as well as some in the posterior calf. There is no areas of erythema or drainage. All incisions appear to be healing well.  Diagnostic Studies & Laboratory data:         Recent Radiology Findings: No results found.    I have independently reviewed the above radiology findings and reviewed findings  with the patient.  Recent Labs: Lab Results  Component Value Date   WBC 10.6* 02/28/2016   HGB 10.3* 02/28/2016   HCT 30.9* 02/28/2016   PLT 108* 02/28/2016   GLUCOSE 110* 02/28/2016   ALT 30 02/23/2016   AST 28 02/23/2016   NA 135 02/28/2016   K 3.6 02/28/2016   CL 100* 02/28/2016   CREATININE 0.99 02/28/2016   BUN 13 02/28/2016   CO2 29 02/28/2016   INR 1.35 02/25/2016   HGBA1C 6.2* 02/23/2016      Assessment / Plan:      The patient has multiple concerns that appear primarily related to unfamiliarity  with expected postoperative recovery. His Pain complaints appear to be well within normal limits this early postoperatively. This does not appear to be an infection currently but instructed in for indications to watch for such as redness, fevers,or drainage. He hasn't appointment scheduled already for next week and we will see him again at that time or prior to that should any new issues arise.      GOLD,WAYNE E 03/07/2016 1:59 PM

## 2016-03-14 ENCOUNTER — Ambulatory Visit (INDEPENDENT_AMBULATORY_CARE_PROVIDER_SITE_OTHER): Payer: PPO | Admitting: Nurse Practitioner

## 2016-03-14 ENCOUNTER — Encounter: Payer: Self-pay | Admitting: Nurse Practitioner

## 2016-03-14 VITALS — BP 130/64 | HR 72 | Ht 62.5 in | Wt 193.2 lb

## 2016-03-14 DIAGNOSIS — Z951 Presence of aortocoronary bypass graft: Secondary | ICD-10-CM

## 2016-03-14 DIAGNOSIS — I2510 Atherosclerotic heart disease of native coronary artery without angina pectoris: Secondary | ICD-10-CM | POA: Diagnosis not present

## 2016-03-14 DIAGNOSIS — I10 Essential (primary) hypertension: Secondary | ICD-10-CM

## 2016-03-14 DIAGNOSIS — E785 Hyperlipidemia, unspecified: Secondary | ICD-10-CM

## 2016-03-14 NOTE — Patient Instructions (Addendum)
We will be checking the following labs today - BMET and CBC   Medication Instructions:    Continue with your current medicines.     Testing/Procedures To Be Arranged:  N/A  Follow-Up:   See me in 4 weeks with fasting labs    Other Special Instructions:   I have sent a note to cardiac rehab.  Ok to start adding a minute a day to your exercise time - goal is 45 minutes a day total    If you need a refill on your cardiac medications before your next appointment, please call your pharmacy.   Call the [place] office at [phone] if you have any questions, problems or concerns.

## 2016-03-14 NOTE — Progress Notes (Signed)
CARDIOLOGY OFFICE NOTE  Date:  03/14/2016    Curtis Tyler Date of Birth: 04-03-1941 Medical Record [id number]  PCP:  Robyne Peers., MD  Cardiologist:  [facility]   Chief Complaint  Patient presents with  . Coronary Artery Disease    Post op visit - seen for Dr. Percival Spanish    History of Present Illness: Curtis Tyler is a 75 y.o. male who presents today for a post hospital visit. Seen for Dr. Percival Spanish.   He has a history of multiple cardiac risk factors including hyperlipidemia, hypertension, and remote tobacco abuse. He also has a family history of cardiac disease. He has a known history of cardiac disease dating back to 2 when he was incidentally found to have had an MI. He is not sure what happened. He had a catheterization at that time, and was treated medically.  He has had several catheterizations over the years and most recently in 2014 he had a 60% left main stenosis he also had 60% stenoses in the LAD and circumflex. He was asymptomatic and had an ejection fraction of 65%. He opted for medical management. He does have moderate carotid disease per doppler study back in June of 2017.   He recently saw Dr. Percival Spanish for an annual visit. He was not having any chest pain or shortness of breath. A stress test showed ST depression and was a high risk study. He then underwent cardiac catheterization where he was found to have a 60% ostial left main, a 90% proximal LAD, 90% proximal circumflex, and a diffusely diseased right coronary with a chronically totally occluded posterior descending.  He was taken the operating room on 02/25/2016 for CABG x 4 by Dr. Roxan Hockey.   Post op course was stable. He had a postoperative expected acute blood loss anemia which is stable. He had a postoperative thrombocytopenia which improved. He had some volume overload and was given diuretics. Plavix and aspirin were resumed.   Comes in today. Here with his wife - I used to see her as well. Curtis Tyler is  doing well. No real chest soreness. Breathing is good. No cough. Using his IS up to 1500. Has had some swelling and tightness in the right leg - this has improved - off his diuretics now. Not dizzy or lightheaded. Walking 15 minutes twice a day. Interested in cardiac rehab. He is tolerating his medicines. No fever or chills. Appetite ok. Has lost a few pounds. Bowels ok. He is happy with how he is doing.   Past Medical History  Diagnosis Date  . Hyperlipidemia   . Hypertension   . Coronary artery disease     left main 60% stenosis. The LAD had a proximal 60-70% stenosis. There was mid 40% stenosis. Diagonal is moderate size with 40% stenosis. The circumflex system included a large OM with 60% ostial stenosis the RCA was occluded before the PDA. The EF was 65%.  2014  . Obesity   . Carotid artery occlusion   . 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 Radiculopathy   . Bunion   . Callus   . Arthritis   . Shortness of breath dyspnea     with exertion -   . BPH (benign prostatic hyperplasia)     Past Surgical History  Procedure Laterality Date  . Hernia repair  1960    right hernia repair  . Appendectomy  1958  . Cardiac catheterization  05/2004    SHOWING 100% OCCLUDED DISTAL  RIGHT CORONARY WITH  LEFT  TO RIGHT COLLATERALS, AS WELL AS ANTEGRADE FLOW, NORMAL LEFT MAIN, 50% NARROWING IN LEFT CIRCUMFLEX  WITH IRREGULARITIES IN THE LAD  . Coronary angioplasty      RIGHT CORONARY WERE UNSUCCESSFUL  . US echocardiography  01/03/2007    EF 55-60%  . Cardiovascular stress test  02/26/2010    EF 65%, SMALL AREA OF MILD INFARCT AND POSSIBLE PER-INFARCT ISCHEMIA IN THE INFEROBASAL WALL  . Cardiac catheterization N/A 02/12/2016    Procedure: Left Heart Cath and Coronary Angiography;  Surgeon: Leonie Man, MD;  Location: [place] [facility];  Service: Cardiovascular;  Laterality: N/A;  . Umbilical hernia repair    . Colonoscopy    . Coronary artery bypass graft N/A 02/25/2016     Procedure: CORONARY ARTERY BYPASS GRAFTING (CABG) times four using the left internal mammary artery and the right saphenus vein ;  Surgeon: [name], MD;  Location: [place];  Service: Open Heart Surgery;  Laterality: N/A;  . Intraoperative transesophageal echocardiogram N/A 02/25/2016    Procedure: INTRAOPERATIVE TRANSESOPHAGEAL ECHOCARDIOGRAM;  Surgeon: [name], MD;  Location: [place];  Service: Open Heart Surgery;  Laterality: N/A;     Medications: Current Outpatient Prescriptions  Medication Sig Dispense Refill  . amLODipine (NORVASC) 5 MG tablet Take 5 mg by mouth daily.      Marland Kitchen aspirin 81 MG tablet Take 81 mg by mouth daily.      . clopidogrel (PLAVIX) 75 MG tablet Take 75 mg by mouth daily.      . Coenzyme Q10 (CO Q 10) 100 MG CAPS Take 100 mg by mouth daily.     . CRESTOR 10 MG tablet Take 10 mg by mouth every other day.     . doxazosin (CARDURA) 4 MG tablet Take 4 mg by mouth at bedtime.      Marland Kitchen ezetimibe (ZETIA) 10 MG tablet Take 10 mg by mouth daily.    . finasteride (PROSCAR) 5 MG tablet Take 5 mg by mouth daily.      . fish oil-omega-3 fatty acids 1000 MG capsule Take 1 g by mouth daily.      Marland Kitchen GLUCOSAMINE-CHONDROITIN PO Take 2 tablets by mouth daily.     Marland Kitchen latanoprost (XALATAN) 0.005 % ophthalmic solution Place 1 drop into both eyes daily.    Marland Kitchen losartan (COZAAR) 100 MG tablet Take 100 mg by mouth daily.      . metoprolol tartrate (LOPRESSOR) 25 MG tablet Take 1 tablet (25 mg total) by mouth 2 (two) times daily. 60 tablet 1  . oxyCODONE (OXY IR/ROXICODONE) 5 MG immediate release tablet Take 1-2 tablets (5-10 mg total) by mouth every 6 (six) hours as needed for severe pain. 30 tablet 0  . Plant Sterols and Stanols (CHOLEST OFF) 450 MG TABS Take 900 mg by mouth daily.      No current facility-administered medications for this visit.    Allergies: No Known Allergies  Social History: The patient  reports that he quit smoking about 34 years ago. His smoking  use included Cigarettes. He has a 25 pack-year smoking history. He has never used smokeless tobacco. He reports that he drinks about 2.4 oz of alcohol per week. He reports that he does not use illicit drugs.   Family History: The patient's family history includes Congestive Heart Failure in his mother; Diabetes in his mother; Stroke in his father. There is no history of Hypertension.   Review of Systems: Please see  the history of present illness.   Otherwise, the review of systems is positive for none.   All other systems are reviewed and negative.   Physical Exam: VS:  BP 130/64 mmHg  Pulse 72  Ht 5' 2.5" (1.588 m)  Wt 193 lb 3.2 oz (87.635 kg)  BMI 34.75 kg/m2 .  BMI Body mass index is 34.75 kg/(m^2).  Wt Readings from Last 3 Encounters:  03/14/16 193 lb 3.2 oz (87.635 kg)  03/07/16 192 lb 8 oz (87.317 kg)  02/29/16 199 lb 1.6 oz (90.311 kg)    General: Pleasant. Well developed, well nourished and in no acute distress. He remains overweight.  HEENT: Normal. Neck: Supple, no JVD, carotid bruits, or masses noted.  Cardiac: Regular rate and rhythm. No murmurs, rubs, or gallops. His sternum looks good. Trace edema in the right foot. Vein harvesting of the right leg.  Respiratory:  Lungs are clear to auscultation bilaterally with normal work of breathing.  GI: Soft and nontender.  MS: No deformity or atrophy. Gait and ROM intact. Skin: Warm and dry. Color is normal.  Neuro:  Strength and sensation are intact and no gross focal deficits noted.  Psych: Alert, appropriate and with normal affect.   LABORATORY DATA:  EKG:  EKG is ordered today. This demonstrates NSR with 1st degree AV block.  Lab Results  Component Value Date   WBC 10.6* 02/28/2016   HGB 10.3* 02/28/2016   HCT 30.9* 02/28/2016   PLT 108* 02/28/2016   GLUCOSE 110* 02/28/2016   ALT 30 02/23/2016   AST 28 02/23/2016   NA 135 02/28/2016   K 3.6 02/28/2016   CL 100* 02/28/2016   CREATININE 0.99 02/28/2016   BUN  13 02/28/2016   CO2 29 02/28/2016   INR 1.35 02/25/2016   HGBA1C 6.2* 02/23/2016    BNP (last 3 results) No results for input(s): BNP in the last 8760 hours.  ProBNP (last 3 results) No results for input(s): PROBNP in the last 8760 hours.   Other Studies Reviewed Today:   Assessment/Plan: 1. CAD - post op CABG x 4 - doing well clinically. Making good progress. Refer to cardiac rehab. Reviewed lifting restrictions. Lab today. See back in 4 weeks.   2. HTN - BP ok on current regimen  3. HLD - on statin - labs on return.  4. Carotid disease - checked annually  Current medicines are reviewed with the patient today.  The patient does not have concerns regarding medicines other than what has been noted above.  The following changes have been made:  See above.  Labs/ tests ordered today include:    Orders Placed This Encounter  Procedures  . Basic metabolic panel  . CBC  . EKG 12-Lead     Disposition:   FU with me in 4 weeks with fasting labs.   Patient is agreeable to this plan and will call if any problems develop in the interim.   Signed: Burtis Junes, RN, ANP-C 03/14/2016 3:12 PM  [facility] [address] [place], [place]  [address] Phone: [phone] Fax: [phone]

## 2016-03-15 LAB — CBC
HCT: 36 % — ABNORMAL LOW (ref 38.5–50.0)
Hemoglobin: 12.1 g/dL — ABNORMAL LOW (ref 13.2–17.1)
MCH: 30.5 pg (ref 27.0–33.0)
MCHC: 33.6 g/dL (ref 32.0–36.0)
MCV: 90.7 fL (ref 80.0–100.0)
MPV: 8.8 fL (ref 7.5–12.5)
Platelets: 409 10*3/uL — ABNORMAL HIGH (ref 140–400)
RBC: 3.97 MIL/uL — ABNORMAL LOW (ref 4.20–5.80)
RDW: 13.8 % (ref 11.0–15.0)
WBC: 7 10*3/uL (ref 3.8–10.8)

## 2016-03-15 LAB — BASIC METABOLIC PANEL
BUN: 19 mg/dL (ref 7–25)
CO2: 26 mmol/L (ref 20–31)
Calcium: 8.7 mg/dL (ref 8.6–10.3)
Chloride: 102 mmol/L (ref 98–110)
Creat: 0.86 mg/dL (ref 0.70–1.18)
Glucose, Bld: 92 mg/dL (ref 65–99)
Potassium: 4.5 mmol/L (ref 3.5–5.3)
Sodium: 136 mmol/L (ref 135–146)

## 2016-03-22 ENCOUNTER — Encounter (HOSPITAL_COMMUNITY): Payer: Self-pay

## 2016-03-25 ENCOUNTER — Other Ambulatory Visit: Payer: Self-pay | Admitting: Thoracic Surgery (Cardiothoracic Vascular Surgery)

## 2016-03-25 DIAGNOSIS — Z951 Presence of aortocoronary bypass graft: Secondary | ICD-10-CM

## 2016-03-28 ENCOUNTER — Ambulatory Visit
Admission: RE | Admit: 2016-03-28 | Discharge: 2016-03-28 | Disposition: A | Discharge status: Home / Self Care | Payer: PPO | Source: Ambulatory Visit | Attending: Thoracic Surgery (Cardiothoracic Vascular Surgery) | Admitting: Thoracic Surgery (Cardiothoracic Vascular Surgery)

## 2016-03-28 ENCOUNTER — Ambulatory Visit (INDEPENDENT_AMBULATORY_CARE_PROVIDER_SITE_OTHER): Payer: Self-pay | Admitting: Physician Assistant

## 2016-03-28 VITALS — BP 123/70 | HR 80 | Resp 20 | Ht 62.5 in | Wt 193.0 lb

## 2016-03-28 DIAGNOSIS — R0602 Shortness of breath: Secondary | ICD-10-CM | POA: Diagnosis not present

## 2016-03-28 DIAGNOSIS — I2510 Atherosclerotic heart disease of native coronary artery without angina pectoris: Secondary | ICD-10-CM

## 2016-03-28 DIAGNOSIS — Z951 Presence of aortocoronary bypass graft: Secondary | ICD-10-CM

## 2016-03-28 NOTE — Progress Notes (Signed)
HPI:  Patient returns for routine postoperative follow-up having undergone a CABG x 4 on 02/25/2016 by Dr. Roxan Hockey. The patient has already been seen in routine follow up on 03/07/2016. He feels he is doing well, but has several questions today.   Current Outpatient Prescriptions  Medication Sig Dispense Refill  . amLODipine (NORVASC) 5 MG tablet Take 5 mg by mouth daily.      Marland Kitchen aspirin 81 MG tablet Take 81 mg by mouth daily.      . clopidogrel (PLAVIX) 75 MG tablet Take 75 mg by mouth daily.      . Coenzyme Q10 (CO Q 10) 100 MG CAPS Take 100 mg by mouth daily.     . CRESTOR 10 MG tablet Take 10 mg by mouth every other day.     . doxazosin (CARDURA) 4 MG tablet Take 4 mg by mouth at bedtime.      Marland Kitchen ezetimibe (ZETIA) 10 MG tablet Take 10 mg by mouth daily.    . finasteride (PROSCAR) 5 MG tablet Take 5 mg by mouth daily.      . fish oil-omega-3 fatty acids 1000 MG capsule Take 1 g by mouth daily.      Marland Kitchen GLUCOSAMINE-CHONDROITIN PO Take 2 tablets by mouth daily.     Marland Kitchen latanoprost (XALATAN) 0.005 % ophthalmic solution Place 1 drop into both eyes daily.    Marland Kitchen losartan (COZAAR) 100 MG tablet Take 100 mg by mouth daily.      . metoprolol tartrate (LOPRESSOR) 25 MG tablet Take 1 tablet (25 mg total) by mouth 2 (two) times daily. 60 tablet 1  . Plant Sterols and Stanols (CHOLEST OFF) 450 MG TABS Take 900 mg by mouth daily.     Vital Signs: BP 123/70, HR 80, RR, 20, and oxygen saturation 98% on room air   Physical Exam: CV-RRR Pulmonary-Clear to auscultation bilaterally Extremities- Trace right lower extremity edema Wounds-Clean and dry  Diagnostic Test: EXAM: CHEST  2 VIEW  COMPARISON:  PA and lateral chest x-ray of February 28, 2016  FINDINGS: The lungs are well-expanded. The pleural effusions and left basilar atelectatic changes have resolved. Minimal right basilar subsegmental atelectasis persists. There is no pneumothorax. The heart is top-normal in size. The pulmonary  vascularity is normal. There is calcification in the wall of the aortic arch. The sternal wires are intact. There is mild multilevel degenerative disc disease of the thoracic spine.  IMPRESSION: Considerable improvement in the appearance of the chest since the previous study with resolution of the pleural effusions and left lower lobe atelectasis. Minimal subsegmental atelectasis in the right infrahilar region remains.  Aortic atherosclerosis.   Electronically Signed   By: David  Martinique M.D.   On: 03/28/2016 13:59   Impression and Plan: Overall, Curtis Tyler is doing very well s/p coronary artery bypass surgery. He has already seen cardiology and has a follow up appointment for 2 weeks. He is inquiring if he can drive. He is not taking any narcotics for pain so I instructed him that he may begin drive 30 minutes or less during the day and increase his frequency and duration as tolerates. He was also instructed to continue with sternal precautions (i.e. No lifting more than 10 pounds for 3-4 more weeks). He was instructed he may get in his swimming pool and do leg exercises, but no swimming yet. He asked for a 90 day prescription for Metoprolol Tartrate, which I gave him. He wishes to return to see Dr. Roxan Hockey at the  end of the month.    Curtis Skillern, PA-C [facility] [phone]

## 2016-03-28 NOTE — Patient Instructions (Signed)
Coronary Artery Bypass Grafting, Care After °Refer to this sheet in the next few weeks. These instructions provide you with information on caring for yourself after your procedure. Your health care provider may also give you more specific instructions. Your treatment has been planned according to current medical practices, but problems sometimes occur. Call your health care provider if you have any problems or questions after your procedure. °WHAT TO EXPECT AFTER THE PROCEDURE °Recovery from surgery will be different for everyone. Some people feel well after 3 or 4 weeks, while for others it takes longer. After your procedure, it is typical to have the following: °· Nausea and a lack of appetite.   °· Constipation. °· Weakness and fatigue.   °· Depression or irritability.   °· Pain or discomfort at your incision site. °HOME CARE INSTRUCTIONS °· Take medicines only as directed by your health care provider. Do not stop taking medicines or start any new medicines without first checking with your health care provider. °· Take your pulse as directed by your health care provider. °· Perform deep breathing as directed by your health care provider. If you were given a device called an incentive spirometer, use it to practice deep breathing several times a day. Support your chest with a pillow or your arms when you take deep breaths or cough. °· Keep incision areas clean, dry, and protected. Remove or change any bandages (dressings) only as directed by your health care provider. You may have skin adhesive strips over the incision areas. Do not take the strips off. They will fall off on their own. °· Check incision areas daily for any swelling, redness, or drainage. °· If incisions were made in your legs, do the following: °¨ Avoid crossing your legs.   °¨ Avoid sitting for long periods of time. Change positions every 30 minutes.   °¨ Elevate your legs when you are sitting. °· Wear compression stockings as directed by your  health care provider. These stockings help keep blood clots from forming in your legs. °· Take showers once your health care provider approves. Until then, only take sponge baths. Pat incisions dry. Do not rub incisions with a washcloth or towel. Do not take baths, swim, or use a hot tub until your health care provider approves. °· Eat foods that are high in fiber, such as raw fruits and vegetables, whole grains, beans, and nuts. Meats should be lean cut. Avoid canned, processed, and fried foods. °· Drink enough fluid to keep your urine clear or pale yellow. °· Weigh yourself every day. This helps identify if you are retaining fluid that may make your heart and lungs work harder. °· Rest and limit activity as directed by your health care provider. You may be instructed to: °¨ Stop any activity at once if you have chest pain, shortness of breath, irregular heartbeats, or dizziness. Get help right away if you have any of these symptoms. °¨ Move around frequently for short periods or take short walks as directed by your health care provider. Increase your activities gradually. You may need physical therapy or cardiac rehabilitation to help strengthen your muscles and build your endurance. °¨ Avoid lifting, pushing, or pulling anything heavier than 10 lb (4.5 kg) for at least 6 weeks after surgery. °· Do not drive until your health care provider approves.  °· Ask your health care provider when you may return to work. °· Ask your health care provider when you may resume sexual activity. °· Keep all follow-up visits as directed by your health care   provider. This is important. °SEEK MEDICAL CARE IF: °· You have swelling, redness, increasing pain, or drainage at the site of an incision. °· You have a fever. °· You have swelling in your ankles or legs. °· You have pain in your legs.   °· You gain 2 or more pounds (0.9 kg) a day. °· You are nauseous or vomit. °· You have diarrhea.  °SEEK IMMEDIATE MEDICAL CARE IF: °· You have  chest pain that goes to your jaw or arms. °· You have shortness of breath.   °· You have a fast or irregular heartbeat.   °· You notice a "clicking" in your breastbone (sternum) when you move.   °· You have numbness or weakness in your arms or legs. °· You feel dizzy or light-headed.   °MAKE SURE YOU: °· Understand these instructions. °· Will watch your condition. °· Will get help right away if you are not doing well or get worse. °  °This information is not intended to replace advice given to you by your health care provider. Make sure you discuss any questions you have with your health care provider. °  °Document Released: 03/04/2005 Document Revised: 09/05/2014 Document Reviewed: 01/22/2013 °Elsevier Interactive Patient Education ©2016 [employer] ° °

## 2016-04-05 ENCOUNTER — Encounter (HOSPITAL_COMMUNITY)
Admission: RE | Admit: 2016-04-05 | Discharge: 2016-04-05 | Disposition: A | Discharge status: Home / Self Care | Payer: PPO | Source: Ambulatory Visit | Attending: Cardiology | Admitting: Cardiology

## 2016-04-05 VITALS — BP 124/70 | HR 66 | Ht 62.5 in | Wt 194.9 lb

## 2016-04-05 DIAGNOSIS — Z951 Presence of aortocoronary bypass graft: Secondary | ICD-10-CM | POA: Insufficient documentation

## 2016-04-05 NOTE — Progress Notes (Signed)
Cardiac Rehab Medication Review by a Pharmacist  Does the patient  feel that his/her medications are working for him/her?  yes  Has the patient been experiencing any side effects to the medications prescribed?  yes  Does the patient measure his/her own blood pressure or blood glucose at home?  yes   Does the patient have any problems obtaining medications due to transportation or finances?   no  Understanding of regimen: good Understanding of indications: good Potential of compliance: good    Pharmacist comments: Curtis Tyler is a 22 YOM presenting today for cardiac rehab orientation. He is accompanied by his wife, but he was able to answer all questions on his own without assistance. He was able to recite how he took all his medications quickly and clearly. He does have some muscle pain with his statin, but it has gotten better since crestor has been changed to every other day. He also had questions regarding the use of ibuprofen for inflammation and pain. I recommended he not use ibuprofen or other NSAIDs. For pain, tylenol is a better option. He also asked about tramadol as he has been given that in the past and I counseled that he could talk to his doctor to see if that is a reasonable option.  [name]), PharmD  PGY1 Pharmacy Resident Pager: [phone] 04/05/2016 2:23 PM

## 2016-04-07 ENCOUNTER — Encounter (HOSPITAL_COMMUNITY): Payer: Self-pay

## 2016-04-07 NOTE — Progress Notes (Signed)
Cardiac Individual Treatment Plan  Patient Details  Name: Curtis Tyler MRN: [id number] Date of Birth: 1941/03/22 Referring Provider:   Flowsheet Row CARDIAC REHAB PHASE II ORIENTATION from 04/05/2016 in [facility]  Referring Provider  Minus Breeding MD      Initial Encounter Date:  [place] PHASE II ORIENTATION from 04/05/2016 in [facility]  Date  04/05/16  Referring Provider  Minus Breeding MD      Visit Diagnosis: S/P CABG x 4  Patient's Home Medications on Admission:  Current Outpatient Prescriptions:  .  amLODipine (NORVASC) 5 MG tablet, Take 5 mg by mouth daily.  , Disp: , Rfl:  .  aspirin 81 MG tablet, Take 81 mg by mouth daily.  , Disp: , Rfl:  .  clopidogrel (PLAVIX) 75 MG tablet, Take 75 mg by mouth daily.  , Disp: , Rfl:  .  Coenzyme Q10 (CO Q 10) 100 MG CAPS, Take 100 mg by mouth daily. , Disp: , Rfl:  .  CRESTOR 10 MG tablet, Take 10 mg by mouth every other day. , Disp: , Rfl:  .  doxazosin (CARDURA) 4 MG tablet, Take 4 mg by mouth at bedtime.  , Disp: , Rfl:  .  ezetimibe (ZETIA) 10 MG tablet, Take 10 mg by mouth daily., Disp: , Rfl:  .  finasteride (PROSCAR) 5 MG tablet, Take 5 mg by mouth daily.  , Disp: , Rfl:  .  fish oil-omega-3 fatty acids 1000 MG capsule, Take 1 g by mouth daily.  , Disp: , Rfl:  .  GLUCOSAMINE-CHONDROITIN PO, Take 2 tablets by mouth daily. , Disp: , Rfl:  .  losartan (COZAAR) 100 MG tablet, Take 100 mg by mouth daily.  , Disp: , Rfl:  .  metoprolol tartrate (LOPRESSOR) 25 MG tablet, Take 1 tablet (25 mg total) by mouth 2 (two) times daily., Disp: 60 tablet, Rfl: 1 .  Multiple Vitamins-Minerals (MULTIVITAMIN WITH MINERALS) tablet, Take 1 tablet by mouth daily., Disp: , Rfl:  .  Plant Sterols and Stanols (CHOLEST OFF) 450 MG TABS, Take 900 mg by mouth daily. , Disp: , Rfl:  .  latanoprost (XALATAN) 0.005 % ophthalmic solution, Place 1 drop into both eyes daily.,  Disp: , Rfl:   Past Medical History: Past Medical History:  Diagnosis Date  . Arthritis   . BPH (benign prostatic hyperplasia)   . Bunion   . Callus   . Carotid artery occlusion   . Coronary artery disease    left main 60% stenosis. The LAD had a proximal 60-70% stenosis. There was mid 40% stenosis. Diagonal is moderate size with 40% stenosis. The circumflex system included a large OM with 60% ostial stenosis the RCA was occluded before the PDA. The EF was 65%.  2014  . Hyperlipidemia   . Hypertension   . 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 Obesity   . Radiculopathy   . Shortness of breath dyspnea    with exertion -     Tobacco Use: History  Smoking Status  . Former Smoker  . Packs/day: 1.00  . Years: 25.00  . Types: Cigarettes  . Quit date: 07/23/1981  Smokeless Tobacco  . Never Used    Labs: Recent Review Flowsheet Data    Labs for ITP Cardiac and Pulmonary Rehab Latest Ref Rng & Units 02/25/2016 02/25/2016 02/25/2016 02/25/2016 02/26/2016   Hemoglobin A1c 4.8 - 5.6 % - - - - -  PHART 7.350 - 7.450 7.298(L) - 7.388 7.379 -   PCO2ART 35.0 - 45.0 mmHg 49.5(H) - 39.5 40.0 -   HCO3 20.0 - 24.0 mEq/L 24.2(H) - 23.7 23.5 -   TCO2 0 - 100 mmol/L 26 25 25 25 26    ACIDBASEDEF 0.0 - 2.0 mmol/L 2.0 - 1.0 1.0 -   O2SAT % 91.0 - 94.0 94.0 -      Capillary Blood Glucose: Lab Results  Component Value Date   GLUCAP 156 (H) 02/29/2016   GLUCAP 117 (H) 02/29/2016   GLUCAP 88 02/28/2016   GLUCAP 163 (H) 02/28/2016   GLUCAP 132 (H) 02/28/2016     Exercise Target Goals: Date: 04/05/16  Exercise Program Goal: Individual exercise prescription set with THRR, safety & activity barriers. Participant demonstrates ability to understand and report RPE using BORG scale, to self-measure pulse accurately, and to acknowledge the importance of the exercise prescription.  Exercise Prescription Goal: Starting with aerobic activity 30 plus minutes a day, 3 days per week  for initial exercise prescription. Provide home exercise prescription and guidelines that participant acknowledges understanding prior to discharge.  Activity Barriers & Risk Stratification:     Activity Barriers & Cardiac Risk Stratification - 04/05/16 1455      Activity Barriers & Cardiac Risk Stratification   Activity Barriers Arthritis;Incisional Pain   Cardiac Risk Stratification High      6 Minute Walk:     6 Minute Walk    Row Name 04/05/16 1644         6 Minute Walk   Phase Initial     Distance 1661 feet     Walk Time 6 minutes     # of Rest Breaks 0     MPH 3.15     METS 2.53     RPE 12     VO2 Peak 8.85     Symptoms No     Resting HR 66 bpm     Resting BP 124/70     Max Ex. HR 91 bpm     Max Ex. BP 122/70     2 Minute Post BP 118/60        Initial Exercise Prescription:     Initial Exercise Prescription - 04/05/16 1600      Date of Initial Exercise RX and Referring Provider   Date 04/05/16   Referring Provider Minus Breeding MD     Bike   Level 0.7   Minutes 10   METs 2.51     NuStep   Level 3   Minutes 10   METs 2.5     Track   Laps 11   Minutes 10   METs 2.5     Prescription Details   Frequency (times per week) 3   Duration Progress to 30 minutes of continuous aerobic without signs/symptoms of physical distress     Intensity   THRR 40-80% of Max Heartrate 57-114   Ratings of Perceived Exertion 11-13   Perceived Dyspnea 0-4     Progression   Progression Continue to progress workloads to maintain intensity without signs/symptoms of physical distress.     Resistance Training   Training Prescription Yes   Weight 2 lbs   Reps 10-12      Perform Capillary Blood Glucose checks as needed.  Exercise Prescription Changes:   Exercise Comments:   Discharge Exercise Prescription (Final Exercise Prescription Changes):   Nutrition:  Target Goals: Understanding of nutrition guidelines, daily intake of sodium 1500mg ,  cholesterol <  200mg , calories 30% from fat and 7% or less from saturated fats, daily to have 5 or more servings of fruits and vegetables.  Biometrics:     Pre Biometrics - 04/05/16 1635      Pre Biometrics   Height 5' 2.5" (1.588 m)   Weight 194 lb 14.2 oz (88.4 kg)   Waist Circumference 42 inches   Hip Circumference 41.75 inches   Waist to Hip Ratio 1.01 %   BMI (Calculated) 35.2   Triceps Skinfold 15 mm   % Body Fat 32.4 %   Grip Strength 41 kg   Flexibility 9.5 in   Single Leg Stand 2.75 seconds       Nutrition Therapy Plan and Nutrition Goals:   Nutrition Discharge: Nutrition Scores:   Nutrition Goals Re-Evaluation:   Psychosocial: Target Goals: Acknowledge presence or absence of depression, maximize coping skills, provide positive support system. Participant is able to verbalize types and ability to use techniques and skills needed for reducing stress and depression.  Initial Review & Psychosocial Screening:     Initial Psych Review & Screening - 04/05/16 [place]? Yes     Barriers   Psychosocial barriers to participate in program The patient should benefit from training in stress management and relaxation.;There are no identifiable barriers or psychosocial needs.     Screening Interventions   Interventions Encouraged to exercise      Quality of Life Scores:     Quality of Life - 04/05/16 1603      Quality of Life Scores   Health/Function Pre 27.69 %   Socioeconomic Pre 30 %   Psych/Spiritual Pre 29.14 %   Family Pre 30 %   GLOBAL Pre 28.76 %      PHQ-9: Recent Review Flowsheet Data    There is no flowsheet data to display.      Psychosocial Evaluation and Intervention:   Psychosocial Re-Evaluation:   Vocational Rehabilitation: Provide vocational rehab assistance to qualifying candidates.   Vocational Rehab Evaluation & Intervention:     Vocational Rehab - 04/05/16 1603      Initial  Vocational Rehab Evaluation & Intervention   Assessment shows need for Vocational Rehabilitation No      Education: Education Goals: Education classes will be provided on a weekly basis, covering required topics. Participant will state understanding/return demonstration of topics presented.  Learning Barriers/Preferences:     Learning Barriers/Preferences - 04/07/16 1008      Learning Barriers/Preferences   Learning Preferences Pictoral      Education Topics: Count Your Pulse:  -Group instruction provided by verbal instruction, demonstration, patient participation and written materials to support subject.  Instructors address importance of being able to find your pulse and how to count your pulse when at home without a heart monitor.  Patients get hands on experience counting their pulse with staff help and individually.   Heart Attack, Angina, and Risk Factor Modification:  -Group instruction provided by verbal instruction, video, and written materials to support subject.  Instructors address signs and symptoms of angina and heart attacks.    Also discuss risk factors for heart disease and how to make changes to improve heart health risk factors.   Functional Fitness:  -Group instruction provided by verbal instruction, demonstration, patient participation, and written materials to support subject.  Instructors address safety measures for doing things around the house.  Discuss how to get up and down off the floor, how  to pick things up properly, how to safely get out of a chair without assistance, and balance training.   Meditation and Mindfulness:  -Group instruction provided by verbal instruction, patient participation, and written materials to support subject.  Instructor addresses importance of mindfulness and meditation practice to help reduce stress and improve awareness.  Instructor also leads participants through a meditation exercise.    Stretching for Flexibility and  Mobility:  -Group instruction provided by verbal instruction, patient participation, and written materials to support subject.  Instructors lead participants through series of stretches that are designed to increase flexibility thus improving mobility.  These stretches are additional exercise for major muscle groups that are typically performed during regular warm up and cool down.   Hands Only CPR Anytime:  -Group instruction provided by verbal instruction, video, patient participation and written materials to support subject.  Instructors co-teach with AHA video for hands only CPR.  Participants get hands on experience with mannequins.   Nutrition I class: Heart Healthy Eating:  -Group instruction provided by PowerPoint slides, verbal discussion, and written materials to support subject matter. The instructor gives an explanation and review of the Therapeutic Lifestyle Changes diet recommendations, which includes a discussion on lipid goals, dietary fat, sodium, fiber, plant stanol/sterol esters, sugar, and the components of a well-balanced, healthy diet.   Nutrition II class: Lifestyle Skills:  -Group instruction provided by PowerPoint slides, verbal discussion, and written materials to support subject matter. The instructor gives an explanation and review of label reading, grocery shopping for heart health, heart healthy recipe modifications, and ways to make healthier choices when eating out.   Diabetes Question & Answer:  -Group instruction provided by PowerPoint slides, verbal discussion, and written materials to support subject matter. The instructor gives an explanation and review of diabetes co-morbidities, pre- and post-prandial blood glucose goals, pre-exercise blood glucose goals, signs, symptoms, and treatment of hypoglycemia and hyperglycemia, and foot care basics.   Diabetes Blitz:  -Group instruction provided by PowerPoint slides, verbal discussion, and written materials to  support subject matter. The instructor gives an explanation and review of the physiology behind type 1 and type 2 diabetes, diabetes medications and rational behind using different medications, pre- and post-prandial blood glucose recommendations and Hemoglobin A1c goals, diabetes diet, and exercise including blood glucose guidelines for exercising safely.    Portion Distortion:  -Group instruction provided by PowerPoint slides, verbal discussion, written materials, and food models to support subject matter. The instructor gives an explanation of serving size versus portion size, changes in portions sizes over the last 20 years, and what consists of a serving from each food group.   Stress Management:  -Group instruction provided by verbal instruction, video, and written materials to support subject matter.  Instructors review role of stress in heart disease and how to cope with stress positively.     Exercising on Your Own:  -Group instruction provided by verbal instruction, power point, and written materials to support subject.  Instructors discuss benefits of exercise, components of exercise, frequency and intensity of exercise, and end points for exercise.  Also discuss use of nitroglycerin and activating EMS.  Review options of places to exercise outside of rehab.  Review guidelines for sex with heart disease.   Cardiac Drugs I:  -Group instruction provided by verbal instruction and written materials to support subject.  Instructor reviews cardiac drug classes: antiplatelets, anticoagulants, beta blockers, and statins.  Instructor discusses reasons, side effects, and lifestyle considerations for each drug class.  Cardiac Drugs II:  -Group instruction provided by verbal instruction and written materials to support subject.  Instructor reviews cardiac drug classes: angiotensin converting enzyme inhibitors (ACE-I), angiotensin II receptor blockers (ARBs), nitrates, and calcium channel  blockers.  Instructor discusses reasons, side effects, and lifestyle considerations for each drug class.   Anatomy and Physiology of the Circulatory System:  -Group instruction provided by verbal instruction, video, and written materials to support subject.  Reviews functional anatomy of heart, how it relates to various diagnoses, and what role the heart plays in the overall system.   Knowledge Questionnaire Score:     Knowledge Questionnaire Score - 04/05/16 1602      Knowledge Questionnaire Score   Pre Score 19/24      Core Components/Risk Factors/Patient Goals at Admission:     Personal Goals and Risk Factors at Admission - 04/05/16 1639      Core Components/Risk Factors/Patient Goals on Admission    Weight Management Weight Loss;Yes   Intervention Weight Management: Provide education and appropriate resources to help participant work on and attain dietary goals.;Weight Management/Obesity: Establish reasonable short term and long term weight goals.;Weight Management: Develop a combined nutrition and exercise program designed to reach desired caloric intake, while maintaining appropriate intake of nutrient and fiber, sodium and fats, and appropriate energy expenditure required for the weight goal.   Admit Weight 194 lb 14.2 oz (88.4 kg)   Goal Weight: Short Term 188 lb (85.3 kg)   Goal Weight: Long Term 180 lb (81.6 kg)   Expected Outcomes Short Term: Continue to assess and modify interventions until short term weight is achieved;Long Term: Adherence to nutrition and physical activity/exercise program aimed toward attainment of established weight goal;Weight Loss: Understanding of general recommendations for a balanced deficit meal plan, which promotes 1-2 lb weight loss per week and includes a negative energy balance of [phone] kcal/d   Hypertension Yes   Intervention Provide education on lifestyle modifcations including regular physical activity/exercise, weight management,  moderate sodium restriction and increased consumption of fresh fruit, vegetables, and low fat dairy, alcohol moderation, and smoking cessation.;Monitor prescription use compliance.   Expected Outcomes Short Term: Continued assessment and intervention until BP is < 140/59mm HG in hypertensive participants. < 130/4mm HG in hypertensive participants with diabetes, heart failure or chronic kidney disease.;Long Term: Maintenance of blood pressure at goal levels.   Lipids Yes   Intervention Provide education and support for participant on nutrition & aerobic/resistive exercise along with prescribed medications to achieve LDL 70mg , HDL >40mg .   Expected Outcomes Long Term: Cholesterol controlled with medications as prescribed, with individualized exercise RX and with personalized nutrition plan. Value goals: LDL < 70mg , HDL > 40 mg.;Short Term: Participant states understanding of desired cholesterol values and is compliant with medications prescribed. Participant is following exercise prescription and nutrition guidelines.      Core Components/Risk Factors/Patient Goals Review:    Core Components/Risk Factors/Patient Goals at Discharge (Final Review):    ITP Comments:     ITP Comments    Row Name 04/07/16 1013           ITP Comments Dr Fransico Him is the medical director for cardiac rehab          Comments: Patient attended orientation from 1330 to 1530 to review rules and guidelines for program. Completed 6 minute walk test, Intitial ITP, and exercise prescription.  VSS. 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 Occasional PVC's.  Asymptomatic.Barnet Pall, RN,BSN 04/07/2016  10:28 AM

## 2016-04-11 ENCOUNTER — Encounter (HOSPITAL_COMMUNITY): Admission: RE | Admit: 2016-04-11 | Payer: PPO | Source: Ambulatory Visit

## 2016-04-11 ENCOUNTER — Ambulatory Visit (INDEPENDENT_AMBULATORY_CARE_PROVIDER_SITE_OTHER): Payer: PPO | Admitting: Nurse Practitioner

## 2016-04-11 ENCOUNTER — Encounter: Payer: Self-pay | Admitting: Nurse Practitioner

## 2016-04-11 VITALS — BP 108/64 | HR 68 | Ht 62.5 in | Wt 194.8 lb

## 2016-04-11 DIAGNOSIS — E785 Hyperlipidemia, unspecified: Secondary | ICD-10-CM | POA: Diagnosis not present

## 2016-04-11 DIAGNOSIS — I10 Essential (primary) hypertension: Secondary | ICD-10-CM

## 2016-04-11 DIAGNOSIS — Z951 Presence of aortocoronary bypass graft: Secondary | ICD-10-CM | POA: Diagnosis not present

## 2016-04-11 DIAGNOSIS — E7800 Pure hypercholesterolemia, unspecified: Secondary | ICD-10-CM | POA: Diagnosis not present

## 2016-04-11 NOTE — Progress Notes (Signed)
CARDIOLOGY OFFICE NOTE  Date:  04/11/2016    Curtis Tyler Date of Birth: 1941-04-02 Medical Record [id number]  PCP:  Robyne Peers., MD  Cardiologist:  [facility]    Chief Complaint  Patient presents with  . Coronary Artery Disease  . Hyperlipidemia  . Hypertension    1 month check - seen for Dr. Percival Spanish    History of Present Illness: Curtis Tyler is a 75 y.o. male who presents today for a one month check. Seen for Dr. Percival Spanish. Prior patient of Dr. Susa Simmonds.   He has a history of multiple cardiac risk factors including hyperlipidemia, hypertension, and remote tobacco abuse. He also has a family history of cardiac disease. He has a known history of cardiac disease dating back to 70 when he was incidentally found to have had an MI. He is not sure what happened. He had a catheterization at that time, and was treated medically.  He has had several catheterizations over the years and most recently in 2014 he had a 60% left main stenosis he also had 60% stenoses in the LAD and circumflex. He was asymptomatic and had an ejection fraction of 65%. He opted for medical management. He also has moderate carotid disease per doppler study back in June of 2017.   He saw Dr. Percival Spanish for an annual visit back in May. He was not having any chest pain or shortness of breath. A stress test showed ST depression and was a high risk study. He then underwent cardiac catheterization where he was found to have a 60% ostial left main, a 90% proximal LAD, 90% proximal circumflex, and a diffusely diseased right coronary with a chronically totally occluded posterior descending.  He was taken the operating room on 02/25/2016 for CABG x 4 by Dr. Roxan Hockey.   Post op course was stable. He had a postoperative expected acute blood loss anemia which is stable. He had a postoperative thrombocytopenia which improved. He had some volume overload and was given diuretics. Plavix and aspirin were  resumed.   I saw him a month ago - he was doing very well.   Comes in today. Here with his wife - I am now seeing her back as well. He is walking 45 minutes a day. Had fasting labs with his PCP this morning. Limited by his legs. Has pain in his legs - especially at night.  This has been a chronic issue. Starts rehab on Wednesday. Worried about a ?retained suture in his sternum and otherwise he is very happy with how he is doing.    Past Medical History:  Diagnosis Date  . Arthritis   . BPH (benign prostatic hyperplasia)   . Bunion   . Callus   . Carotid artery occlusion   . Coronary artery disease    left main 60% stenosis. The LAD had a proximal 60-70% stenosis. There was mid 40% stenosis. Diagonal is moderate size with 40% stenosis. The circumflex system included a large OM with 60% ostial stenosis the RCA was occluded before the PDA. The EF was 65%.  2014  . Hyperlipidemia   . Hypertension   . 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 Obesity   . Radiculopathy   . Shortness of breath dyspnea    with exertion -     Past Surgical History:  Procedure Laterality Date  . APPENDECTOMY  1958  . CARDIAC CATHETERIZATION  05/2004   SHOWING 100% OCCLUDED DISTAL RIGHT CORONARY WITH  LEFT  TO RIGHT COLLATERALS, AS WELL AS ANTEGRADE FLOW, NORMAL LEFT MAIN, 50% NARROWING IN LEFT CIRCUMFLEX  WITH IRREGULARITIES IN THE LAD  . CARDIAC CATHETERIZATION N/A 02/12/2016   Procedure: Left Heart Cath and Coronary Angiography;  Surgeon: Leonie Man, MD;  Location: [facility];  Service: Cardiovascular;  Laterality: N/A;  . CARDIOVASCULAR STRESS TEST  02/26/2010   EF 65%, SMALL AREA OF MILD INFARCT AND POSSIBLE PER-INFARCT ISCHEMIA IN THE INFEROBASAL WALL  . COLONOSCOPY    . CORONARY ANGIOPLASTY     RIGHT CORONARY WERE UNSUCCESSFUL  . CORONARY ARTERY BYPASS GRAFT N/A 02/25/2016   Procedure: CORONARY ARTERY BYPASS GRAFTING (CABG) times four using the left internal mammary artery and  the right saphenus vein ;  Surgeon: [name], MD;  Location: [facility];  Service: Open Heart Surgery;  Laterality: N/A;  . HERNIA REPAIR  1960   right hernia repair  . INTRAOPERATIVE TRANSESOPHAGEAL ECHOCARDIOGRAM N/A 02/25/2016   Procedure: INTRAOPERATIVE TRANSESOPHAGEAL ECHOCARDIOGRAM;  Surgeon: [name], MD;  Location: [facility];  Service: Open Heart Surgery;  Laterality: N/A;  . UMBILICAL HERNIA REPAIR    . US ECHOCARDIOGRAPHY  01/03/2007   EF 55-60%     Medications: Current Outpatient Prescriptions  Medication Sig Dispense Refill  . amLODipine (NORVASC) 5 MG tablet Take 5 mg by mouth daily.      Marland Kitchen aspirin 81 MG tablet Take 81 mg by mouth daily.      . clopidogrel (PLAVIX) 75 MG tablet Take 75 mg by mouth daily.      . Coenzyme Q10 (CO Q 10) 100 MG CAPS Take 100 mg by mouth daily.     . CRESTOR 10 MG tablet Take 10 mg by mouth every other day.     . doxazosin (CARDURA) 4 MG tablet Take 4 mg by mouth at bedtime.      Marland Kitchen ezetimibe (ZETIA) 10 MG tablet Take 10 mg by mouth daily.    . finasteride (PROSCAR) 5 MG tablet Take 5 mg by mouth daily.      . fish oil-omega-3 fatty acids 1000 MG capsule Take 1 g by mouth daily.      Marland Kitchen GLUCOSAMINE-CHONDROITIN PO Take 2 tablets by mouth daily.     Marland Kitchen latanoprost (XALATAN) 0.005 % ophthalmic solution Place 1 drop into both eyes daily.    Marland Kitchen losartan (COZAAR) 100 MG tablet Take 100 mg by mouth daily.      . metoprolol tartrate (LOPRESSOR) 25 MG tablet Take 1 tablet (25 mg total) by mouth 2 (two) times daily. 60 tablet 1  . Multiple Vitamins-Minerals (MULTIVITAMIN WITH MINERALS) tablet Take 1 tablet by mouth daily.    . Plant Sterols and Stanols (CHOLEST OFF) 450 MG TABS Take 900 mg by mouth daily.      No current facility-administered medications for this visit.     Allergies: No Known Allergies  Social History: The patient  reports that he quit smoking about 34 years ago. His smoking use included Cigarettes. He has a 25.00  pack-year smoking history. He has never used smokeless tobacco. He reports that he drinks about 2.4 oz of alcohol per week . He reports that he does not use drugs.   Family History: The patient's family history includes Congestive Heart Failure in his mother; Diabetes in his mother; Stroke in his father.   Review of Systems: Please see the history of present illness.   Otherwise, the review of systems is positive for none.  All other systems are reviewed and negative.   Physical Exam: VS:  BP 108/64   Pulse 68   Ht 5' 2.5" (1.588 m)   Wt 194 lb 12.8 oz (88.4 kg)   BMI 35.06 kg/m  .  BMI Body mass index is 35.06 kg/m.  Wt Readings from Last 3 Encounters:  04/11/16 194 lb 12.8 oz (88.4 kg)  04/05/16 194 lb 14.2 oz (88.4 kg)  03/28/16 193 lb (87.5 kg)    General: Pleasant. Well developed, well nourished and in no acute distress.   HEENT: Normal.  Neck: Supple, no JVD, carotid bruits, or masses noted.  Cardiac: Regular rate and rhythm. No murmurs, rubs, or gallops. No edema. May be a retained suture/scab that was removed with very little effort. Sternum looks fine.  Respiratory:  Lungs are clear to auscultation bilaterally with normal work of breathing.  GI: Soft and nontender.  MS: No deformity or atrophy. Gait and ROM intact.  Skin: Warm and dry. Color is normal.  Neuro:  Strength and sensation are intact and no gross focal deficits noted.  Psych: Alert, appropriate and with normal affect.   LABORATORY DATA:  EKG:  EKG is not ordered today.  Lab Results  Component Value Date   WBC 7.0 03/14/2016   HGB 12.1 (L) 03/14/2016   HCT 36.0 (L) 03/14/2016   PLT 409 (H) 03/14/2016   GLUCOSE 92 03/14/2016   ALT 30 02/23/2016   AST 28 02/23/2016   NA 136 03/14/2016   K 4.5 03/14/2016   CL 102 03/14/2016   CREATININE 0.86 03/14/2016   BUN 19 03/14/2016   CO2 26 03/14/2016   INR 1.35 02/25/2016   HGBA1C 6.2 (H) 02/23/2016    BNP (last 3 results) No results for input(s):  BNP in the last 8760 hours.  ProBNP (last 3 results) No results for input(s): PROBNP in the last 8760 hours.   Other Studies Reviewed Today:   Assessment/Plan: 1. CAD - post op CABG x 4 - doing well clinically. Making good progress. Starting rehab later this week.   2. HTN - BP ok on current regimen  3. HLD - on statin - labs done by PCP earlier this morning when fasting.   4. Carotid disease - checked annually  Current medicines are reviewed with the patient today.  The patient does not have concerns regarding medicines other than what has been noted above.  The following changes have been made:  See above.  Labs/ tests ordered today include:   No orders of the defined types were placed in this encounter.    Disposition:   FU with Dr. Percival Spanish in 3 months.  I will be happy to see back as needed.    Patient is agreeable to this plan and will call if any problems develop in the interim.   Signed: Burtis Junes, RN, ANP-C 04/11/2016 3:32 PM  [facility] [address] [place], [place]  [address] Phone: [phone] Fax: [phone]

## 2016-04-11 NOTE — Patient Instructions (Addendum)
We will be checking the following labs today - NONE   Medication Instructions:    Continue with your current medicines.     Testing/Procedures To Be Arranged:  N/A  Follow-Up:   See Dr. Percival Spanish in 3 months.     Other Special Instructions:   N/A    If you need a refill on your cardiac medications before your next appointment, please call your pharmacy.   Call the [facility] office at [phone] if you have any questions, problems or concerns.

## 2016-04-13 ENCOUNTER — Encounter (HOSPITAL_COMMUNITY)
Admission: RE | Admit: 2016-04-13 | Discharge: 2016-04-13 | Disposition: A | Discharge status: Home / Self Care | Payer: PPO | Source: Ambulatory Visit | Attending: Cardiology | Admitting: Cardiology

## 2016-04-13 DIAGNOSIS — Z951 Presence of aortocoronary bypass graft: Secondary | ICD-10-CM

## 2016-04-13 NOTE — Progress Notes (Signed)
Daily Session Note  Patient Details  Name: Curtis Tyler MRN: [id number] Date of Birth: 1940-11-08 Referring Provider:   Flowsheet Row CARDIAC REHAB PHASE II ORIENTATION from 04/05/2016 in [facility]  Referring Provider  Minus Breeding MD      Encounter Date: 04/13/2016  Check In:     Session Check In - 04/13/16 1030      Check-In   Location [facility]   Staff Present Roseanne Kaufman, RN, Clinical cytogeneticist, RN, BSN;Ramon Dredge, RN, MHA;Gianluca Chhim, RN, Deland Pretty, MS, ACSM CEP, Exercise Physiologist   Supervising physician immediately available to respond to emergencies [facility] immediately available   Physician(s) Dr. Letta Median   Medication changes reported     No   Fall or balance concerns reported    No   Warm-up and Cool-down Performed as group-led instruction   Resistance Training Performed No   VAD Patient? No     Pain Assessment   Currently in Pain? No/denies      Capillary Blood Glucose: No results found for this or any previous visit (from the past 24 hour(s)).   Goals Met:  Exercise tolerated well  Goals Unmet:  Not Applicable  Comments: Pt started cardiac rehab today.  Pt tolerated light exercise without difficulty. 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  Medication list reconciled. Pt denies barriers to medicaiton compliance.  PSYCHOSOCIAL ASSESSMENT:  PHQ-0. Pt exhibits positive coping skills, hopeful outlook with supportive family. No psychosocial needs identified at this time, no psychosocial interventions necessary.    Pt enjoys playing golf.   Pt oriented to exercise equipment and routine.    Understanding verbalized.Barnet Pall, RN,BSN 04/13/2016 11:17 AM   Dr. Fransico Him is Medical Director for Cardiac Rehab at [facility]

## 2016-04-14 DIAGNOSIS — E7800 Pure hypercholesterolemia, unspecified: Secondary | ICD-10-CM | POA: Diagnosis not present

## 2016-04-14 DIAGNOSIS — E785 Hyperlipidemia, unspecified: Secondary | ICD-10-CM | POA: Diagnosis not present

## 2016-04-14 DIAGNOSIS — I10 Essential (primary) hypertension: Secondary | ICD-10-CM | POA: Diagnosis not present

## 2016-04-14 DIAGNOSIS — Z789 Other specified health status: Secondary | ICD-10-CM | POA: Diagnosis not present

## 2016-04-14 DIAGNOSIS — R7303 Prediabetes: Secondary | ICD-10-CM | POA: Diagnosis not present

## 2016-04-14 NOTE — Progress Notes (Signed)
Cardiac Individual Treatment Plan  Patient Details  Name: Curtis Tyler MRN: [id number] Date of Birth: [birth year] Referring Provider:   Flowsheet Row CARDIAC REHAB PHASE II ORIENTATION from 04/05/2016 in [place]  Referring Provider  Minus Breeding MD      Initial Encounter Date:  [place] PHASE II ORIENTATION from 04/05/2016 in [facility]  Date  04/05/16  Referring Provider  Minus Breeding MD      Visit Diagnosis: S/P CABG x 4  Patient's Home Medications on Admission:  Current Outpatient Prescriptions:  .  amLODipine (NORVASC) 5 MG tablet, Take 5 mg by mouth daily.  , Disp: , Rfl:  .  aspirin 81 MG tablet, Take 81 mg by mouth daily.  , Disp: , Rfl:  .  clopidogrel (PLAVIX) 75 MG tablet, Take 75 mg by mouth daily.  , Disp: , Rfl:  .  Coenzyme Q10 (CO Q 10) 100 MG CAPS, Take 100 mg by mouth daily. , Disp: , Rfl:  .  CRESTOR 10 MG tablet, Take 10 mg by mouth every other day. , Disp: , Rfl:  .  doxazosin (CARDURA) 4 MG tablet, Take 4 mg by mouth at bedtime.  , Disp: , Rfl:  .  ezetimibe (ZETIA) 10 MG tablet, Take 10 mg by mouth daily., Disp: , Rfl:  .  finasteride (PROSCAR) 5 MG tablet, Take 5 mg by mouth daily.  , Disp: , Rfl:  .  fish oil-omega-3 fatty acids 1000 MG capsule, Take 1 g by mouth daily.  , Disp: , Rfl:  .  GLUCOSAMINE-CHONDROITIN PO, Take 2 tablets by mouth daily. , Disp: , Rfl:  .  losartan (COZAAR) 100 MG tablet, Take 100 mg by mouth daily.  , Disp: , Rfl:  .  metoprolol tartrate (LOPRESSOR) 25 MG tablet, Take 1 tablet (25 mg total) by mouth 2 (two) times daily., Disp: 60 tablet, Rfl: 1 .  Multiple Vitamins-Minerals (MULTIVITAMIN WITH MINERALS) tablet, Take 1 tablet by mouth daily., Disp: , Rfl:  .  Plant Sterols and Stanols (CHOLEST OFF) 450 MG TABS, Take 900 mg by mouth daily. , Disp: , Rfl:  .  latanoprost (XALATAN) 0.005 % ophthalmic solution, Place 1 drop into both eyes daily.,  Disp: , Rfl:   Past Medical History: Past Medical History:  Diagnosis Date  . Arthritis   . BPH (benign prostatic hyperplasia)   . Bunion   . Callus   . Carotid artery occlusion   . Coronary artery disease    left main 60% stenosis. The LAD had a proximal 60-70% stenosis. There was mid 40% stenosis. Diagonal is moderate size with 40% stenosis. The circumflex system included a large OM with 60% ostial stenosis the RCA was occluded before the PDA. The EF was 65%.  2014  . Hyperlipidemia   . Hypertension   . Myocardial infarct ([place]) 1992   inferior posterior  . Obesity   . Radiculopathy   . Shortness of breath dyspnea    with exertion -     Tobacco Use: History  Smoking Status  . Former Smoker  . Packs/day: 1.00  . Years: 25.00  . Types: Cigarettes  . Quit date: 07/23/1981  Smokeless Tobacco  . Never Used    Labs: Recent Review Flowsheet Data    Labs for ITP Cardiac and Pulmonary Rehab Latest Ref Rng & Units 02/25/2016 02/25/2016 02/25/2016 02/25/2016 02/26/2016   Hemoglobin A1c 4.8 - 5.6 % - - - - -  PHART 7.350 - 7.450 7.298(L) - 7.388 7.379 -   PCO2ART 35.0 - 45.0 mmHg 49.5(H) - 39.5 40.0 -   HCO3 20.0 - 24.0 mEq/L 24.2(H) - 23.7 23.5 -   TCO2 0 - 100 mmol/L 26 25 25 25 26    ACIDBASEDEF 0.0 - 2.0 mmol/L 2.0 - 1.0 1.0 -   O2SAT % 91.0 - 94.0 94.0 -      Capillary Blood Glucose: Lab Results  Component Value Date   GLUCAP 156 (H) 02/29/2016   GLUCAP 117 (H) 02/29/2016   GLUCAP 88 02/28/2016   GLUCAP 163 (H) 02/28/2016   GLUCAP 132 (H) 02/28/2016     Exercise Target Goals:    Exercise Program Goal: Individual exercise prescription set with THRR, safety & activity barriers. Participant demonstrates ability to understand and report RPE using BORG scale, to self-measure pulse accurately, and to acknowledge the importance of the exercise prescription.  Exercise Prescription Goal: Starting with aerobic activity 30 plus minutes a day, 3 days per week for initial  exercise prescription. Provide home exercise prescription and guidelines that participant acknowledges understanding prior to discharge.  Activity Barriers & Risk Stratification:     Activity Barriers & Cardiac Risk Stratification - 04/05/16 1455      Activity Barriers & Cardiac Risk Stratification   Activity Barriers Arthritis;Incisional Pain   Cardiac Risk Stratification High      6 Minute Walk:     6 Minute Walk    Row Name 04/05/16 1644         6 Minute Walk   Phase Initial     Distance 1661 feet     Walk Time 6 minutes     # of Rest Breaks 0     MPH 3.15     METS 2.53     RPE 12     VO2 Peak 8.85     Symptoms No     Resting HR 66 bpm     Resting BP 124/70     Max Ex. HR 91 bpm     Max Ex. BP 122/70     2 Minute Post BP 118/60        Initial Exercise Prescription:     Initial Exercise Prescription - 04/05/16 1600      Date of Initial Exercise RX and Referring Provider   Date 04/05/16   Referring Provider Minus Breeding MD     Bike   Level 0.7   Minutes 10   METs 2.51     NuStep   Level 3   Minutes 10   METs 2.5     Track   Laps 11   Minutes 10   METs 2.5     Prescription Details   Frequency (times per week) 3   Duration Progress to 30 minutes of continuous aerobic without signs/symptoms of physical distress     Intensity   THRR 40-80% of Max Heartrate 57-114   Ratings of Perceived Exertion 11-13   Perceived Dyspnea 0-4     Progression   Progression Continue to progress workloads to maintain intensity without signs/symptoms of physical distress.     Resistance Training   Training Prescription Yes   Weight 2 lbs   Reps 10-12      Perform Capillary Blood Glucose checks as needed.  Exercise Prescription Changes:   Exercise Comments:   Discharge Exercise Prescription (Final Exercise Prescription Changes):   Nutrition:  Target Goals: Understanding of nutrition guidelines, daily intake of sodium 1500mg , cholesterol 200mg ,  calories 30% from fat and 7% or less from saturated fats, daily to have 5 or more servings of fruits and vegetables.  Biometrics:     Pre Biometrics - 04/05/16 1635      Pre Biometrics   Height 5' 2.5" (1.588 m)   Weight 194 lb 14.2 oz (88.4 kg)   Waist Circumference 42 inches   Hip Circumference 41.75 inches   Waist to Hip Ratio 1.01 %   BMI (Calculated) 35.2   Triceps Skinfold 15 mm   % Body Fat 32.4 %   Grip Strength 41 kg   Flexibility 9.5 in   Single Leg Stand 2.75 seconds       Nutrition Therapy Plan and Nutrition Goals:     Nutrition Therapy & Goals - 04/11/16 1429      Nutrition Therapy   Diet Therapeutic Lifestyle Changes     Personal Nutrition Goals   Personal Goal #1 1-2 lb wt loss per week to a wt loss goal of 6-24 lb    Personal Goal #2 Pt to have a basic understanding of a diabetic diet     Intervention Plan   Intervention Prescribe, educate and counsel regarding individualized specific dietary modifications aiming towards targeted core components such as weight, hypertension, lipid management, diabetes, heart failure and other comorbidities.   Expected Outcomes Short Term Goal: Understand basic principles of dietary content, such as calories, fat, sodium, cholesterol and nutrients.;Long Term Goal: Adherence to prescribed nutrition plan.      Nutrition Discharge: Nutrition Scores:     Nutrition Assessments - 04/11/16 1429      MEDFICTS Scores   Pre Score 18      Nutrition Goals Re-Evaluation:   Psychosocial: Target Goals: Acknowledge presence or absence of depression, maximize coping skills, provide positive support system. Participant is able to verbalize types and ability to use techniques and skills needed for reducing stress and depression.  Initial Review & Psychosocial Screening:     Initial Psych Review & Screening - 04/05/16 [place]? Yes     Barriers   Psychosocial barriers to participate  in program The patient should benefit from training in stress management and relaxation.;There are no identifiable barriers or psychosocial needs.     Screening Interventions   Interventions Encouraged to exercise      Quality of Life Scores:     Quality of Life - 04/05/16 1603      Quality of Life Scores   Health/Function Pre 27.69 %   Socioeconomic Pre 30 %   Psych/Spiritual Pre 29.14 %   Family Pre 30 %   GLOBAL Pre 28.76 %      PHQ-9: Recent Review Flowsheet Data    Depression screen [facility] 2/9 04/13/2016   Decreased Interest 0   Down, Depressed, Hopeless 0   PHQ - 2 Score 0      Psychosocial Evaluation and Intervention:   Psychosocial Re-Evaluation:     Psychosocial Re-Evaluation    Row Name 04/14/16 0940             Psychosocial Re-Evaluation   Interventions Encouraged to attend Cardiac Rehabilitation for the exercise       Continued Psychosocial Services Needed No          Vocational Rehabilitation: Provide vocational rehab assistance to qualifying candidates.   Vocational Rehab Evaluation & Intervention:     Vocational Rehab - 04/05/16 1603      Initial Vocational  Rehab Evaluation & Intervention   Assessment shows need for Vocational Rehabilitation No      Education: Education Goals: Education classes will be provided on a weekly basis, covering required topics. Participant will state understanding/return demonstration of topics presented.  Learning Barriers/Preferences:     Learning Barriers/Preferences - 04/07/16 1008      Learning Barriers/Preferences   Learning Preferences Pictoral      Education Topics: Count Your Pulse:  -Group instruction provided by verbal instruction, demonstration, patient participation and written materials to support subject.  Instructors address importance of being able to find your pulse and how to count your pulse when at home without a heart monitor.  Patients get hands on experience counting their pulse  with staff help and individually.   Heart Attack, Angina, and Risk Factor Modification:  -Group instruction provided by verbal instruction, video, and written materials to support subject.  Instructors address signs and symptoms of angina and heart attacks.    Also discuss risk factors for heart disease and how to make changes to improve heart health risk factors.   Functional Fitness:  -Group instruction provided by verbal instruction, demonstration, patient participation, and written materials to support subject.  Instructors address safety measures for doing things around the house.  Discuss how to get up and down off the floor, how to pick things up properly, how to safely get out of a chair without assistance, and balance training.   Meditation and Mindfulness:  -Group instruction provided by verbal instruction, patient participation, and written materials to support subject.  Instructor addresses importance of mindfulness and meditation practice to help reduce stress and improve awareness.  Instructor also leads participants through a meditation exercise.    Stretching for Flexibility and Mobility:  -Group instruction provided by verbal instruction, patient participation, and written materials to support subject.  Instructors lead participants through series of stretches that are designed to increase flexibility thus improving mobility.  These stretches are additional exercise for major muscle groups that are typically performed during regular warm up and cool down.   Hands Only CPR Anytime:  -Group instruction provided by verbal instruction, video, patient participation and written materials to support subject.  Instructors co-teach with AHA video for hands only CPR.  Participants get hands on experience with mannequins.   Nutrition I class: Heart Healthy Eating:  -Group instruction provided by PowerPoint slides, verbal discussion, and written materials to support subject matter. The  instructor gives an explanation and review of the Therapeutic Lifestyle Changes diet recommendations, which includes a discussion on lipid goals, dietary fat, sodium, fiber, plant stanol/sterol esters, sugar, and the components of a well-balanced, healthy diet.   Nutrition II class: Lifestyle Skills:  -Group instruction provided by PowerPoint slides, verbal discussion, and written materials to support subject matter. The instructor gives an explanation and review of label reading, grocery shopping for heart health, heart healthy recipe modifications, and ways to make healthier choices when eating out.   Diabetes Question & Answer:  -Group instruction provided by PowerPoint slides, verbal discussion, and written materials to support subject matter. The instructor gives an explanation and review of diabetes co-morbidities, pre- and post-prandial blood glucose goals, pre-exercise blood glucose goals, signs, symptoms, and treatment of hypoglycemia and hyperglycemia, and foot care basics.   Diabetes Blitz:  -Group instruction provided by PowerPoint slides, verbal discussion, and written materials to support subject matter. The instructor gives an explanation and review of the physiology behind type 1 and type 2 diabetes, diabetes medications and rational  behind using different medications, pre- and post-prandial blood glucose recommendations and Hemoglobin A1c goals, diabetes diet, and exercise including blood glucose guidelines for exercising safely.    Portion Distortion:  -Group instruction provided by PowerPoint slides, verbal discussion, written materials, and food models to support subject matter. The instructor gives an explanation of serving size versus portion size, changes in portions sizes over the last 20 years, and what consists of a serving from each food group.   Stress Management:  -Group instruction provided by verbal instruction, video, and written materials to support subject  matter.  Instructors review role of stress in heart disease and how to cope with stress positively.     Exercising on Your Own:  -Group instruction provided by verbal instruction, power point, and written materials to support subject.  Instructors discuss benefits of exercise, components of exercise, frequency and intensity of exercise, and end points for exercise.  Also discuss use of nitroglycerin and activating EMS.  Review options of places to exercise outside of rehab.  Review guidelines for sex with heart disease.   Cardiac Drugs I:  -Group instruction provided by verbal instruction and written materials to support subject.  Instructor reviews cardiac drug classes: antiplatelets, anticoagulants, beta blockers, and statins.  Instructor discusses reasons, side effects, and lifestyle considerations for each drug class.   Cardiac Drugs II:  -Group instruction provided by verbal instruction and written materials to support subject.  Instructor reviews cardiac drug classes: angiotensin converting enzyme inhibitors (ACE-I), angiotensin II receptor blockers (ARBs), nitrates, and calcium channel blockers.  Instructor discusses reasons, side effects, and lifestyle considerations for each drug class.   Anatomy and Physiology of the Circulatory System:  -Group instruction provided by verbal instruction, video, and written materials to support subject.  Reviews functional anatomy of heart, how it relates to various diagnoses, and what role the heart plays in the overall system.   Knowledge Questionnaire Score:     Knowledge Questionnaire Score - 04/05/16 1602      Knowledge Questionnaire Score   Pre Score 19/24      Core Components/Risk Factors/Patient Goals at Admission:     Personal Goals and Risk Factors at Admission - 04/05/16 1639      Core Components/Risk Factors/Patient Goals on Admission    Weight Management Weight Loss;Yes   Intervention Weight Management: Provide education and  appropriate resources to help participant work on and attain dietary goals.;Weight Management/Obesity: Establish reasonable short term and long term weight goals.;Weight Management: Develop a combined nutrition and exercise program designed to reach desired caloric intake, while maintaining appropriate intake of nutrient and fiber, sodium and fats, and appropriate energy expenditure required for the weight goal.   Admit Weight 194 lb 14.2 oz (88.4 kg)   Goal Weight: Short Term 188 lb (85.3 kg)   Goal Weight: Long Term 180 lb (81.6 kg)   Expected Outcomes Short Term: Continue to assess and modify interventions until short term weight is achieved;Long Term: Adherence to nutrition and physical activity/exercise program aimed toward attainment of established weight goal;Weight Loss: Understanding of general recommendations for a balanced deficit meal plan, which promotes 1-2 lb weight loss per week and includes a negative energy balance of [phone] kcal/d   Hypertension Yes   Intervention Provide education on lifestyle modifcations including regular physical activity/exercise, weight management, moderate sodium restriction and increased consumption of fresh fruit, vegetables, and low fat dairy, alcohol moderation, and smoking cessation.;Monitor prescription use compliance.   Expected Outcomes Short Term: Continued assessment and intervention until  BP is < 140/24mm HG in hypertensive participants. < 130/37mm HG in hypertensive participants with diabetes, heart failure or chronic kidney disease.;Long Term: Maintenance of blood pressure at goal levels.   Lipids Yes   Intervention Provide education and support for participant on nutrition & aerobic/resistive exercise along with prescribed medications to achieve LDL 70mg , HDL >40mg .   Expected Outcomes Long Term: Cholesterol controlled with medications as prescribed, with individualized exercise RX and with personalized nutrition plan. Value goals: LDL < 70mg , HDL  > 40 mg.;Short Term: Participant states understanding of desired cholesterol values and is compliant with medications prescribed. Participant is following exercise prescription and nutrition guidelines.      Core Components/Risk Factors/Patient Goals Review:    Core Components/Risk Factors/Patient Goals at Discharge (Final Review):    ITP Comments:     ITP Comments    Row Name 04/07/16 1013           ITP Comments Dr Fransico Him is the medical director for cardiac rehab          Comments: Wille Glaser is making expected progress toward personal goals after completing 2 sessions. Recommend continued exercise and life style modification education including  stress management and relaxation techniques to decrease cardiac risk profile. Barnet Pall, RN,BSN 04/15/2016 9:53 AM

## 2016-04-15 ENCOUNTER — Encounter (HOSPITAL_COMMUNITY)
Admission: RE | Admit: 2016-04-15 | Discharge: 2016-04-15 | Disposition: A | Discharge status: Home / Self Care | Payer: PPO | Source: Ambulatory Visit | Attending: Cardiology | Admitting: Cardiology

## 2016-04-15 DIAGNOSIS — Z951 Presence of aortocoronary bypass graft: Secondary | ICD-10-CM

## 2016-04-22 ENCOUNTER — Encounter (HOSPITAL_COMMUNITY)
Admission: RE | Admit: 2016-04-22 | Discharge: 2016-04-22 | Disposition: A | Discharge status: Home / Self Care | Payer: PPO | Source: Ambulatory Visit | Attending: Cardiology | Admitting: Cardiology

## 2016-04-22 DIAGNOSIS — Z951 Presence of aortocoronary bypass graft: Secondary | ICD-10-CM | POA: Diagnosis not present

## 2016-04-22 NOTE — Progress Notes (Signed)
Reviewed home exercise with pt today.  Pt plans to walking and going to [facility] for exercise. Pt will do 20 minutes on the elliptical, 10 minutes on walking track and 10 minutes on the Nustep. Pt will exercise 2-3x/week in addition to coming to cardiac rehab. Reviewed THR, pulse, RPE, sign and symptoms, and when to call 911 or MD.  Also discussed weather considerations and indoor options.  Pt voiced understanding.   Drezden Seitzinger [employer]

## 2016-04-25 ENCOUNTER — Encounter (HOSPITAL_COMMUNITY)
Admission: RE | Admit: 2016-04-25 | Discharge: 2016-04-25 | Disposition: A | Discharge status: Home / Self Care | Payer: PPO | Source: Ambulatory Visit | Attending: Cardiology | Admitting: Cardiology

## 2016-04-25 ENCOUNTER — Telehealth (HOSPITAL_COMMUNITY): Payer: Self-pay | Admitting: *Deleted

## 2016-04-25 DIAGNOSIS — Z951 Presence of aortocoronary bypass graft: Secondary | ICD-10-CM | POA: Diagnosis not present

## 2016-04-27 ENCOUNTER — Encounter (HOSPITAL_COMMUNITY)
Admission: RE | Admit: 2016-04-27 | Discharge: 2016-04-27 | Disposition: A | Discharge status: Home / Self Care | Payer: PPO | Source: Ambulatory Visit | Attending: Cardiology | Admitting: Cardiology

## 2016-04-27 DIAGNOSIS — Z951 Presence of aortocoronary bypass graft: Secondary | ICD-10-CM

## 2016-04-29 ENCOUNTER — Encounter (HOSPITAL_COMMUNITY)
Admission: RE | Admit: 2016-04-29 | Discharge: 2016-04-29 | Disposition: A | Discharge status: Home / Self Care | Payer: PPO | Source: Ambulatory Visit | Attending: Cardiology | Admitting: Cardiology

## 2016-04-29 DIAGNOSIS — Z951 Presence of aortocoronary bypass graft: Secondary | ICD-10-CM | POA: Diagnosis not present

## 2016-05-03 ENCOUNTER — Encounter: Payer: Self-pay | Admitting: Thoracic Surgery (Cardiothoracic Vascular Surgery)

## 2016-05-03 ENCOUNTER — Ambulatory Visit (INDEPENDENT_AMBULATORY_CARE_PROVIDER_SITE_OTHER): Payer: Self-pay | Admitting: Thoracic Surgery (Cardiothoracic Vascular Surgery)

## 2016-05-03 VITALS — BP 107/63 | HR 65 | Resp 16 | Ht 62.5 in | Wt 194.0 lb

## 2016-05-03 DIAGNOSIS — I2510 Atherosclerotic heart disease of native coronary artery without angina pectoris: Secondary | ICD-10-CM

## 2016-05-03 DIAGNOSIS — Z951 Presence of aortocoronary bypass graft: Secondary | ICD-10-CM

## 2016-05-03 NOTE — Progress Notes (Signed)
[address]       [place] [address]             [phone]       HPI: Curtis Tyler returns today for a scheduled follow-up visit.  He is a 75 year old man who underwent coronary bypass grafting 4 on 02/25/2016. His postoperative course was uncomplicated. He has been in the office a couple of times for routine postop follow-up. He now returns for a final visit.  He feels well. His exercise tolerance is excellent. He is a little frustrated with cardiac rehabilitation as he feels they're not letting him push himself enough. He is anxious to resume normal activities. He occasionally has sensation of pain around his sternum, but is not taking anything for that. He says that he'll occasionally have a very brief sensation of shortness of breath that usually last only a few seconds.  Past Medical History:  Diagnosis Date  . Arthritis   . BPH (benign prostatic hyperplasia)   . Bunion   . Callus   . Carotid artery occlusion   . Coronary artery disease    left main 60% stenosis. The LAD had a proximal 60-70% stenosis. There was mid 40% stenosis. Diagonal is moderate size with 40% stenosis. The circumflex system included a large OM with 60% ostial stenosis the RCA was occluded before the PDA. The EF was 65%.  2014  . Hyperlipidemia   . Hypertension   . Myocardial infarct (Curtis Tyler) 1992   inferior posterior  . Obesity   . Radiculopathy   . Shortness of breath dyspnea    with exertion -       Current Outpatient Prescriptions  Medication Sig Dispense Refill  . amLODipine (NORVASC) 5 MG tablet Take 5 mg by mouth daily.      Marland Kitchen aspirin 81 MG tablet Take 81 mg by mouth daily.      . clopidogrel (PLAVIX) 75 MG tablet Take 75 mg by mouth daily.      . Coenzyme Q10 (CO Q 10) 100 MG CAPS Take 100 mg by mouth daily.     . CRESTOR 10 MG tablet Take 10 mg by mouth every other day.     . doxazosin (CARDURA) 4 MG tablet Take 4 mg by mouth at bedtime.      Marland Kitchen ezetimibe (ZETIA) 10  MG tablet Take 10 mg by mouth daily.    . finasteride (PROSCAR) 5 MG tablet Take 5 mg by mouth daily.      . fish oil-omega-3 fatty acids 1000 MG capsule Take 1 g by mouth daily.      Marland Kitchen gabapentin (NEURONTIN) 300 MG capsule Take 300 mg by mouth at bedtime.    Marland Kitchen GLUCOSAMINE-CHONDROITIN PO Take 2 tablets by mouth daily.     Marland Kitchen losartan (COZAAR) 100 MG tablet Take 100 mg by mouth daily.      . metoprolol tartrate (LOPRESSOR) 25 MG tablet Take 1 tablet (25 mg total) by mouth 2 (two) times daily. 60 tablet 1  . Multiple Vitamins-Minerals (MULTIVITAMIN WITH MINERALS) tablet Take 1 tablet by mouth daily.    . Plant Sterols and Stanols (CHOLEST OFF) 450 MG TABS Take 900 mg by mouth daily.     Marland Kitchen latanoprost (XALATAN) 0.005 % ophthalmic solution Place 1 drop into both eyes daily.     No current facility-administered medications for this visit.     Physical Exam BP 107/63   Pulse 65   Resp 16  Ht 5' 2.5" (1.588 m)   Wt 194 lb (88 kg)   SpO2 98% Comment: ON RA  BMI 34.92 kg/m  75 year old man in no acute distress Alert and oriented 3 with no focal deficits Lungs clear with equal breath sounds bilaterally Cardiac regular rate and rhythm normal S1 and S2 no rubs, murmurs or gallops Trace edema in right lower extremity Sternum stable, incision well-healed Leg incision well-healed  Diagnostic Tests: I reviewed his most recent chest x-ray from 03/28/2016. It showed no significant effusions.  Impression: Curtis Tyler is a 75 year old man who is now about 2 months out from coronary bypass grafting. He is doing well. He has no anginal symptoms, but was relatively asymptomatic prior to surgery. His exercise tolerance is good and he is mostly limited by pain in his legs.  At this point there are no restrictions on his activities. He may gradually resume all activities including golfing.  He is okay to drive from a surgical standpoint.  Plan: He has a follow-up appointment scheduled with Dr.  Percival Spanish in 2 months.  I will be happy to see him back at any time the future if I can be of any further assistance with his care.  Curtis Nakayama, MD [facility] [phone]

## 2016-05-04 ENCOUNTER — Encounter (HOSPITAL_COMMUNITY)
Admission: RE | Admit: 2016-05-04 | Discharge: 2016-05-04 | Disposition: A | Discharge status: Home / Self Care | Payer: PPO | Source: Ambulatory Visit | Attending: Cardiology | Admitting: Cardiology

## 2016-05-04 DIAGNOSIS — Z951 Presence of aortocoronary bypass graft: Secondary | ICD-10-CM

## 2016-05-06 ENCOUNTER — Encounter (HOSPITAL_COMMUNITY)
Admission: RE | Admit: 2016-05-06 | Discharge: 2016-05-06 | Disposition: A | Discharge status: Home / Self Care | Payer: PPO | Source: Ambulatory Visit | Attending: Cardiology | Admitting: Cardiology

## 2016-05-06 VITALS — BP 100/72 | HR 65 | Ht 62.5 in | Wt 194.7 lb

## 2016-05-06 DIAGNOSIS — Z951 Presence of aortocoronary bypass graft: Secondary | ICD-10-CM

## 2016-05-06 NOTE — Progress Notes (Signed)
Discharge Summary  Patient Details  Name: Curtis Tyler MRN: [id number] Date of Birth: [birth year] Referring Provider:   Flowsheet Row CARDIAC REHAB PHASE II ORIENTATION from 04/05/2016 in [place]  Referring Provider  Minus Breeding MD       Number of Visits: 9  Reason for Discharge:  Patient reached a stable level of exercise. Early Exit:  pt prefered to continue exercise on his own  Smoking History:  History  Smoking Status  . Former Smoker  . Packs/day: 1.00  . Years: 25.00  . Types: Cigarettes  . Quit date: 07/23/1981  Smokeless Tobacco  . Never Used    Diagnosis:  S/P CABG x 4  ADL UCSD:   Initial Exercise Prescription:     Initial Exercise Prescription - 04/05/16 1600      Date of Initial Exercise RX and Referring Provider   Date 04/05/16   Referring Provider Minus Breeding MD     Bike   Level 0.7   Minutes 10   METs 2.51     NuStep   Level 3   Minutes 10   METs 2.5     Track   Laps 11   Minutes 10   METs 2.5     Prescription Details   Frequency (times per week) 3   Duration Progress to 30 minutes of continuous aerobic without signs/symptoms of physical distress     Intensity   THRR 40-80% of Max Heartrate 57-114   Ratings of Perceived Exertion 11-13   Perceived Dyspnea 0-4     Progression   Progression Continue to progress workloads to maintain intensity without signs/symptoms of physical distress.     Resistance Training   Training Prescription Yes   Weight 2 lbs   Reps 10-12      Discharge Exercise Prescription (Final Exercise Prescription Changes):     Exercise Prescription Changes - 05/17/16 1000      Response to Exercise   Blood Pressure (Admit) 100/72   Blood Pressure (Exercise) 100/62   Blood Pressure (Exit) 93/59   Heart Rate (Admit) 65 bpm   Heart Rate (Exercise) 105 bpm   Heart Rate (Exit) 73 bpm   Rating of Perceived Exertion (Exercise) 12   Symptoms none   Comments Home  exercise guidelines reviewed on 04/22/16.   Duration Progress to 30 minutes of continuous aerobic without signs/symptoms of physical distress   Intensity THRR unchanged     Progression   Progression Continue to progress workloads to maintain intensity without signs/symptoms of physical distress.   Average METs 4.8     Resistance Training   Training Prescription Yes   Weight 3lbs.   Reps 10-12     Interval Training   Interval Training No     Treadmill   MPH 3   Grade 4   Minutes 10   METs 4.95     Bike   Level --   Minutes --   METs --     NuStep   Level 4   Minutes 10   METs 4.7     Track   Laps 21   Minutes 10   METs 4.65     Home Exercise Plan   Plans to continue exercise at [employer] (comment)   Frequency Add 4 additional days to program exercise sessions.      Functional Capacity:     6 Minute Walk    Row Name 04/05/16 1644 05/06/16 1004  6 Minute Walk   Phase Initial Discharge    Distance 1661 feet 1784 feet    Distance % Change  - 7.41 %    Walk Time 6 minutes 6 minutes    # of Rest Breaks 0 0    MPH 3.15 3.38    METS 2.53 2.6    RPE 12 11    VO2 Peak 8.85 9.12    Symptoms No No    Resting HR 66 bpm 65 bpm    Resting BP 124/70 100/72    Max Ex. HR 91 bpm 92 bpm    Max Ex. BP 122/70 100/62    2 Minute Post BP 118/60  -       Psychological, QOL, Others - Outcomes: PHQ 2/9: Depression screen PHQ 2/9 04/13/2016  Decreased Interest 0  Down, Depressed, Hopeless 0  PHQ - 2 Score 0    Quality of Life:     Quality of Life - 04/05/16 1603      Quality of Life Scores   Health/Function Pre 27.69 %   Socioeconomic Pre 30 %   Psych/Spiritual Pre 29.14 %   Family Pre 30 %   GLOBAL Pre 28.76 %      Personal Goals: Goals established at orientation with interventions provided to work toward goal.     Personal Goals and Risk Factors at Admission - 04/05/16 1639      Core Components/Risk Factors/Patient Goals on Admission     Weight Management Weight Loss;Yes   Intervention Weight Management: Provide education and appropriate resources to help participant work on and attain dietary goals.;Weight Management/Obesity: Establish reasonable short term and long term weight goals.;Weight Management: Develop a combined nutrition and exercise program designed to reach desired caloric intake, while maintaining appropriate intake of nutrient and fiber, sodium and fats, and appropriate energy expenditure required for the weight goal.   Admit Weight 194 lb 14.2 oz (88.4 kg)   Goal Weight: Short Term 188 lb (85.3 kg)   Goal Weight: Long Term 180 lb (81.6 kg)   Expected Outcomes Short Term: Continue to assess and modify interventions until short term weight is achieved;Long Term: Adherence to nutrition and physical activity/exercise program aimed toward attainment of established weight goal;Weight Loss: Understanding of general recommendations for a balanced deficit meal plan, which promotes 1-2 lb weight loss per week and includes a negative energy balance of [phone] kcal/d   Hypertension Yes   Intervention Provide education on lifestyle modifcations including regular physical activity/exercise, weight management, moderate sodium restriction and increased consumption of fresh fruit, vegetables, and low fat dairy, alcohol moderation, and smoking cessation.;Monitor prescription use compliance.   Expected Outcomes Short Term: Continued assessment and intervention until BP is < 140/9mm HG in hypertensive participants. < 130/17mm HG in hypertensive participants with diabetes, heart failure or chronic kidney disease.;Long Term: Maintenance of blood pressure at goal levels.   Lipids Yes   Intervention Provide education and support for participant on nutrition & aerobic/resistive exercise along with prescribed medications to achieve LDL 70mg , HDL >40mg .   Expected Outcomes Long Term: Cholesterol controlled with medications as prescribed,  with individualized exercise RX and with personalized nutrition plan. Value goals: LDL < 70mg , HDL > 40 mg.;Short Term: Participant states understanding of desired cholesterol values and is compliant with medications prescribed. Participant is following exercise prescription and nutrition guidelines.       Personal Goals Discharge:     Goals and Risk Factor Review    Row Name 05/17/16 1003  Core Components/Risk Factors/Patient Goals Review   Personal Goals Review Weight Management/Obesity;Hypertension       Review Patient's weight remained the same during his time in cardiac rehab. Pt's blood pressure was within normal limits during his time in cardiac rehab.       Expected Outcomes Continue exercise 45 minutes, daily at a fitness center.          Nutrition & Weight - Outcomes:     Pre Biometrics - 04/05/16 1635      Pre Biometrics   Height 5' 2.5" (1.588 m)   Weight 194 lb 14.2 oz (88.4 kg)   Waist Circumference 42 inches   Hip Circumference 41.75 inches   Waist to Hip Ratio 1.01 %   BMI (Calculated) 35.2   Triceps Skinfold 15 mm   % Body Fat 32.4 %   Grip Strength 41 kg   Flexibility 9.5 in   Single Leg Stand 2.75 seconds         Post Biometrics - 05/06/16 1004       Post  Biometrics   Height 5' 2.5" (1.588 m)   Weight 194 lb 10.7 oz (88.3 kg)   Waist Circumference 46 inches   Hip Circumference 44 inches   Waist to Hip Ratio 1.05 %   BMI (Calculated) 35.1   Triceps Skinfold 20 mm   % Body Fat 35.5 %   Grip Strength 39.5 kg   Flexibility 8 in   Single Leg Stand 4.03 seconds      Nutrition:     Nutrition Therapy & Goals - 04/11/16 1429      Nutrition Therapy   Diet Therapeutic Lifestyle Changes     Personal Nutrition Goals   Personal Goal #1 1-2 lb wt loss per week to a wt loss goal of 6-24 lb    Personal Goal #2 Pt to have a basic understanding of a diabetic diet     Intervention Plan   Intervention Prescribe, educate and counsel  regarding individualized specific dietary modifications aiming towards targeted core components such as weight, hypertension, lipid management, diabetes, heart failure and other comorbidities.   Expected Outcomes Short Term Goal: Understand basic principles of dietary content, such as calories, fat, sodium, cholesterol and nutrients.;Long Term Goal: Adherence to prescribed nutrition plan.      Nutrition Discharge:     Nutrition Assessments - 04/11/16 1429      MEDFICTS Scores   Pre Score 18      Education Questionnaire Score:     Knowledge Questionnaire Score - 04/05/16 1602      Knowledge Questionnaire Score   Pre Score 19/24

## 2016-05-09 ENCOUNTER — Encounter (HOSPITAL_COMMUNITY): Payer: PPO

## 2016-05-11 ENCOUNTER — Encounter (HOSPITAL_COMMUNITY): Payer: PPO

## 2016-05-13 ENCOUNTER — Encounter (HOSPITAL_COMMUNITY): Payer: PPO

## 2016-05-16 ENCOUNTER — Encounter (HOSPITAL_COMMUNITY): Payer: PPO

## 2016-05-18 ENCOUNTER — Encounter (HOSPITAL_COMMUNITY): Payer: PPO

## 2016-05-20 ENCOUNTER — Encounter (HOSPITAL_COMMUNITY): Payer: PPO

## 2016-05-23 ENCOUNTER — Encounter (HOSPITAL_COMMUNITY): Payer: PPO

## 2016-05-25 ENCOUNTER — Encounter (HOSPITAL_COMMUNITY): Payer: PPO

## 2016-05-26 ENCOUNTER — Encounter (HOSPITAL_COMMUNITY): Payer: Self-pay | Admitting: *Deleted

## 2016-05-27 ENCOUNTER — Encounter (HOSPITAL_COMMUNITY): Payer: PPO

## 2016-05-30 ENCOUNTER — Encounter (HOSPITAL_COMMUNITY): Payer: PPO

## 2016-06-01 ENCOUNTER — Encounter (HOSPITAL_COMMUNITY): Payer: PPO

## 2016-06-03 ENCOUNTER — Encounter (HOSPITAL_COMMUNITY): Payer: PPO

## 2016-06-06 ENCOUNTER — Encounter (HOSPITAL_COMMUNITY): Payer: PPO

## 2016-06-08 ENCOUNTER — Encounter (HOSPITAL_COMMUNITY): Payer: PPO

## 2016-06-10 ENCOUNTER — Encounter (HOSPITAL_COMMUNITY): Payer: PPO

## 2016-06-13 ENCOUNTER — Encounter (HOSPITAL_COMMUNITY): Payer: PPO

## 2016-06-15 ENCOUNTER — Encounter (HOSPITAL_COMMUNITY): Payer: PPO

## 2016-06-17 ENCOUNTER — Encounter (HOSPITAL_COMMUNITY): Payer: PPO

## 2016-06-20 ENCOUNTER — Encounter (HOSPITAL_COMMUNITY): Payer: PPO

## 2016-06-22 ENCOUNTER — Encounter (HOSPITAL_COMMUNITY): Payer: PPO

## 2016-06-24 ENCOUNTER — Encounter (HOSPITAL_COMMUNITY): Payer: PPO

## 2016-06-27 ENCOUNTER — Encounter (HOSPITAL_COMMUNITY): Payer: PPO

## 2016-06-29 ENCOUNTER — Encounter (HOSPITAL_COMMUNITY): Payer: PPO

## 2016-07-01 ENCOUNTER — Encounter (HOSPITAL_COMMUNITY): Payer: PPO

## 2016-07-04 ENCOUNTER — Encounter (HOSPITAL_COMMUNITY): Payer: PPO

## 2016-07-06 ENCOUNTER — Encounter (HOSPITAL_COMMUNITY): Payer: PPO

## 2016-07-08 ENCOUNTER — Encounter (HOSPITAL_COMMUNITY): Payer: PPO

## 2016-07-10 NOTE — Progress Notes (Signed)
HPI The patient presents for evaluation of known coronary artery disease. He had a cardiac catheterization in 2014 demonstrating left main 60% stenosis. The LAD had a proximal 60-70% stenosis. There was mid 40% stenosis. Diagonal is moderate size with 40% stenosis. The circumflex system included a large OM with 60% ostial stenosis the RCA was occluded before the PDA. The EF was 65%. However, we decided to manage him medically unless he had worsening symptoms. When I saw him last in May I sent him for a screening POET (Plain Old Exercise Treadmill).  This was abnormal with ST depression and was a high risk study. He then underwent cardiac catheterization where he was found to have a 60% ostial left main, a 90% proximal LAD, 90% proximal circumflex, and a diffusely diseased right coronary with a chronically totally occluded posterior descending.  He was taken the operating room on 02/25/2016 for CABG x 4 by Dr. Roxan Hockey.     The patient denies any new symptoms such as chest discomfort, neck or arm discomfort. There has been no new shortness of breath, PND or orthopnea. There have been no reported palpitations, presyncope or syncope.  He is exercising and feels very well.   No Known Allergies  Current Outpatient Prescriptions  Medication Sig Dispense Refill  . amLODipine (NORVASC) 5 MG tablet Take 5 mg by mouth daily.      Marland Kitchen aspirin 81 MG tablet Take 81 mg by mouth daily.      . clopidogrel (PLAVIX) 75 MG tablet Take 75 mg by mouth daily.      . Coenzyme Q10 (CO Q 10) 100 MG CAPS Take 100 mg by mouth daily.     . CRESTOR 10 MG tablet Take 10 mg by mouth every other day.     . doxazosin (CARDURA) 4 MG tablet Take 4 mg by mouth at bedtime.      Marland Kitchen ezetimibe (ZETIA) 10 MG tablet Take 10 mg by mouth daily.    . finasteride (PROSCAR) 5 MG tablet Take 5 mg by mouth daily.      . fish oil-omega-3 fatty acids 1000 MG capsule Take 1 g by mouth daily.      Marland Kitchen gabapentin (NEURONTIN) 300 MG capsule Take 300  mg by mouth at bedtime.    Marland Kitchen GLUCOSAMINE-CHONDROITIN PO Take 2 tablets by mouth daily.     Marland Kitchen latanoprost (XALATAN) 0.005 % ophthalmic solution Place 1 drop into both eyes daily.    Marland Kitchen losartan (COZAAR) 100 MG tablet Take 100 mg by mouth daily.      . metoprolol tartrate (LOPRESSOR) 25 MG tablet Take 1 tablet (25 mg total) by mouth 2 (two) times daily. 60 tablet 1  . Multiple Vitamins-Minerals (MULTIVITAMIN WITH MINERALS) tablet Take 1 tablet by mouth daily.    . Plant Sterols and Stanols (CHOLEST OFF) 450 MG TABS Take 900 mg by mouth daily.     . traMADol (ULTRAM) 50 MG tablet Take 50 mg by mouth as needed.     No current facility-administered medications for this visit.     Past Medical History:  Diagnosis Date  . Arthritis   . BPH (benign prostatic hyperplasia)   . Bunion   . Callus   . Carotid artery occlusion   . Coronary artery disease    left main 60% stenosis. The LAD had a proximal 60-70% stenosis. There was mid 40% stenosis. Diagonal is moderate size with 40% stenosis. The circumflex system included a large OM with 60% ostial stenosis  the RCA was occluded before the PDA. The EF was 65%.  2014  . Hyperlipidemia   . Hypertension   . 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 Obesity   . Radiculopathy   . Shortness of breath dyspnea    with exertion -     Past Surgical History:  Procedure Laterality Date  . APPENDECTOMY  1958  . CARDIAC CATHETERIZATION  05/2004   SHOWING 100% OCCLUDED DISTAL RIGHT CORONARY WITH  LEFT  TO RIGHT COLLATERALS, AS WELL AS ANTEGRADE FLOW, NORMAL LEFT MAIN, 50% NARROWING IN LEFT CIRCUMFLEX  WITH IRREGULARITIES IN THE LAD  . CARDIAC CATHETERIZATION N/A 02/12/2016   Procedure: Left Heart Cath and Coronary Angiography;  Surgeon: Leonie Man, MD;  Location: [facility];  Service: Cardiovascular;  Laterality: N/A;  . CARDIOVASCULAR STRESS TEST  02/26/2010   EF 65%, SMALL AREA OF MILD INFARCT AND POSSIBLE PER-INFARCT ISCHEMIA IN THE  INFEROBASAL WALL  . COLONOSCOPY    . CORONARY ANGIOPLASTY     RIGHT CORONARY WERE UNSUCCESSFUL  . CORONARY ARTERY BYPASS GRAFT N/A 02/25/2016   Procedure: CORONARY ARTERY BYPASS GRAFTING (CABG) times four using the left internal mammary artery and the right saphenus vein ;  Surgeon: [name], MD;  Location: [facility];  Service: Open Heart Surgery;  Laterality: N/A;  . HERNIA REPAIR  1960   right hernia repair  . INTRAOPERATIVE TRANSESOPHAGEAL ECHOCARDIOGRAM N/A 02/25/2016   Procedure: INTRAOPERATIVE TRANSESOPHAGEAL ECHOCARDIOGRAM;  Surgeon: [name], MD;  Location: [facility];  Service: Open Heart Surgery;  Laterality: N/A;  . UMBILICAL HERNIA REPAIR    . US ECHOCARDIOGRAPHY  01/03/2007   EF 55-60%    ROS:   As stated in the HPI and negative for all other systems.  PHYSICAL EXAM BP 110/66 (BP Location: Right Arm, Patient Position: Sitting, Cuff Size: Normal)   Ht 5' 2.5" (1.588 m)   Wt 195 lb 6.4 oz (88.6 kg)   SpO2 98%   BMI 35.17 kg/m  GENERAL:  Well appearing NECK:  No jugular venous distention, waveform within normal limits, carotid upstroke brisk and symmetric, left bruits, no thyromegaly CHEST:  Well healed sternotomy scar. LUNGS:  Clear to auscultation bilaterally CHEST:  Unremarkable HEART:  PMI not displaced or sustained,S1 and S2 within normal limits, no S3, no S4, no clicks, no rubs, no murmurs ABD:  Flat, positive bowel sounds normal in frequency in pitch, no bruits, no rebound, no guarding, no midline pulsatile mass, no hepatomegaly, no splenomegaly EXT:  2 plus pulses throughout, no edema, no cyanosis no clubbing   ASSESSMENT AND PLAN  CAD s/p CABG:  He is doing very well.  No change in therapy.  He will continue with risk reduction. Marland Kitchen  HTN:  The blood pressure is at target. Despite the recent fluctuations at this time I do not think that any change in medications is indicated. We will continue with therapeutic lifestyle changes (TLC).  HYPERLIPIDEMIA:   His LDL was 82 and HDL 48 in Aug.  He will continue meds as listed.   CAROTID STENOSIS:   He had moderate carotid stenosis in June and I will follow up in June of next year.  He asked question and I don't see an indication for him to continue the Plavix.

## 2016-07-11 ENCOUNTER — Ambulatory Visit (INDEPENDENT_AMBULATORY_CARE_PROVIDER_SITE_OTHER): Payer: PPO | Admitting: Cardiology

## 2016-07-11 ENCOUNTER — Encounter: Payer: Self-pay | Admitting: Cardiology

## 2016-07-11 ENCOUNTER — Encounter (HOSPITAL_COMMUNITY): Payer: PPO

## 2016-07-11 VITALS — BP 110/66 | Ht 62.5 in | Wt 195.4 lb

## 2016-07-11 DIAGNOSIS — I6529 Occlusion and stenosis of unspecified carotid artery: Secondary | ICD-10-CM

## 2016-07-11 DIAGNOSIS — Z951 Presence of aortocoronary bypass graft: Secondary | ICD-10-CM | POA: Diagnosis not present

## 2016-07-11 DIAGNOSIS — I10 Essential (primary) hypertension: Secondary | ICD-10-CM | POA: Diagnosis not present

## 2016-07-11 NOTE — Patient Instructions (Signed)
Medication Instructions:  STOP- Plavix  Labwork: None Ordered  Testing/Procedures: None Ordered  Follow-Up: Your physician wants you to follow-up in: 6 Months. You will receive a reminder letter in the mail two months in advance. If you don't receive a letter, please call our office to schedule the follow-up appointment.   Any Other Special Instructions Will Be Listed Below (If Applicable).         Happy Thanksgiving   If you need a refill on your cardiac medications before your next appointment, please call your pharmacy.

## 2016-07-13 ENCOUNTER — Encounter (HOSPITAL_COMMUNITY): Payer: PPO

## 2016-07-13 ENCOUNTER — Encounter: Payer: Self-pay | Admitting: Cardiology

## 2016-07-15 ENCOUNTER — Encounter (HOSPITAL_COMMUNITY): Payer: PPO

## 2016-07-17 ENCOUNTER — Other Ambulatory Visit: Payer: Self-pay | Admitting: Physician Assistant

## 2016-07-18 ENCOUNTER — Encounter (HOSPITAL_COMMUNITY): Payer: PPO

## 2016-07-20 ENCOUNTER — Encounter (HOSPITAL_COMMUNITY): Payer: PPO

## 2016-07-20 ENCOUNTER — Other Ambulatory Visit: Payer: Self-pay | Admitting: Surgical

## 2016-07-22 ENCOUNTER — Encounter (HOSPITAL_COMMUNITY): Payer: PPO

## 2016-07-25 ENCOUNTER — Encounter (HOSPITAL_COMMUNITY): Payer: PPO

## 2016-07-25 ENCOUNTER — Telehealth: Payer: Self-pay | Admitting: Cardiology

## 2016-07-25 MED ORDER — METOPROLOL TARTRATE 25 MG PO TABS: 25.0000 mg | ORAL_TABLET | Freq: Two times a day (BID) | ORAL | 1 refills | Status: DC

## 2016-07-25 NOTE — Telephone Encounter (Signed)
New message   Pt verbalized that his surgeon wrote him an an prescription for Metoprolol and he stayed that he has sent messages to ask if it can be refilled by doctor and he has not heard anything back

## 2016-07-25 NOTE — Telephone Encounter (Signed)
Dr. Percival Spanish is original prescriber for metoprolol - last refill was handled by surgeon's office at a post-hosp visit. Pt needing Dr. Percival Spanish to refill. Pt was seen 2 weeks ago w recommendation for 6 mo f/u, no changes to dosing of this medication. I refilled for 6 months at 90 day supply at patient request to his preferred pharmacy, patient aware and voiced thanks.

## 2016-07-27 ENCOUNTER — Encounter (HOSPITAL_COMMUNITY): Payer: PPO

## 2016-07-29 ENCOUNTER — Encounter (HOSPITAL_COMMUNITY): Payer: PPO

## 2016-08-01 ENCOUNTER — Encounter (HOSPITAL_COMMUNITY): Payer: PPO

## 2016-08-03 ENCOUNTER — Encounter (HOSPITAL_COMMUNITY): Payer: PPO

## 2016-08-05 ENCOUNTER — Encounter (HOSPITAL_COMMUNITY): Payer: PPO

## 2016-08-08 ENCOUNTER — Encounter (HOSPITAL_COMMUNITY): Payer: PPO

## 2016-08-08 DIAGNOSIS — N400 Enlarged prostate without lower urinary tract symptoms: Secondary | ICD-10-CM | POA: Diagnosis not present

## 2016-08-10 ENCOUNTER — Encounter (HOSPITAL_COMMUNITY): Payer: PPO

## 2016-08-10 DIAGNOSIS — N529 Male erectile dysfunction, unspecified: Secondary | ICD-10-CM | POA: Diagnosis not present

## 2016-08-10 DIAGNOSIS — N138 Other obstructive and reflux uropathy: Secondary | ICD-10-CM | POA: Diagnosis not present

## 2016-08-10 DIAGNOSIS — N401 Enlarged prostate with lower urinary tract symptoms: Secondary | ICD-10-CM | POA: Diagnosis not present

## 2016-09-09 DIAGNOSIS — E7800 Pure hypercholesterolemia, unspecified: Secondary | ICD-10-CM | POA: Diagnosis not present

## 2016-09-09 DIAGNOSIS — I10 Essential (primary) hypertension: Secondary | ICD-10-CM | POA: Diagnosis not present

## 2016-09-16 DIAGNOSIS — E784 Other hyperlipidemia: Secondary | ICD-10-CM | POA: Diagnosis not present

## 2016-09-16 DIAGNOSIS — I10 Essential (primary) hypertension: Secondary | ICD-10-CM | POA: Diagnosis not present

## 2016-09-16 DIAGNOSIS — R7303 Prediabetes: Secondary | ICD-10-CM | POA: Diagnosis not present

## 2016-09-16 DIAGNOSIS — Z79899 Other long term (current) drug therapy: Secondary | ICD-10-CM | POA: Diagnosis not present

## 2016-09-16 DIAGNOSIS — M791 Myalgia: Secondary | ICD-10-CM | POA: Diagnosis not present

## 2016-11-23 DIAGNOSIS — H2513 Age-related nuclear cataract, bilateral: Secondary | ICD-10-CM | POA: Diagnosis not present

## 2016-11-23 DIAGNOSIS — H25013 Cortical age-related cataract, bilateral: Secondary | ICD-10-CM | POA: Diagnosis not present

## 2016-11-23 DIAGNOSIS — H401131 Primary open-angle glaucoma, bilateral, mild stage: Secondary | ICD-10-CM | POA: Diagnosis not present

## 2017-01-16 DIAGNOSIS — I2510 Atherosclerotic heart disease of native coronary artery without angina pectoris: Secondary | ICD-10-CM | POA: Diagnosis not present

## 2017-01-16 DIAGNOSIS — Z86010 Personal history of colonic polyps: Secondary | ICD-10-CM | POA: Diagnosis not present

## 2017-01-20 ENCOUNTER — Other Ambulatory Visit: Payer: Self-pay | Admitting: Cardiology

## 2017-01-20 NOTE — Telephone Encounter (Signed)
Rx has been sent to the pharmacy electronically. ° °

## 2017-03-06 DIAGNOSIS — Q828 Other specified congenital malformations of skin: Secondary | ICD-10-CM | POA: Diagnosis not present

## 2017-03-06 DIAGNOSIS — R7303 Prediabetes: Secondary | ICD-10-CM | POA: Diagnosis not present

## 2017-03-08 DIAGNOSIS — I10 Essential (primary) hypertension: Secondary | ICD-10-CM | POA: Diagnosis not present

## 2017-03-08 DIAGNOSIS — Z79899 Other long term (current) drug therapy: Secondary | ICD-10-CM | POA: Diagnosis not present

## 2017-03-08 DIAGNOSIS — J029 Acute pharyngitis, unspecified: Secondary | ICD-10-CM | POA: Diagnosis not present

## 2017-03-08 DIAGNOSIS — E784 Other hyperlipidemia: Secondary | ICD-10-CM | POA: Diagnosis not present

## 2017-03-08 DIAGNOSIS — R7303 Prediabetes: Secondary | ICD-10-CM | POA: Diagnosis not present

## 2017-03-08 DIAGNOSIS — M26609 Unspecified temporomandibular joint disorder, unspecified side: Secondary | ICD-10-CM | POA: Diagnosis not present

## 2017-04-23 NOTE — Progress Notes (Signed)
HPI The patiill milligrams ardiac catheterization in 2014 demonstrating left main 60% stenosis. The LAD had a proximal 60-70% stenosis. There was mid 40% stenosis. Diagonal is moderate size with 40% stenosis. The circumflex system included a large OM with 60% ostial stenosis the RCA was occluded before the PDA. The EF was 65%. However, we decided to manage him medically unless he had worsening symptoms. When I saw him last in May I sent him for a screening POET (Plain Old Exercise Treadmill).  This was abnormal with ST depression and was a high risk study. He then underwent cardiac catheterization where he was found to have a 60% ostial left main, a 90% proximal LAD, 90% proximal circumflex, and a diffusely diseased right coronary with a chronically totally occluded posterior descending.  He was taken the operating room on 02/25/2016 for CABG x 4 by Dr. Roxan Hockey.     He returns for follow up.  The patient denies any new symptoms such as chest discomfort, neck or arm discomfort. There has been no new shortness of breath, PND or orthopnea. There have been no reported palpitations, presyncope or syncope.  He is exercising in the pool and will join the gym.  He did some cardiac rehab.    No Known Allergies  Current Outpatient Prescriptions  Medication Sig Dispense Refill  . amLODipine (NORVASC) 5 MG tablet Take 5 mg by mouth daily.      Marland Kitchen aspirin 81 MG tablet Take 81 mg by mouth daily.      . Coenzyme Q10 (CO Q 10) 100 MG CAPS Take 100 mg by mouth daily.     . CRESTOR 10 MG tablet Take 10 mg by mouth every other day.     . doxazosin (CARDURA) 4 MG tablet Take 4 mg by mouth at bedtime.      Marland Kitchen ezetimibe (ZETIA) 10 MG tablet Take 10 mg by mouth daily.    . finasteride (PROSCAR) 5 MG tablet Take 5 mg by mouth daily.      . fish oil-omega-3 fatty acids 1000 MG capsule Take 1 g by mouth daily.      Marland Kitchen gabapentin (NEURONTIN) 300 MG capsule Take 300 mg by mouth at bedtime.    Marland Kitchen GLUCOSAMINE-CHONDROITIN  PO Take 2 tablets by mouth daily.     Marland Kitchen latanoprost (XALATAN) 0.005 % ophthalmic solution Place 1 drop into both eyes daily.    Marland Kitchen losartan (COZAAR) 100 MG tablet Take 100 mg by mouth daily.      . metoprolol tartrate (LOPRESSOR) 25 MG tablet TAKE ONE TABLET BY MOUTH TWICE A DAY 180 tablet 1  . Multiple Vitamins-Minerals (MULTIVITAMIN WITH MINERALS) tablet Take 1 tablet by mouth daily.    . Plant Sterols and Stanols (CHOLEST OFF) 450 MG TABS Take 900 mg by mouth daily.     . traMADol (ULTRAM) 50 MG tablet Take 50 mg by mouth as needed.     No current facility-administered medications for this visit.     Past Medical History:  Diagnosis Date  . Arthritis   . BPH (benign prostatic hyperplasia)   . Bunion   . Callus   . Carotid artery occlusion   . Coronary artery disease    left main 60% stenosis. The LAD had a proximal 60-70% stenosis. There was mid 40% stenosis. Diagonal is moderate size with 40% stenosis. The circumflex system included a large OM with 60% ostial stenosis the RCA was occluded before the PDA. The EF was 65%.  2014  .  Hyperlipidemia   . Hypertension   . Myocardial infarct ([place]) 1992   inferior posterior  . Obesity   . Radiculopathy   . Shortness of breath dyspnea    with exertion -     Past Surgical History:  Procedure Laterality Date  . APPENDECTOMY  1958  . CARDIAC CATHETERIZATION  05/2004   SHOWING 100% OCCLUDED DISTAL RIGHT CORONARY WITH  LEFT  TO RIGHT COLLATERALS, AS WELL AS ANTEGRADE FLOW, NORMAL LEFT MAIN, 50% NARROWING IN LEFT CIRCUMFLEX  WITH IRREGULARITIES IN THE LAD  . CARDIAC CATHETERIZATION N/A 02/12/2016   Procedure: Left Heart Cath and Coronary Angiography;  Surgeon: Leonie Man, MD;  Location: [facility];  Service: Cardiovascular;  Laterality: N/A;  . CARDIOVASCULAR STRESS TEST  02/26/2010   EF 65%, SMALL AREA OF MILD INFARCT AND POSSIBLE PER-INFARCT ISCHEMIA IN THE INFEROBASAL WALL  . COLONOSCOPY    . CORONARY ANGIOPLASTY     RIGHT  CORONARY WERE UNSUCCESSFUL  . CORONARY ARTERY BYPASS GRAFT N/A 02/25/2016   Procedure: CORONARY ARTERY BYPASS GRAFTING (CABG) times four using the left internal mammary artery and the right saphenus vein ;  Surgeon: [name], MD;  Location: [facility];  Service: Open Heart Surgery;  Laterality: N/A;  . HERNIA REPAIR  1960   right hernia repair  . INTRAOPERATIVE TRANSESOPHAGEAL ECHOCARDIOGRAM N/A 02/25/2016   Procedure: INTRAOPERATIVE TRANSESOPHAGEAL ECHOCARDIOGRAM;  Surgeon: [name], MD;  Location: [facility];  Service: Open Heart Surgery;  Laterality: N/A;  . UMBILICAL HERNIA REPAIR    . US ECHOCARDIOGRAPHY  01/03/2007   EF 55-60%    ROS:   As stated in the HPI and negative for all other systems.  PHYSICAL EXAM BP 127/74   Pulse (!) 52   Ht 5' 2.5" (1.588 m)   Wt 188 lb 12.8 oz (85.6 kg)   BMI 33.98 kg/m   GENERAL:  Well appearing NECK:  No jugular venous distention, waveform within normal limits, carotid upstroke brisk and symmetric, no bruits, no thyromegaly LUNGS:  Clear to auscultation bilaterally CHEST:  Well healed sternotomy scar. HEART:  PMI not displaced or sustained,S1 and S2 within normal limits, no S3, no S4, no clicks, no rubs, no murmurs ABD:  Flat, positive bowel sounds normal in frequency in pitch, no bruits, no rebound, no guarding, no midline pulsatile mass, no hepatomegaly, no splenomegaly EXT:  2 plus pulses throughout, no edema, no cyanosis no clubbing  EKG:  Sinus rhythm, rate 52, first-degree AV block, Mobitz type I, no acute ST-T wave changes.   ASSESSMENT AND PLAN  CAD s/p CABG:  He is doing well.  The patient denies any new symptoms such as chest discomfort, neck or arm discomfort. There has been no new shortness of breath, PND or orthopnea. There have been no reported palpitations, presyncope or syncope.  HTN:  The blood pressure is at target. No change in medications is indicated. We will continue with therapeutic lifestyle changes  (TLC).  HYPERLIPIDEMIA:   His LDL was 59.  No change in therapy.    CAROTID STENOSIS:   He had moderate carotid stenosis in June and I will follow up in June of 2019 .  BRADYCARDIA:  He has no symptoms related to this.  No change in therapy is suggested.   He will let me know if he has any light headedness or presyncope in the future.

## 2017-04-24 ENCOUNTER — Encounter: Payer: Self-pay | Admitting: Cardiology

## 2017-04-24 ENCOUNTER — Ambulatory Visit (INDEPENDENT_AMBULATORY_CARE_PROVIDER_SITE_OTHER): Payer: PPO | Admitting: Cardiology

## 2017-04-24 VITALS — BP 127/74 | HR 52 | Ht 62.5 in | Wt 188.8 lb

## 2017-04-24 DIAGNOSIS — I10 Essential (primary) hypertension: Secondary | ICD-10-CM | POA: Diagnosis not present

## 2017-04-24 DIAGNOSIS — E785 Hyperlipidemia, unspecified: Secondary | ICD-10-CM | POA: Diagnosis not present

## 2017-04-24 DIAGNOSIS — I2510 Atherosclerotic heart disease of native coronary artery without angina pectoris: Secondary | ICD-10-CM | POA: Diagnosis not present

## 2017-04-24 DIAGNOSIS — I6529 Occlusion and stenosis of unspecified carotid artery: Secondary | ICD-10-CM

## 2017-04-24 NOTE — Patient Instructions (Signed)
Medication Instructions:  Continue current medications  If you need a refill on your cardiac medications before your next appointment, please call your pharmacy.  Labwork: None Ordered   Testing/Procedures: None Ordered  Follow-Up: Your physician wants you to follow-up in: 1 Year. You should receive a reminder letter in the mail two months in advance. If you do not receive a letter, please call our office [phone].    Thank you for choosing [facility]!!      

## 2017-05-04 ENCOUNTER — Encounter: Payer: Self-pay | Admitting: Family Medicine

## 2017-05-04 DIAGNOSIS — D126 Benign neoplasm of colon, unspecified: Secondary | ICD-10-CM | POA: Diagnosis not present

## 2017-05-04 DIAGNOSIS — Z86010 Personal history of colonic polyps: Secondary | ICD-10-CM | POA: Diagnosis not present

## 2017-05-08 DIAGNOSIS — D126 Benign neoplasm of colon, unspecified: Secondary | ICD-10-CM | POA: Diagnosis not present

## 2017-06-09 ENCOUNTER — Other Ambulatory Visit: Payer: Self-pay | Admitting: *Deleted

## 2017-06-09 ENCOUNTER — Telehealth: Payer: Self-pay | Admitting: Cardiology

## 2017-06-09 MED ORDER — METOPROLOL TARTRATE 25 MG PO TABS: 25.0000 mg | ORAL_TABLET | Freq: Two times a day (BID) | ORAL | 3 refills | Status: DC

## 2017-06-09 NOTE — Telephone Encounter (Signed)
°*  STAT* If patient is at the pharmacy, call can be transferred to refill team.   1. Which medications need to be refilled? (please list name of each medication and dose if known) Metoprolol    2. Which pharmacy/location (including street and city if local pharmacy) is medication to be sent to? [facility] , [place] [phone]  3. Do they need a 30 day or 90 day supply?90  Patient is out of town

## 2017-07-27 DIAGNOSIS — N401 Enlarged prostate with lower urinary tract symptoms: Secondary | ICD-10-CM | POA: Diagnosis not present

## 2017-07-27 DIAGNOSIS — N138 Other obstructive and reflux uropathy: Secondary | ICD-10-CM | POA: Diagnosis not present

## 2017-07-31 NOTE — Progress Notes (Signed)
HPI The patient presents for follow up of CAD.  He had a cardiac cath in 2014 demonstrating left main 60% stenosis. The LAD had a proximal 60-70% stenosis. There was mid 40% stenosis. Diagonal is moderate size with 40% stenosis. The circumflex system included a large OM with 60% ostial stenosis the RCA was occluded before the PDA. The EF was 65%. However, we decided to manage him medically unless he had worsening symptoms. When I saw him last in May I sent him for a screening POET (Plain Old Exercise Treadmill).  This was abnormal with ST depression and was a high risk study. He then underwent cardiac catheterization where he was found to have a 60% ostial left main, a 90% proximal LAD, 90% proximal circumflex, and a diffusely diseased right coronary with a chronically totally occluded posterior descending.  He was taken the operating room on 02/25/2016 for CABG x 4 by Dr. Roxan Hockey.     He called to be added to the schedule because he was having some chest pain.  He says that this has been sporadic.  He has not had this before.  It is left upper chest.  It is very mild and feels "like a pulled muscle."  It comes and goes and could last for an hour but it is not severe at all.  He just wanted to have it checked out.  There are no associated symptoms.  There is no SOB, N/V, diaphoresis.  He cannot bring this on.  He was on the elliptical for forty minutes today without symptoms.  The patient denies any new symptoms such as neck or arm discomfort. There has been no new shortness of breath, PND or orthopnea. There have been no reported palpitations, presyncope or syncope.    No Known Allergies  Current Outpatient Medications  Medication Sig Dispense Refill  . amLODipine (NORVASC) 5 MG tablet Take 5 mg by mouth daily.      Marland Kitchen aspirin 81 MG tablet Take 81 mg by mouth daily.      . Coenzyme Q10 (CO Q 10) 100 MG CAPS Take 100 mg by mouth daily.     . CRESTOR 10 MG tablet Take 10 mg by mouth every other  day.     . doxazosin (CARDURA) 4 MG tablet Take 4 mg by mouth at bedtime.      Marland Kitchen ezetimibe (ZETIA) 10 MG tablet Take 10 mg by mouth daily.    . finasteride (PROSCAR) 5 MG tablet Take 5 mg by mouth daily.      . fish oil-omega-3 fatty acids 1000 MG capsule Take 1 g by mouth daily.      Marland Kitchen gabapentin (NEURONTIN) 300 MG capsule Take 300 mg by mouth at bedtime.    Marland Kitchen GLUCOSAMINE-CHONDROITIN PO Take 2 tablets by mouth daily.     Marland Kitchen latanoprost (XALATAN) 0.005 % ophthalmic solution Place 1 drop into both eyes daily.    Marland Kitchen losartan (COZAAR) 100 MG tablet Take 100 mg by mouth daily.      . metoprolol tartrate (LOPRESSOR) 25 MG tablet Take 1 tablet (25 mg total) by mouth 2 (two) times daily. 180 tablet 3  . Multiple Vitamins-Minerals (MULTIVITAMIN WITH MINERALS) tablet Take 1 tablet by mouth daily.    . Plant Sterols and Stanols (CHOLEST OFF) 450 MG TABS Take 900 mg by mouth daily.     . traMADol (ULTRAM) 50 MG tablet Take 50 mg by mouth as needed.     No current facility-administered medications for  this visit.     Past Medical History:  Diagnosis Date  . Arthritis   . BPH (benign prostatic hyperplasia)   . Bunion   . Callus   . Carotid artery occlusion   . Coronary artery disease    left main 60% stenosis. The LAD had a proximal 60-70% stenosis. There was mid 40% stenosis. Diagonal is moderate size with 40% stenosis. The circumflex system included a large OM with 60% ostial stenosis the RCA was occluded before the PDA. The EF was 65%.  2014  . Hyperlipidemia   . Hypertension   . Myocardial infarct ([place]) 1992   inferior posterior  . Obesity   . Radiculopathy   . Shortness of breath dyspnea    with exertion -     Past Surgical History:  Procedure Laterality Date  . APPENDECTOMY  1958  . CARDIAC CATHETERIZATION  05/2004   SHOWING 100% OCCLUDED DISTAL RIGHT CORONARY WITH  LEFT  TO RIGHT COLLATERALS, AS WELL AS ANTEGRADE FLOW, NORMAL LEFT MAIN, 50% NARROWING IN LEFT CIRCUMFLEX  WITH  IRREGULARITIES IN THE LAD  . CARDIAC CATHETERIZATION N/A 02/12/2016   Procedure: Left Heart Cath and Coronary Angiography;  Surgeon: Leonie Man, MD;  Location: [facility];  Service: Cardiovascular;  Laterality: N/A;  . CARDIOVASCULAR STRESS TEST  02/26/2010   EF 65%, SMALL AREA OF MILD INFARCT AND POSSIBLE PER-INFARCT ISCHEMIA IN THE INFEROBASAL WALL  . COLONOSCOPY    . CORONARY ANGIOPLASTY     RIGHT CORONARY WERE UNSUCCESSFUL  . CORONARY ARTERY BYPASS GRAFT N/A 02/25/2016   Procedure: CORONARY ARTERY BYPASS GRAFTING (CABG) times four using the left internal mammary artery and the right saphenus vein ;  Surgeon: [name], MD;  Location: [place];  Service: Open Heart Surgery;  Laterality: N/A;  . HERNIA REPAIR  1960   right hernia repair  . INTRAOPERATIVE TRANSESOPHAGEAL ECHOCARDIOGRAM N/A 02/25/2016   Procedure: INTRAOPERATIVE TRANSESOPHAGEAL ECHOCARDIOGRAM;  Surgeon: [name], MD;  Location: [place];  Service: Open Heart Surgery;  Laterality: N/A;  . UMBILICAL HERNIA REPAIR    . US ECHOCARDIOGRAPHY  01/03/2007   EF 55-60%    ROS:   As stated in the HPI and negative for all other systems.  PHYSICAL EXAM BP 116/66   Pulse 64   Ht 5' 2.5" (1.588 m)   Wt 192 lb (87.1 kg)   SpO2 96%   BMI 34.56 kg/m   GENERAL:  Well appearing NECK:  No jugular venous distention, waveform within normal limits, carotid upstroke brisk and symmetric, no bruits, no thyromegaly LUNGS:  Clear to auscultation bilaterally CHEST:  Well healed sternotomy scar. HEART:  PMI not displaced or sustained,S1 and S2 within normal limits, no S3, no S4, no clicks, no rubs, no murmurs ABD:  Flat, positive bowel sounds normal in frequency in pitch, no bruits, no rebound, no guarding, no midline pulsatile mass, no hepatomegaly, no splenomegaly EXT:  2 plus pulses throughout, no edema, no cyanosis no clubbing    EKG:  Sinus rhythm, rate 62 , first-degree AV block, no acute ST-T wave changes.   08/01/2017  ASSESSMENT AND PLAN  CAD s/p CABG:    At this point I don't have any strong suspicion that his atypical pain is unstable angina.  I think that he should continue with his current risk reduction.  He can let me know if this gets worse she is 1 in 6 months  HTN:  The blood pressure is at target. No change  in therapy.   HYPERLIPIDEMIA:   His LDL was 34 in Jan and he will continue the same meds.   CAROTID STENOSIS:   This was moderate and will be repeated June 2019.   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   No change in therapy.

## 2017-08-01 ENCOUNTER — Ambulatory Visit: Payer: PPO | Admitting: Cardiology

## 2017-08-01 ENCOUNTER — Encounter: Payer: Self-pay | Admitting: Cardiology

## 2017-08-01 VITALS — BP 116/66 | HR 64 | Ht 62.5 in | Wt 192.0 lb

## 2017-08-01 DIAGNOSIS — I2510 Atherosclerotic heart disease of native coronary artery without angina pectoris: Secondary | ICD-10-CM | POA: Diagnosis not present

## 2017-08-01 DIAGNOSIS — R079 Chest pain, unspecified: Secondary | ICD-10-CM

## 2017-08-01 DIAGNOSIS — E785 Hyperlipidemia, unspecified: Secondary | ICD-10-CM | POA: Diagnosis not present

## 2017-08-01 NOTE — Patient Instructions (Signed)

## 2017-08-02 DIAGNOSIS — N138 Other obstructive and reflux uropathy: Secondary | ICD-10-CM | POA: Diagnosis not present

## 2017-08-02 DIAGNOSIS — N401 Enlarged prostate with lower urinary tract symptoms: Secondary | ICD-10-CM | POA: Diagnosis not present

## 2017-08-02 DIAGNOSIS — N3941 Urge incontinence: Secondary | ICD-10-CM | POA: Diagnosis not present

## 2017-08-02 NOTE — Addendum Note (Signed)
Addended by: Vennie Homans on: 08/02/2017 02:11 PM   Modules accepted: Orders

## 2017-09-18 DIAGNOSIS — H903 Sensorineural hearing loss, bilateral: Secondary | ICD-10-CM | POA: Diagnosis not present

## 2017-10-02 DIAGNOSIS — M792 Neuralgia and neuritis, unspecified: Secondary | ICD-10-CM | POA: Diagnosis not present

## 2017-10-02 DIAGNOSIS — M21619 Bunion of unspecified foot: Secondary | ICD-10-CM | POA: Diagnosis not present

## 2017-10-06 DIAGNOSIS — I10 Essential (primary) hypertension: Secondary | ICD-10-CM | POA: Diagnosis not present

## 2017-10-06 DIAGNOSIS — R7303 Prediabetes: Secondary | ICD-10-CM | POA: Diagnosis not present

## 2017-10-06 DIAGNOSIS — E7849 Other hyperlipidemia: Secondary | ICD-10-CM | POA: Diagnosis not present

## 2017-10-10 DIAGNOSIS — E785 Hyperlipidemia, unspecified: Secondary | ICD-10-CM | POA: Diagnosis not present

## 2017-10-10 DIAGNOSIS — I10 Essential (primary) hypertension: Secondary | ICD-10-CM | POA: Diagnosis not present

## 2017-10-10 DIAGNOSIS — I25810 Atherosclerosis of coronary artery bypass graft(s) without angina pectoris: Secondary | ICD-10-CM | POA: Diagnosis not present

## 2017-10-11 ENCOUNTER — Other Ambulatory Visit: Payer: Self-pay

## 2017-10-29 IMAGING — CR DG CHEST 1V PORT
1 series · 1 of 1 positions shown · non-contrast
Comparison: February 23, 2016

CLINICAL DATA: Hypoxia. Status post coronary artery bypass grafting

EXAM:
PORTABLE CHEST 1 VIEW

[AP]
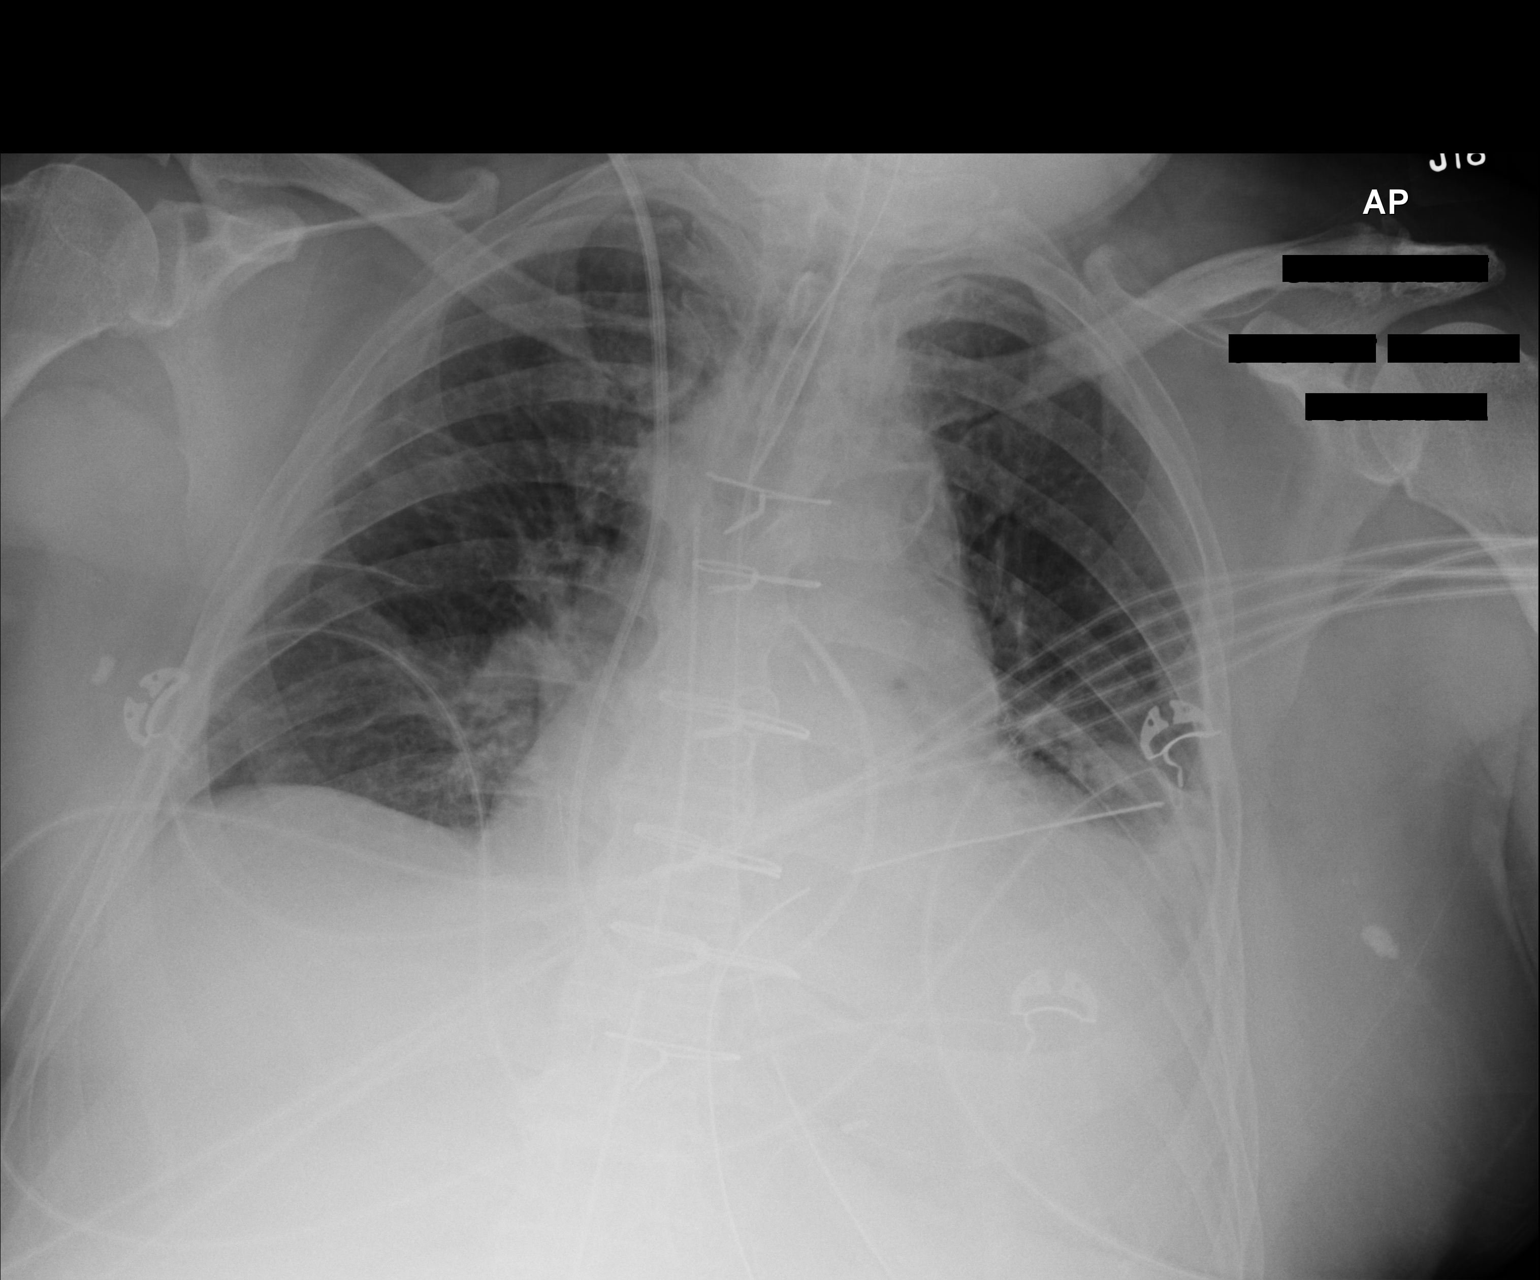

[1 of 1 positions shown; findings below may reference images not displayed]

FINDINGS: Endotracheal tube tip is 2.3 cm above the carina. Swan-Ganz catheter
tip is in the proximal right main pulmonary artery. Nasogastric tube
tip and side port are in the stomach. There is a left chest tube and
a mediastinal drain. Temporary pacemaker wires are attached to the
right heart. Patient is status post coronary artery bypass grafting.
No pneumothorax. There is consolidation with small effusion in the
left base. The right lung is clear. Heart is mildly enlarged with
pulmonary vascularity within normal limits. There is atherosclerotic
calcification in the aorta. No adenopathy evident.
IMPRESSION: Tube and catheter positions as described without pneumothorax. Left
base consolidation with small left pleural effusion. Right lung
clear. There is mild cardiomegaly. There is aortic atherosclerosis.

## 2017-10-30 IMAGING — CR DG CHEST 1V PORT
1 series · 1 of 1 positions shown · non-contrast
Comparison: Portable chest x-ray February 25, 2016

CLINICAL DATA: Status post CABG yesterday

EXAM:
PORTABLE CHEST 1 VIEW

[AP]
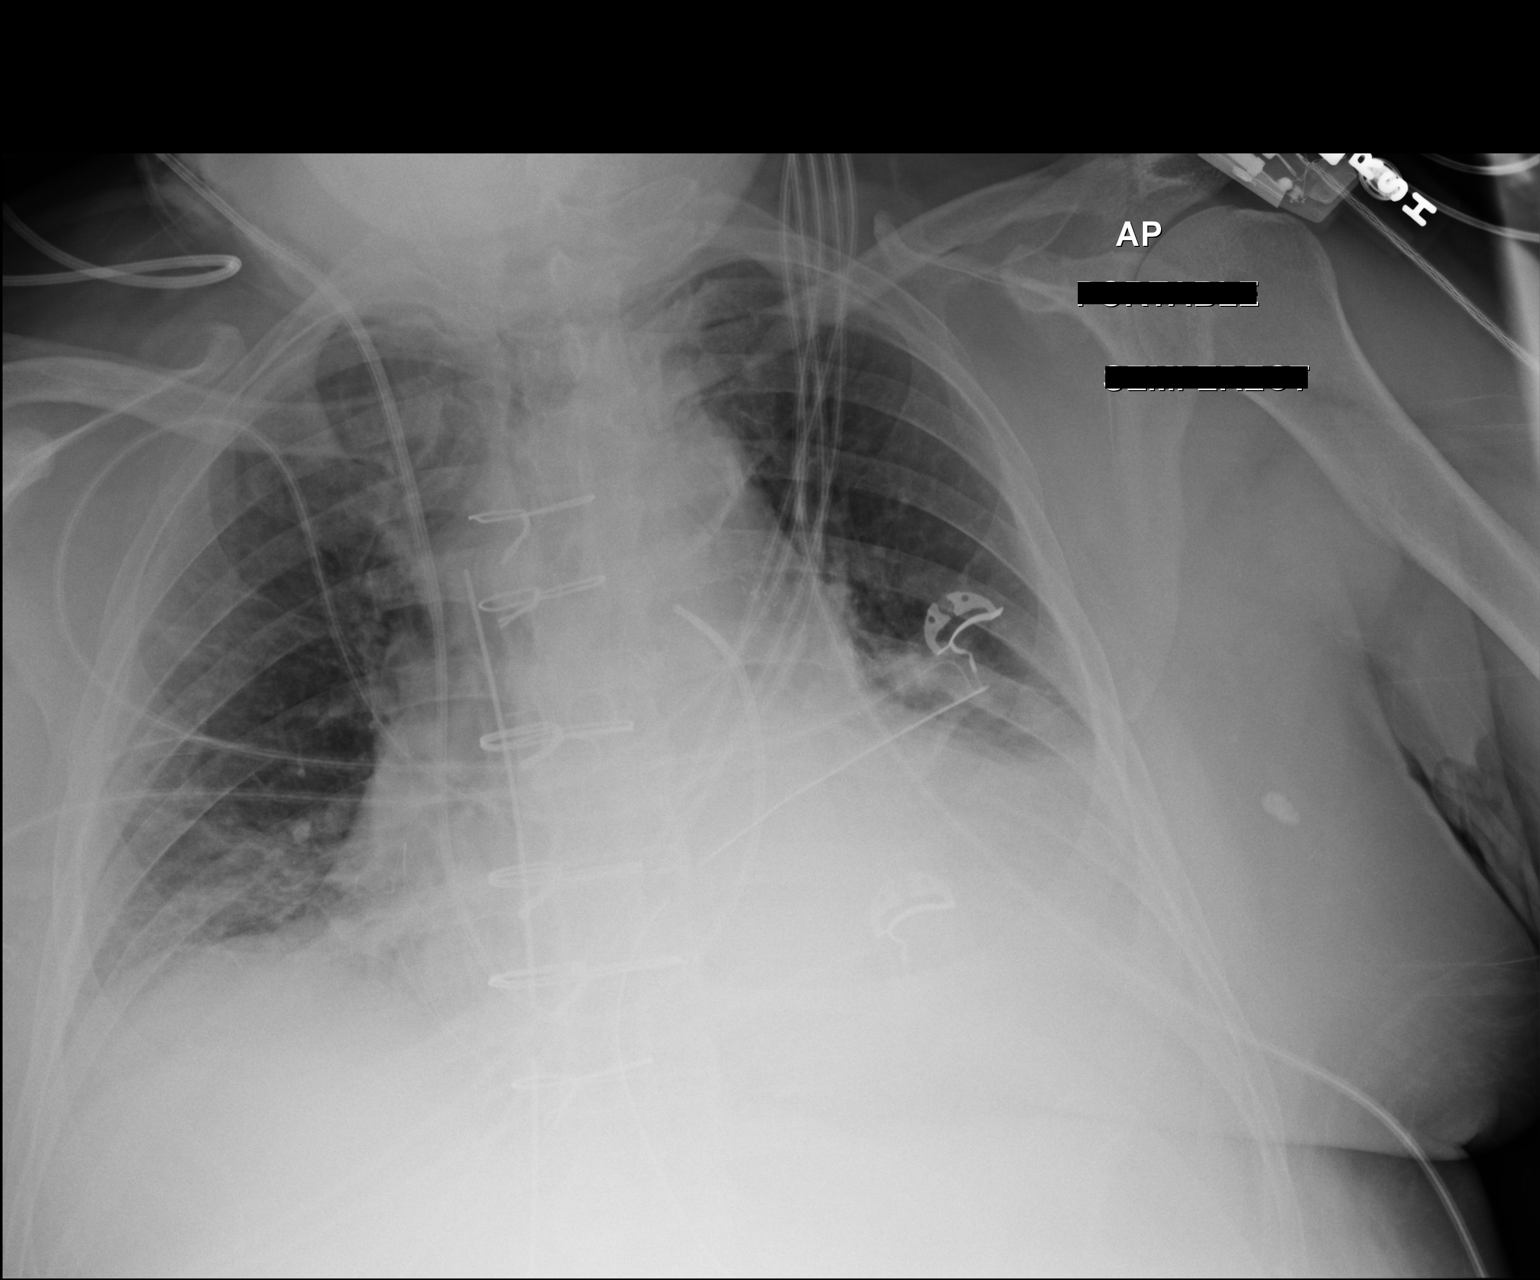

[1 of 1 positions shown; findings below may reference images not displayed]

FINDINGS: There has been interval extubation of the trachea and of the
esophagus. The lungs are mildly hypoinflated. Bibasilar atelectasis
greater on the left is present. Small left pleural effusion is
suspected. The cardiac silhouette remains enlarged. The pulmonary
vascularity is less engorged. The Swan-Ganz catheter tip is stable
in the distal main pulmonary outflow tract. The right-sided
mediastinal drain and the left-sided chest tube are in stable
position. The sternal wires are intact where visualized.
IMPRESSION: Fairly stable appearance of the chest since extubation. Mild hypo
inflation. Persistent in cardiomegaly and mild bibasilar
atelectasis. Probable small left pleural effusion. The remaining
support tubes are in stable position.

## 2017-10-31 IMAGING — CR DG CHEST 1V PORT
1 series · 1 of 1 positions shown · non-contrast
Comparison: 02/26/2016

CLINICAL DATA: Status post coronary bypass grafting

EXAM:
PORTABLE CHEST 1 VIEW

[AP]
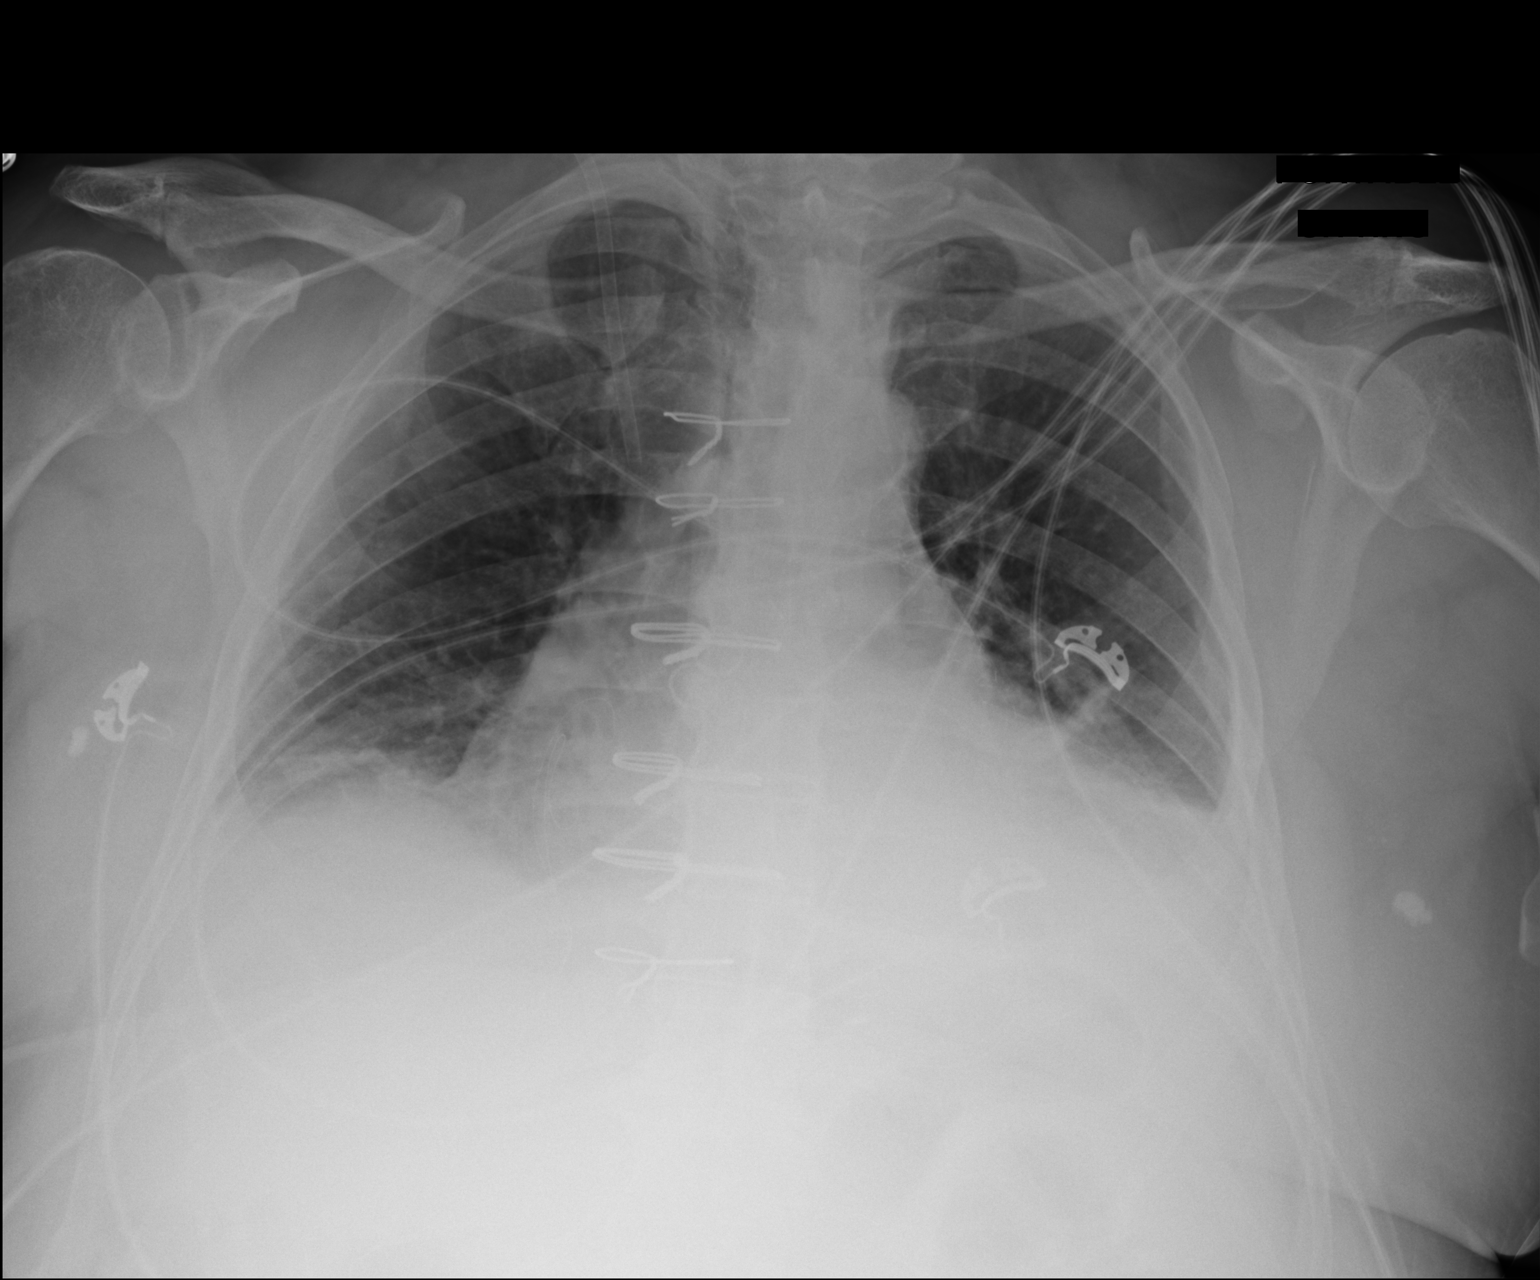

[1 of 1 positions shown; findings below may reference images not displayed]

FINDINGS: Swan-Ganz catheter, mediastinal drain and left thoracostomy catheter
have been removed in the interval. No pneumothorax is noted. Right
jugular sheath remains. Cardiac shadow remains enlarged.
Postsurgical changes are again seen. Bibasilar atelectasis and small
left pleural effusion are again noted and stable.
IMPRESSION: Interval removal of tubes and lines.

Otherwise stable appearance of the chest.

## 2017-11-28 DIAGNOSIS — H903 Sensorineural hearing loss, bilateral: Secondary | ICD-10-CM | POA: Diagnosis not present

## 2017-11-29 DIAGNOSIS — M25562 Pain in left knee: Secondary | ICD-10-CM | POA: Diagnosis not present

## 2017-11-29 DIAGNOSIS — M1711 Unilateral primary osteoarthritis, right knee: Secondary | ICD-10-CM | POA: Diagnosis not present

## 2017-11-29 DIAGNOSIS — M25561 Pain in right knee: Secondary | ICD-10-CM | POA: Diagnosis not present

## 2017-11-30 IMAGING — CR DG CHEST 2V
2 series · 2 of 2 positions shown · non-contrast
Comparison: PA and lateral chest x-ray February 28, 2016

CLINICAL DATA: Status post CABG on February 25, 2016. Patient reports
chest tenderness and shortness of breath

EXAM:
CHEST  2 VIEW

[w chest pa]
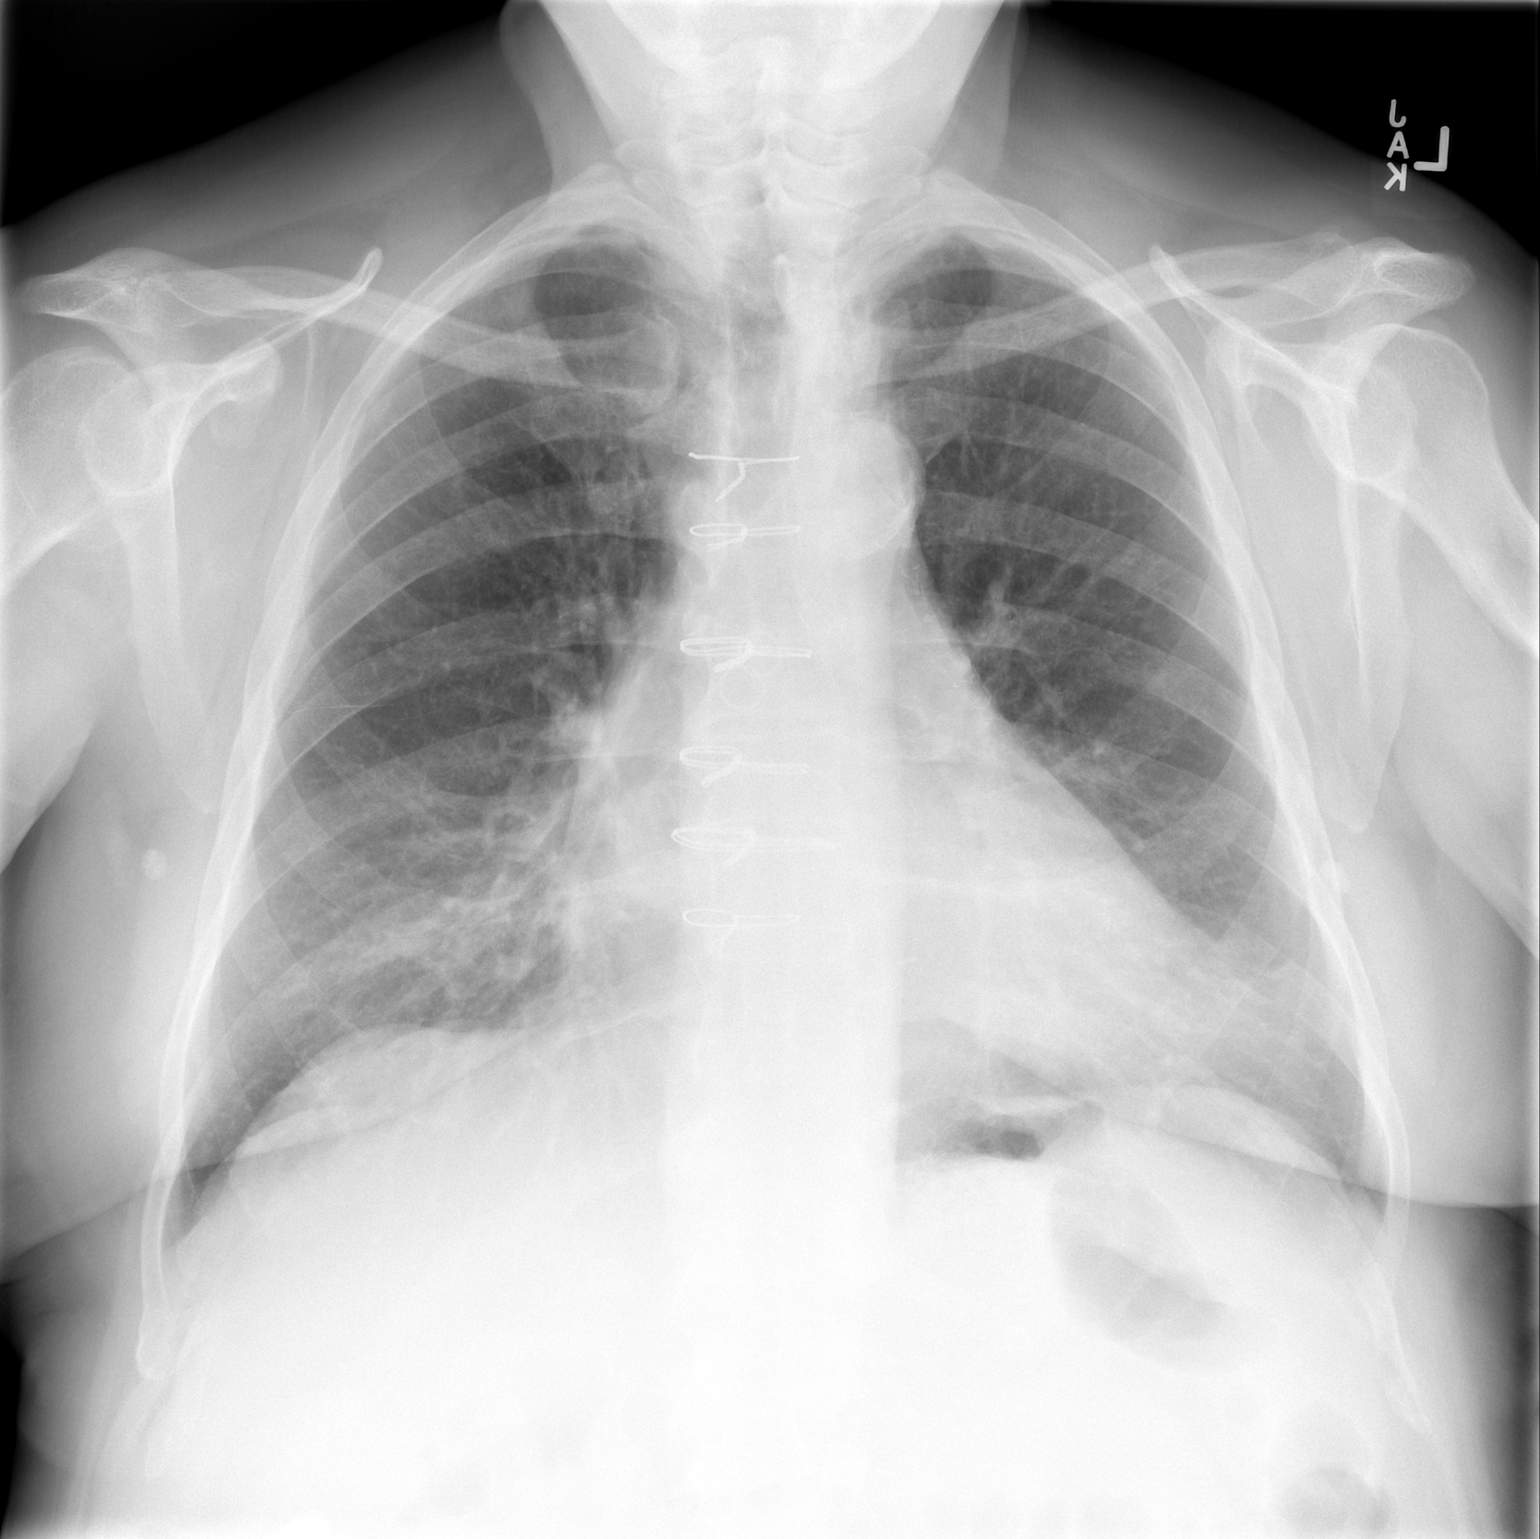

[w chest lat]
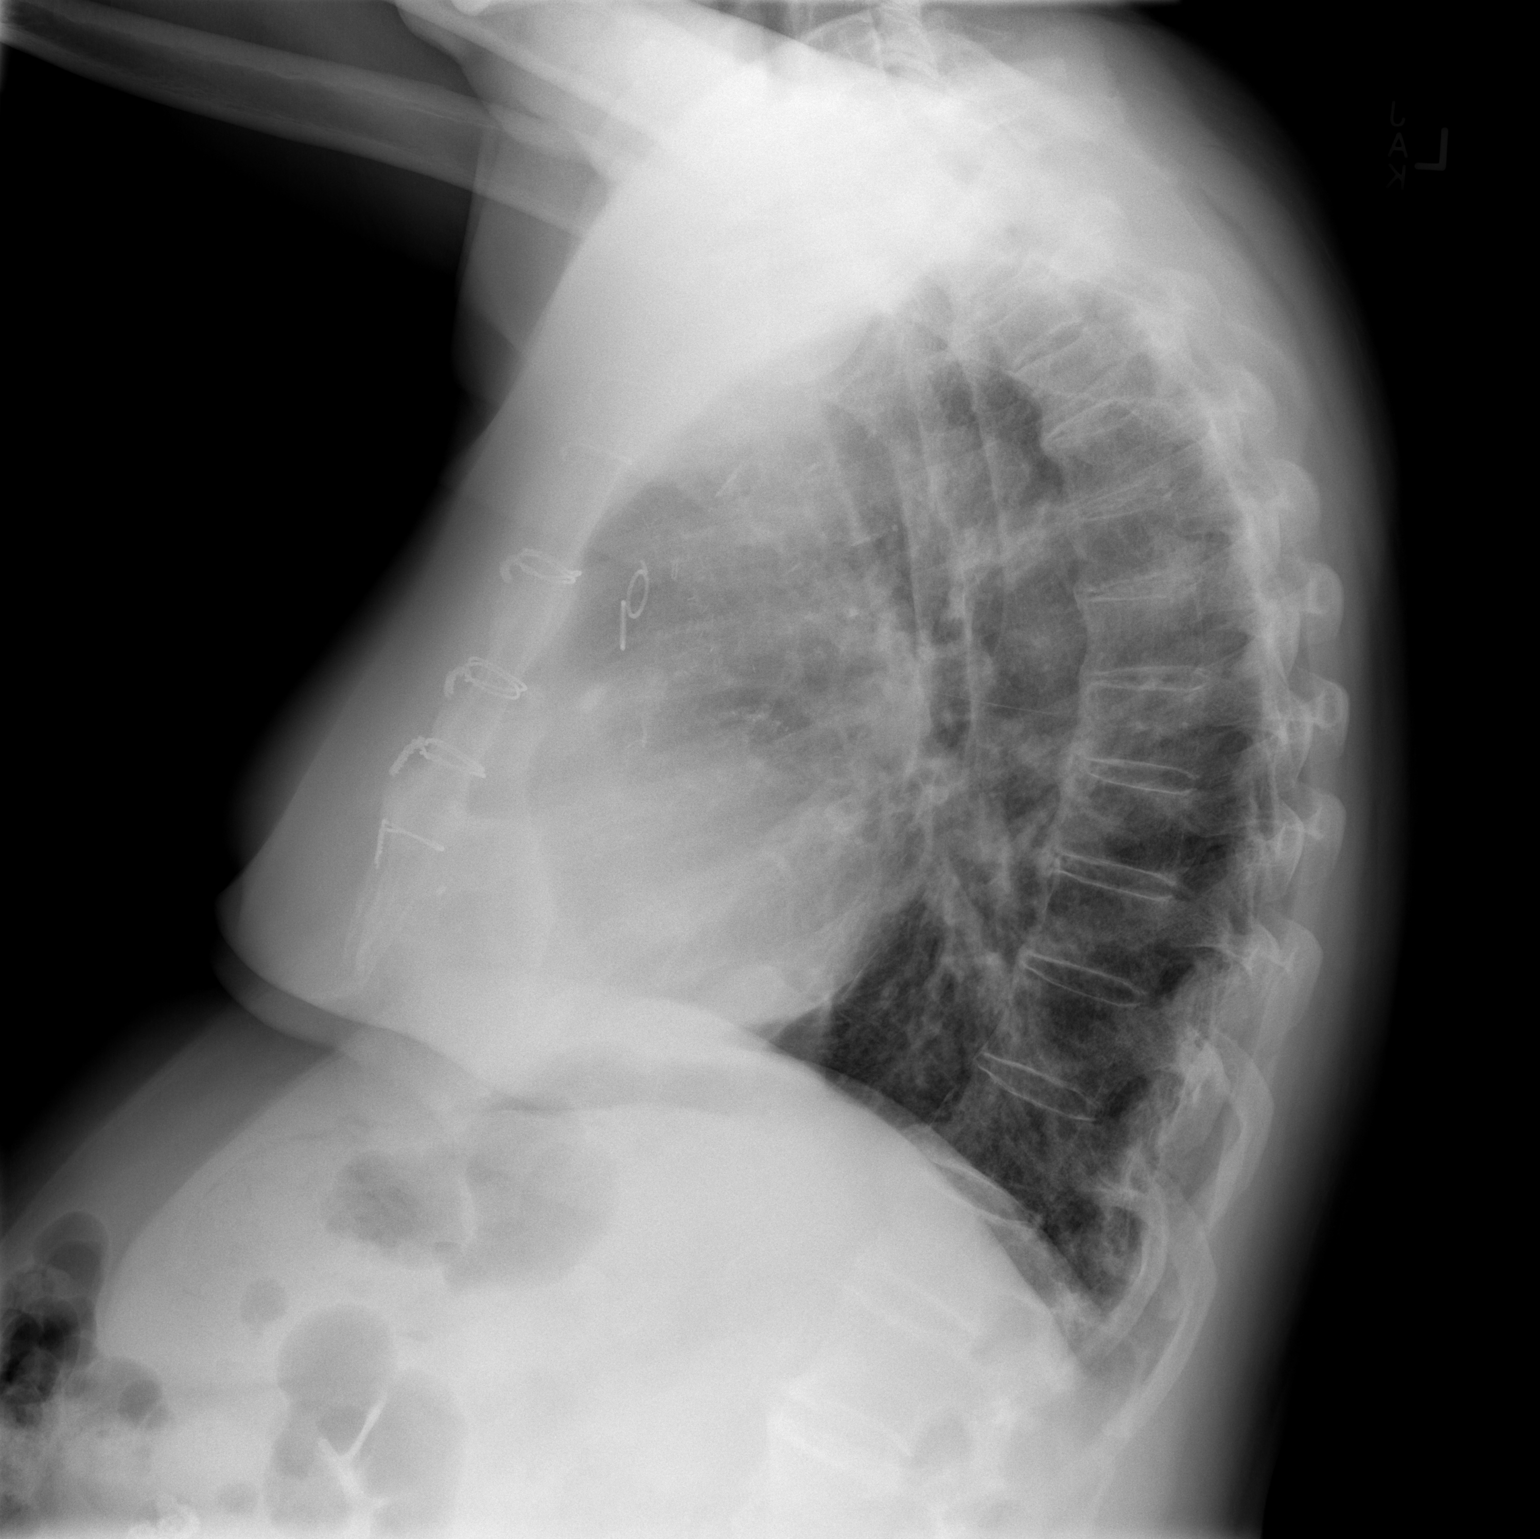

[2 of 2 positions shown; findings below may reference images not displayed]

FINDINGS: The lungs are well-expanded. The pleural effusions and left basilar
atelectatic changes have resolved. Minimal right basilar
subsegmental atelectasis persists. There is no pneumothorax. The
heart is top-normal in size. The pulmonary vascularity is normal.
There is calcification in the wall of the aortic arch. The sternal
wires are intact. There is mild multilevel degenerative disc disease
of the thoracic spine.
IMPRESSION: Considerable improvement in the appearance of the chest since the
previous study with resolution of the pleural effusions and left
lower lobe atelectasis. Minimal subsegmental atelectasis in the
right infrahilar region remains.

Aortic atherosclerosis.

## 2018-01-05 DIAGNOSIS — E785 Hyperlipidemia, unspecified: Secondary | ICD-10-CM | POA: Diagnosis not present

## 2018-01-05 DIAGNOSIS — Z0000 Encounter for general adult medical examination without abnormal findings: Secondary | ICD-10-CM | POA: Diagnosis not present

## 2018-01-05 DIAGNOSIS — I10 Essential (primary) hypertension: Secondary | ICD-10-CM | POA: Diagnosis not present

## 2018-01-05 DIAGNOSIS — R7303 Prediabetes: Secondary | ICD-10-CM | POA: Diagnosis not present

## 2018-01-05 DIAGNOSIS — Z0184 Encounter for antibody response examination: Secondary | ICD-10-CM | POA: Diagnosis not present

## 2018-02-14 ENCOUNTER — Telehealth: Payer: Self-pay | Admitting: Cardiology

## 2018-02-14 DIAGNOSIS — I6529 Occlusion and stenosis of unspecified carotid artery: Secondary | ICD-10-CM

## 2018-02-14 NOTE — Telephone Encounter (Signed)
New message   Patient calling to request order for carotid.

## 2018-02-14 NOTE — Telephone Encounter (Signed)
Patient called in requesting a Carotid Duplex. According to the last office visit the patient was due to have one this month. Orders have been placed and message sent to scheduling. Patient has verbalized his understanding.

## 2018-03-09 ENCOUNTER — Ambulatory Visit (HOSPITAL_COMMUNITY)
Admission: RE | Admit: 2018-03-09 | Discharge: 2018-03-09 | Disposition: A | Discharge status: Home / Self Care | Payer: PPO | Source: Ambulatory Visit | Attending: Cardiology | Admitting: Cardiology

## 2018-03-09 DIAGNOSIS — I6529 Occlusion and stenosis of unspecified carotid artery: Secondary | ICD-10-CM | POA: Diagnosis not present

## 2018-03-14 NOTE — Progress Notes (Signed)
HPI The patient presents for follow up of CAD.  He had a cardiac cath in 2014 demonstrating left main 60% stenosis. The LAD had a proximal 60-70% stenosis. There was mid 40% stenosis. Diagonal is moderate size with 40% stenosis. The circumflex system included a large OM with 60% ostial stenosis the RCA was occluded before the PDA. The EF was 65%. However, we decided to manage him medically unless he had worsening symptoms. In May 2017 I sent him for a screening POET (Plain Old Exercise Treadmill).  This was abnormal with ST depression and was a high risk study. He then underwent cardiac catheterization where he was found to have a 60% ostial left main, a 90% proximal LAD, 90% proximal circumflex, and a diffusely diseased right coronary with a chronically totally occluded posterior descending.  He was taken the operating room on 02/25/2016 for CABG x 4 by Dr. Roxan Hockey.   He returns for follow up.    Since I last saw him he has done well.  The patient denies any new symptoms such as chest discomfort, neck or arm discomfort. There has been no new shortness of breath, PND or orthopnea. There have been no reported palpitations, presyncope or syncope.    He has been very active.  He has been traveling and walks an hour per day.  He golfs.  The patient denies any new symptoms such as chest discomfort, neck or arm discomfort. There has been no new shortness of breath, PND or orthopnea. There have been no reported palpitations, presyncope or syncope.   No Known Allergies  Current Outpatient Medications  Medication Sig Dispense Refill  . amLODipine (NORVASC) 5 MG tablet Take 5 mg by mouth daily.      Marland Kitchen aspirin 81 MG tablet Take 81 mg by mouth daily.      . Coenzyme Q10 (CO Q 10) 100 MG CAPS Take 100 mg by mouth daily.     . CRESTOR 10 MG tablet Take 10 mg by mouth every other day.     . doxazosin (CARDURA) 4 MG tablet Take 4 mg by mouth at bedtime.      . finasteride (PROSCAR) 5 MG tablet Take 5 mg  by mouth daily.      . fish oil-omega-3 fatty acids 1000 MG capsule Take 1 g by mouth daily.      Marland Kitchen gabapentin (NEURONTIN) 300 MG capsule Take 300 mg by mouth at bedtime.    Marland Kitchen GLUCOSAMINE-CHONDROITIN PO Take 2 tablets by mouth daily.     Marland Kitchen latanoprost (XALATAN) 0.005 % ophthalmic solution Place 1 drop into both eyes daily.    Marland Kitchen losartan (COZAAR) 100 MG tablet Take 100 mg by mouth daily.      . metoprolol tartrate (LOPRESSOR) 25 MG tablet Take 1 tablet (25 mg total) by mouth 2 (two) times daily. 180 tablet 3  . Multiple Vitamins-Minerals (MULTIVITAMIN WITH MINERALS) tablet Take 1 tablet by mouth daily.    . Plant Sterols and Stanols (CHOLEST OFF) 450 MG TABS Take 900 mg by mouth daily.     . traMADol (ULTRAM) 50 MG tablet Take 50 mg by mouth as needed.    . ezetimibe (ZETIA) 10 MG tablet Take 10 mg by mouth daily.     No current facility-administered medications for this visit.     Past Medical History:  Diagnosis Date  . Arthritis   . BPH (benign prostatic hyperplasia)   . Bunion   . Callus   . Carotid  artery occlusion   . Coronary artery disease    left main 60% stenosis. The LAD had a proximal 60-70% stenosis. There was mid 40% stenosis. Diagonal is moderate size with 40% stenosis. The circumflex system included a large OM with 60% ostial stenosis the RCA was occluded before the PDA. The EF was 65%.  2014  . Hyperlipidemia   . Hypertension   . Myocardial infarct ([place]) 1992   inferior posterior  . Obesity   . Radiculopathy   . Shortness of breath dyspnea    with exertion -     Past Surgical History:  Procedure Laterality Date  . APPENDECTOMY  1958  . CARDIAC CATHETERIZATION  05/2004   SHOWING 100% OCCLUDED DISTAL RIGHT CORONARY WITH  LEFT  TO RIGHT COLLATERALS, AS WELL AS ANTEGRADE FLOW, NORMAL LEFT MAIN, 50% NARROWING IN LEFT CIRCUMFLEX  WITH IRREGULARITIES IN THE LAD  . CARDIAC CATHETERIZATION N/A 02/12/2016   Procedure: Left Heart Cath and Coronary Angiography;  Surgeon:  Leonie Man, MD;  Location: [facility];  Service: Cardiovascular;  Laterality: N/A;  . CARDIOVASCULAR STRESS TEST  02/26/2010   EF 65%, SMALL AREA OF MILD INFARCT AND POSSIBLE PER-INFARCT ISCHEMIA IN THE INFEROBASAL WALL  . COLONOSCOPY    . CORONARY ANGIOPLASTY     RIGHT CORONARY WERE UNSUCCESSFUL  . CORONARY ARTERY BYPASS GRAFT N/A 02/25/2016   Procedure: CORONARY ARTERY BYPASS GRAFTING (CABG) times four using the left internal mammary artery and the right saphenus vein ;  Surgeon: [name], MD;  Location: [place];  Service: Open Heart Surgery;  Laterality: N/A;  . HERNIA REPAIR  1960   right hernia repair  . INTRAOPERATIVE TRANSESOPHAGEAL ECHOCARDIOGRAM N/A 02/25/2016   Procedure: INTRAOPERATIVE TRANSESOPHAGEAL ECHOCARDIOGRAM;  Surgeon: [name], MD;  Location: [facility];  Service: Open Heart Surgery;  Laterality: N/A;  . UMBILICAL HERNIA REPAIR    . US ECHOCARDIOGRAPHY  01/03/2007   EF 55-60%    ROS:   As stated in the HPI and negative for all other systems.  PHYSICAL EXAM BP 124/60 (BP Location: Left Arm, Patient Position: Sitting, Cuff Size: Normal)   Pulse 68   Ht 5' 2.5" (1.588 m)   Wt 181 lb (82.1 kg)   BMI 32.58 kg/m   GENERAL:  Well appearing NECK:  No jugular venous distention, waveform within normal limits, carotid upstroke brisk and symmetric, no bruits, no thyromegaly LUNGS:  Clear to auscultation bilaterally CHEST:  Well healed sternotomy scar. HEART:  PMI not displaced or sustained,S1 and S2 within normal limits, no S3, no S4, no clicks, no rubs, no murmurs ABD:  Flat, positive bowel sounds normal in frequency in pitch, no bruits, no rebound, no guarding, no midline pulsatile mass, no hepatomegaly, no splenomegaly EXT:  2 plus pulses throughout, no edema, no cyanosis no clubbing    EKG:  Sinus rhythm, rate 62, first-degree AV block, no acute ST-T wave changes.  08/01/17  ASSESSMENT AND PLAN  CAD s/p CABG:    The patient has no new  sypmtoms.  No further cardiovascular testing is indicated.  We will continue with aggressive risk reduction and meds as listed.  HTN:   The blood pressure is at target. No change in medications is indicated. We will continue with therapeutic lifestyle changes (TLC).  HYPERLIPIDEMIA:   LDL in February  was 59 with an HDL of 56.   He will continue with meds as listed.   CAROTID STENOSIS:    He had mild plaque  earlier this month.  No change in therapy.  Mr. he does not need anything he is

## 2018-03-15 ENCOUNTER — Encounter: Payer: Self-pay | Admitting: Cardiology

## 2018-03-15 ENCOUNTER — Ambulatory Visit: Payer: PPO | Admitting: Cardiology

## 2018-03-15 VITALS — BP 124/60 | HR 68 | Ht 62.5 in | Wt 181.0 lb

## 2018-03-15 DIAGNOSIS — E785 Hyperlipidemia, unspecified: Secondary | ICD-10-CM

## 2018-03-15 DIAGNOSIS — I2510 Atherosclerotic heart disease of native coronary artery without angina pectoris: Secondary | ICD-10-CM | POA: Diagnosis not present

## 2018-03-15 DIAGNOSIS — I10 Essential (primary) hypertension: Secondary | ICD-10-CM | POA: Diagnosis not present

## 2018-03-15 NOTE — Patient Instructions (Signed)
Medication Instructions:  Continue current medications  If you need a refill on your cardiac medications before your next appointment, please call your pharmacy.  Labwork: None Ordered   Testing/Procedures: None Ordered  Follow-Up: Your physician wants you to follow-up in: 1 Year. You should receive a reminder letter in the mail two months in advance. If you do not receive a letter, please call our office [phone].    Thank you for choosing [facility]!!      

## 2018-04-18 DIAGNOSIS — L503 Dermatographic urticaria: Secondary | ICD-10-CM | POA: Diagnosis not present

## 2018-04-18 DIAGNOSIS — L255 Unspecified contact dermatitis due to plants, except food: Secondary | ICD-10-CM | POA: Diagnosis not present

## 2018-04-18 DIAGNOSIS — G4700 Insomnia, unspecified: Secondary | ICD-10-CM | POA: Diagnosis not present

## 2018-05-07 DIAGNOSIS — R200 Anesthesia of skin: Secondary | ICD-10-CM | POA: Diagnosis not present

## 2018-05-07 DIAGNOSIS — M21611 Bunion of right foot: Secondary | ICD-10-CM | POA: Diagnosis not present

## 2018-05-07 DIAGNOSIS — L851 Acquired keratosis [keratoderma] palmaris et plantaris: Secondary | ICD-10-CM | POA: Diagnosis not present

## 2018-05-16 DIAGNOSIS — L851 Acquired keratosis [keratoderma] palmaris et plantaris: Secondary | ICD-10-CM | POA: Insufficient documentation

## 2018-07-04 DIAGNOSIS — N138 Other obstructive and reflux uropathy: Secondary | ICD-10-CM | POA: Diagnosis not present

## 2018-07-04 DIAGNOSIS — N401 Enlarged prostate with lower urinary tract symptoms: Secondary | ICD-10-CM | POA: Diagnosis not present

## 2018-07-30 DIAGNOSIS — N139 Obstructive and reflux uropathy, unspecified: Secondary | ICD-10-CM | POA: Diagnosis not present

## 2018-07-30 DIAGNOSIS — N401 Enlarged prostate with lower urinary tract symptoms: Secondary | ICD-10-CM | POA: Diagnosis not present

## 2018-07-30 DIAGNOSIS — R3915 Urgency of urination: Secondary | ICD-10-CM | POA: Diagnosis not present

## 2018-07-30 DIAGNOSIS — N138 Other obstructive and reflux uropathy: Secondary | ICD-10-CM | POA: Diagnosis not present

## 2018-08-03 DIAGNOSIS — E785 Hyperlipidemia, unspecified: Secondary | ICD-10-CM | POA: Diagnosis not present

## 2018-08-03 DIAGNOSIS — Z0184 Encounter for antibody response examination: Secondary | ICD-10-CM | POA: Diagnosis not present

## 2018-08-03 DIAGNOSIS — M791 Myalgia, unspecified site: Secondary | ICD-10-CM | POA: Diagnosis not present

## 2018-08-03 DIAGNOSIS — I10 Essential (primary) hypertension: Secondary | ICD-10-CM | POA: Diagnosis not present

## 2018-08-03 DIAGNOSIS — R7303 Prediabetes: Secondary | ICD-10-CM | POA: Diagnosis not present

## 2018-08-03 LAB — BASIC METABOLIC PANEL
BUN: 21 (ref 4–21)
Chloride: 100 (ref 99–108)
Creatinine: 1 (ref 0.6–1.3)
Glucose: 131
Potassium: 4.1 (ref 3.4–5.3)
Sodium: 138 (ref 137–147)

## 2018-08-03 LAB — LIPID PANEL
Cholesterol: 151 (ref 0–200)
HDL: 50 (ref 35–70)
LDL Cholesterol: 80
Triglycerides: 105 (ref 40–160)

## 2018-08-03 LAB — COMPREHENSIVE METABOLIC PANEL: Calcium: 8.9 (ref 8.7–10.7)

## 2018-08-03 LAB — HEPATIC FUNCTION PANEL
ALT: 20 (ref 10–40)
AST: 23 (ref 14–40)
Alkaline Phosphatase: 58 (ref 25–125)
Bilirubin, Total: 1.4

## 2018-08-03 LAB — HEMOGLOBIN A1C: Hemoglobin A1C: 5.8

## 2018-08-08 DIAGNOSIS — E7849 Other hyperlipidemia: Secondary | ICD-10-CM | POA: Diagnosis not present

## 2018-08-08 DIAGNOSIS — R7303 Prediabetes: Secondary | ICD-10-CM | POA: Diagnosis not present

## 2018-08-08 DIAGNOSIS — I10 Essential (primary) hypertension: Secondary | ICD-10-CM | POA: Diagnosis not present

## 2018-08-15 ENCOUNTER — Other Ambulatory Visit: Payer: Self-pay | Admitting: Cardiology

## 2018-08-15 MED ORDER — METOPROLOL TARTRATE 25 MG PO TABS: 25.0000 mg | ORAL_TABLET | Freq: Two times a day (BID) | ORAL | 3 refills | Status: DC

## 2018-08-15 NOTE — Telephone Encounter (Signed)
° ° °  Patient is out of medication, leaving going out of town today  1. Which medications need to be refilled? (please list name of each medication and dose if known) metoprolol tartrate (LOPRESSOR) 25 MG tablet  2. Which pharmacy/location (including street and city if local pharmacy) is medication to be sent to? [facility] at [place], [place]  3. Do they need a 30 day or 90 day supply? [place]

## 2018-08-15 NOTE — Telephone Encounter (Signed)
Called patient and he verified his DOB and the dosage amount for his Metoprolol and how he often he takes it. Patient also verified the pharmacy and was informed that it was sent to the [facility] on [facility].

## 2018-08-16 NOTE — Progress Notes (Signed)
HPI The patient presents for follow up of CAD.  He had a cardiac cath in 2014 demonstrating left main 60% stenosis. The LAD had a proximal 60-70% stenosis. There was mid 40% stenosis. Diagonal is moderate size with 40% stenosis. The circumflex system included a large OM with 60% ostial stenosis the RCA was occluded before the PDA. The EF was 65%. However, we decided to manage him medically unless he had worsening symptoms. In May 2017 I sent him for a screening POET (Plain Old Exercise Treadmill).  This was abnormal with ST depression and was a high risk study. He then underwent cardiac catheterization where he was found to have a 60% ostial left main, a 90% proximal LAD, 90% proximal circumflex, and a diffusely diseased right coronary with a chronically totally occluded posterior descending.  He was taken the operating room on 02/25/2016 for CABG x 4 by Dr. Roxan Hockey.   He returns for follow up.    He was referred back here.  He is in a clinical trial evaluating some eyedrops.  I do not have the details on this.  But he was noted to have some bradycardia.  His heart rate was said to be in the 30s.  He went home and took it and it was in the 24s and 93s.  However, he has had no symptoms.  He does not feel any presyncope or syncope.  He does not notice any palpitations.  Is not had any chest pressure, neck or arm discomfort.  He remains very active.  He said may be occasionally when he first starts out walking he might feel a little lightheaded but this quickly resolves.  No Known Allergies  Current Outpatient Medications  Medication Sig Dispense Refill  . amLODipine (NORVASC) 2.5 MG tablet Take 2.5 mg by mouth daily.    Marland Kitchen aspirin 81 MG tablet Take 81 mg by mouth daily.      . Coenzyme Q10 (CO Q 10 PO) Take 100 mg by mouth daily.     . CRESTOR 10 MG tablet Take 10 mg by mouth every other day.     . doxazosin (CARDURA) 4 MG tablet Take 4 mg by mouth at bedtime.      Marland Kitchen ezetimibe (ZETIA) 10 MG  tablet Take 10 mg by mouth daily.    . finasteride (PROSCAR) 5 MG tablet Take 5 mg by mouth daily.      . fish oil-omega-3 fatty acids 1000 MG capsule Take 1 g by mouth daily.      Marland Kitchen gabapentin (NEURONTIN) 300 MG capsule Take 300 mg by mouth at bedtime.    Marland Kitchen GLUCOSAMINE-CHONDROITIN PO Take 2 tablets by mouth daily.     Marland Kitchen latanoprost (XALATAN) 0.005 % ophthalmic solution Place 1 drop into both eyes daily.    Marland Kitchen losartan (COZAAR) 100 MG tablet Take 100 mg by mouth daily.      . Multiple Vitamins-Minerals (MULTIVITAMIN WITH MINERALS) tablet Take 1 tablet by mouth daily.    . Plant Sterols and Stanols (CHOLEST OFF) 450 MG TABS Take 900 mg by mouth daily.     Marland Kitchen amLODipine (NORVASC) 5 MG tablet Take 5 mg by mouth daily.      . traMADol (ULTRAM) 50 MG tablet Take 50 mg by mouth as needed.     No current facility-administered medications for this visit.     Past Medical History:  Diagnosis Date  . Arthritis   . BPH (benign prostatic hyperplasia)   . Bunion   .  Callus   . Carotid artery occlusion   . Coronary artery disease    left main 60% stenosis. The LAD had a proximal 60-70% stenosis. There was mid 40% stenosis. Diagonal is moderate size with 40% stenosis. The circumflex system included a large OM with 60% ostial stenosis the RCA was occluded before the PDA. The EF was 65%.  2014  . Hyperlipidemia   . Hypertension   . Myocardial infarct ([place]) 1992   inferior posterior  . Obesity   . Radiculopathy   . Shortness of breath dyspnea    with exertion -     Past Surgical History:  Procedure Laterality Date  . APPENDECTOMY  1958  . CARDIAC CATHETERIZATION  05/2004   SHOWING 100% OCCLUDED DISTAL RIGHT CORONARY WITH  LEFT  TO RIGHT COLLATERALS, AS WELL AS ANTEGRADE FLOW, NORMAL LEFT MAIN, 50% NARROWING IN LEFT CIRCUMFLEX  WITH IRREGULARITIES IN THE LAD  . CARDIAC CATHETERIZATION N/A 02/12/2016   Procedure: Left Heart Cath and Coronary Angiography;  Surgeon: Leonie Man, MD;  Location: [facility] [facility];  Service: Cardiovascular;  Laterality: N/A;  . CARDIOVASCULAR STRESS TEST  02/26/2010   EF 65%, SMALL AREA OF MILD INFARCT AND POSSIBLE PER-INFARCT ISCHEMIA IN THE INFEROBASAL WALL  . COLONOSCOPY    . CORONARY ANGIOPLASTY     RIGHT CORONARY WERE UNSUCCESSFUL  . CORONARY ARTERY BYPASS GRAFT N/A 02/25/2016   Procedure: CORONARY ARTERY BYPASS GRAFTING (CABG) times four using the left internal mammary artery and the right saphenus vein ;  Surgeon: [name], MD;  Location: [facility];  Service: Open Heart Surgery;  Laterality: N/A;  . HERNIA REPAIR  1960   right hernia repair  . INTRAOPERATIVE TRANSESOPHAGEAL ECHOCARDIOGRAM N/A 02/25/2016   Procedure: INTRAOPERATIVE TRANSESOPHAGEAL ECHOCARDIOGRAM;  Surgeon: [name], MD;  Location: [place];  Service: Open Heart Surgery;  Laterality: N/A;  . UMBILICAL HERNIA REPAIR    . US ECHOCARDIOGRAPHY  01/03/2007   EF 55-60%    ROS:   As stated in the HPI and negative for all other systems.   PHYSICAL EXAM BP 104/62   Pulse (!) 52   Ht 5\' 3"  (1.6 m)   Wt 182 lb 9.6 oz (82.8 kg)   BMI 32.35 kg/m   GENERAL:  Well appearing NECK:  No jugular venous distention, waveform within normal limits, carotid upstroke brisk and symmetric, no bruits, no thyromegaly LUNGS:  Clear to auscultation bilaterally CHEST: Well healed sternotomy scar. HEART:  PMI not displaced or sustained,S1 and S2 within normal limits, no S3, no S4, no clicks, no rubs, no murmurs ABD:  Flat, positive bowel sounds normal in frequency in pitch, no bruits, no rebound, no guarding, no midline pulsatile mass, no hepatomegaly, no splenomegaly EXT:  2 plus pulses throughout, trace edema, no cyanosis no clubbing    EKG:  Sinus rhythm, rate 52, first-degree AV block, Mobitz type I  heart block.  No acute ST-T wave changes.  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  However, he is not  having any symptoms related to this.  For now I am to stop the beta-blocker.  In about 2 weeks and get a check a 24-hour Holter.  There is no indication for pacing at this point.  He will let me know if he has dizziness or lightheadedness.  CAD s/p CABG:    The patient has no new sypmtoms.  No further cardiovascular testing is indicated.  We will continue with aggressive risk reduction and meds as listed.  HTN:   The blood pressure is actually low.  Med change as above.   HYPERLIPIDEMIA:   LDL in February  was 60 with an HDL of 56.  No change in therapy.   CAROTID STENOSIS:    He had mild plaque earlier this month.  No change in therapy.

## 2018-08-17 ENCOUNTER — Encounter: Payer: Self-pay | Admitting: Cardiology

## 2018-08-17 ENCOUNTER — Ambulatory Visit: Payer: PPO | Admitting: Cardiology

## 2018-08-17 VITALS — BP 104/62 | HR 52 | Ht 63.0 in | Wt 182.6 lb

## 2018-08-17 DIAGNOSIS — I2510 Atherosclerotic heart disease of native coronary artery without angina pectoris: Secondary | ICD-10-CM | POA: Diagnosis not present

## 2018-08-17 DIAGNOSIS — I441 Atrioventricular block, second degree: Secondary | ICD-10-CM

## 2018-08-17 NOTE — Patient Instructions (Addendum)
Medication Instructions:  STOP METOPROLOL   If you need a refill on your cardiac medications before your next appointment, please call your pharmacy.   Lab work: NONE  Testing/Procedures: Your physician has recommended that you wear a holter monitor. Holter monitors are medical devices that record the heart's electrical activity. Doctors most often use these monitors to diagnose arrhythmias. Arrhythmias are problems with the speed or rhythm of the heartbeat. The monitor is a small, portable device. You can wear one while you do your normal daily activities. This is usually used to diagnose what is causing palpitations/syncope (passing out). IN 2 WEEKS  [facility] [address]   Follow-Up: At [facility], you and your health needs are our priority.  As part of our continuing mission to provide you with exceptional heart care, we have created designated Provider Care Teams.  These Care Teams include your primary Cardiologist (physician) and Advanced Practice Providers (APPs -  Physician Assistants and Nurse Practitioners) who all work together to provide you with the care you need, when you need it. You will need a follow up appointment in 4 weeks. You may see DR [facility]  or one of the following Advanced Practice Providers on your designated Care Team:   Rosaria Ferries, PA-C . Jory Sims, DNP, ANP   Holter Monitoring A Holter monitor is a small device that is used to detect abnormal heart rhythms. It clips to your clothing and is connected by wires to flat, sticky disks (electrodes) that attach to your chest. It is worn continuously for 24-48 hours. Follow these instructions at home:  Wear your Holter monitor at all times, even while exercising and sleeping, for as long as directed by your health care provider.  Make sure that the Holter monitor is safely clipped to your clothing or close to your body as recommended by your health care provider.  Do not get the  monitor or wires wet.  Do not put body lotion or moisturizer on your chest.  Keep your skin clean.  Keep a diary of your daily activities, such as walking and doing chores. If you feel that your heartbeat is abnormal or that your heart is fluttering or skipping a beat: ? Record what you are doing when it happens. ? Record what time of day the symptoms occur.  Return your Holter monitor as directed by your health care provider.  Keep all follow-up visits as directed by your health care provider. This is important. Get help right away if:  You feel lightheaded or you faint.  You have trouble breathing.  You feel pain in your chest, upper arm, or jaw.  You feel sick to your stomach and your skin is pale, cool, or damp.  You heartbeat feels unusual or abnormal. This information is not intended to replace advice given to you by your health care provider. Make sure you discuss any questions you have with your health care provider. Document Released: 05/13/2004 Document Revised: 01/21/2016 Document Reviewed: 03/24/2014 Elsevier Interactive Patient Education  [employer].

## 2018-09-04 ENCOUNTER — Ambulatory Visit (INDEPENDENT_AMBULATORY_CARE_PROVIDER_SITE_OTHER): Payer: PPO

## 2018-09-04 ENCOUNTER — Other Ambulatory Visit: Payer: Self-pay | Admitting: Cardiology

## 2018-09-04 DIAGNOSIS — I441 Atrioventricular block, second degree: Secondary | ICD-10-CM

## 2018-09-04 DIAGNOSIS — R001 Bradycardia, unspecified: Secondary | ICD-10-CM

## 2018-09-10 NOTE — Progress Notes (Signed)
HPI The patient presents for follow up of CAD.  He had a cardiac cath in 2014 demonstrating left main 60% stenosis. The LAD had a proximal 60-70% stenosis. There was mid 40% stenosis. Diagonal is moderate size with 40% stenosis. The circumflex system included a large OM with 60% ostial stenosis the RCA was occluded before the PDA. The EF was 65%. However, we decided to manage him medically unless he had worsening symptoms. In May 2017 I sent him for a screening POET (Plain Old Exercise Treadmill).  This was abnormal with ST depression and was a high risk study. He then underwent cardiac catheterization where he was found to have a 60% ostial left main, a 90% proximal LAD, 90% proximal circumflex, and a diffusely diseased right coronary with a chronically totally occluded posterior descending.  He was taken the operating room on 02/25/2016 for CABG x 4 by Dr. Roxan Hockey.   He saw me recently for bradycardia and I stopped his beta blocker.  A Holter demonstrated sinus with Mobitz Type 1.  He did have brief runs of 2:1 block without symptoms.  There were no sustained pauses.    He has since felt well.  His heart rates on his monitor have been fine.  His blood pressures have intermittently been elevated with 12 of 20 readings being above 140.  He denies any chest pressure, neck or arm discomfort.  He has had no palpitations, presyncope or syncope.  He has had no shortness of breath, PND or orthopnea.  He has had no weight gain or edema    No Known Allergies  Current Outpatient Medications  Medication Sig Dispense Refill  . amLODipine (NORVASC) 5 MG tablet Take 1 tablet (5 mg total) by mouth daily. 90 tablet 2  . aspirin 81 MG tablet Take 81 mg by mouth daily.      . Coenzyme Q10 (CO Q 10 PO) Take 100 mg by mouth daily.     . CRESTOR 10 MG tablet Take 10 mg by mouth every other day.     . doxazosin (CARDURA) 4 MG tablet Take 4 mg by mouth at bedtime.      Marland Kitchen ezetimibe (ZETIA) 10 MG tablet Take 10  mg by mouth daily.    . finasteride (PROSCAR) 5 MG tablet Take 5 mg by mouth daily.      . fish oil-omega-3 fatty acids 1000 MG capsule Take 1 g by mouth daily.      Marland Kitchen gabapentin (NEURONTIN) 300 MG capsule Take 300 mg by mouth at bedtime.    Marland Kitchen GLUCOSAMINE-CHONDROITIN PO Take 2 tablets by mouth daily.     Marland Kitchen latanoprost (XALATAN) 0.005 % ophthalmic solution Place 1 drop into both eyes daily.    Marland Kitchen losartan (COZAAR) 100 MG tablet Take 100 mg by mouth daily.      . Multiple Vitamins-Minerals (MULTIVITAMIN WITH MINERALS) tablet Take 1 tablet by mouth daily.    . Plant Sterols and Stanols (CHOLEST OFF) 450 MG TABS Take 900 mg by mouth daily.     . traMADol (ULTRAM) 50 MG tablet Take 50 mg by mouth as needed.     No current facility-administered medications for this visit.     Past Medical History:  Diagnosis Date  . Arthritis   . BPH (benign prostatic hyperplasia)   . Bunion   . Callus   . Carotid artery occlusion   . Coronary artery disease    left main 60% stenosis. The LAD had a proximal  60-70% stenosis. There was mid 40% stenosis. Diagonal is moderate size with 40% stenosis. The circumflex system included a large OM with 60% ostial stenosis the RCA was occluded before the PDA. The EF was 65%.  2014  . Hyperlipidemia   . Hypertension   . Myocardial infarct ([place]) 1992   inferior posterior  . Obesity   . Radiculopathy   . Shortness of breath dyspnea    with exertion -     Past Surgical History:  Procedure Laterality Date  . APPENDECTOMY  1958  . CARDIAC CATHETERIZATION  05/2004   SHOWING 100% OCCLUDED DISTAL RIGHT CORONARY WITH  LEFT  TO RIGHT COLLATERALS, AS WELL AS ANTEGRADE FLOW, NORMAL LEFT MAIN, 50% NARROWING IN LEFT CIRCUMFLEX  WITH IRREGULARITIES IN THE LAD  . CARDIAC CATHETERIZATION N/A 02/12/2016   Procedure: Left Heart Cath and Coronary Angiography;  Surgeon: Leonie Man, MD;  Location: [place] [facility];  Service: Cardiovascular;  Laterality: N/A;  . CARDIOVASCULAR  STRESS TEST  02/26/2010   EF 65%, SMALL AREA OF MILD INFARCT AND POSSIBLE PER-INFARCT ISCHEMIA IN THE INFEROBASAL WALL  . COLONOSCOPY    . CORONARY ANGIOPLASTY     RIGHT CORONARY WERE UNSUCCESSFUL  . CORONARY ARTERY BYPASS GRAFT N/A 02/25/2016   Procedure: CORONARY ARTERY BYPASS GRAFTING (CABG) times four using the left internal mammary artery and the right saphenus vein ;  Surgeon: [name], MD;  Location: [place];  Service: Open Heart Surgery;  Laterality: N/A;  . HERNIA REPAIR  1960   right hernia repair  . INTRAOPERATIVE TRANSESOPHAGEAL ECHOCARDIOGRAM N/A 02/25/2016   Procedure: INTRAOPERATIVE TRANSESOPHAGEAL ECHOCARDIOGRAM;  Surgeon: [name], MD;  Location: [place];  Service: Open Heart Surgery;  Laterality: N/A;  . UMBILICAL HERNIA REPAIR    . US ECHOCARDIOGRAPHY  01/03/2007   EF 55-60%    ROS:   As stated in the HPI and negative for all other systems.    PHYSICAL EXAM BP 116/62   Pulse 68   Ht 5\' 3"  (1.6 m)   Wt 149 lb 6.4 oz (67.8 kg)   SpO2 96%   BMI 26.47 kg/m   GENERAL:  Well appearing NECK:  No jugular venous distention, waveform within normal limits, carotid upstroke brisk and symmetric, no bruits, no thyromegaly LUNGS:  Clear to auscultation bilaterally CHEST:  Well healed sternotomy scar. HEART:  PMI not displaced or sustained,S1 and S2 within normal limits, no S3, no S4, no clicks, no rubs, no murmurs ABD:  Flat, positive bowel sounds normal in frequency in pitch, no bruits, no rebound, no guarding, no midline pulsatile mass, no hepatomegaly, no splenomegaly EXT:  2 plus pulses throughout, no edema, no cyanosis no clubbing   EKG:  NA  ASSESSMENT AND PLAN   BRADYCARDIA:   He had a Holter as above.   He has no new symptoms.  No change in therapy is indicated.  He let me know if he ever does have any presyncope or syncope to go along with the block as described.   CAD s/p CABG:    The patient has no new symptoms.  No further cardiovascular  testing is suggested.   HTN:   The blood pressure is slightly elevated.  I am can increase his Norvasc to 5 mg daily.    HYPERLIPIDEMIA:   LDL in February  was 71 with an HDL of 56.   CAROTID STENOSIS:    He had mild plaque last month.  No change in therapy.  No  further imaging.

## 2018-09-11 ENCOUNTER — Encounter: Payer: Self-pay | Admitting: Cardiology

## 2018-09-12 ENCOUNTER — Ambulatory Visit: Payer: PPO | Admitting: Cardiology

## 2018-09-12 ENCOUNTER — Encounter: Payer: Self-pay | Admitting: Cardiology

## 2018-09-12 VITALS — BP 116/62 | HR 68 | Ht 63.0 in | Wt 149.4 lb

## 2018-09-12 DIAGNOSIS — I10 Essential (primary) hypertension: Secondary | ICD-10-CM | POA: Diagnosis not present

## 2018-09-12 DIAGNOSIS — R001 Bradycardia, unspecified: Secondary | ICD-10-CM

## 2018-09-12 MED ORDER — AMLODIPINE BESYLATE 5 MG PO TABS: 5.0000 mg | ORAL_TABLET | Freq: Every day | ORAL | 2 refills | Status: DC

## 2018-09-12 NOTE — Patient Instructions (Addendum)
Medication Instructions:  INCREASE- Amlodipine 5 mg daily  If you need a refill on your cardiac medications before your next appointment, please call your pharmacy.  Labwork: None Ordered   Take the provided lab slips with you to the lab for your blood draw.  When you have your labs (blood work) drawn today and your tests are completely normal, you will receive your results only by MyChart Message (if you have MyChart) -OR-  A paper copy in the mail.  If you have any lab test that is abnormal or we need to change your treatment, we will call you to review these results.  Testing/Procedures: None Ordered  Follow-Up: You will need a follow up appointment in 6 months.  Please call our office 2 months in advance to schedule this appointment.  You may see Dr Percival Spanish or one of the following Advanced Practice Providers on your designated Care Team:   Rosaria Ferries, PA-C . Jory Sims, DNP, ANP   At [facility], you and your health needs are our priority.  As part of our continuing mission to provide you with exceptional heart care, we have created designated Provider Care Teams.  These Care Teams include your primary Cardiologist (physician) and Advanced Practice Providers (APPs -  Physician Assistants and Nurse Practitioners) who all work together to provide you with the care you need, when you need it.  Thank you for choosing [facility] at [facility]!!    [employer] Phone: [phone]

## 2019-02-06 ENCOUNTER — Encounter: Payer: Self-pay | Admitting: *Deleted

## 2019-02-07 ENCOUNTER — Encounter: Payer: Self-pay | Admitting: *Deleted

## 2019-02-12 ENCOUNTER — Other Ambulatory Visit: Payer: Self-pay | Admitting: *Deleted

## 2019-02-12 NOTE — Patient Outreach (Signed)
High Risk HTA referral.  Curtis Tyler is doing very well. He reports his only problematic health issue currently is BPH. He does see a urologist and is on medication for his frequency, urgency and nocturia.  He has a hx of CAD and has had cardiovascular and carotid studies.  His wife is receiving [facility] services.  Informed pt of available care management services through [facility] and we agreed he is not in need of these services at this time.  Eulah Pont. Myrtie Neither, MSN, [facility] Gerontological Nurse Practitioner [facility] Care Management [phone]

## 2019-02-18 DIAGNOSIS — H401131 Primary open-angle glaucoma, bilateral, mild stage: Secondary | ICD-10-CM | POA: Diagnosis not present

## 2019-03-04 DIAGNOSIS — M5136 Other intervertebral disc degeneration, lumbar region: Secondary | ICD-10-CM | POA: Diagnosis not present

## 2019-03-04 DIAGNOSIS — M5416 Radiculopathy, lumbar region: Secondary | ICD-10-CM | POA: Diagnosis not present

## 2019-03-04 DIAGNOSIS — M7061 Trochanteric bursitis, right hip: Secondary | ICD-10-CM | POA: Diagnosis not present

## 2019-03-04 DIAGNOSIS — M545 Low back pain: Secondary | ICD-10-CM | POA: Diagnosis not present

## 2019-03-18 ENCOUNTER — Encounter: Payer: Self-pay | Admitting: Physical Therapy

## 2019-03-18 ENCOUNTER — Ambulatory Visit: Payer: PPO | Attending: Specialist | Admitting: Physical Therapy

## 2019-03-18 ENCOUNTER — Other Ambulatory Visit: Payer: Self-pay

## 2019-03-18 DIAGNOSIS — M62830 Muscle spasm of back: Secondary | ICD-10-CM | POA: Insufficient documentation

## 2019-03-18 DIAGNOSIS — M25551 Pain in right hip: Secondary | ICD-10-CM | POA: Insufficient documentation

## 2019-03-18 DIAGNOSIS — M5441 Lumbago with sciatica, right side: Secondary | ICD-10-CM | POA: Insufficient documentation

## 2019-03-18 NOTE — Therapy (Signed)
[place], [place], [address] Phone: [phone]   Fax:  [phone]  Physical Therapy Evaluation  Patient Details  Name: Curtis Tyler MRN: [id number] Date of Birth: 09-Dec-1940 Referring Provider (PT): Beane   Encounter Date: 03/18/2019  PT End of Session - 03/18/19 1513    Visit Number  1    Date for PT Re-Evaluation  05/19/19    PT Start Time  2505    PT Stop Time  1533    PT Time Calculation (min)  50 min    Activity Tolerance  Patient tolerated treatment well    Behavior During Therapy  [facility] for tasks assessed/performed       Past Medical History:  Diagnosis Date  . Arthritis   . BPH (benign prostatic hyperplasia)   . Bunion   . Callus   . Carotid artery occlusion   . Coronary artery disease    left main 60% stenosis. The LAD had a proximal 60-70% stenosis. There was mid 40% stenosis. Diagonal is moderate size with 40% stenosis. The circumflex system included a large OM with 60% ostial stenosis the RCA was occluded before the PDA. The EF was 65%.  2014  . Hyperlipidemia   . Hypertension   . Myocardial infarct ([place]) 1992   inferior posterior  . Obesity   . Radiculopathy   . Shortness of breath dyspnea    with exertion -     Past Surgical History:  Procedure Laterality Date  . APPENDECTOMY  1958  . CARDIAC CATHETERIZATION  05/2004   SHOWING 100% OCCLUDED DISTAL RIGHT CORONARY WITH  LEFT  TO RIGHT COLLATERALS, AS WELL AS ANTEGRADE FLOW, NORMAL LEFT MAIN, 50% NARROWING IN LEFT CIRCUMFLEX  WITH IRREGULARITIES IN THE LAD  . CARDIAC CATHETERIZATION N/A 02/12/2016   Procedure: Left Heart Cath and Coronary Angiography;  Surgeon: Leonie Man, MD;  Location: [place] [facility];  Service: Cardiovascular;  Laterality: N/A;  . CARDIOVASCULAR STRESS TEST  02/26/2010   EF 65%, SMALL AREA OF MILD INFARCT AND POSSIBLE PER-INFARCT ISCHEMIA IN THE INFEROBASAL WALL  . COLONOSCOPY    . CORONARY  ANGIOPLASTY     RIGHT CORONARY WERE UNSUCCESSFUL  . CORONARY ARTERY BYPASS GRAFT N/A 02/25/2016   Procedure: CORONARY ARTERY BYPASS GRAFTING (CABG) times four using the left internal mammary artery and the right saphenus vein ;  Surgeon: [name], MD;  Location: [place];  Service: Open Heart Surgery;  Laterality: N/A;  . HERNIA REPAIR  1960   right hernia repair  . INTRAOPERATIVE TRANSESOPHAGEAL ECHOCARDIOGRAM N/A 02/25/2016   Procedure: INTRAOPERATIVE TRANSESOPHAGEAL ECHOCARDIOGRAM;  Surgeon: [name], MD;  Location: [place];  Service: Open Heart Surgery;  Laterality: N/A;  . UMBILICAL HERNIA REPAIR    . US ECHOCARDIOGRAPHY  01/03/2007   EF 55-60%    There were no vitals filed for this visit.   Subjective Assessment - 03/18/19 1444    Subjective  Patient reports that he is an avid walker, walks about an hour a day.  He reports that he started having right low back pain and right hip pain about a month ago.  He did get an injection in the right hip 2 weeks ago and a prednisone pack, he reports that he is feeling better overall but still having some pain    Pertinent History  stroke, HTN, CABG    Limitations  Lifting;Standing;Walking;House hold activities    Patient Stated Goals  have  less pain, return to walking his normal    Currently in Pain?  Yes    Pain Score  3     Pain Location  Back    Pain Orientation  Right    Pain Descriptors / Indicators  Aching    Pain Type  Acute pain    Pain Radiating Towards  pain in the right hip area and into the right ITB and right calf    Pain Onset  1 to 4 weeks ago    Pain Frequency  Constant    Aggravating Factors   walking pain at times up to 8/10    Pain Relieving Factors  the injeciton and the prednisone really helped, at best pain a 3/10    Effect of Pain on Daily Activities  not walking much         [facility] PT Assessment - 03/18/19 0001      Assessment   Medical Diagnosis  lwo back pain and right hip pain    Referring  Provider (PT)  Beane    Onset Date/Surgical Date  02/25/19    Prior Therapy  over a year ago      Precautions   Precautions  None      Balance Screen   Has the patient fallen in the past 6 months  No    Has the patient had a decrease in activity level because of a fear of falling?   No    Is the patient reluctant to leave their home because of a fear of falling?   No      Home Environment   Additional Comments  does osme gardening and yardwork      Prior Function   Level of Independence  Independent    Vocation  Retired    Leisure  walks daily      Posture/Postural Control   Posture Comments  fwd head, decreased lordosis      ROM / Strength   AROM / PROM / Strength  AROM;Strength      AROM   Overall AROM Comments  Lumbar ROM decreased 25%      Strength   Overall Strength Comments  4+/5      Flexibility   Soft Tissue Assessment /Muscle Length  yes    Hamstrings  very tight    ITB  very tight    Piriformis  very tight      Palpation   Palpation comment  he is tender in the right buttock and SI area, tender in the right GT area                Objective measurements completed on examination: See above findings.      [facility] Adult PT Treatment/Exercise - 03/18/19 0001      Modalities   Modalities  Electrical Stimulation;Cryotherapy      Cryotherapy   Number Minutes Cryotherapy  15 Minutes    Cryotherapy Location  Lumbar Spine;Hip    Type of Cryotherapy  Ice pack      Electrical Stimulation   Electrical Stimulation Location  right lumbar area    Electrical Stimulation Action  IFC    Electrical Stimulation Parameters  supine    Electrical Stimulation Goals  Pain             PT Education - 03/18/19 1513    Education Details  HS and piriformis stretches    Person(s) Educated  Patient    Methods  Explanation;Demonstration;Handout  Comprehension  Verbalized understanding       PT Short Term Goals - 03/18/19 1516      PT SHORT TERM GOAL #1    Title  independent with initial HEP    Time  1    Period  Weeks    Status  New        PT Long Term Goals - 03/18/19 1517      PT LONG TERM GOAL #1   Title  understand proper posture and body mechanics    Time  8    Period  Weeks    Status  New      PT LONG TERM GOAL #2   Title  decrease pain 50%    Time  8    Period  Weeks    Status  New      PT LONG TERM GOAL #3   Title  return to his normal walking > 1 hour a day with minimal pain    Time  8    Period  Weeks    Status  New      PT LONG TERM GOAL #4   Title  increased Lumbar ROM 25%    Time  8    Period  Weeks    Status  New             Plan - 03/18/19 1514    Clinical Impression Statement  Patietn is an avid walker, walks more than an hour a day, reports that about a month ago started having bad back pain and right hip pain.  This persisted until an injection in the right GT area and a course of prednisone.  He continues to have pain in the right low back and right right hip and leg area.  He is very tight in the LE's, he is very tender in the right SI and buttock and the right GT area with some pain in the ITB and into the right calf    Personal Factors and Comorbidities  Comorbidity 3+    Comorbidities  HTN, CABG, stroke    Stability/Clinical Decision Making  Stable/Uncomplicated    Clinical Decision Making  Low    Rehab Potential  Good    PT Frequency  2x / week    PT Duration  8 weeks    PT Treatment/Interventions  ADLs/Self Care Home Management;Cryotherapy;Electrical Stimulation;Iontophoresis 4mg /ml Dexamethasone;Moist Heat;Traction;Ultrasound;Therapeutic activities;Therapeutic exercise;Balance training;Neuromuscular re-education;Manual techniques;Patient/family education;Dry needling    PT Next Visit Plan  may try traction    Consulted and Agree with Plan of Care  Patient       Patient will benefit from skilled therapeutic intervention in order to improve the following deficits and impairments:   Pain, Improper body mechanics, Increased muscle spasms, Postural dysfunction, Decreased range of motion, Decreased activity tolerance, Difficulty walking, Impaired flexibility  Visit Diagnosis: 1. Acute right-sided low back pain with right-sided sciatica   2. Pain in right hip   3. Muscle spasm of back        Problem List Patient Active Problem List   Diagnosis Date Noted  . Bradycardia 09/12/2018  . Plantar porokeratosis, acquired 05/16/2018  . CAD (coronary artery disease) 02/25/2016  . Abnormal stress ECG with treadmill 02/12/2016  . Colonic polyp 09/21/2015  . Left lower quadrant pain 09/03/2015  . Nasal congestion 09/03/2015  . Cellulitis of right lower extremity 05/14/2015  . Need for immunization against influenza 05/14/2015  . Bilateral low back pain with sciatica 09/03/2014  . Hip  pain, bilateral 09/03/2014  . Bilateral carotid artery stenosis 07/21/2014  . Myalgia 07/21/2014  . Acute pain of left knee 07/15/2014  . Benign prostatic hyperplasia with lower urinary tract symptoms 07/15/2014  . Effusion of left knee 07/15/2014  . History of elevated prostate specific antigen (PSA) 07/15/2014  . Hypertrophy of nasal turbinates 07/15/2014  . 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 Incomplete emptying of bladder 07/15/2014  . Increased urinary frequency 07/15/2014  . 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 Chest pain 09/05/2012  . Cough 08/06/2011  . Coronary artery disease involving native coronary artery without angina pectoris 04/26/2011  . Essential hypertension 04/26/2011  . Hyperlipidemia with target LDL less than 70 04/26/2011  . Cerebrovascular disease 04/26/2011    [name], PT 03/18/2019, 3:19 PM  [address] [place], [place], [address] Phone: [phone]   Fax:  [phone]  Name: Curtis Tyler MRN: [id number] Date of Birth: September 18, 1940

## 2019-03-20 ENCOUNTER — Encounter: Payer: Self-pay | Admitting: Physical Therapy

## 2019-03-20 ENCOUNTER — Ambulatory Visit: Payer: PPO | Admitting: Physical Therapy

## 2019-03-20 ENCOUNTER — Other Ambulatory Visit: Payer: Self-pay

## 2019-03-20 DIAGNOSIS — M25551 Pain in right hip: Secondary | ICD-10-CM

## 2019-03-20 DIAGNOSIS — M5441 Lumbago with sciatica, right side: Secondary | ICD-10-CM

## 2019-03-20 DIAGNOSIS — M62830 Muscle spasm of back: Secondary | ICD-10-CM

## 2019-03-20 NOTE — Therapy (Signed)
[place], [place], [address] Phone: [phone]   Fax:  [phone]  Physical Therapy Treatment  Patient Details  Name: Curtis Tyler MRN: [id number] Date of Birth: 14-Apr-1941 Referring Provider (PT): Beane   Encounter Date: 03/20/2019  PT End of Session - 03/20/19 1513    Visit Number  2    Date for PT Re-Evaluation  05/19/19    PT Start Time  2585    PT Stop Time  1530    PT Time Calculation (min)  45 min    Activity Tolerance  Patient tolerated treatment well    Behavior During Therapy  [facility] for tasks assessed/performed       Past Medical History:  Diagnosis Date  . Arthritis   . BPH (benign prostatic hyperplasia)   . Bunion   . Callus   . Carotid artery occlusion   . Coronary artery disease    left main 60% stenosis. The LAD had a proximal 60-70% stenosis. There was mid 40% stenosis. Diagonal is moderate size with 40% stenosis. The circumflex system included a large OM with 60% ostial stenosis the RCA was occluded before the PDA. The EF was 65%.  2014  . Hyperlipidemia   . Hypertension   . Myocardial infarct ([place]) 1992   inferior posterior  . Obesity   . Radiculopathy   . Shortness of breath dyspnea    with exertion -     Past Surgical History:  Procedure Laterality Date  . APPENDECTOMY  1958  . CARDIAC CATHETERIZATION  05/2004   SHOWING 100% OCCLUDED DISTAL RIGHT CORONARY WITH  LEFT  TO RIGHT COLLATERALS, AS WELL AS ANTEGRADE FLOW, NORMAL LEFT MAIN, 50% NARROWING IN LEFT CIRCUMFLEX  WITH IRREGULARITIES IN THE LAD  . CARDIAC CATHETERIZATION N/A 02/12/2016   Procedure: Left Heart Cath and Coronary Angiography;  Surgeon: Leonie Man, MD;  Location: [place] [facility];  Service: Cardiovascular;  Laterality: N/A;  . CARDIOVASCULAR STRESS TEST  02/26/2010   EF 65%, SMALL AREA OF MILD INFARCT AND POSSIBLE PER-INFARCT ISCHEMIA IN THE INFEROBASAL WALL  . COLONOSCOPY    . CORONARY  ANGIOPLASTY     RIGHT CORONARY WERE UNSUCCESSFUL  . CORONARY ARTERY BYPASS GRAFT N/A 02/25/2016   Procedure: CORONARY ARTERY BYPASS GRAFTING (CABG) times four using the left internal mammary artery and the right saphenus vein ;  Surgeon: [name], MD;  Location: [place];  Service: Open Heart Surgery;  Laterality: N/A;  . HERNIA REPAIR  1960   right hernia repair  . INTRAOPERATIVE TRANSESOPHAGEAL ECHOCARDIOGRAM N/A 02/25/2016   Procedure: INTRAOPERATIVE TRANSESOPHAGEAL ECHOCARDIOGRAM;  Surgeon: [name], MD;  Location: [place];  Service: Open Heart Surgery;  Laterality: N/A;  . UMBILICAL HERNIA REPAIR    . US ECHOCARDIOGRAPHY  01/03/2007   EF 55-60%    There were no vitals filed for this visit.  Subjective Assessment - 03/20/19 1450    Subjective  Reports no big changes but reports he needs help with the exercises    Currently in Pain?  Yes    Pain Score  3     Pain Location  Back    Aggravating Factors   walking                       [facility] Adult PT Treatment/Exercise - 03/20/19 0001      Exercises   Exercises  Lumbar      Lumbar  Exercises: Stretches   Passive Hamstring Stretch  Right;Left;4 reps;20 seconds    Lower Trunk Rotation  4 reps;20 seconds    ITB Stretch  Right;Left;3 reps;20 seconds    Piriformis Stretch  Right;Left;4 reps;20 seconds      Lumbar Exercises: Supine   Other Supine Lumbar Exercises  feet on ball K2C, rotation, bridge and isometric abs      Modalities   Modalities  Traction      Traction   Type of Traction  Lumbar    Min (lbs)  50    Max (lbs)  65    Hold Time  60    Rest Time  20    Time  15               PT Short Term Goals - 03/18/19 1516      PT SHORT TERM GOAL #1   Title  independent with initial HEP    Time  1    Period  Weeks    Status  New        PT Long Term Goals - 03/18/19 1517      PT LONG TERM GOAL #1   Title  understand proper posture and body mechanics    Time  8    Period   Weeks    Status  New      PT LONG TERM GOAL #2   Title  decrease pain 50%    Time  8    Period  Weeks    Status  New      PT LONG TERM GOAL #3   Title  return to his normal walking > 1 hour a day with minimal pain    Time  8    Period  Weeks    Status  New      PT LONG TERM GOAL #4   Title  increased Lumbar ROM 25%    Time  8    Period  Weeks    Status  New            Plan - 03/20/19 1514    Clinical Impression Statement  Patient is very tight in the lumbar and legs, he has difficulty relaxing and allowing motions.  I did have some difficulty with getting him to engage the core, his legs are tight so his ability to do the piriformis stretch at home is difficult    PT Next Visit Plan  see how the traction did    Consulted and Agree with Plan of Care  Patient       Patient will benefit from skilled therapeutic intervention in order to improve the following deficits and impairments:  Pain, Improper body mechanics, Increased muscle spasms, Postural dysfunction, Decreased range of motion, Decreased activity tolerance, Difficulty walking, Impaired flexibility  Visit Diagnosis: 1. Acute right-sided low back pain with right-sided sciatica   2. Pain in right hip   3. Muscle spasm of back        Problem List Patient Active Problem List   Diagnosis Date Noted  . Bradycardia 09/12/2018  . Plantar porokeratosis, acquired 05/16/2018  . CAD (coronary artery disease) 02/25/2016  . Abnormal stress ECG with treadmill 02/12/2016  . Colonic polyp 09/21/2015  . Left lower quadrant pain 09/03/2015  . Nasal congestion 09/03/2015  . Cellulitis of right lower extremity 05/14/2015  . Need for immunization against influenza 05/14/2015  . Bilateral low back pain with sciatica 09/03/2014  . Hip pain, bilateral 09/03/2014  .  Bilateral carotid artery stenosis 07/21/2014  . Myalgia 07/21/2014  . Acute pain of left knee 07/15/2014  . Benign prostatic hyperplasia with lower urinary tract  symptoms 07/15/2014  . Effusion of left knee 07/15/2014  . History of elevated prostate specific antigen (PSA) 07/15/2014  . Hypertrophy of nasal turbinates 07/15/2014  . 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 Incomplete emptying of bladder 07/15/2014  . Increased urinary frequency 07/15/2014  . 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 Chest pain 09/05/2012  . Cough 08/06/2011  . Coronary artery disease involving native coronary artery without angina pectoris 04/26/2011  . Essential hypertension 04/26/2011  . Hyperlipidemia with target LDL less than 70 04/26/2011  . Cerebrovascular disease 04/26/2011    [name], PT 03/20/2019, 3:24 PM  [address] [place], [place], [address] Phone: [phone]   Fax:  [phone]  Name: Curtis Tyler MRN: [id number] Date of Birth: [birth year]

## 2019-03-27 ENCOUNTER — Other Ambulatory Visit: Payer: Self-pay

## 2019-03-27 ENCOUNTER — Ambulatory Visit: Payer: PPO | Admitting: Physical Therapy

## 2019-03-27 ENCOUNTER — Encounter: Payer: Self-pay | Admitting: Physical Therapy

## 2019-03-27 DIAGNOSIS — M62830 Muscle spasm of back: Secondary | ICD-10-CM

## 2019-03-27 DIAGNOSIS — M25551 Pain in right hip: Secondary | ICD-10-CM

## 2019-03-27 DIAGNOSIS — M5441 Lumbago with sciatica, right side: Secondary | ICD-10-CM | POA: Diagnosis not present

## 2019-03-27 NOTE — Therapy (Signed)
[place], [place], [address] Phone: [phone]   Fax:  [phone]  Physical Therapy Treatment  Patient Details  Name: Curtis Tyler MRN: [id number] Date of Birth: 05-May-1941 Referring Provider (PT): Beane   Encounter Date: 03/27/2019  PT End of Session - 03/27/19 1601    Visit Number  3    Date for PT Re-Evaluation  05/19/19    PT Start Time  5956    PT Stop Time  1550    PT Time Calculation (min)  65 min    Activity Tolerance  Patient tolerated treatment well    Behavior During Therapy  [facility] for tasks assessed/performed       Past Medical History:  Diagnosis Date  . Arthritis   . BPH (benign prostatic hyperplasia)   . Bunion   . Callus   . Carotid artery occlusion   . Coronary artery disease    left main 60% stenosis. The LAD had a proximal 60-70% stenosis. There was mid 40% stenosis. Diagonal is moderate size with 40% stenosis. The circumflex system included a large OM with 60% ostial stenosis the RCA was occluded before the PDA. The EF was 65%.  2014  . Hyperlipidemia   . Hypertension   . Myocardial infarct ([place]) 1992   inferior posterior  . Obesity   . Radiculopathy   . Shortness of breath dyspnea    with exertion -     Past Surgical History:  Procedure Laterality Date  . APPENDECTOMY  1958  . CARDIAC CATHETERIZATION  05/2004   SHOWING 100% OCCLUDED DISTAL RIGHT CORONARY WITH  LEFT  TO RIGHT COLLATERALS, AS WELL AS ANTEGRADE FLOW, NORMAL LEFT MAIN, 50% NARROWING IN LEFT CIRCUMFLEX  WITH IRREGULARITIES IN THE LAD  . CARDIAC CATHETERIZATION N/A 02/12/2016   Procedure: Left Heart Cath and Coronary Angiography;  Surgeon: Leonie Man, MD;  Location: [place] [facility];  Service: Cardiovascular;  Laterality: N/A;  . CARDIOVASCULAR STRESS TEST  02/26/2010   EF 65%, SMALL AREA OF MILD INFARCT AND POSSIBLE PER-INFARCT ISCHEMIA IN THE INFEROBASAL WALL  . COLONOSCOPY    . CORONARY  ANGIOPLASTY     RIGHT CORONARY WERE UNSUCCESSFUL  . CORONARY ARTERY BYPASS GRAFT N/A 02/25/2016   Procedure: CORONARY ARTERY BYPASS GRAFTING (CABG) times four using the left internal mammary artery and the right saphenus vein ;  Surgeon: [name], MD;  Location: [place];  Service: Open Heart Surgery;  Laterality: N/A;  . HERNIA REPAIR  1960   right hernia repair  . INTRAOPERATIVE TRANSESOPHAGEAL ECHOCARDIOGRAM N/A 02/25/2016   Procedure: INTRAOPERATIVE TRANSESOPHAGEAL ECHOCARDIOGRAM;  Surgeon: [name], MD;  Location: [place];  Service: Open Heart Surgery;  Laterality: N/A;  . UMBILICAL HERNIA REPAIR    . US ECHOCARDIOGRAPHY  01/03/2007   EF 55-60%    There were no vitals filed for this visit.  Subjective Assessment - 03/27/19 1517    Subjective  I am walking some and at the end of walking I get the pain, pain in the right leg too    Currently in Pain?  Yes    Pain Score  3     Pain Location  Back    Pain Orientation  Right    Pain Descriptors / Indicators  Aching    Aggravating Factors   walking  [facility] Adult PT Treatment/Exercise - 03/27/19 0001      Cryotherapy   Cryotherapy Location  Lumbar Spine;Hip    Type of Cryotherapy  Ice pack      Electrical Stimulation   Electrical Stimulation Location  right lumbar area    Electrical Stimulation Action  IFC    Electrical Stimulation Parameters  supine    Electrical Stimulation Goals  Pain      Traction   Type of Traction  Lumbar    Min (lbs)  55    Max (lbs)  70    Hold Time  60    Rest Time  20    Time  15      Manual Therapy   Manual Therapy  Soft tissue mobilization    Manual therapy comments  PAROM stretches to the LE's and then trunk rotaiton    Soft tissue mobilization  to the right lumbar area, the right buttock                PT Short Term Goals - 03/18/19 1516      PT SHORT TERM GOAL #1   Title  independent with initial HEP    Time  1    Period   Weeks    Status  New        PT Long Term Goals - 03/27/19 1603      PT LONG TERM GOAL #1   Title  understand proper posture and body mechanics    Status  On-going      PT LONG TERM GOAL #2   Title  decrease pain 50%    Status  On-going      PT LONG TERM GOAL #3   Title  return to his normal walking > 1 hour a day with minimal pain    Status  On-going            Plan - 03/27/19 1602    Clinical Impression Statement  Patient continues to have tight LE's, his pain is mostly in the right low back and into the buttock as he walks he starts to get pain and tightness in the right lateral LE, the ITB is very tight.  Tried some STM today    PT Next Visit Plan  add as needed    Consulted and Agree with Plan of Care  Patient       Patient will benefit from skilled therapeutic intervention in order to improve the following deficits and impairments:  Pain, Improper body mechanics, Increased muscle spasms, Postural dysfunction, Decreased range of motion, Decreased activity tolerance, Difficulty walking, Impaired flexibility  Visit Diagnosis: 1. Acute right-sided low back pain with right-sided sciatica   2. Pain in right hip   3. Muscle spasm of back        Problem List Patient Active Problem List   Diagnosis Date Noted  . Bradycardia 09/12/2018  . Plantar porokeratosis, acquired 05/16/2018  . CAD (coronary artery disease) 02/25/2016  . Abnormal stress ECG with treadmill 02/12/2016  . Colonic polyp 09/21/2015  . Left lower quadrant pain 09/03/2015  . Nasal congestion 09/03/2015  . Cellulitis of right lower extremity 05/14/2015  . Need for immunization against influenza 05/14/2015  . Bilateral low back pain with sciatica 09/03/2014  . Hip pain, bilateral 09/03/2014  . Bilateral carotid artery stenosis 07/21/2014  . Myalgia 07/21/2014  . Acute pain of left knee 07/15/2014  . Benign prostatic hyperplasia with lower urinary tract symptoms 07/15/2014  . Effusion of  left  knee 07/15/2014  . History of elevated prostate specific antigen (PSA) 07/15/2014  . Hypertrophy of nasal turbinates 07/15/2014  . 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 Incomplete emptying of bladder 07/15/2014  . Increased urinary frequency 07/15/2014  . 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 Chest pain 09/05/2012  . Cough 08/06/2011  . Coronary artery disease involving native coronary artery without angina pectoris 04/26/2011  . Essential hypertension 04/26/2011  . Hyperlipidemia with target LDL less than 70 04/26/2011  . Cerebrovascular disease 04/26/2011    [name], PT 03/27/2019, 4:04 PM  [address] [place], [place], [address] Phone: [phone]   Fax:  [phone]  Name: Keegen Heffern MRN: [id number] Date of Birth: [birth year]

## 2019-04-03 ENCOUNTER — Encounter: Payer: Self-pay | Admitting: Physical Therapy

## 2019-04-03 ENCOUNTER — Ambulatory Visit: Payer: PPO | Attending: Specialist | Admitting: Physical Therapy

## 2019-04-03 ENCOUNTER — Other Ambulatory Visit: Payer: Self-pay

## 2019-04-03 DIAGNOSIS — M62830 Muscle spasm of back: Secondary | ICD-10-CM | POA: Diagnosis not present

## 2019-04-03 DIAGNOSIS — M5441 Lumbago with sciatica, right side: Secondary | ICD-10-CM | POA: Diagnosis not present

## 2019-04-03 DIAGNOSIS — M25551 Pain in right hip: Secondary | ICD-10-CM | POA: Diagnosis not present

## 2019-04-03 NOTE — Therapy (Signed)
[facility] [place], [place], [address] Phone: [phone]   Fax:  [phone]  Physical Therapy Treatment  Patient Details  Name: Curtis Tyler MRN: [id number] Date of Birth: 1941-02-04 Referring Provider (PT): Beane   Encounter Date: 04/03/2019  PT End of Session - 04/03/19 1345    Visit Number  4    Date for PT Re-Evaluation  05/19/19    PT Start Time  1315    PT Stop Time  1405    PT Time Calculation (min)  50 min    Activity Tolerance  Patient tolerated treatment well    Behavior During Therapy  [facility] for tasks assessed/performed       Past Medical History:  Diagnosis Date  . Arthritis   . BPH (benign prostatic hyperplasia)   . Bunion   . Callus   . Carotid artery occlusion   . Coronary artery disease    left main 60% stenosis. The LAD had a proximal 60-70% stenosis. There was mid 40% stenosis. Diagonal is moderate size with 40% stenosis. The circumflex system included a large OM with 60% ostial stenosis the RCA was occluded before the PDA. The EF was 65%.  2014  . Hyperlipidemia   . Hypertension   . Myocardial infarct ([place]) 1992   inferior posterior  . Obesity   . Radiculopathy   . Shortness of breath dyspnea    with exertion -     Past Surgical History:  Procedure Laterality Date  . APPENDECTOMY  1958  . CARDIAC CATHETERIZATION  05/2004   SHOWING 100% OCCLUDED DISTAL RIGHT CORONARY WITH  LEFT  TO RIGHT COLLATERALS, AS WELL AS ANTEGRADE FLOW, NORMAL LEFT MAIN, 50% NARROWING IN LEFT CIRCUMFLEX  WITH IRREGULARITIES IN THE LAD  . CARDIAC CATHETERIZATION N/A 02/12/2016   Procedure: Left Heart Cath and Coronary Angiography;  Surgeon: Leonie Man, MD;  Location: [place] [facility];  Service: Cardiovascular;  Laterality: N/A;  . CARDIOVASCULAR STRESS TEST  02/26/2010   EF 65%, SMALL AREA OF MILD INFARCT AND POSSIBLE PER-INFARCT ISCHEMIA IN THE INFEROBASAL WALL  . COLONOSCOPY    . CORONARY  ANGIOPLASTY     RIGHT CORONARY WERE UNSUCCESSFUL  . CORONARY ARTERY BYPASS GRAFT N/A 02/25/2016   Procedure: CORONARY ARTERY BYPASS GRAFTING (CABG) times four using the left internal mammary artery and the right saphenus vein ;  Surgeon: [name], MD;  Location: [place];  Service: Open Heart Surgery;  Laterality: N/A;  . HERNIA REPAIR  1960   right hernia repair  . INTRAOPERATIVE TRANSESOPHAGEAL ECHOCARDIOGRAM N/A 02/25/2016   Procedure: INTRAOPERATIVE TRANSESOPHAGEAL ECHOCARDIOGRAM;  Surgeon: [name], MD;  Location: [place];  Service: Open Heart Surgery;  Laterality: N/A;  . UMBILICAL HERNIA REPAIR    . US ECHOCARDIOGRAPHY  01/03/2007   EF 55-60%    There were no vitals filed for this visit.  Subjective Assessment - 04/03/19 1344    Subjective  Patient reports that he is feeling good, reports today was his best walk in a long time    Currently in Pain?  Yes    Pain Score  2     Pain Location  Back    Pain Orientation  Right;Lower    Pain Relieving Factors  the treatment has really helped                       [facility] Adult PT Treatment/Exercise - 04/03/19 0001  Acupuncturist Location  right lumbar area    Chartered certified accountant  [employer]    Electrical Stimulation Parameters  supine    Electrical Stimulation Goals  Pain      Traction   Type of Traction  Lumbar    Min (lbs)  55    Max (lbs)  70    Hold Time  60    Rest Time  20    Time  15      Manual Therapy   Manual Therapy  Soft tissue mobilization    Manual therapy comments  PROM to the LE's    Soft tissue mobilization  to the right lumbar area, the right buttock                PT Short Term Goals - 04/03/19 1346      PT SHORT TERM GOAL #1   Title  independent with initial HEP    Status  Achieved        PT Long Term Goals - 04/03/19 1347      PT LONG TERM GOAL #1   Title  understand proper posture and body mechanics    Status   On-going      PT LONG TERM GOAL #2   Title  decrease pain 50%    Status  Partially Met      PT LONG TERM GOAL #3   Title  return to his normal walking > 1 hour a day with minimal pain    Status  Partially Met            Plan - 04/03/19 1346    Clinical Impression Statement  Patient still very tender in the right lumbar and SI area, he is tight in the L:E's, he reports that he is walking much better and having less pain overall.    PT Next Visit Plan  core strength    Consulted and Agree with Plan of Care  Patient       Patient will benefit from skilled therapeutic intervention in order to improve the following deficits and impairments:  Pain, Improper body mechanics, Increased muscle spasms, Postural dysfunction, Decreased range of motion, Decreased activity tolerance, Difficulty walking, Impaired flexibility  Visit Diagnosis: 1. Acute right-sided low back pain with right-sided sciatica   2. Pain in right hip   3. Muscle spasm of back        Problem List Patient Active Problem List   Diagnosis Date Noted  . Bradycardia 09/12/2018  . Plantar porokeratosis, acquired 05/16/2018  . CAD (coronary artery disease) 02/25/2016  . Abnormal stress ECG with treadmill 02/12/2016  . Colonic polyp 09/21/2015  . Left lower quadrant pain 09/03/2015  . Nasal congestion 09/03/2015  . Cellulitis of right lower extremity 05/14/2015  . Need for immunization against influenza 05/14/2015  . Bilateral low back pain with sciatica 09/03/2014  . Hip pain, bilateral 09/03/2014  . Bilateral carotid artery stenosis 07/21/2014  . Myalgia 07/21/2014  . Acute pain of left knee 07/15/2014  . Benign prostatic hyperplasia with lower urinary tract symptoms 07/15/2014  . Effusion of left knee 07/15/2014  . History of elevated prostate specific antigen (PSA) 07/15/2014  . Hypertrophy of nasal turbinates 07/15/2014  . 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 Incomplete emptying of bladder 07/15/2014  .  Increased urinary frequency 07/15/2014  . 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 Chest pain 09/05/2012  . Cough 08/06/2011  . Coronary artery disease involving native coronary artery without  angina pectoris 04/26/2011  . Essential hypertension 04/26/2011  . Hyperlipidemia with target LDL less than 70 04/26/2011  . Cerebrovascular disease 04/26/2011    [name], PT 04/03/2019, 1:49 PM  [place] [address] [address], [place], [address] Phone: [phone]   Fax:  [phone]  Name: Curtis Tyler MRN: [id number] Date of Birth: 1941-08-16

## 2019-04-09 ENCOUNTER — Ambulatory Visit: Payer: PPO | Admitting: Physical Therapy

## 2019-04-09 ENCOUNTER — Other Ambulatory Visit: Payer: Self-pay

## 2019-04-09 DIAGNOSIS — Z20822 Contact with and (suspected) exposure to covid-19: Secondary | ICD-10-CM

## 2019-04-11 LAB — NOVEL CORONAVIRUS, NAA: SARS-CoV-2, NAA: NOT DETECTED

## 2019-04-16 ENCOUNTER — Ambulatory Visit: Payer: PPO | Admitting: Physical Therapy

## 2019-05-01 NOTE — Progress Notes (Signed)
HPI The patient presents for follow up of CAD.  He had a cardiac cath in 2014 demonstrating left main 60% stenosis. The LAD had a proximal 60-70% stenosis. There was mid 40% stenosis. Diagonal is moderate size with 40% stenosis. The circumflex system included a large OM with 60% ostial stenosis the RCA was occluded before the PDA. The EF was 65%. However, we decided to manage him medically unless he had worsening symptoms. In May 2017 I sent him for a screening POET (Plain Old Exercise Treadmill).  This was abnormal with ST depression and was a high risk study. He then underwent cardiac catheterization where he was found to have a 60% ostial left main, a 90% proximal LAD, 90% proximal circumflex, and a diffusely diseased right coronary with a chronically totally occluded posterior descending.  He was taken the operating room on 02/25/2016 for CABG x 4 by Dr. Roxan Hockey.   He had bradycardia and I stopped his beta blocker.  A Holter demonstrated sinus with Mobitz Type 1.  He did have brief runs of 2:1 block without symptoms.  There were no sustained pauses.    Since I last saw him he has done well.  He is walking routinely.  His heart rate goes a little bit fast when he starts walking but calms down.  He is not having any slow heart rhythms and has had no presyncope or syncope.  He denies any chest pressure, neck or arm discomfort.  He has no new shortness of breath, PND or orthopnea.  Of note he did not have significant symptoms prior to his bypass either.   No Known Allergies  Current Outpatient Medications  Medication Sig Dispense Refill  . amLODipine (NORVASC) 5 MG tablet Take 1 tablet (5 mg total) by mouth daily. 90 tablet 2  . aspirin 81 MG tablet Take 81 mg by mouth daily.      . CRESTOR 10 MG tablet Take 10 mg by mouth every other day.     . doxazosin (CARDURA) 4 MG tablet Take 4 mg by mouth at bedtime.      . finasteride (PROSCAR) 5 MG tablet Take 5 mg by mouth daily.      . fish  oil-omega-3 fatty acids 1000 MG capsule Take 1 g by mouth daily.      Marland Kitchen gabapentin (NEURONTIN) 300 MG capsule Take 300 mg by mouth at bedtime.    Marland Kitchen latanoprost (XALATAN) 0.005 % ophthalmic solution Place 1 drop into both eyes daily.    Marland Kitchen losartan (COZAAR) 100 MG tablet Take 100 mg by mouth daily.      . Multiple Vitamins-Minerals (MULTIVITAMIN WITH MINERALS) tablet Take 1 tablet by mouth daily.    . Plant Sterols and Stanols (CHOLEST OFF) 450 MG TABS Take 900 mg by mouth daily.     Marland Kitchen ezetimibe (ZETIA) 10 MG tablet Take 10 mg by mouth daily.     No current facility-administered medications for this visit.     Past Medical History:  Diagnosis Date  . Arthritis   . BPH (benign prostatic hyperplasia)   . Bunion   . Callus   . Carotid artery occlusion   . Coronary artery disease    left main 60% stenosis. The LAD had a proximal 60-70% stenosis. There was mid 40% stenosis. Diagonal is moderate size with 40% stenosis. The circumflex system included a large OM with 60% ostial stenosis the RCA was occluded before the PDA. The EF was 65%.  2014  .  Hyperlipidemia   . Hypertension   . Myocardial infarct ([place]) 1992   inferior posterior  . Obesity   . Radiculopathy   . Shortness of breath dyspnea    with exertion -     Past Surgical History:  Procedure Laterality Date  . APPENDECTOMY  1958  . CARDIAC CATHETERIZATION  05/2004   SHOWING 100% OCCLUDED DISTAL RIGHT CORONARY WITH  LEFT  TO RIGHT COLLATERALS, AS WELL AS ANTEGRADE FLOW, NORMAL LEFT MAIN, 50% NARROWING IN LEFT CIRCUMFLEX  WITH IRREGULARITIES IN THE LAD  . CARDIAC CATHETERIZATION N/A 02/12/2016   Procedure: Left Heart Cath and Coronary Angiography;  Surgeon: Leonie Man, MD;  Location: [place] [facility];  Service: Cardiovascular;  Laterality: N/A;  . CARDIOVASCULAR STRESS TEST  02/26/2010   EF 65%, SMALL AREA OF MILD INFARCT AND POSSIBLE PER-INFARCT ISCHEMIA IN THE INFEROBASAL WALL  . COLONOSCOPY    . CORONARY ANGIOPLASTY      RIGHT CORONARY WERE UNSUCCESSFUL  . CORONARY ARTERY BYPASS GRAFT N/A 02/25/2016   Procedure: CORONARY ARTERY BYPASS GRAFTING (CABG) times four using the left internal mammary artery and the right saphenus vein ;  Surgeon: [name], MD;  Location: [facility];  Service: Open Heart Surgery;  Laterality: N/A;  . HERNIA REPAIR  1960   right hernia repair  . INTRAOPERATIVE TRANSESOPHAGEAL ECHOCARDIOGRAM N/A 02/25/2016   Procedure: INTRAOPERATIVE TRANSESOPHAGEAL ECHOCARDIOGRAM;  Surgeon: [name], MD;  Location: [facility];  Service: Open Heart Surgery;  Laterality: N/A;  . UMBILICAL HERNIA REPAIR    . US ECHOCARDIOGRAPHY  01/03/2007   EF 55-60%    ROS:   As stated in the HPI and negative for all other systems.  PHYSICAL EXAM BP 137/60   Pulse 95   Ht 5' 2.5" (1.588 m)   Wt 183 lb (83 kg)   SpO2 (!) 77%   BMI 32.94 kg/m   GENERAL:  Well appearing NECK:  No jugular venous distention, waveform within normal limits, carotid upstroke brisk and symmetric, no bruits, no thyromegaly LUNGS:  Clear to auscultation bilaterally CHEST:  Well healed sternotomy scar HEART:  PMI not displaced or sustained,S1 and S2 within normal limits, no S3, no S4, no clicks, no rubs, no murmurs ABD:  Flat, positive bowel sounds normal in frequency in pitch, no bruits, no rebound, no guarding, no midline pulsatile mass, no hepatomegaly, no splenomegaly EXT:  2 plus pulses throughout, no edema, no cyanosis no clubbing  EKG:    NA  ASSESSMENT AND PLAN   BRADYCARDIA:    He has had no new symptoms consistent with bradycardic rhythm.  No change in therapy.   CAD s/p CABG:   The patient has no new sypmtoms.  No further cardiovascular testing is indicated.  We will continue with aggressive risk reduction and meds as listed.  HTN:   The blood pressure is at target.  No change in therapy.   HYPERLIPIDEMIA:  I will call his PCP to get the latest lipid profile.    CAROTID STENOSIS:    He had mild plaque in the  past.  No change in therapy.  No change in therapy.  No further testing.

## 2019-05-03 ENCOUNTER — Ambulatory Visit (INDEPENDENT_AMBULATORY_CARE_PROVIDER_SITE_OTHER): Payer: PPO | Admitting: Cardiology

## 2019-05-03 ENCOUNTER — Other Ambulatory Visit: Payer: Self-pay

## 2019-05-03 ENCOUNTER — Encounter: Payer: Self-pay | Admitting: Cardiology

## 2019-05-03 VITALS — BP 137/60 | HR 95 | Ht 62.5 in | Wt 183.0 lb

## 2019-05-03 DIAGNOSIS — R001 Bradycardia, unspecified: Secondary | ICD-10-CM

## 2019-05-03 DIAGNOSIS — I2510 Atherosclerotic heart disease of native coronary artery without angina pectoris: Secondary | ICD-10-CM | POA: Diagnosis not present

## 2019-05-03 DIAGNOSIS — I10 Essential (primary) hypertension: Secondary | ICD-10-CM | POA: Diagnosis not present

## 2019-05-03 DIAGNOSIS — E785 Hyperlipidemia, unspecified: Secondary | ICD-10-CM | POA: Diagnosis not present

## 2019-05-03 NOTE — Patient Instructions (Signed)

## 2019-05-07 ENCOUNTER — Telehealth: Payer: Self-pay

## 2019-05-07 NOTE — Telephone Encounter (Signed)
-----   Message from [name], MD sent at 05/04/2019  5:03 PM EDT ----- Elease Etienne- can you please call this person and set him up for a new patient visit  Thank you

## 2019-05-07 NOTE — Telephone Encounter (Signed)
Kirsten, can you please set this patient up for a new patient appointment with dr. Lorelei Pont?

## 2019-05-08 NOTE — Telephone Encounter (Signed)
Appt schedule 9/21

## 2019-05-13 ENCOUNTER — Ambulatory Visit: Payer: PPO | Admitting: Family Medicine

## 2019-05-13 DIAGNOSIS — K219 Gastro-esophageal reflux disease without esophagitis: Secondary | ICD-10-CM | POA: Diagnosis not present

## 2019-05-15 NOTE — Progress Notes (Addendum)
[facility] [address], [place], [place] [address] [phone]  Date:  05/20/2019   Name:  Curtis Tyler   DOB:  May 21, 1941   MRN:  [id number]  PCP:  Curtis Peers, MD    Chief Complaint: Annual Exam   History of Present Illness:  Curtis Tyler is a 78 y.o. very pleasant male patient who presents with the following:  New patient here today to establish care- sent to me on request of his cardiologist  Cardiologist is Dr. Percival Spanish History of CAD with cardiac cath in 2014.  He was medically managed until 2017, when he had an abnormal treadmill test.  This led to CABG x 4 in 2017 He saw his cardiologist Dr. Percival Spanish earlier this month: The patient presents for follow up of CAD.  He had a cardiac cath in 2014 demonstrating left main 60% stenosis. The LAD had a proximal 60-70% stenosis. There was mid 40% stenosis. Diagonal is moderate size with 40% stenosis. The circumflex system included a large OM with 60% ostial stenosis the RCA was occluded before the PDA. The EF was 65%. However, we decided to manage him medically unless he had worsening symptoms. In May 2017 I sent him for a screening POET (Plain Old Exercise Treadmill).  This was abnormal with ST depression and was a high risk study. He then underwent cardiac catheterization where he was found to have a 60% ostial left main, a 90% proximal LAD, 90% proximal circumflex, and a diffusely diseased right coronary with a chronically totally occluded posterior descending.  He was taken the operating room on 02/25/2016 for CABG x 4 by Dr. Roxan Hockey.  He had bradycardia and I stopped his beta blocker.  A Holter demonstrated sinus with Mobitz Type 1.  He did have brief runs of 2:1 block without symptoms.  There were no sustained pauses.    Flu: pt prefers to do later on this month  Other routine shots: done at [facility] pharmacy,will request records  Labs:  Appear to be due- will do  today.  He is fasting  Per outside records he had a Colon in 2012 per Dr Penelope Coop with [facility] .  Pt states he had a colon 2 years ago, I will request these records as well  He thinks that he had pneumovax and shingles vaccine- I did find record of one shingles vaccine so far   Received records from his past PCP at [facility], Dr Ruben Gottron- pt states that he likes the doctor but their office was difficult to work with so he would like to move to [place] so all records will be in one place   He was born in [place] and moved to the [place] at age 52, to [place] He has moved around since, settled in [place] for 25 years He is married, his wife might want to see me as well.  Both he and his wife are widowed, this is 2nd marriage for both of them  They have 4 children- 2 each from previous marriage 5 grands- they are dispersed across the [place]. Their 26 yo granddaughter does live in [place], 2 in [place] and 2 in [place] He is retired form his work- he was with a company for 50 years.  He did logistics - warehouse and distribution  In his fee time he enjoys cooking, golf, travel, movies  He has not been golfing this year- his wife has some health issues and he  worries some about getting her sick  He was having some recurrent GERD sx recently- did a virtual visit with Dr Penelope Coop one week ago who recommended an OTC ?PPI.  He has started on this this and today is feeling pretty good He mostly has sx in the am, tries to avoid trigger foods GERD is not new to him No CP or SOB- he walks for exercise and does not have pain Walks about one hour a day   His urologist is Dr Estill Dooms, he checks his PSA annually. They are managing his BPH with both doxazosin and finasteride  He is taking gabapentin 300 at bedtime for neuropathy- this helps him with nighttime pain in his legs   He takes crestor 3x a week and zetia- he can tolerate this and his lipids have been ok  Daily statin caused aches   Patient Active Problem List   Diagnosis Date Noted  . Dyslipidemia  05/03/2019  . Bradycardia 09/12/2018  . Plantar porokeratosis, acquired 05/16/2018  . CAD (coronary artery disease) 02/25/2016  . Abnormal stress ECG with treadmill 02/12/2016  . Colonic polyp 09/21/2015  . Left lower quadrant pain 09/03/2015  . Nasal congestion 09/03/2015  . Cellulitis of right lower extremity 05/14/2015  . Need for immunization against influenza 05/14/2015  . Bilateral low back pain with sciatica 09/03/2014  . Hip pain, bilateral 09/03/2014  . Bilateral carotid artery stenosis 07/21/2014  . Myalgia 07/21/2014  . Acute pain of left knee 07/15/2014  . Benign prostatic hyperplasia with lower urinary tract symptoms 07/15/2014  . Effusion of left knee 07/15/2014  . History of elevated prostate specific antigen (PSA) 07/15/2014  . Hypertrophy of nasal turbinates 07/15/2014  . 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 Incomplete emptying of bladder 07/15/2014  . Increased urinary frequency 07/15/2014  . 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 Chest pain 09/05/2012  . Cough 08/06/2011  . Coronary artery disease involving native coronary artery without angina pectoris 04/26/2011  . Essential hypertension 04/26/2011  . Hyperlipidemia with target LDL less than 70 04/26/2011  . Cerebrovascular disease 04/26/2011    Past Medical History:  Diagnosis Date  . Arthritis   . BPH (benign prostatic hyperplasia)   . Bunion   . Callus   . Carotid artery occlusion   . Coronary artery disease    left main 60% stenosis. The LAD had a proximal 60-70% stenosis. There was mid 40% stenosis. Diagonal is moderate size with 40% stenosis. The circumflex system included a large OM with 60% ostial stenosis the RCA was occluded before the PDA. The EF was 65%.  2014  . Hyperlipidemia   . Hypertension   . Myocardial infarct ([place]) 1992   inferior posterior  . Obesity   . Radiculopathy   . Shortness of breath dyspnea    with exertion -     Past Surgical History:  Procedure Laterality Date   . APPENDECTOMY  1958  . CARDIAC CATHETERIZATION  05/2004   SHOWING 100% OCCLUDED DISTAL RIGHT CORONARY WITH  LEFT  TO RIGHT COLLATERALS, AS WELL AS ANTEGRADE FLOW, NORMAL LEFT MAIN, 50% NARROWING IN LEFT CIRCUMFLEX  WITH IRREGULARITIES IN THE LAD  . CARDIAC CATHETERIZATION N/A 02/12/2016   Procedure: Left Heart Cath and Coronary Angiography;  Surgeon: Leonie Man, MD;  Location: [facility];  Service: Cardiovascular;  Laterality: N/A;  . CARDIOVASCULAR STRESS TEST  02/26/2010   EF 65%, SMALL AREA OF MILD INFARCT AND POSSIBLE PER-INFARCT ISCHEMIA IN THE INFEROBASAL WALL  . COLONOSCOPY    .  CORONARY ANGIOPLASTY     RIGHT CORONARY WERE UNSUCCESSFUL  . CORONARY ARTERY BYPASS GRAFT N/A 02/25/2016   Procedure: CORONARY ARTERY BYPASS GRAFTING (CABG) times four using the left internal mammary artery and the right saphenus vein ;  Surgeon: [name], MD;  Location: [place];  Service: Open Heart Surgery;  Laterality: N/A;  . HERNIA REPAIR  1960   right hernia repair  . INTRAOPERATIVE TRANSESOPHAGEAL ECHOCARDIOGRAM N/A 02/25/2016   Procedure: INTRAOPERATIVE TRANSESOPHAGEAL ECHOCARDIOGRAM;  Surgeon: [name], MD;  Location: [place];  Service: Open Heart Surgery;  Laterality: N/A;  . UMBILICAL HERNIA REPAIR    . US ECHOCARDIOGRAPHY  01/03/2007   EF 55-60%    Social History   Tobacco Use  . Smoking status: Former Smoker    Packs/day: 1.00    Years: 25.00    Pack years: 25.00    Types: Cigarettes    Quit date: 07/23/1981    Years since quitting: 37.8  . Smokeless tobacco: Never Used  Substance Use Topics  . Alcohol use: Yes    Alcohol/week: 4.0 standard drinks    Types: 3 Glasses of wine, 1 Cans of beer per week  . Drug use: No    Family History  Problem Relation Age of Onset  . Stroke Father   . Congestive Heart Failure Mother   . Diabetes Mother   . Hypertension Neg Hx     No Known Allergies  Medication list has been reviewed and updated.  Current  Outpatient Medications on File Prior to Visit  Medication Sig Dispense Refill  . amLODipine (NORVASC) 5 MG tablet Take 1 tablet (5 mg total) by mouth daily. 90 tablet 2  . aspirin 81 MG tablet Take 81 mg by mouth daily.      . CRESTOR 10 MG tablet Take 10 mg by mouth every other day.     . doxazosin (CARDURA) 4 MG tablet Take 4 mg by mouth at bedtime.      . finasteride (PROSCAR) 5 MG tablet Take 5 mg by mouth daily.      . fish oil-omega-3 fatty acids 1000 MG capsule Take 1 g by mouth daily.      Marland Kitchen gabapentin (NEURONTIN) 300 MG capsule Take 300 mg by mouth at bedtime.    . Glucosamine-Chondroitin-MSM 500-200-150 MG TABS Take by mouth.    . latanoprost (XALATAN) 0.005 % ophthalmic solution Place 1 drop into both eyes daily.    Marland Kitchen losartan (COZAAR) 100 MG tablet Take 100 mg by mouth daily.      . Multiple Vitamins-Minerals (MULTIVITAMIN WITH MINERALS) tablet Take 1 tablet by mouth daily.    . Plant Sterols and Stanols (CHOLEST OFF) 450 MG TABS Take 900 mg by mouth daily.     Marland Kitchen ezetimibe (ZETIA) 10 MG tablet Take 10 mg by mouth daily.     No current facility-administered medications on file prior to visit.     Review of Systems:  As per HPI- otherwise negative. No fever or chills He denies any CP, chest pressure or SOB   Physical Examination: Vitals:   05/20/19 0951  BP: 122/80  Pulse: 75  Resp: 16  Temp: (!) 96.5 F (35.8 C)  SpO2: 98%   Vitals:   05/20/19 0951  Weight: 179 lb (81.2 kg)  Height: 5' 2.5" (1.588 m)   Body mass index is 32.22 kg/m. Ideal Body Weight: Weight in (lb) to have BMI = 25: 138.6  GEN: WDWN, NAD, Non-toxic, A & O x  3, obese, looks wel  HEENT: Atraumatic, Normocephalic. Neck supple. No masses, No LAD. Ears and Nose: No external deformity. CV: RRR, No M/G/R. No JVD. No thrill. No extra heart sounds. PULM: CTA B, no wheezes, crackles, rhonchi. No retractions. No resp. distress. No accessory muscle use. ABD: S, NT, ND, +BS. No rebound. No HSM. EXTR:  No c/c/e NEURO Normal gait.  PSYCH: Normally interactive. Conversant. Not depressed or anxious appearing.  Calm demeanor.    Assessment and Plan: Essential hypertension - Plan: CBC, Comprehensive metabolic panel  Coronary artery disease involving native coronary artery of native heart without angina pectoris - Plan: Comprehensive metabolic panel  Hyperlipidemia with target LDL less than 70 - Plan: Lipid panel  Pre-diabetes - Plan: Hemoglobin A1c  Establishing care with new doctor, encounter for  Here today to establish care Went over his outside records and cardiology records Requested his last colon report and immun He will do flu shot later Routine labs today He is seeing his GI for GERD sx and taking an OTC PPI for a couple of weeks He will let me know if anything else needed in this regard  Will plan further follow- up pending labs.   Signed Lamar Blinks, MD  Received his labs, message to pt Last A1c in 12/19 5.8%  Results for orders placed or performed in visit on 05/20/19  CBC  Result Value Ref Range   WBC 5.4 4.0 - 10.5 K/uL   RBC 5.21 4.22 - 5.81 Mil/uL   Platelets 156.0 150.0 - 400.0 K/uL   Hemoglobin 16.8 13.0 - 17.0 g/dL   HCT 49.1 39.0 - 52.0 %   MCV 94.2 78.0 - 100.0 fl   MCHC 34.2 30.0 - 36.0 g/dL   RDW 12.7 11.5 - 15.5 %  Comprehensive metabolic panel  Result Value Ref Range   Sodium 136 135 - 145 mEq/L   Potassium 4.1 3.5 - 5.1 mEq/L   Chloride 100 96 - 112 mEq/L   CO2 28 19 - 32 mEq/L   Glucose, Bld 123 (H) 70 - 99 mg/dL   BUN 18 6 - 23 mg/dL   Creatinine, Ser 0.98 0.40 - 1.50 mg/dL   Total Bilirubin 1.4 (H) 0.2 - 1.2 mg/dL   Alkaline Phosphatase 74 39 - 117 U/L   AST 19 0 - 37 U/L   ALT 20 0 - 53 U/L   Total Protein 7.1 6.0 - 8.3 g/dL   Albumin 4.4 3.5 - 5.2 g/dL   Calcium 9.5 8.4 - 10.5 mg/dL   GFR 73.48 >60.00 mL/min  Hemoglobin A1c  Result Value Ref Range   Hgb A1c MFr Bld 6.1 4.6 - 6.5 %  Lipid panel  Result Value Ref Range    Cholesterol 195 0 - 200 mg/dL   Triglycerides 170.0 (H) 0.0 - 149.0 mg/dL   HDL 53.00 >39.00 mg/dL   VLDL 34.0 0.0 - 40.0 mg/dL   LDL Cholesterol 108 (H) 0 - 99 mg/dL   Total CHOL/HDL Ratio 4    NonHDL 141.64    Will send lipids to his cardiologist History of elevated bilirubin    Blood counts are normal Metabolic profile is normal except for elevated bilirubin.  Looking back this has been present in the past as well.  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  Has anyone ever mentioned this to you? I will see if our lab can add on a further bilirubin test to try and confirm this.  Your A1c- average blood sugar- is stable in the PRE- diabetes range.  Continue to monitor Your lipids are acceptable- will send to Dr Percival Spanish in case he wants to make any changes  Otherwise please see me in about 6 months and take care!  9/23 Heard back from Dr Percival Spanish regarding his lipids: I would suggest increasing the Crestor to 20 mg and repeating lipid and liver profiles in 10 weeks.  Sent message to patient, asked him to try taking 20 mg of Crestor a few times and let me know how it works-he has history of aches with higher dose of statin

## 2019-05-20 ENCOUNTER — Other Ambulatory Visit: Payer: PPO

## 2019-05-20 ENCOUNTER — Other Ambulatory Visit: Payer: Self-pay

## 2019-05-20 ENCOUNTER — Encounter: Payer: Self-pay | Admitting: Family Medicine

## 2019-05-20 ENCOUNTER — Ambulatory Visit (INDEPENDENT_AMBULATORY_CARE_PROVIDER_SITE_OTHER): Payer: PPO | Admitting: Family Medicine

## 2019-05-20 ENCOUNTER — Telehealth: Payer: Self-pay | Admitting: *Deleted

## 2019-05-20 VITALS — BP 122/80 | HR 75 | Temp 96.5°F | Resp 16 | Ht 62.5 in | Wt 179.0 lb

## 2019-05-20 DIAGNOSIS — R7303 Prediabetes: Secondary | ICD-10-CM

## 2019-05-20 DIAGNOSIS — E785 Hyperlipidemia, unspecified: Secondary | ICD-10-CM

## 2019-05-20 DIAGNOSIS — Z7689 Persons encountering health services in other specified circumstances: Secondary | ICD-10-CM

## 2019-05-20 DIAGNOSIS — I10 Essential (primary) hypertension: Secondary | ICD-10-CM | POA: Diagnosis not present

## 2019-05-20 DIAGNOSIS — R17 Unspecified jaundice: Secondary | ICD-10-CM

## 2019-05-20 DIAGNOSIS — I2510 Atherosclerotic heart disease of native coronary artery without angina pectoris: Secondary | ICD-10-CM | POA: Diagnosis not present

## 2019-05-20 DIAGNOSIS — E119 Type 2 diabetes mellitus without complications: Secondary | ICD-10-CM | POA: Insufficient documentation

## 2019-05-20 LAB — LIPID PANEL
Cholesterol: 195 mg/dL (ref 0–200)
HDL: 53 mg/dL (ref >39.00)
LDL Cholesterol: 108 mg/dL — ABNORMAL HIGH (ref 0–99)
NonHDL: 141.64
Total CHOL/HDL Ratio: 4
Triglycerides: 170 mg/dL — ABNORMAL HIGH (ref 0.0–149.0)
VLDL: 34 mg/dL (ref 0.0–40.0)

## 2019-05-20 LAB — CBC
HCT: 49.1 % (ref 39.0–52.0)
Hemoglobin: 16.8 g/dL (ref 13.0–17.0)
MCHC: 34.2 g/dL (ref 30.0–36.0)
MCV: 94.2 fl (ref 78.0–100.0)
Platelets: 156 10*3/uL (ref 150.0–400.0)
RBC: 5.21 Mil/uL (ref 4.22–5.81)
RDW: 12.7 % (ref 11.5–15.5)
WBC: 5.4 10*3/uL (ref 4.0–10.5)

## 2019-05-20 LAB — COMPREHENSIVE METABOLIC PANEL
ALT: 20 U/L (ref 0–53)
AST: 19 U/L (ref 0–37)
Albumin: 4.4 g/dL (ref 3.5–5.2)
Alkaline Phosphatase: 74 U/L (ref 39–117)
BUN: 18 mg/dL (ref 6–23)
CO2: 28 mEq/L (ref 19–32)
Calcium: 9.5 mg/dL (ref 8.4–10.5)
Chloride: 100 mEq/L (ref 96–112)
Creatinine, Ser: 0.98 mg/dL (ref 0.40–1.50)
GFR: 73.48 mL/min (ref >60.00)
Glucose, Bld: 123 mg/dL — ABNORMAL HIGH (ref 70–99)
Potassium: 4.1 mEq/L (ref 3.5–5.1)
Sodium: 136 mEq/L (ref 135–145)
Total Bilirubin: 1.4 mg/dL — ABNORMAL HIGH (ref 0.2–1.2)
Total Protein: 7.1 g/dL (ref 6.0–8.3)

## 2019-05-20 LAB — HEMOGLOBIN A1C: Hgb A1c MFr Bld: 6.1 % (ref 4.6–6.5)

## 2019-05-20 NOTE — Telephone Encounter (Signed)
Ok thank you 

## 2019-05-20 NOTE — Patient Instructions (Signed)
It was nice to meet you today! I will be in touch with your labs asap  Please continue to exercise Let me know if your heartburn symptoms do not quiet down with medication Please plan to see me in about 6 months and take care

## 2019-05-20 NOTE — Telephone Encounter (Signed)
-----   Message from [name], MD sent at 05/20/2019  2:33 PM EDT ----- Is is possible to add on a fractionated bilirubin for elevated bilirubin?   Thank you! [place]

## 2019-05-20 NOTE — Telephone Encounter (Signed)
I'm not sure if there is enough serum to send out to [facility] for fractionated bilirubin but I am faxing the request to [facility] to check.

## 2019-05-21 ENCOUNTER — Encounter: Payer: Self-pay | Admitting: Family Medicine

## 2019-05-21 DIAGNOSIS — R17 Unspecified jaundice: Secondary | ICD-10-CM | POA: Insufficient documentation

## 2019-05-21 LAB — BILIRUBIN, FRACTIONATED(TOT/DIR/INDIR)
Bilirubin, Direct: 0.2 mg/dL (ref 0.0–0.2)
Indirect Bilirubin: 1.2 mg/dL (calc) (ref 0.2–1.2)
Total Bilirubin: 1.4 mg/dL — ABNORMAL HIGH (ref 0.2–1.2)

## 2019-05-22 ENCOUNTER — Encounter: Payer: Self-pay | Admitting: Family Medicine

## 2019-05-27 ENCOUNTER — Other Ambulatory Visit: Payer: Self-pay | Admitting: Physician Assistant

## 2019-05-27 DIAGNOSIS — K219 Gastro-esophageal reflux disease without esophagitis: Secondary | ICD-10-CM

## 2019-05-30 ENCOUNTER — Ambulatory Visit
Admission: RE | Admit: 2019-05-30 | Discharge: 2019-05-30 | Disposition: A | Discharge status: Home / Self Care | Payer: PPO | Source: Ambulatory Visit | Attending: Physician Assistant | Admitting: Physician Assistant

## 2019-05-30 DIAGNOSIS — K2100 Gastro-esophageal reflux disease with esophagitis, without bleeding: Secondary | ICD-10-CM | POA: Diagnosis not present

## 2019-05-30 DIAGNOSIS — R1012 Left upper quadrant pain: Secondary | ICD-10-CM | POA: Diagnosis not present

## 2019-05-30 DIAGNOSIS — K219 Gastro-esophageal reflux disease without esophagitis: Secondary | ICD-10-CM

## 2019-05-30 DIAGNOSIS — R110 Nausea: Secondary | ICD-10-CM | POA: Diagnosis not present

## 2019-05-30 DIAGNOSIS — K449 Diaphragmatic hernia without obstruction or gangrene: Secondary | ICD-10-CM | POA: Diagnosis not present

## 2019-06-03 ENCOUNTER — Other Ambulatory Visit: Payer: Self-pay | Admitting: Family Medicine

## 2019-06-03 NOTE — Telephone Encounter (Signed)
Copied from [place] [id number]. Topic: Quick Communication - Rx Refill/Question >> Jun 03, 2019  9:51 AM Leward Quan A wrote: Medication: ezetimibe (ZETIA) 10 MG tablet, losartan (COZAAR) 100 MG tablet   Has the patient contacted their pharmacy? Yes (Agent: If no, request that the patient contact the pharmacy for the refill.) (Agent: If yes, when and what did the pharmacy advise?)  Preferred Pharmacy (with phone number or street name): [facility] [place], [place] - [place] [phone] (Phone) [phone] (Fax)    Agent: Please be advised that RX refills may take up to 3 business days. We ask that you follow-up with your pharmacy.

## 2019-06-03 NOTE — Telephone Encounter (Signed)
Requested medication (s) are due for refill today: yes  Requested medication (s) are on the active medication list: yes    Future visit scheduled: yes  Notes to clinic:  Review for refill   Requested Prescriptions  Pending Prescriptions Disp Refills   ezetimibe (ZETIA) 10 MG tablet 30 tablet 35    Sig: Take 1 tablet (10 mg total) by mouth daily.     Cardiovascular:  Antilipid - Sterol Transport Inhibitors Failed - 06/03/2019 10:06 AM      Failed - LDL in normal range and within 360 days    LDL Cholesterol  Date Value Ref Range Status  05/20/2019 108 (H) 0 - 99 mg/dL Final         Failed - Triglycerides in normal range and within 360 days    Triglycerides  Date Value Ref Range Status  05/20/2019 170.0 (H) 0.0 - 149.0 mg/dL Final    Comment:    Normal:  <150 mg/dLBorderline High:  150 - 199 mg/dL         Passed - Total Cholesterol in normal range and within 360 days    Cholesterol  Date Value Ref Range Status  05/20/2019 195 0 - 200 mg/dL Final    Comment:    ATP III Classification       Desirable:  < 200 mg/dL               Borderline High:  200 - 239 mg/dL          High:  > = 240 mg/dL         Passed - HDL in normal range and within 360 days    HDL  Date Value Ref Range Status  05/20/2019 53.00 >39.00 mg/dL Final         Passed - Valid encounter within last 12 months    Recent Outpatient Visits          2 weeks ago Essential hypertension   [facility] [place], MD      Future Appointments            In 5 months Copland, Gay Filler, MD [place] [facility]            losartan (COZAAR) 100 MG tablet       Sig: Take 1 tablet (100 mg total) by mouth daily.     Cardiovascular:  Angiotensin Receptor Blockers Passed - 06/03/2019 10:06 AM      Passed - Cr in normal range and within 180 days    Creat  Date Value Ref Range Status  03/14/2016 0.86 0.70 - 1.18 mg/dL Final    Comment:      For patients > or = 80 years of age: The upper reference limit for Creatinine is approximately 13% higher for people identified as African-American.      Creatinine, Ser  Date Value Ref Range Status  05/20/2019 0.98 0.40 - 1.50 mg/dL Final         Passed - K in normal range and within 180 days    Potassium  Date Value Ref Range Status  05/20/2019 4.1 3.5 - 5.1 mEq/L Final         Passed - Patient is not pregnant      Passed - Last BP in normal range    BP Readings from Last 1 Encounters:  05/20/19 122/80         Passed -  Valid encounter within last 6 months    Recent Outpatient Visits          2 weeks ago Essential hypertension   [facility] [place], MD      Future Appointments            In 5 months Copland, Gay Filler, MD [place] [facility]

## 2019-06-04 ENCOUNTER — Telehealth: Payer: Self-pay | Admitting: Family Medicine

## 2019-06-04 ENCOUNTER — Other Ambulatory Visit: Payer: Self-pay | Admitting: Gastroenterology

## 2019-06-04 DIAGNOSIS — R109 Unspecified abdominal pain: Secondary | ICD-10-CM

## 2019-06-04 MED ORDER — LOSARTAN POTASSIUM 100 MG PO TABS: 100.0000 mg | ORAL_TABLET | Freq: Every day | ORAL | 1 refills | Status: DC

## 2019-06-04 MED ORDER — EZETIMIBE 10 MG PO TABS: 10.0000 mg | ORAL_TABLET | Freq: Every day | ORAL | 5 refills | Status: DC

## 2019-06-04 MED ORDER — EZETIMIBE 10 MG PO TABS: 10.0000 mg | ORAL_TABLET | Freq: Every day | ORAL | 35 refills | Status: DC

## 2019-06-04 NOTE — Telephone Encounter (Signed)
New rx sent with appropriate refills.

## 2019-06-04 NOTE — Telephone Encounter (Signed)
Merry Proud with [facility] calling to clarify on how many refills the ezetimibe (ZETIA) 10 MG tablet prescription needs.  States that it was sent over with 35 refills. Please advise.  CB#:  [phone]

## 2019-06-05 ENCOUNTER — Other Ambulatory Visit: Payer: Self-pay | Admitting: Gastroenterology

## 2019-06-11 ENCOUNTER — Ambulatory Visit
Admission: RE | Admit: 2019-06-11 | Discharge: 2019-06-11 | Disposition: A | Discharge status: Home / Self Care | Payer: PPO | Source: Ambulatory Visit | Attending: Gastroenterology | Admitting: Gastroenterology

## 2019-06-11 DIAGNOSIS — R109 Unspecified abdominal pain: Secondary | ICD-10-CM

## 2019-06-11 DIAGNOSIS — K8020 Calculus of gallbladder without cholecystitis without obstruction: Secondary | ICD-10-CM | POA: Diagnosis not present

## 2019-06-14 NOTE — Progress Notes (Signed)
Curtis Tyler at [facility] [address] [place], [place] [address] [id number] [phone]  Date:  06/17/2019   Name:  Curtis Tyler   DOB:  [birth year]   MRN:  [id number]  PCP:  Curtis Peers, MD    Chief Complaint: No chief complaint on file.   History of Present Illness:  Curtis Tyler is a 78 y.o. very pleasant male patient who presents with the following:  Patient with history of CAD, cerebrovascular disease, hypertension, dyslipidemia  Virtual visit today to discuss an issue with his gallbladder Pt is at office, provider is at home  Pt ID confirmed with 2 factors, he give consent for virtual visit today  shortly after our visit last month he started to have some AM nausea and belly pain He saw his GI doc, Curtis Tyler.  He suggested that he take lomotidine BID This did not really help, so they sent him for a barium swallow-  He has a hiatal hernia, and reflux He was given bentyl- still did not help He then had a RUQ [place] and was found to have gallstones- he has been recommended to see general surgery to get a consultation re: cholecystectomy.  He needs a referral and requests advice He does notice that his sx are improved now- ? Does he need to have surgery still   No vomiting No fever   I reviewed his recent RUQ [place] and labs from last month  Patient Active Problem List   Diagnosis Date Noted  . Elevated bilirubin 05/21/2019  . Pre-diabetes 05/20/2019  . Dyslipidemia 05/03/2019  . Bradycardia 09/12/2018  . Plantar porokeratosis, acquired 05/16/2018  . CAD (coronary artery disease) 02/25/2016  . Abnormal stress ECG with treadmill 02/12/2016  . Colonic polyp 09/21/2015  . Left lower quadrant pain 09/03/2015  . Nasal congestion 09/03/2015  . Cellulitis of right lower extremity 05/14/2015  . Need for immunization against influenza 05/14/2015  . Bilateral low back pain with sciatica 09/03/2014  . Hip pain, bilateral 09/03/2014  .  Bilateral carotid artery stenosis 07/21/2014  . Myalgia 07/21/2014  . Acute pain of left knee 07/15/2014  . Benign prostatic hyperplasia with lower urinary tract symptoms 07/15/2014  . Effusion of left knee 07/15/2014  . History of elevated prostate specific antigen (PSA) 07/15/2014  . Hypertrophy of nasal turbinates 07/15/2014  . 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 Incomplete emptying of bladder 07/15/2014  . Increased urinary frequency 07/15/2014  . 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 Chest pain 09/05/2012  . Cough 08/06/2011  . Coronary artery disease involving native coronary artery without angina pectoris 04/26/2011  . Essential hypertension 04/26/2011  . Hyperlipidemia with target LDL less than 70 04/26/2011  . Cerebrovascular disease 04/26/2011    Past Medical History:  Diagnosis Date  . Arthritis   . BPH (benign prostatic hyperplasia)   . Bunion   . Callus   . Carotid artery occlusion   . Coronary artery disease    left main 60% stenosis. The LAD had a proximal 60-70% stenosis. There was mid 40% stenosis. Diagonal is moderate size with 40% stenosis. The circumflex system included a large OM with 60% ostial stenosis the RCA was occluded before the PDA. The EF was 65%.  2014  . Hyperlipidemia   . Hypertension   . Myocardial infarct ([place]) 1992   inferior posterior  . Obesity   . Radiculopathy   . Shortness of breath dyspnea    with exertion -  Past Surgical History:  Procedure Laterality Date  . APPENDECTOMY  1958  . CARDIAC CATHETERIZATION  05/2004   SHOWING 100% OCCLUDED DISTAL RIGHT CORONARY WITH  LEFT  TO RIGHT COLLATERALS, AS WELL AS ANTEGRADE FLOW, NORMAL LEFT MAIN, 50% NARROWING IN LEFT CIRCUMFLEX  WITH IRREGULARITIES IN THE LAD  . CARDIAC CATHETERIZATION N/A 02/12/2016   Procedure: Left Heart Cath and Coronary Angiography;  Surgeon: Curtis Man, MD;  Location: [facility];  Service: Cardiovascular;  Laterality: N/A;  . CARDIOVASCULAR  STRESS TEST  02/26/2010   EF 65%, SMALL AREA OF MILD INFARCT AND POSSIBLE PER-INFARCT ISCHEMIA IN THE INFEROBASAL WALL  . COLONOSCOPY    . CORONARY ANGIOPLASTY     RIGHT CORONARY WERE UNSUCCESSFUL  . CORONARY ARTERY BYPASS GRAFT N/A 02/25/2016   Procedure: CORONARY ARTERY BYPASS GRAFTING (CABG) times four using the left internal mammary artery and the right saphenus vein ;  Surgeon: Curtis Nakayama, MD;  Location: [facility];  Service: Open Heart Surgery;  Laterality: N/A;  . HERNIA REPAIR  1960   right hernia repair  . INTRAOPERATIVE TRANSESOPHAGEAL ECHOCARDIOGRAM N/A 02/25/2016   Procedure: INTRAOPERATIVE TRANSESOPHAGEAL ECHOCARDIOGRAM;  Surgeon: Curtis Nakayama, MD;  Location: [facility];  Service: Open Heart Surgery;  Laterality: N/A;  . UMBILICAL HERNIA REPAIR    . US ECHOCARDIOGRAPHY  01/03/2007   EF 55-60%    Social History   Tobacco Use  . Smoking status: Former Smoker    Packs/day: 1.00    Years: 25.00    Pack years: 25.00    Types: Cigarettes    Quit date: 07/23/1981    Years since quitting: 37.9  . Smokeless tobacco: Never Used  Substance Use Topics  . Alcohol use: Yes    Alcohol/week: 4.0 standard drinks    Types: 3 Glasses of wine, 1 Cans of beer per week  . Drug use: No    Family History  Problem Relation Age of Onset  . Stroke Father   . Congestive Heart Failure Mother   . Diabetes Mother   . Hypertension Neg Hx     No Known Allergies  Medication list has been reviewed and updated.  Current Outpatient Medications on File Prior to Visit  Medication Sig Dispense Refill  . amLODipine (NORVASC) 5 MG tablet Take 1 tablet (5 mg total) by mouth daily. 90 tablet 2  . aspirin 81 MG tablet Take 81 mg by mouth daily.      . CRESTOR 10 MG tablet Take 10 mg by mouth every other day.     . doxazosin (CARDURA) 4 MG tablet Take 4 mg by mouth at bedtime.      Marland Kitchen ezetimibe (ZETIA) 10 MG tablet Take 1 tablet (10 mg total) by mouth daily. 30 tablet 5  . finasteride (PROSCAR)  5 MG tablet Take 5 mg by mouth daily.      . fish oil-omega-3 fatty acids 1000 MG capsule Take 1 g by mouth daily.      Marland Kitchen gabapentin (NEURONTIN) 300 MG capsule Take 300 mg by mouth at bedtime.    . Glucosamine-Chondroitin-MSM 500-200-150 MG TABS Take by mouth.    . latanoprost (XALATAN) 0.005 % ophthalmic solution Place 1 drop into both eyes daily.    Marland Kitchen losartan (COZAAR) 100 MG tablet Take 1 tablet (100 mg total) by mouth daily. 90 tablet 1  . Multiple Vitamins-Minerals (MULTIVITAMIN WITH MINERALS) tablet Take 1 tablet by mouth daily.    . Plant Sterols and Stanols (CHOLEST OFF) 450  MG TABS Take 900 mg by mouth daily.      No current facility-administered medications on file prior to visit.     Review of Systems:  As per HPI- otherwise negative. No CP or SOB His wife is also present on the call and helps with the history    Physical Examination: There were no vitals filed for this visit. There were no vitals filed for this visit. There is no height or weight on file to calculate BMI. Ideal Body Weight:   Pt observed over video- he looks well No cough, sob or distress is noted  He has noted good BP control at home 128/70 No fever   Assessment and Plan: Gallstones - Plan: Ambulatory referral to General Surgery  Symptomatic gallstones per recent US Advised pt that we can have him consult with surgery, can discuss together if an operation is needed.  He will let me know if any worsening or change in his sx in the meantime   Signed Lamar Blinks, MD

## 2019-06-17 ENCOUNTER — Other Ambulatory Visit: Payer: Self-pay

## 2019-06-17 ENCOUNTER — Ambulatory Visit (INDEPENDENT_AMBULATORY_CARE_PROVIDER_SITE_OTHER): Payer: PPO | Admitting: Family Medicine

## 2019-06-17 ENCOUNTER — Encounter: Payer: Self-pay | Admitting: Family Medicine

## 2019-06-17 DIAGNOSIS — K8020 Calculus of gallbladder without cholecystitis without obstruction: Secondary | ICD-10-CM | POA: Diagnosis not present

## 2019-06-25 DIAGNOSIS — H401131 Primary open-angle glaucoma, bilateral, mild stage: Secondary | ICD-10-CM | POA: Diagnosis not present

## 2019-06-28 DIAGNOSIS — K8020 Calculus of gallbladder without cholecystitis without obstruction: Secondary | ICD-10-CM | POA: Diagnosis not present

## 2019-07-02 ENCOUNTER — Encounter: Payer: Self-pay | Admitting: Family Medicine

## 2019-07-02 ENCOUNTER — Telehealth: Payer: Self-pay

## 2019-07-02 DIAGNOSIS — K8020 Calculus of gallbladder without cholecystitis without obstruction: Secondary | ICD-10-CM

## 2019-07-02 NOTE — Telephone Encounter (Signed)
Left message to return call 

## 2019-07-02 NOTE — Telephone Encounter (Signed)
[place] referral to [facility]

## 2019-07-02 NOTE — Telephone Encounter (Signed)
Spoke with patient. Pt states he wants a referral to a different GI because he felt that Dr. Penelope Coop wasn't responsive to his problems. Dr. Penelope Coop recommended a endoscopy and the pt states he is will to do the endo. Pt states he isn't happy about having the scans, ultrasouds, and barium swallow and still has no answers.

## 2019-07-02 NOTE — Addendum Note (Signed)
Addended by: Lamar Blinks C on: 07/02/2019 07:00 PM   Modules accepted: Orders

## 2019-07-02 NOTE — Telephone Encounter (Signed)
Copied from [place] [phone]. Topic: General - Other >> Jul 01, 2019  3:12 PM Celene Kras A wrote: Reason for CRM: Pt called stating he is needing PCP or nurse to call him to talk about his consultation about his gallbladder. Please advise.

## 2019-07-09 ENCOUNTER — Encounter: Payer: Self-pay | Admitting: Gastroenterology

## 2019-07-11 ENCOUNTER — Encounter: Payer: Self-pay | Admitting: Family Medicine

## 2019-07-16 ENCOUNTER — Other Ambulatory Visit: Payer: Self-pay | Admitting: Cardiology

## 2019-07-18 NOTE — Telephone Encounter (Signed)
Rx(s) sent to pharmacy electronically.  

## 2019-07-29 DIAGNOSIS — N138 Other obstructive and reflux uropathy: Secondary | ICD-10-CM | POA: Diagnosis not present

## 2019-07-29 DIAGNOSIS — N401 Enlarged prostate with lower urinary tract symptoms: Secondary | ICD-10-CM | POA: Diagnosis not present

## 2019-07-30 DIAGNOSIS — N138 Other obstructive and reflux uropathy: Secondary | ICD-10-CM | POA: Diagnosis not present

## 2019-07-30 DIAGNOSIS — R3914 Feeling of incomplete bladder emptying: Secondary | ICD-10-CM | POA: Diagnosis not present

## 2019-07-30 DIAGNOSIS — N401 Enlarged prostate with lower urinary tract symptoms: Secondary | ICD-10-CM | POA: Diagnosis not present

## 2019-08-13 ENCOUNTER — Encounter: Payer: Self-pay | Admitting: Gastroenterology

## 2019-08-13 ENCOUNTER — Telehealth: Payer: Self-pay | Admitting: Gastroenterology

## 2019-08-13 ENCOUNTER — Ambulatory Visit (INDEPENDENT_AMBULATORY_CARE_PROVIDER_SITE_OTHER): Payer: PPO | Admitting: Gastroenterology

## 2019-08-13 VITALS — BP 130/68 | HR 68 | Temp 97.9°F | Ht 62.5 in | Wt 182.4 lb

## 2019-08-13 DIAGNOSIS — R112 Nausea with vomiting, unspecified: Secondary | ICD-10-CM

## 2019-08-13 MED ORDER — PANTOPRAZOLE SODIUM 40 MG PO TBEC: 40.0000 mg | DELAYED_RELEASE_TABLET | Freq: Two times a day (BID) | ORAL | 3 refills | Status: DC

## 2019-08-13 NOTE — Patient Instructions (Signed)
Increase Protonix to 40 mg twice a day.   Continue Famotidine 20 mg twice a day.   It has been recommended to you by your physician that you have a(n) endoscopy completed. Per your request, we did not schedule the procedure(s) today. Please contact our office at [phone] should you decide to have the procedure completed. You will be scheduled for a pre-visit and procedure at that time.

## 2019-08-13 NOTE — Progress Notes (Signed)
Referring Provider: [name], MD Primary Care Physician:  [name], MD  Reason for Consultation:  Nausea   IMPRESSION:  Early morning nausea without alarm features Reflux with tiny hiatal hernia on UGI series History of colon polyps -Initially diagnosed on colonoscopy at age 78 -Most recently had 3 tubular adenomas removed on colonoscopy with Dr. Penelope Coop 2018  Early morning nausea not associated with eating. No associated constipation. No significant stool burden seen on recent UGI series.  No neurologic symptoms.  No alarm features.  Symptoms may be related to poorly controlled reflux or heartburn.  Differential would also include thyroid dysfunction, bacterial overgrowth, dyspepsia, side effects of medications.  Malignancy seems less likely but must be considered.  Symptoms could be worsened by dehydration.  Nausea has been reported with Cialis which was recently added to his regimen, as well as Norvasc, Crestor, Neurontin.   PLAN: Increase Protonix 40 mg BID Continue famotidine 20 mg BID TSH EGD with gastric biopsies for H pylori, esophageal biopsies for reflux and EOE Consider trial of Zofran SL, FDGard versus empiric trial of Xifaxan for possible bacterial overgrowth if symptoms persist Consider conrasted cross-sectional imaging if endoscopy is non-diagnostic Surveillance colonoscopy 2021  The nature of the procedure, as well as the risks, benefits, and alternatives were carefully and thoroughly reviewed with the patient. Ample time for discussion and questions allowed. The patient understood, was satisfied, and agreed to proceed.  Please see the "Patient Instructions" section for addition details about the plan.  HPI: Curtis Tyler is a 78 y.o. male. Retired Lexicographer x 10 years.   History obtained through the patient and review of his electronic health record. CAD, cerebrovascular disease, hypertension, dyslipidemia  Early morning nausea x 5-6  months. Awakes at 7:30. Resolves by 9am.  Nausea does not awake him.  Concurrent left-sided abdominal discomfort when the nausea first started, has recently improved Worsened by drinking too much coffee No associated neurologic complaints or headaches  His only new medication has been Cialis He notes no temporal association with nausea with taking his medications He denies anxiety He denies the use of any street drugs including marijuana  Normal bowel movements. No change in symptoms. Has a BM every morning after drinking after coffee. No straining.  Good appetite. Weight gain of 5 pounds since coronavirus.   Walks 3 miles daily.   No other associated symptoms. No identified exacerbating or relieving features.   Evaluated by Dr. Penelope Coop with a virtual encounter. Subsequent evaluation included:  UGI series 05/30/2019: tiny hiatal hernia, normal esophageal motility, mild reflux Abdominal ultrasound 06/11/19: multiple gallstones measuring up to 1.4 cm, GB sludge, echogenic liver, renal cysts  Pantoprazole 40 mg QD (notes twice daily on his medication list) Did not find famotidine BID or Bentyl helpful.  He has not tried any other OTC, prescription or alternative medical therapy. Consultation with [facility] did not feel this was related to gallbladder disease.   Longstanding history of polyps - first diagnosed at age 3. Prior colonoscopy with Dr. Penelope Coop. Three polyps removed on colonoscopy in 2018. Surveillance recommended in 2021.   No known family history of colon cancer or polyps. No family history of uterine/endometrial cancer, pancreatic cancer or gastric/stomach cancer.   Past Medical History:  Diagnosis Date  . Arthritis   . BPH (benign prostatic hyperplasia)   . Bunion   . Callus   . Carotid artery occlusion   . Coronary artery disease    left main 60% stenosis.  The LAD had a proximal 60-70% stenosis. There was mid 40% stenosis. Diagonal is moderate size with  40% stenosis. The circumflex system included a large OM with 60% ostial stenosis the RCA was occluded before the PDA. The EF was 65%.  2014  . Hyperlipidemia   . Hypertension   . Myocardial infarct ([place]) 1992   inferior posterior  . Obesity   . Radiculopathy   . Shortness of breath dyspnea    with exertion -     Past Surgical History:  Procedure Laterality Date  . APPENDECTOMY  1958  . CARDIAC CATHETERIZATION  05/2004   SHOWING 100% OCCLUDED DISTAL RIGHT CORONARY WITH  LEFT  TO RIGHT COLLATERALS, AS WELL AS ANTEGRADE FLOW, NORMAL LEFT MAIN, 50% NARROWING IN LEFT CIRCUMFLEX  WITH IRREGULARITIES IN THE LAD  . CARDIAC CATHETERIZATION N/A 02/12/2016   Procedure: Left Heart Cath and Coronary Angiography;  Surgeon: Leonie Man, MD;  Location: [facility];  Service: Cardiovascular;  Laterality: N/A;  . CARDIOVASCULAR STRESS TEST  02/26/2010   EF 65%, SMALL AREA OF MILD INFARCT AND POSSIBLE PER-INFARCT ISCHEMIA IN THE INFEROBASAL WALL  . COLONOSCOPY    . CORONARY ANGIOPLASTY     RIGHT CORONARY WERE UNSUCCESSFUL  . CORONARY ARTERY BYPASS GRAFT N/A 02/25/2016   Procedure: CORONARY ARTERY BYPASS GRAFTING (CABG) times four using the left internal mammary artery and the right saphenus vein ;  Surgeon: [name], MD;  Location: [facility];  Service: Open Heart Surgery;  Laterality: N/A;  . HERNIA REPAIR  1960   right hernia repair  . INTRAOPERATIVE TRANSESOPHAGEAL ECHOCARDIOGRAM N/A 02/25/2016   Procedure: INTRAOPERATIVE TRANSESOPHAGEAL ECHOCARDIOGRAM;  Surgeon: [name], MD;  Location: [facility];  Service: Open Heart Surgery;  Laterality: N/A;  . UMBILICAL HERNIA REPAIR    . US ECHOCARDIOGRAPHY  01/03/2007   EF 55-60%    Current Outpatient Medications  Medication Sig Dispense Refill  . pantoprazole (PROTONIX) 40 MG tablet Take 1 tablet by mouth 2 (two) times daily.    . tadalafil (CIALIS) 5 MG tablet Take 1 tablet by mouth daily.    Marland Kitchen amLODipine (NORVASC) 5 MG tablet Take 1  tablet (5 mg total) by mouth daily. 90 tablet 3  . aspirin 81 MG tablet Take 81 mg by mouth daily.      . CRESTOR 10 MG tablet Take 10 mg by mouth every other day.     . doxazosin (CARDURA) 4 MG tablet Take 4 mg by mouth at bedtime.      Marland Kitchen ezetimibe (ZETIA) 10 MG tablet Take 1 tablet (10 mg total) by mouth daily. 30 tablet 5  . finasteride (PROSCAR) 5 MG tablet Take 5 mg by mouth daily.      . fish oil-omega-3 fatty acids 1000 MG capsule Take 1 g by mouth daily.      Marland Kitchen gabapentin (NEURONTIN) 300 MG capsule Take 300 mg by mouth at bedtime.    . Glucosamine-Chondroitin-MSM 500-200-150 MG TABS Take by mouth.    . latanoprost (XALATAN) 0.005 % ophthalmic solution Place 1 drop into both eyes daily.    Marland Kitchen losartan (COZAAR) 100 MG tablet Take 1 tablet (100 mg total) by mouth daily. 90 tablet 1  . Multiple Vitamins-Minerals (MULTIVITAMIN WITH MINERALS) tablet Take 1 tablet by mouth daily.    . Plant Sterols and Stanols (CHOLEST OFF) 450 MG TABS Take 900 mg by mouth daily.      No current facility-administered medications for this visit.  Allergies as of 08/13/2019  . (No Known Allergies)    Family History  Problem Relation Age of Onset  . Stroke Father   . Congestive Heart Failure Mother   . Diabetes Mother   . Hypertension Neg Hx     Social History   Socioeconomic History  . Marital status: Married    Spouse name: Sharyn Lull  . Number of children: 2  . Years of education: HS  . Highest education level: Not on file  Occupational History  . Occupation: retired  Tobacco Use  . Smoking status: Former Smoker    Packs/day: 1.00    Years: 25.00    Pack years: 25.00    Types: Cigarettes    Quit date: 07/23/1981    Years since quitting: 38.0  . Smokeless tobacco: Never Used  Substance and Sexual Activity  . Alcohol use: Yes    Alcohol/week: 4.0 standard drinks    Types: 3 Glasses of wine, 1 Cans of beer per week  . Drug use: No  . Sexual activity: Not on file  Other Topics  Concern  . Not on file  Social History Narrative   Positive HTN and Stroke among his parents who lived up until their late 79's   Patient lives at home with his spouse.   Caffeine Use: 2-4 cups daily   Social Determinants of Health   Financial Resource Strain:   . Difficulty of Paying Living Expenses: Not on file  Food Insecurity:   . Worried About Charity fundraiser in the Last Year: Not on file  . Ran Out of Food in the Last Year: Not on file  Transportation Needs:   . Lack of Transportation (Medical): Not on file  . Lack of Transportation (Non-Medical): Not on file  Physical Activity:   . Days of Exercise per Week: Not on file  . Minutes of Exercise per Session: Not on file  Stress:   . Feeling of Stress : Not on file  Social Connections:   . Frequency of Communication with Friends and Family: Not on file  . Frequency of Social Gatherings with Friends and Family: Not on file  . Attends Religious Services: Not on file  . Active Member of Clubs or Organizations: Not on file  . Attends Archivist Meetings: Not on file  . Marital Status: Not on file  Intimate Partner Violence:   . Fear of Current or Ex-Partner: Not on file  . Emotionally Abused: Not on file  . Physically Abused: Not on file  . Sexually Abused: Not on file    Review of Systems: 12 system ROS is negative except as noted above with the addition of back pain, hearing problems, muscle pains, excessive urination, and urinary leak.   Physical Exam: General:   Alert,  well-nourished, pleasant and cooperative in NAD Head:  Normocephalic and atraumatic. Eyes:  Sclera clear, no icterus.   Conjunctiva pink. Ears:  Normal auditory acuity. Nose:  No deformity, discharge,  or lesions. Mouth:  No deformity or lesions.   Neck:  Supple; no masses or thyromegaly. Lungs:  Clear throughout to auscultation.   No wheezes. Heart:  Regular rate and rhythm; no murmurs. Abdomen:  Soft, ventral hernia, nontender,  nondistended, normal bowel sounds, no rebound or guarding. No hepatosplenomegaly.   Rectal:  Deferred  Msk:  Symmetrical. No boney deformities LAD: No inguinal or umbilical LAD Extremities:  No clubbing or edema. Neurologic:  Alert and  oriented x4;  grossly nonfocal Skin:  Intact without significant lesions or rashes. Psych:  Alert and cooperative. Normal mood and affect.    Damarcus Reggio L. Tarri Glenn, MD, MPH 08/13/2019, 10:17 AM

## 2019-08-13 NOTE — Telephone Encounter (Signed)
Please call the patient and scheduled EGD per office note documentation. Thank you.

## 2019-08-21 ENCOUNTER — Other Ambulatory Visit: Payer: Self-pay

## 2019-08-21 ENCOUNTER — Encounter: Payer: Self-pay | Admitting: Gastroenterology

## 2019-08-21 ENCOUNTER — Ambulatory Visit (AMBULATORY_SURGERY_CENTER): Payer: PPO | Admitting: *Deleted

## 2019-08-21 VITALS — Temp 96.9°F | Ht 62.5 in | Wt 178.8 lb

## 2019-08-21 DIAGNOSIS — Z1159 Encounter for screening for other viral diseases: Secondary | ICD-10-CM

## 2019-08-21 DIAGNOSIS — R112 Nausea with vomiting, unspecified: Secondary | ICD-10-CM

## 2019-08-21 NOTE — Progress Notes (Signed)

## 2019-09-02 ENCOUNTER — Ambulatory Visit (INDEPENDENT_AMBULATORY_CARE_PROVIDER_SITE_OTHER): Payer: PPO

## 2019-09-02 ENCOUNTER — Other Ambulatory Visit: Payer: Self-pay | Admitting: Gastroenterology

## 2019-09-02 DIAGNOSIS — Z1159 Encounter for screening for other viral diseases: Secondary | ICD-10-CM | POA: Diagnosis not present

## 2019-09-03 LAB — SARS CORONAVIRUS 2 (TAT 6-24 HRS): SARS Coronavirus 2: NEGATIVE

## 2019-09-04 ENCOUNTER — Ambulatory Visit (AMBULATORY_SURGERY_CENTER): Payer: PPO | Admitting: Gastroenterology

## 2019-09-04 ENCOUNTER — Encounter: Payer: Self-pay | Admitting: Gastroenterology

## 2019-09-04 ENCOUNTER — Other Ambulatory Visit: Payer: Self-pay

## 2019-09-04 VITALS — BP 130/67 | HR 64 | Temp 97.8°F | Resp 17 | Ht 62.0 in | Wt 178.8 lb

## 2019-09-04 DIAGNOSIS — K3189 Other diseases of stomach and duodenum: Secondary | ICD-10-CM

## 2019-09-04 DIAGNOSIS — R112 Nausea with vomiting, unspecified: Secondary | ICD-10-CM

## 2019-09-04 MED ORDER — SODIUM CHLORIDE 0.9 % IV SOLN: 500.0000 mL | Freq: Once | INTRAVENOUS | Status: DC

## 2019-09-04 NOTE — Progress Notes (Signed)
Called to room to assist during endoscopic procedure.  Patient ID and intended procedure confirmed with present staff. Received instructions for my participation in the procedure from the performing physician.  

## 2019-09-04 NOTE — Op Note (Addendum)
Curtis Tyler Procedure Date: 09/04/2019 10:02 AM MRN: [id number] Endoscopist: Thornton Park MD, MD Age: 79 Referring MD:  Date of Birth: 02-Mar-1941 Gender: Male Account #: [id number] Procedure:                Upper GI endoscopy Indications:              Early morning nausea without alarm features                           Reflux with tiny hiatal hernia on UGI seriesEarly                            morning nausea without alarm features Medicines:                Monitored Anesthesia Care Procedure:                Pre-Anesthesia Assessment:                           - Prior to the procedure, a History and Physical                            was performed, and patient medications and                            allergies were reviewed. The patient's tolerance of                            previous anesthesia was also reviewed. The risks                            and benefits of the procedure and the sedation                            options and risks were discussed with the patient.                            All questions were answered, and informed consent                            was obtained. Prior Anticoagulants: The patient has                            taken no previous anticoagulant or antiplatelet                            agents. ASA Grade Assessment: III - A patient with                            severe systemic disease. After reviewing the risks                            and benefits, the patient was deemed in  satisfactory condition to undergo the procedure.                           After obtaining informed consent, the endoscope was                            passed under direct vision. Throughout the                            procedure, the patient's blood pressure, pulse, and                            oxygen saturations were monitored continuously. The                            Endoscope was  introduced through the mouth, and                            advanced to the third part of duodenum. The upper                            GI endoscopy was accomplished without difficulty.                            The patient tolerated the procedure well. Scope In: Scope Out: Findings:                 The examined esophagus was normal. Biopsies were                            obtained from the proximal and distal esophagus                            with cold forceps for histology of suspected                            eosinophilic esophagitis.                           A small hiatal hernia was present.                           Diffuse minimal inflammation characterized by                            erythema, friability and granularity was found in                            the gastric body. Biopsies were taken from the                            antrum, body, and fundus with a cold forceps for                            histology. Estimated blood loss  was minimal.                           The examined duodenum was normal. Biopsies were                            taken with a cold forceps for histology. Estimated                            blood loss was minimal.                           The cardia and gastric fundus were normal on                            retroflexion. Complications:            No immediate complications. Estimated blood loss:                            Minimal. Estimated Blood Loss:     Estimated blood loss was minimal. Estimated blood                            loss was minimal. Impression:               - Normal esophagus. Biopsied.                           - Mild gastritis. Biopsied.                           - Normal examined duodenum. Biopsied.                           - Small hiatal hernia. Recommendation:           - Patient has a contact number available for                            emergencies. The signs and symptoms of potential                             delayed complications were discussed with the                            patient. Return to normal activities tomorrow.                            Written discharge instructions were provided to the                            patient.                           - Resume previous diet.                           -  Continue present medications.                           - Await pathology results. Thornton Park MD, MD 09/04/2019 10:30:41 AM This report has been signed electronically.

## 2019-09-04 NOTE — Progress Notes (Signed)
Pt tolerated well. VSS. Awake and to recovery. 

## 2019-09-04 NOTE — Progress Notes (Signed)
Temp JR  VS KA   Pt's states no medical or surgical changes since previsit or office visit.

## 2019-09-04 NOTE — Patient Instructions (Signed)
Handouts given:   Gastritis  Hiatal hernia Resume previous diet Continue present medications Await pathology results    YOU HAD AN ENDOSCOPIC PROCEDURE TODAY AT [facility]:   Refer to the procedure report that was given to you for any specific questions about what was found during the examination.  If the procedure report does not answer your questions, please call your gastroenterologist to clarify.  If you requested that your care partner not be given the details of your procedure findings, then the procedure report has been included in a sealed envelope for you to review at your convenience later.  YOU SHOULD EXPECT: Some feelings of bloating in the abdomen. Passage of more gas than usual.  Walking can help get rid of the air that was put into your GI tract during the procedure and reduce the bloating. If you had a lower endoscopy (such as a colonoscopy or flexible sigmoidoscopy) you may notice spotting of blood in your stool or on the toilet paper. If you underwent a bowel prep for your procedure, you may not have a normal bowel movement for a few days.  Please Note:  You might notice some irritation and congestion in your nose or some drainage.  This is from the oxygen used during your procedure.  There is no need for concern and it should clear up in a day or so.  SYMPTOMS TO REPORT IMMEDIATELY:    Following upper endoscopy (EGD)  Vomiting of blood or coffee ground material  New chest pain or pain under the shoulder blades  Painful or persistently difficult swallowing  New shortness of breath  Fever of 100F or higher  Black, tarry-looking stools  For urgent or emergent issues, a gastroenterologist can be reached at any hour by calling [phone].   DIET:  We do recommend a small meal at first, but then you may proceed to your regular diet.  Drink plenty of fluids but you should avoid alcoholic beverages for 24 hours.  ACTIVITY:  You should plan to take it  easy for the rest of today and you should NOT DRIVE or use heavy machinery until tomorrow (because of the sedation medicines used during the test).    FOLLOW UP: Our staff will call the number listed on your records 48-72 hours following your procedure to check on you and address any questions or concerns that you may have regarding the information given to you following your procedure. If we do not reach you, we will leave a message.  We will attempt to reach you two times.  During this call, we will ask if you have developed any symptoms of COVID 19. If you develop any symptoms (ie: fever, flu-like symptoms, shortness of breath, cough etc.) before then, please call [phone].  If you test positive for Covid 19 in the 2 weeks post procedure, please call and report this information to [place].    If any biopsies were taken you will be contacted by phone or by letter within the next 1-3 weeks.  Please call us at [phone] if you have not heard about the biopsies in 3 weeks.    SIGNATURES/CONFIDENTIALITY: You and/or your care partner have signed paperwork which will be entered into your electronic medical record.  These signatures attest to the fact that that the information above on your After Visit Summary has been reviewed and is understood.  Full responsibility of the confidentiality of this discharge information lies with you and/or your care-partner.

## 2019-09-06 ENCOUNTER — Telehealth: Payer: Self-pay | Admitting: *Deleted

## 2019-09-06 ENCOUNTER — Telehealth: Payer: Self-pay

## 2019-09-06 NOTE — Telephone Encounter (Signed)
  Follow up Call-  Call back number 09/04/2019  Post procedure Call Back phone  # [phone]  Permission to leave phone message Yes  Some recent data might be hidden     Patient questions:  Do you have a fever, pain , or abdominal swelling? No. Pain Score  0 *  Have you tolerated food without any problems? Yes.    Have you been able to return to your normal activities? Yes.    Do you have any questions about your discharge instructions: Diet   No. Medications  No. Follow up visit  No.  Do you have questions or concerns about your Care? No.  Actions: * If pain score is 4 or above: No action needed, pain <4.  1. Have you developed a fever since your procedure? no  2.   Have you had an respiratory symptoms (SOB or cough) since your procedure? no  3.   Have you tested positive for COVID 19 since your procedure no  4.   Have you had any family members/close contacts diagnosed with the COVID 19 since your procedure?  no   If yes to any of these questions please route to Joylene John, RN and Alphonsa Gin, Therapist, sports.

## 2019-09-06 NOTE — Telephone Encounter (Signed)
lvm

## 2019-09-09 ENCOUNTER — Other Ambulatory Visit: Payer: Self-pay | Admitting: Family Medicine

## 2019-09-09 MED ORDER — ROSUVASTATIN CALCIUM 10 MG PO TABS: 10.0000 mg | ORAL_TABLET | ORAL | 0 refills | Status: DC

## 2019-09-09 NOTE — Telephone Encounter (Signed)
Refill for Crestor 10 mg. prescribed by historical provider. And need :You will have to enter discrete dispense values to sign the order.

## 2019-09-09 NOTE — Telephone Encounter (Signed)
Medication Refill - Medication: CRESTOR 10 MG tablet  Has the patient contacted their pharmacy? Yes.   (Agent: If no, request that the patient contact the pharmacy for the refill.) (Agent: If yes, when and what did the pharmacy advise?)  Preferred Pharmacy (with phone number or street name): [facility] [place], [place]: Please be advised that RX refills may take up to 3 business days. We ask that you follow-up with your pharmacy.

## 2019-09-10 ENCOUNTER — Ambulatory Visit: Payer: Medicare Other | Attending: Internal Medicine

## 2019-09-10 DIAGNOSIS — Z23 Encounter for immunization: Secondary | ICD-10-CM

## 2019-09-10 NOTE — Progress Notes (Signed)
   Covid-19 Vaccination Clinic  Name:  Curtis Tyler    MRN: [id number] DOB: [birth year]  09/10/2019  Curtis Tyler was observed post Covid-19 immunization for 15 minutes without incidence. He was provided with Vaccine Information Sheet and instruction to access the V-Safe system.   Curtis Tyler was instructed to call 911 with any severe reactions post vaccine: Marland Kitchen Difficulty breathing  . Swelling of your face and throat  . A fast heartbeat  . A bad rash all over your body  . Dizziness and weakness    Immunizations Administered    Name Date Dose VIS Date Route   Pfizer COVID-19 Vaccine 09/10/2019  8:33 AM 0.3 mL 08/09/2019 Intramuscular   Manufacturer: [place]   Lot: [id number]   [place]: [id number]

## 2019-09-12 ENCOUNTER — Telehealth: Payer: Self-pay | Admitting: Gastroenterology

## 2019-09-12 ENCOUNTER — Encounter: Payer: Self-pay | Admitting: Family Medicine

## 2019-09-12 ENCOUNTER — Telehealth: Payer: Self-pay | Admitting: Family Medicine

## 2019-09-12 DIAGNOSIS — R339 Retention of urine, unspecified: Secondary | ICD-10-CM

## 2019-09-12 NOTE — Telephone Encounter (Signed)
Copied from [place] [phone] [id number]. Topic: General - Other >> Sep 12, 2019 11:39 AM Keene Breath wrote: Reason for CRM: Patient called to ask if the doctor would recommend a urologist.  Patient's old doctor will be retiring and patient would like a recommendation.  CB# [phone]

## 2019-09-12 NOTE — Telephone Encounter (Signed)
Please advise on where to send referral to

## 2019-09-12 NOTE — Telephone Encounter (Signed)
Please advise concerning surgical pathology biopsy results.

## 2019-09-12 NOTE — Telephone Encounter (Signed)
Addressed in the pathology thread. Thanks.

## 2019-09-12 NOTE — Telephone Encounter (Signed)
Send patient a MyChart message for clarification

## 2019-09-12 NOTE — Telephone Encounter (Signed)
NotedVaughan Basta RN called and spoke with patient.

## 2019-09-13 NOTE — Addendum Note (Signed)
Addended by: Lamar Blinks C on: 09/13/2019 01:29 PM   Modules accepted: Orders

## 2019-09-14 DIAGNOSIS — R112 Nausea with vomiting, unspecified: Secondary | ICD-10-CM | POA: Diagnosis not present

## 2019-09-19 ENCOUNTER — Telehealth: Payer: Self-pay | Admitting: Family Medicine

## 2019-09-19 ENCOUNTER — Encounter: Payer: Self-pay | Admitting: Family Medicine

## 2019-09-19 MED ORDER — GABAPENTIN 300 MG PO CAPS: 300.0000 mg | ORAL_CAPSULE | Freq: Two times a day (BID) | ORAL | 3 refills | Status: DC

## 2019-09-19 NOTE — Telephone Encounter (Signed)
Medication: gabapentin 300mg  3 times per day  Has the patient contacted their pharmacy? Yes Merry Proud from pharmacy called - not previously ordered by Dr. Lorelei Pont - requesting new RX  Preferred Pharmacy (with phone number or street name):  [facility] at [place], [place] Phone:  [phone]  Fax:  [phone]

## 2019-09-24 ENCOUNTER — Other Ambulatory Visit: Payer: Self-pay | Admitting: *Deleted

## 2019-09-24 ENCOUNTER — Telehealth: Payer: Self-pay | Admitting: Gastroenterology

## 2019-09-24 ENCOUNTER — Other Ambulatory Visit (INDEPENDENT_AMBULATORY_CARE_PROVIDER_SITE_OTHER): Payer: PPO

## 2019-09-24 ENCOUNTER — Encounter: Payer: Self-pay | Admitting: *Deleted

## 2019-09-24 DIAGNOSIS — R112 Nausea with vomiting, unspecified: Secondary | ICD-10-CM | POA: Diagnosis not present

## 2019-09-24 LAB — CBC WITH DIFFERENTIAL/PLATELET
Basophils Absolute: 0 10*3/uL (ref 0.0–0.1)
Basophils Relative: 0.4 % (ref 0.0–3.0)
Eosinophils Absolute: 0.1 10*3/uL (ref 0.0–0.7)
Eosinophils Relative: 0.9 % (ref 0.0–5.0)
HCT: 46.8 % (ref 39.0–52.0)
Hemoglobin: 15.9 g/dL (ref 13.0–17.0)
Lymphocytes Relative: 20.2 % (ref 12.0–46.0)
Lymphs Abs: 1.2 10*3/uL (ref 0.7–4.0)
MCHC: 34 g/dL (ref 30.0–36.0)
MCV: 93.6 fl (ref 78.0–100.0)
Monocytes Absolute: 0.3 10*3/uL (ref 0.1–1.0)
Monocytes Relative: 5.6 % (ref 3.0–12.0)
Neutro Abs: 4.3 10*3/uL (ref 1.4–7.7)
Neutrophils Relative %: 72.9 % (ref 43.0–77.0)
Platelets: 150 10*3/uL (ref 150.0–400.0)
RBC: 5 Mil/uL (ref 4.22–5.81)
RDW: 12.6 % (ref 11.5–15.5)
WBC: 5.8 10*3/uL (ref 4.0–10.5)

## 2019-09-24 LAB — COMPREHENSIVE METABOLIC PANEL
ALT: 22 U/L (ref 0–53)
AST: 19 U/L (ref 0–37)
Albumin: 4.4 g/dL (ref 3.5–5.2)
Alkaline Phosphatase: 65 U/L (ref 39–117)
BUN: 18 mg/dL (ref 6–23)
CO2: 26 mEq/L (ref 19–32)
Calcium: 9.2 mg/dL (ref 8.4–10.5)
Chloride: 102 mEq/L (ref 96–112)
Creatinine, Ser: 0.95 mg/dL (ref 0.40–1.50)
GFR: 76.09 mL/min (ref >60.00)
Glucose, Bld: 122 mg/dL — ABNORMAL HIGH (ref 70–99)
Potassium: 3.8 mEq/L (ref 3.5–5.1)
Sodium: 135 mEq/L (ref 135–145)
Total Bilirubin: 1.1 mg/dL (ref 0.2–1.2)
Total Protein: 7.3 g/dL (ref 6.0–8.3)

## 2019-09-24 NOTE — Telephone Encounter (Signed)
Patient stated he wanted to proceed with the blood work and imaging recommended by Dr. Tarri Glenn if he continued to have issues. Orders placed for the following:  -CBC/CMP -CT abd/pelvis (10/01/19 at 2:30 pm)  Contrast left at the front desk for the patient. Scheduled for f/u with Dr. Tarri Glenn on 10/07/20 at 9:50 am.

## 2019-09-28 ENCOUNTER — Ambulatory Visit: Payer: PPO | Attending: Internal Medicine

## 2019-09-28 ENCOUNTER — Ambulatory Visit: Payer: PPO

## 2019-09-28 DIAGNOSIS — Z23 Encounter for immunization: Secondary | ICD-10-CM | POA: Insufficient documentation

## 2019-09-28 NOTE — Progress Notes (Signed)
   Covid-19 Vaccination Clinic  Name:  Calvon Lovinger    MRN: [id number] DOB: [birth year]  09/28/2019  Mr. Langbehn was observed post Covid-19 immunization for 15 minutes without incidence. He was provided with Vaccine Information Sheet and instruction to access the V-Safe system.   Mr. Klauser was instructed to call 911 with any severe reactions post vaccine: Marland Kitchen Difficulty breathing  . Swelling of your face and throat  . A fast heartbeat  . A bad rash all over your body  . Dizziness and weakness    Immunizations Administered    Name Date Dose VIS Date Route   Pfizer COVID-19 Vaccine 09/28/2019 11:03 AM 0.3 mL 08/09/2019 Intramuscular   Manufacturer: [place]   Lot: [id number]   [place]: [id number]

## 2019-10-01 ENCOUNTER — Encounter (HOSPITAL_COMMUNITY): Payer: Self-pay

## 2019-10-01 ENCOUNTER — Other Ambulatory Visit: Payer: Self-pay

## 2019-10-01 ENCOUNTER — Ambulatory Visit (HOSPITAL_COMMUNITY)
Admission: RE | Admit: 2019-10-01 | Discharge: 2019-10-01 | Disposition: A | Discharge status: Home / Self Care | Payer: PPO | Source: Ambulatory Visit | Attending: Gastroenterology | Admitting: Gastroenterology

## 2019-10-01 DIAGNOSIS — K8020 Calculus of gallbladder without cholecystitis without obstruction: Secondary | ICD-10-CM | POA: Insufficient documentation

## 2019-10-01 DIAGNOSIS — I700 Atherosclerosis of aorta: Secondary | ICD-10-CM | POA: Diagnosis not present

## 2019-10-01 DIAGNOSIS — R1110 Vomiting, unspecified: Secondary | ICD-10-CM | POA: Diagnosis not present

## 2019-10-01 DIAGNOSIS — R112 Nausea with vomiting, unspecified: Secondary | ICD-10-CM | POA: Diagnosis not present

## 2019-10-01 DIAGNOSIS — R911 Solitary pulmonary nodule: Secondary | ICD-10-CM | POA: Insufficient documentation

## 2019-10-01 MED ORDER — IOHEXOL 300 MG/ML  SOLN
100.0000 mL | Freq: Once | INTRAMUSCULAR | Status: AC | PRN
  Administered 2019-10-01: 100 mL via INTRAVENOUS

## 2019-10-01 MED ORDER — SODIUM CHLORIDE (PF) 0.9 % IJ SOLN
INTRAMUSCULAR | Status: AC
  Filled 2019-10-01: qty 50

## 2019-10-03 ENCOUNTER — Other Ambulatory Visit: Payer: Self-pay | Admitting: Family Medicine

## 2019-10-03 ENCOUNTER — Encounter: Payer: Self-pay | Admitting: *Deleted

## 2019-10-03 DIAGNOSIS — R918 Other nonspecific abnormal finding of lung field: Secondary | ICD-10-CM

## 2019-10-07 ENCOUNTER — Telehealth: Payer: Self-pay | Admitting: Family Medicine

## 2019-10-07 NOTE — Progress Notes (Signed)
  Chronic Care Management   Outreach Note  10/07/2019 Name: Curtis Tyler MRN: [id number] DOB: 1941/07/06  Referred by: [name], MD Reason for referral : No chief complaint on file.   An unsuccessful telephone outreach was attempted today. The patient was referred to the pharmacist for assistance with care management and care coordination.   Follow Up Plan:   Raynicia Dukes [employer] Scheduler

## 2019-10-08 ENCOUNTER — Telehealth: Payer: Self-pay | Admitting: Family Medicine

## 2019-10-08 ENCOUNTER — Encounter: Payer: Self-pay | Admitting: Gastroenterology

## 2019-10-08 ENCOUNTER — Ambulatory Visit: Payer: PPO | Admitting: Gastroenterology

## 2019-10-08 VITALS — BP 130/68 | HR 80 | Temp 97.8°F | Ht 62.0 in | Wt 181.0 lb

## 2019-10-08 DIAGNOSIS — R112 Nausea with vomiting, unspecified: Secondary | ICD-10-CM

## 2019-10-08 NOTE — Patient Instructions (Addendum)
I have not changed your medications today.  Two things that might help with your nausea: - Keep saltines at the bedside and see if this minimizes your nausea - Try a wedge pillow. You need to raise more than just your head and your neck. Please make sure the pillow extends to include your back.  If this doesn't help with your nausea we have additional things that we can try: - A Gastric Emptying Scan to see if your nausea is not getting better - Consider treatment with a medicine that relaxes the muscles at the bottom of your stomach to try to minimize your nausea  Let's plan to see each other in a month or so. Please call with any questions or concerns.   Please call [phone] to report what happened during your CT scan. This is a [facility] [employer] phone number and they will be glad to talk with you.

## 2019-10-08 NOTE — Progress Notes (Addendum)
Referring Provider: [name], MD Primary Care Physician:  [name], MD  Chief complaint:  Nausea   IMPRESSION:  Early morning nausea without alarm features Reflux with tiny hiatal hernia on UGI series History of colon polyps -Initially diagnosed on colonoscopy at age 79 -Most recently had 3 tubular adenomas removed on colonoscopy with Dr. Penelope Coop 2018 -Surveillance recommended in 3 years Cholelithiasis without evidence for cholecystitis 10 x 5 mm right lower lobe pulmonary nodule identified on recent CT scan -Reviewed with Dr. Lorelei Pont who will arrange for recommended surveillance  Early morning nausea not associated with eating.  There are no alarm features.  No associated constipation. No significant stool burden seen on recent imaging studies.  No neurologic symptoms.  Recent endoscopy showed no evidence for reflux.  Ultrasound, upper GI series, and contrasted CT scan have not provided a diagnosis. I do not think the pulmonary nodule is the cause of his anemia, although this must be considered. Differential also continues to include thyroid dysfunction, bacterial overgrowth, dyspepsia, gastroparesis, side effects of medications.  Malignancy seems less likely but must be considered.    Gallstones were seen on his recent CT scan.  I do not believe his symptoms are consistent with symptomatic gallbladder disease.  No further evaluation or treatment of gallstones is needed at this time.  I recommended that he continue to avoid medications that can cause nausea.  Nausea has been reported with Cialis which was recently added to his regimen, as well as Norvasc, Crestor, Neurontin.   We discussed additional strategies to minimize morning symptoms including saltines and a trial of a wedge pillow. I recommended a gastric emptying scan to evaluate for gastroparesis but he did not feel like we need to do this yet.  Consider a trial of Buspar for fundic relaxation.   He has a  personal history of colon polyps.  Surveillance colonoscopy is due this year.  Is unclear if he wishes to proceed with any further endoscopy at this time.  PLAN: Continue Protonix 40 mg BID Continue famotidine 20 mg BID Saltines at the bedside Trial wedge pillow TSH Consider gastric emptying scan, he doesn't want to proceed at this time Consider a trial of Buspar if no improvement  Consider trial of Zofran SL, FDGard versus empiric trial of Xifaxan for possible bacterial overgrowth if symptoms persist Surveillance colonoscopy 2021 Follow-up in 1-2 months, earlier as needed   Please see the "Patient Instructions" section for addition details about the plan.  HPI: Vegas Tringali is a 79 y.o. male. Retired Lexicographer x 10 years.  He returns in scheduled follow-up for evaluation of early morning nausea.  The interval h istory obtained through the patient and review of his electronic health record. He has CAD, cerebrovascular disease, hypertension, dyslipidemia.  Previously evaluated by Dr. Penelope Coop with a virtual encounter. Subsequent evaluation included:  UGI series 05/30/2019: tiny hiatal hernia, normal esophageal motility, mild reflux Abdominal ultrasound 06/11/19: multiple gallstones measuring up to 1.4 cm, GB sludge, echogenic liver, renal cysts  Consultation with [facility] did not feel this was related to gallbladder disease.   At the time of consultation his pantoprazole was increased from 40 mg QD to mg BID. He was asked to continue famotidine BID.  He had an EGD 09/04/19 that showed a small hiatal hernia and mild gastropathy. Biopsies were negative for reflux, eosinophilic esophagitis, or H pylori.    A CT abd/pelvis with contrast 10/01/19 showed no acute findings in the abdomen or  pelvis.  A 10 x 5 mm right lower lobe pulmonary nodule was identified.  Radiologist recommended repeat CT scan at 6 to 12 months.  Other findings included cholelithiasis and aortic  atherosclerosis.  Continues to experience early morning nausea over the last 6-7 months. Awakes at 7:30 with the nausea. Resolves by 9am. Worsened by drinking too much coffee. He reports good days and bad days but overall feels like things are better.  He remains concerned about the cause of the symptoms, but, it finding some relief that cancer has not been identified.  He thinks he can live with it. But, he doesn't want to live with it.   No change in bowel habits. No constipation. Has a BM every morning after drinking after coffee. No straining.  No associated neurologic complaints or headaches. Has gained weight since coronavirus.    Past Medical History:  Diagnosis Date  . Arthritis   . BPH (benign prostatic hyperplasia)   . Bunion   . Callus   . Carotid artery occlusion   . Coronary artery disease    left main 60% stenosis. The LAD had a proximal 60-70% stenosis. There was mid 40% stenosis. Diagonal is moderate size with 40% stenosis. The circumflex system included a large OM with 60% ostial stenosis the RCA was occluded before the PDA. The EF was 65%.  2014  . Glaucoma    beginning of glaucoma- uses drops   . Hyperlipidemia   . Hypertension   . Myocardial infarct ([place]) 1992   inferior posterior- discovered in 1992-? when happened   . Obesity   . Radiculopathy   . Shortness of breath dyspnea    with exertion -     Past Surgical History:  Procedure Laterality Date  . APPENDECTOMY  1958  . CARDIAC CATHETERIZATION  05/2004   SHOWING 100% OCCLUDED DISTAL RIGHT CORONARY WITH  LEFT  TO RIGHT COLLATERALS, AS WELL AS ANTEGRADE FLOW, NORMAL LEFT MAIN, 50% NARROWING IN LEFT CIRCUMFLEX  WITH IRREGULARITIES IN THE LAD  . CARDIAC CATHETERIZATION N/A 02/12/2016   Procedure: Left Heart Cath and Coronary Angiography;  Surgeon: Leonie Man, MD;  Location: [facility];  Service: Cardiovascular;  Laterality: N/A;  . CARDIOVASCULAR STRESS TEST  02/26/2010   EF 65%, SMALL AREA OF MILD  INFARCT AND POSSIBLE PER-INFARCT ISCHEMIA IN THE INFEROBASAL WALL  . COLONOSCOPY    . CORONARY ANGIOPLASTY     RIGHT CORONARY WERE UNSUCCESSFUL  . 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 HERNIA REPAIR  1960   right hernia repair  . INTRAOPERATIVE TRANSESOPHAGEAL ECHOCARDIOGRAM N/A 02/25/2016   Procedure: INTRAOPERATIVE TRANSESOPHAGEAL ECHOCARDIOGRAM;  Surgeon: [name], MD;  Location: [facility];  Service: Open Heart Surgery;  Laterality: N/A;  . UMBILICAL HERNIA REPAIR    . US ECHOCARDIOGRAPHY  01/03/2007   EF 55-60%    Current Outpatient Medications  Medication Sig Dispense Refill  . amLODipine (NORVASC) 5 MG tablet Take 1 tablet (5 mg total) by mouth daily. 90 tablet 3  . aspirin 81 MG tablet Take 81 mg by mouth daily.      Marland Kitchen co-enzyme Q-10 30 MG capsule Take 30 mg by mouth 3 (three) times daily.    Marland Kitchen doxazosin (CARDURA) 4 MG tablet Take 4  mg by mouth at bedtime.      Marland Kitchen ezetimibe (ZETIA) 10 MG tablet Take 1 tablet (10 mg total) by mouth daily. 30 tablet 5  . famotidine (PEPCID) 10 MG tablet Take 10 mg by mouth at bedtime.    . finasteride (PROSCAR) 5 MG tablet Take 5 mg by mouth daily.      . fish oil-omega-3 fatty acids 1000 MG capsule Take 1 g by mouth daily.      Marland Kitchen gabapentin (NEURONTIN) 300 MG capsule Take 1 capsule (300 mg total) by mouth 2 (two) times daily. 180 capsule 3  . Glucosamine-Chondroitin-MSM 500-200-150 MG TABS Take by mouth.    . latanoprost (XALATAN) 0.005 % ophthalmic solution Place 1 drop into both eyes daily.    Marland Kitchen losartan (COZAAR) 100 MG tablet Take 1 tablet (100 mg total) by mouth daily. 90 tablet 1  . Multiple Vitamins-Minerals (MULTIVITAMIN WITH MINERALS) tablet Take 1 tablet by mouth daily.    . pantoprazole  (PROTONIX) 40 MG tablet Take 1 tablet (40 mg total) by mouth 2 (two) times daily. 60 tablet 3  . Plant Sterols and Stanols (CHOLEST OFF) 450 MG TABS Take 900 mg by mouth daily.     . rosuvastatin (CRESTOR) 10 MG tablet Take 1 tablet (10 mg total) by mouth every other day. 90 tablet 0  . tadalafil (CIALIS) 5 MG tablet Take 1 tablet by mouth daily.     No current facility-administered medications for this visit.    Allergies as of 10/08/2019  . (No Known Allergies)    Family History  Problem Relation Age of Onset  . Stroke Father   . Congestive Heart Failure Mother   . Diabetes Mother   . Hypertension Neg Hx   . Colon cancer Neg Hx   . Colon polyps Neg Hx   . Esophageal cancer Neg Hx   . Rectal cancer Neg Hx   . Stomach cancer Neg Hx     Social History   Socioeconomic History  . Marital status: Married    Spouse name: Sharyn Lull  . Number of children: 2  . Years of education: HS  . Highest education level: Not on file  Occupational History  . Occupation: retired  Tobacco Use  . Smoking status: Former Smoker    Packs/day: 1.00    Years: 25.00    Pack years: 25.00    Types: Cigarettes    Quit date: 07/23/1981    Years since quitting: 38.2  . Smokeless tobacco: Never Used  Substance and Sexual Activity  . Alcohol use: Yes    Alcohol/week: 8.0 standard drinks    Types: 7 Glasses of wine, 1 Cans of beer per week    Comment: glass of wine a night   . Drug use: No  . Sexual activity: Yes  Other Topics Concern  . Not on file  Social History Narrative   Positive HTN and Stroke among his parents who lived up until their late 57's   Patient lives at home with his spouse.   Caffeine Use: 2-4 cups daily   Social Determinants of Health   Financial Resource Strain:   . Difficulty of Paying Living Expenses: Not on file  Food Insecurity:   . Worried About Charity fundraiser in the Last Year: Not on file  . Ran Out of Food in the Last Year: Not on file  Transportation  Needs:   . Lack of Transportation (Medical): Not on file  . Lack of Transportation (Non-Medical):  Not on file  Physical Activity:   . Days of Exercise per Week: Not on file  . Minutes of Exercise per Session: Not on file  Stress:   . Feeling of Stress : Not on file  Social Connections:   . Frequency of Communication with Friends and Family: Not on file  . Frequency of Social Gatherings with Friends and Family: Not on file  . Attends Religious Services: Not on file  . Active Member of Clubs or Organizations: Not on file  . Attends Archivist Meetings: Not on file  . Marital Status: Not on file  Intimate Partner Violence:   . Fear of Current or Ex-Partner: Not on file  . Emotionally Abused: Not on file  . Physically Abused: Not on file  . Sexually Abused: Not on file    Review of Systems: 12 system ROS is negative except as noted above with the addition of back pain, hearing problems, muscle pains, excessive urination, and urinary leak.   Physical Exam: General:   Alert,  well-nourished, pleasant and cooperative in NAD Head:  Normocephalic and atraumatic. Eyes:  Sclera clear, no icterus.   Conjunctiva pink. Ears:  Normal auditory acuity. Nose:  No deformity, discharge,  or lesions. Mouth:  No deformity or lesions.   Neck:  Supple; no masses or thyromegaly. Lungs:  Clear throughout to auscultation.   No wheezes. Heart:  Regular rate and rhythm; no murmurs. Abdomen:  Soft, ventral hernia, nontender, nondistended, normal bowel sounds, no rebound or guarding. No hepatosplenomegaly.   Rectal:  Deferred  Msk:  Symmetrical. No boney deformities LAD: No inguinal or umbilical LAD Extremities:  No clubbing or edema. Neurologic:  Alert and  oriented x4;  grossly nonfocal Skin:  Intact without significant lesions or rashes. Psych:  Alert and cooperative. Normal mood and affect.    Mea Ozga L. Tarri Glenn, MD, MPH 10/08/2019, 10:12 AM

## 2019-10-08 NOTE — Chronic Care Management (AMB) (Signed)
  Chronic Care Management   Note  10/08/2019 Name: Dontay Harm MRN: [id number] DOB: 1941-03-23  Tevan Marian is a 79 y.o. year old male who is a primary care patient of Copland, Gay Filler, MD. I reached out to Thamas Jaegers by phone today in response to a referral sent by Mr. Umer Patnaude's PCP, Copland, Gay Filler, MD.   Mr. Heffern was given information about Chronic Care Management services today including:  1. CCM service includes personalized support from designated clinical staff supervised by his physician, including individualized plan of care and coordination with other care providers 2. 24/7 contact phone numbers for assistance for urgent and routine care needs. 3. Service will only be billed when office clinical staff spend 20 minutes or more in a month to coordinate care. 4. Only one practitioner may furnish and bill the service in a calendar month. 5. The patient may stop CCM services at any time (effective at the end of the month) by phone call to the office staff. 6. The patient will be responsible for cost sharing (co-pay) of up to 20% of the service fee (after annual deductible is met).  Patient agreed to services and verbal consent obtained.   Follow up plan:   Raynicia Dukes [employer] Scheduler

## 2019-10-10 ENCOUNTER — Other Ambulatory Visit: Payer: Self-pay

## 2019-10-10 ENCOUNTER — Ambulatory Visit: Payer: PPO | Admitting: Pharmacist

## 2019-10-10 DIAGNOSIS — I10 Essential (primary) hypertension: Secondary | ICD-10-CM

## 2019-10-10 DIAGNOSIS — N401 Enlarged prostate with lower urinary tract symptoms: Secondary | ICD-10-CM

## 2019-10-10 DIAGNOSIS — M25562 Pain in left knee: Secondary | ICD-10-CM

## 2019-10-10 DIAGNOSIS — E785 Hyperlipidemia, unspecified: Secondary | ICD-10-CM

## 2019-10-10 DIAGNOSIS — R7303 Prediabetes: Secondary | ICD-10-CM

## 2019-10-10 NOTE — Chronic Care Management (AMB) (Signed)
Chronic Care Management Pharmacy  Name: Curtis Tyler  MRN: [id number] DOB: Jan 01, 1941   Chief Complaint/ HPI  Curtis Tyler,  79 y.o. , male presents for their Initial CCM visit with the clinical pharmacist via telephone  PCP : [name], MD  Their chronic conditions include: HTN, Pre-Diabetes, HLD, CAD, BPH, GERD, Glaucoma, Pain  Office Visits:  06/17/19: Virtual visit w/ Dr. Lorelei Pont - Gallstones. Nausea and belly pain. GI recommended famotidine BID, but did not help. Bentyl did not help. Referred to general surgery  05/20/19: New patient visit w/ Dr. Lorelei Pont - patient wanted all of his care to remain with [facility]. Care established and labs ordered.  Consult Visits: 10/08/19: Gertie Fey visit w/ Dr. Tarri Glenn - Intractable N/V. Colonoscopy 2018. Surveillance recommended in 3 years (2021). Recommendation to avoid medications that can cause nausea. Suspected meds include cialis. Consider trial of buspar for fundic relaxation. Continue protonix 40mg  BID, famotidine 20mg  BID, saltines at bedside, trial wedge pillow, TSH, consider gastric emptying scan (pt declines at current), consider trial of buspar if no improvement, consider trial of zofran SL, FDGard vs empirical trial of Xifaxan for possible bacterial overgrowth if symptoms persist  10/03/19: CT lab results - Small lung nodule, gallstones, aortic atherosclerosis, and renal cysts. GI tract otherwise normal. Radiologist recommends a follow up chest CT in 6-12 months.   08/13/19: Gastro visit w/ Dr. Tarri Glenn - Increase protonix to 40mg  BID  07/30/19: Urology visit w/ Dr. Estill Dooms - BPH. Transition to cialis 5mg  daily and continue finasteride 5mg . After 10-14 days can D/C doxazosin.   06/25/19: Eye Exam w/ Dr. Rex Kras - Primary open angle glaucoma of both eyes, mild stage HVF, OU, 24-2 Normal. Continued latanoprost 0.005% 1 drop in each eye nightly   Medications: Outpatient Encounter Medications as of 10/10/2019  Medication Sig Note  .  amLODipine (NORVASC) 5 MG tablet Take 1 tablet (5 mg total) by mouth daily.   Marland Kitchen aspirin 81 MG tablet Take 81 mg by mouth daily.     . Coenzyme Q10 100 MG TABS Take 100 mg by mouth daily.    Marland Kitchen doxazosin (CARDURA) 4 MG tablet Take 4 mg by mouth at bedtime.     Marland Kitchen ezetimibe (ZETIA) 10 MG tablet Take 1 tablet (10 mg total) by mouth daily.   . famotidine (PEPCID) 20 MG tablet Take 20 mg by mouth 2 (two) times daily.    . finasteride (PROSCAR) 5 MG tablet Take 5 mg by mouth daily.     . fish oil-omega-3 fatty acids 1000 MG capsule Take 1,400 mg by mouth daily.    Marland Kitchen gabapentin (NEURONTIN) 300 MG capsule Take 1 capsule (300 mg total) by mouth 2 (two) times daily. 10/10/2019: Takes 2 capsules HS  . latanoprost (XALATAN) 0.005 % ophthalmic solution Place 1 drop into both eyes daily.   Marland Kitchen losartan (COZAAR) 100 MG tablet Take 1 tablet (100 mg total) by mouth daily.   . pantoprazole (PROTONIX) 40 MG tablet Take 1 tablet (40 mg total) by mouth 2 (two) times daily.   . rosuvastatin (CRESTOR) 10 MG tablet Take 1 tablet (10 mg total) by mouth every other Koehn Salehi.   . tadalafil (CIALIS) 5 MG tablet Take 1 tablet by mouth daily. 10/10/2019: Uses PRN  . Glucosamine-Chondroitin-MSM 500-200-150 MG TABS Take by mouth.   . Multiple Vitamins-Minerals (MULTIVITAMIN WITH MINERALS) tablet Take 1 tablet by mouth daily.   . Plant Sterols and Stanols (CHOLEST OFF) 450 MG TABS Take 900 mg by mouth daily.  No facility-administered encounter medications on file as of 10/10/2019.     Current Diagnosis/Assessment:  Goals Addressed            This Visit's Progress   . A1c goal less than 6.5%       -The prediabetes range for a1c is 5.7% to 6.4%. Your most recent a1c was 6.1%.  -A full diabetes diagnosis usually occurs when your a1c hits 6.5%    . Complete fasting lipid panel at next office visit      . Increase physical activity as tolerated      . LDL goal less than 70       -With a history of heart disease it is advised  to have a LDL level less than 70 to reduce your risk of further heart or brain complications    . Pharmacy Care Plan       Current Barriers:  . Chronic Disease Management support, education, and care coordination needs related to HTN, Pre-Diabetes, HLD, CAD, BPH, GERD, Glaucoma, Pain  Pharmacist Clinical Goal(s):  Marland Kitchen LDL goal <70 . Complete lipid panel at next office visit . A1c <6.5%  Interventions: . Comprehensive medication review performed. . Practice moderation with carbohydrates . Increase physical activity as tolerated  Patient Self Care Activities:  . Patient verbalizes understanding of plan to follow as described above, Self administers medications as prescribed, Calls pharmacy for medication refills, and Calls provider office for new concerns or questions  Initial goal documentation     . Practice moderation with carbohydrates        Social Hx: He was born in [place]. Married for 25 years to [place] post being widowed. 4 kids between him and wife. 4 grandkids.Loves to travel. Hasn't done any since the pandemic. Has stayed very safe.  Walks every [place] for an hour. Was having muscle aching with cholesterol medicine    Pre-Diabetes   Recent Relevant Labs: Lab Results  Component Value Date/Time   HGBA1C 6.1 05/20/2019 10:16 AM   HGBA1C 5.8 08/03/2018 12:00 AM   HGBA1C 6.2 (H) 02/23/2016 02:03 PM     Patient is currently controlled on the following medications: Diet controlled  We discussed: diet and exercise extensively. He loves eating bread and pasta, but has cut back on the pasta although he continues to eat his share of bread.  Plan -Continue control with diet and exercise   Hypertension   CMP     Component Value Date/Time   NA 135 09/24/2019 1402   NA 138 08/03/2018 0000   K 3.8 09/24/2019 1402   CL 102 09/24/2019 1402   CO2 26 09/24/2019 1402   GLUCOSE 122 (H) 09/24/2019 1402   BUN 18 09/24/2019 1402   BUN 21 08/03/2018 0000   CREATININE 0.95  09/24/2019 1402   CREATININE 0.86 03/14/2016 1526   CALCIUM 9.2 09/24/2019 1402   PROT 7.3 09/24/2019 1402   ALBUMIN 4.4 09/24/2019 1402   AST 19 09/24/2019 1402   ALT 22 09/24/2019 1402   ALKPHOS 65 09/24/2019 1402   BILITOT 1.1 09/24/2019 1402   GFRNONAA >60 02/28/2016 0250   GFRAA >60 02/28/2016 0250   BP today is:  <130/80 (124/66 pulse 79 left arm, sitting)  Office blood pressures are  BP Readings from Last 3 Encounters:  10/08/19 130/68  09/04/19 130/67  08/13/19 130/68    Patient has failed these meds in the past: metoprolol (hypotension)  Patient is currently controlled on the following medications: amlodipine 5mg  daily, losartan 100mg  daily,  doxazosin 4mg  HS  Patient checks BP at home when feeling symptomatic  Last checked last week and it was around 110/60.   Was told to do a trial of cialis and doxazosin with plan to D/C doxazosin. When he tried to D/C the doxazosin his BP increased so he had to restart the doxazosin.  When his BP goes up he feels a pressure in his neck and a tingling in his back.   Plan -Continue current medications   CAD    Patient has failed these meds in past: simvastatin, atorvastatin taken around his 60s (lots of myalgias), pravastatin 80mg  (not effective in lowering lipids) Patient is currently controlled on the following medications: aspirin 81mg  daily, rosuvastatin 10mg  every other Elmina Hendel, ezetimibe 10mg  daily, fish oil 1400mg  daily, coQ10 100mg  daily  Had a stress done, was told to go to the cath lab, and then was scheduled for surgery to resolve blockages. Went home in 4 days post surgery.   Patient schedule to see cardio once a year.   We discussed:  Importance of lipid control and medication adherence  Plan -Continue current medications    Hyperlipidemia   Lipid Panel     Component Value Date/Time   CHOL 195 05/20/2019 1016   TRIG 170.0 (H) 05/20/2019 1016   HDL 53.00 05/20/2019 1016   CHOLHDL 4 05/20/2019 1016   VLDL  34.0 05/20/2019 1016   LDLCALC 108 (H) 05/20/2019 1016     ASCVD 10-year risk: Has CAD  LDL goal <70  Patient has failed these meds in past: atorvastatin, simvastatin (myalgia), Pravastatin 80mg  (not effective in lowering lipids) Patient is currently uncontrolled for LDL and TG on the following medications: rosuvastatin 10mg  every other Mellonie Guess, ezetimibe 10mg  daily, fish oil 1400mg  daily, coq10 100mg  daily  We discussed:  diet and exercise extensively. Pt states that he eats a lean diet with chicken and fish. Admits that he likes cheese.  Plan -Get repeat lipid panel (see if this can be order prior to appt with Dr. Lorelei Pont on 11/18/19) -Consider increasing rosuvastatin to 20mg  every other Serjio Deupree noting the desire to reduce pt's muscle pain  BPH    Patient has failed these meds in past: None noted. Patient is currently uncontrolled on the following medications: finasteride 5mg  daily, doxazosin 4mg  daily  Has appt with Urologist in February. He is interested in having a procedure. He is not happy with the constant dribbling  Urination trips at night: Varies greatly; 2x to every 2 hours Troubles starting stream: Yes Trouble emptying bladder: Yes  We discussed:  Expressing his concerns at his urology appt  Plan -Continue current medications  -Follow up and discuss concerns with urologist   GERD    Patient has failed these meds in past: None noted Patient is currently controlled on most days on the following medications: pantoprazole 40mg  BID, famotidine 20mg  BID  We discussed:  Food triggers and sleeping hygiene such as wedge pillow  Plan -Continue current medications  Glaucoma    Patient has failed these meds in past: None noted Patient is currently controlled on the following medications: latanoprost 0.005% 1 drop each eye daily  Plan -Continue current medications  Pain    Patient has failed these meds in past: tramadol (did not want to continue due to concerns of  addiction) Patient is currently controlled on the following medications: gabapentin 300mg  2 tabs HS, [facility]  Gabapentin - wants to reduce the amount used  Plan -Continue current medications

## 2019-10-25 DIAGNOSIS — R3912 Poor urinary stream: Secondary | ICD-10-CM | POA: Diagnosis not present

## 2019-10-25 DIAGNOSIS — N401 Enlarged prostate with lower urinary tract symptoms: Secondary | ICD-10-CM | POA: Diagnosis not present

## 2019-10-26 NOTE — Patient Instructions (Signed)
Visit Information  Goals Addressed            This Visit's Progress   . A1c goal less than 6.5%       -The prediabetes range for a1c is 5.7% to 6.4%. Your most recent a1c was 6.1%.  -A full diabetes diagnosis usually occurs when your a1c hits 6.5%    . Complete fasting lipid panel at next office visit      . Increase physical activity as tolerated      . LDL goal less than 70       -With a history of heart disease it is advised to have a LDL level less than 70 to reduce your risk of further heart or brain complications    . Pharmacy Care Plan       Current Barriers:  . Chronic Disease Management support, education, and care coordination needs related to HTN, Pre-Diabetes, HLD, CAD, BPH, GERD, Glaucoma, Pain  Pharmacist Clinical Goal(s):  Marland Kitchen LDL goal <70 . Complete lipid panel at next office visit . A1c <6.5%  Interventions: . Comprehensive medication review performed. . Practice moderation with carbohydrates . Increase physical activity as tolerated  Patient Self Care Activities:  . Patient verbalizes understanding of plan to follow as described above, Self administers medications as prescribed, Calls pharmacy for medication refills, and Calls provider office for new concerns or questions  Initial goal documentation     . Practice moderation with carbohydrates         Mr. Fowle was given information about Chronic Care Management services today including:  1. CCM service includes personalized support from designated clinical staff supervised by his physician, including individualized plan of care and coordination with other care providers 2. 24/7 contact phone numbers for assistance for urgent and routine care needs. 3. Service will only be billed when office clinical staff spend 20 minutes or more in a month to coordinate care. 4. Only one practitioner may furnish and bill the service in a calendar month. 5. The patient may stop CCM services at any time (effective at the  end of the month) by phone call to the office staff. 6. The patient will be responsible for cost sharing (co-pay) of up to 20% of the service fee (after annual deductible is met).  Patient agreed to services and verbal consent obtained.   The patient verbalized understanding of instructions provided today and agreed to receive a mailed copy of patient instruction and/or educational materials. Telephone follow up appointment with pharmacy team member scheduled for: 04/08/2020  Melvenia Beam Kambre Messner, PharmD Clinical Pharmacist [facility] [phone]   Cholesterol Content in Foods Cholesterol is a waxy, fat-like substance that helps to carry fat in the blood. The body needs cholesterol in small amounts, but too much cholesterol can cause damage to the arteries and heart. Most people should eat less than 200 milligrams (mg) of cholesterol a Pepper Kerrick. Foods with cholesterol  Cholesterol is found in animal-based foods, such as meat, seafood, and dairy. Generally, low-fat dairy and lean meats have less cholesterol than full-fat dairy and fatty meats. The milligrams of cholesterol per serving (mg per serving) of common cholesterol-containing foods are listed below. Meat and other proteins  Egg -- one large whole egg has 186 mg.  Veal shank -- 4 oz has 141 mg.  Lean ground [place] (93% lean) -- 4 oz has 118 mg.  Fat-trimmed lamb loin -- 4 oz has 106 mg.  Lean ground beef (90% lean) --  4 oz has 100 mg.  Lobster -- 3.5 oz has 90 mg.  Pork loin chops -- 4 oz has 86 mg.  Canned salmon -- 3.5 oz has 83 mg.  Fat-trimmed beef top loin -- 4 oz has 78 mg.  Frankfurter -- 1 frank (3.5 oz) has 77 mg.  Crab -- 3.5 oz has 71 mg.  Roasted chicken without skin, white meat -- 4 oz has 66 mg.  Light bologna -- 2 oz has 45 mg.  Deli-cut [place] -- 2 oz has 31 mg.  Canned tuna -- 3.5 oz has 31 mg.  Berniece Salines -- 1 oz has 29 mg.  Oysters and mussels (raw) -- 3.5 oz has 25  mg.  Mackerel -- 1 oz has 22 mg.  Trout -- 1 oz has 20 mg.  Pork sausage -- 1 link (1 oz) has 17 mg.  Salmon -- 1 oz has 16 mg.  Tilapia -- 1 oz has 14 mg. Dairy  Soft-serve ice cream --  cup (4 oz) has 103 mg.  Whole-milk yogurt -- 1 cup (8 oz) has 29 mg.  Cheddar cheese -- 1 oz has 28 mg.  American cheese -- 1 oz has 28 mg.  Whole milk -- 1 cup (8 oz) has 23 mg.  2% milk -- 1 cup (8 oz) has 18 mg.  Cream cheese -- 1 tablespoon (Tbsp) has 15 mg.  Cottage cheese --  cup (4 oz) has 14 mg.  Low-fat (1%) milk -- 1 cup (8 oz) has 10 mg.  Sour cream -- 1 Tbsp has 8.5 mg.  Low-fat yogurt -- 1 cup (8 oz) has 8 mg.  Nonfat Greek yogurt -- 1 cup (8 oz) has 7 mg.  Half-and-half cream -- 1 Tbsp has 5 mg. Fats and oils  Cod liver oil -- 1 tablespoon (Tbsp) has 82 mg.  Butter -- 1 Tbsp has 15 mg.  Lard -- 1 Tbsp has 14 mg.  Bacon grease -- 1 Tbsp has 14 mg.  Mayonnaise -- 1 Tbsp has 5-10 mg.  Margarine -- 1 Tbsp has 3-10 mg. Exact amounts of cholesterol in these foods may vary depending on specific ingredients and brands. Foods without cholesterol Most plant-based foods do not have cholesterol unless you combine them with a food that has cholesterol. Foods without cholesterol include:  Grains and cereals.  Vegetables.  Fruits.  Vegetable oils, such as olive, canola, and sunflower oil.  Legumes, such as peas, beans, and lentils.  Nuts and seeds.  Egg whites. Summary  The body needs cholesterol in small amounts, but too much cholesterol can cause damage to the arteries and heart.  Most people should eat less than 200 milligrams (mg) of cholesterol a Shaunessy Dobratz. This information is not intended to replace advice given to you by your health care provider. Make sure you discuss any questions you have with your health care provider. Document Revised: 07/28/2017 Document Reviewed: 04/11/2017 Elsevier Patient Education  [place].

## 2019-11-04 NOTE — Progress Notes (Signed)
TELEHEALTH VISIT  Referring Provider: [name], MD Primary Care Physician:  [name], MD  Tele-visit due to COVID-19 pandemic Patient requested visit virtually, consented to the virtual encounter via video enabled telemedicine application (Doximity) Contact made at:  11/05/19 14:00 Patient verified by name and date of birth Location of patient: Home Location provider: [facility] Names of persons participating: Me, patient, his wife, Curtis Tyler CMA Time spent on telehealth visit: 22 minutes on Doximity I discussed the limitations of evaluation and management by telemedicine. The patient expressed understanding and agreed to proceed.  Chief complaint:  Left-sided abdominal pain   IMPRESSION:  Left-sided abdominal pain Early morning nausea without alarm features Reflux with tiny hiatal hernia on UGI series History of colon polyps -Initially diagnosed on colonoscopy at age 50 -Most recently had 3 tubular adenomas removed on colonoscopy with Dr. Penelope Coop 2018 -Surveillance recommended in 3 years Cholelithiasis without evidence for cholecystitis 10 x 5 mm right lower lobe pulmonary nodule identified on recent CT scan -Reviewed with Dr. Lorelei Pont who will arrange for recommended surveillance  LUQ pain: No alarm features. Notes relationship with anixety/stress. Etiology unclear after reviewing recent ultrasound, UGI series, EGD, and contrasted CT scan. No change despite BID PPI and H2Blocker. Will attempt to taper the PPI as I'm not convinced it is improving his symptoms. Will discuss strategies with Dr. Lorelei Pont - is he a candidate for Bentyl or Levsin given his advanced age? Is other treatment for anxiety/stress indicated?  Early morning nausea not associated with eating.  There are no alarm features.  Recent endoscopy showed no evidence for reflux.  Ultrasound, upper GI series, and contrasted CT scan have not provided a diagnosis. I do not think the pulmonary  nodule is the cause of his anemia, although this must be considered. I do not believe his symptoms are consistent with symptomatic gallbladder disease/cholelithiasis.  Consider gastric emptying scan to evaluate for gastroparesis if/when the patient would like to proceed.  Consider a trial of Buspar for fundic relaxation.   He has a personal history of colon polyps.  Surveillance colonoscopy is due this year if he would like to proceed.  PLAN: - Taper  Protonix 40 mg to QD this week, then QOD next week, then stop - Continue famotidine 20 mg BID - Discuss trial of Levsin or Bentyl with Dr. Lorelei Pont - Consider gastric emptying scan, he doesn't want to proceed at this time - Consider a trial of Buspar if nausea because the predominant symptom - Consider trial of Zofran SL, FDGard versus empiric trial of Xifaxan for possible bacterial overgrowth if nausea returns as the predominant symptom - Surveillance colonoscopy 2021 - Follow-up in 1-2 months, earlier as needed  I spent 30 minutes of time, including independent review of results as outlined above, communicating results with the patient directly, face-to-face time with the patient, coordinating care, ordering studies and medications as appropriate, and documentation.    HPI: Curtis Tyler is a 79 y.o. male. Retired Lexicographer x 10 years. Scheduled follow-up virtual visit today for evaluation of early morning nausea. Today his primary concern is left-sided abdominal pain.  The interval history obtained through the patient and review of his electronic health record. He has CAD, cerebrovascular disease, hypertension, dyslipidemia.  Previously evaluated by Dr. Penelope Coop with a virtual encounter. Subsequent evaluation included:  UGI series 05/30/2019: tiny hiatal hernia, normal esophageal motility, mild reflux Abdominal ultrasound 06/11/19: multiple gallstones measuring up to 1.4 cm, GB sludge, echogenic liver, renal cysts  Consultation with  [facility] did not feel this was related to gallbladder disease.   At the time of his initial consultation with me his pantoprazole was increased from 40 mg QD to mg BID. He was asked to continue famotidine BID.  He had an EGD 09/04/19 that showed a small hiatal hernia and mild gastropathy. Biopsies were negative for reflux, eosinophilic esophagitis, or H pylori.    A CT abd/pelvis with contrast 10/01/19 showed no acute findings in the abdomen or pelvis.  A 10 x 5 mm right lower lobe pulmonary nodule was identified.  Radiologist recommended repeat CT scan at 6 to 12 months.  Other findings included cholelithiasis and aortic atherosclerosis.  Continues to experience early morning nausea over the last 6-7 months. Awakes at 7:30 with the nausea. Resolves by 9am. Worsened by drinking too much coffee. He reports good days and bad days but overall feels like things are better.  He remains concerned about the cause of the symptoms, but, it finding some relief that cancer has not been identified.  He thinks he can live with it. But, he doesn't want to live with it.  He tried a wedge pillow but it wasn't helpful. He feels that he needs to sleep on his side.   No change in bowel habits. No constipation. Has a BM every morning after drinking after coffee. No straining.  No associated neurologic complaints or headaches. Has gained weight since coronavirus.   Today he is most bothered by a pain in his LUQ pain side described as a burning feeling located just below the rib. Present for years, but worse over the last several months to year. Last minutes to hours if not all day.  Mylanta or a glass or milk helps. No noted change with defecation, eating, or movement. Pain intensifies with stress.  He was concerned that he might have an ulcer. Unable to reproduce the pain on exam. Doesn't think the nausea is related to the pain. And, the nausea is not as worrisome to him as the pain.   Past Medical History:    Diagnosis Date  . Arthritis   . BPH (benign prostatic hyperplasia)   . Bunion   . Callus   . Carotid artery occlusion   . Coronary artery disease    left main 60% stenosis. The LAD had a proximal 60-70% stenosis. There was mid 40% stenosis. Diagonal is moderate size with 40% stenosis. The circumflex system included a large OM with 60% ostial stenosis the RCA was occluded before the PDA. The EF was 65%.  2014  . Glaucoma    beginning of glaucoma- uses drops   . Hyperlipidemia   . Hypertension   . Myocardial infarct ([place]) 1992   inferior posterior- discovered in 1992-? when happened   . Obesity   . Radiculopathy   . Shortness of breath dyspnea    with exertion -     Past Surgical History:  Procedure Laterality Date  . APPENDECTOMY  1958  . CARDIAC CATHETERIZATION  05/2004   SHOWING 100% OCCLUDED DISTAL RIGHT CORONARY WITH  LEFT  TO RIGHT COLLATERALS, AS WELL AS ANTEGRADE FLOW, NORMAL LEFT MAIN, 50% NARROWING IN LEFT CIRCUMFLEX  WITH IRREGULARITIES IN THE LAD  . CARDIAC CATHETERIZATION N/A 02/12/2016   Procedure: Left Heart Cath and Coronary Angiography;  Surgeon: Leonie Man, MD;  Location: [facility];  Service: Cardiovascular;  Laterality: N/A;  . CARDIOVASCULAR STRESS TEST  02/26/2010   EF 65%, SMALL AREA  OF MILD INFARCT AND POSSIBLE PER-INFARCT ISCHEMIA IN THE INFEROBASAL WALL  . COLONOSCOPY    . CORONARY ANGIOPLASTY     RIGHT CORONARY WERE UNSUCCESSFUL  . 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 HERNIA REPAIR  1960   right hernia repair  . INTRAOPERATIVE TRANSESOPHAGEAL ECHOCARDIOGRAM N/A 02/25/2016   Procedure: INTRAOPERATIVE TRANSESOPHAGEAL ECHOCARDIOGRAM;  Surgeon: [name], MD;  Location: [facility];  Service: Open Heart  Surgery;  Laterality: N/A;  . UMBILICAL HERNIA REPAIR    . US ECHOCARDIOGRAPHY  01/03/2007   EF 55-60%    Current Outpatient Medications  Medication Sig Dispense Refill  . amLODipine (NORVASC) 5 MG tablet Take 1 tablet (5 mg total) by mouth daily. 90 tablet 3  . aspirin 81 MG tablet Take 81 mg by mouth daily.      . Coenzyme Q10 100 MG TABS Take 100 mg by mouth daily.     Marland Kitchen doxazosin (CARDURA) 4 MG tablet Take 4 mg by mouth at bedtime.      Marland Kitchen ezetimibe (ZETIA) 10 MG tablet Take 1 tablet (10 mg total) by mouth daily. 30 tablet 5  . famotidine (PEPCID) 20 MG tablet Take 20 mg by mouth 2 (two) times daily.     . finasteride (PROSCAR) 5 MG tablet Take 5 mg by mouth daily.      . fish oil-omega-3 fatty acids 1000 MG capsule Take 1,400 mg by mouth daily.     Marland Kitchen gabapentin (NEURONTIN) 300 MG capsule Take 1 capsule (300 mg total) by mouth 2 (two) times daily. 180 capsule 3  . Glucosamine-Chondroitin-MSM 500-200-150 MG TABS Take by mouth.    . latanoprost (XALATAN) 0.005 % ophthalmic solution Place 1 drop into both eyes daily.    Marland Kitchen losartan (COZAAR) 100 MG tablet Take 1 tablet (100 mg total) by mouth daily. 90 tablet 1  . Multiple Vitamins-Minerals (MULTIVITAMIN WITH MINERALS) tablet Take 1 tablet by mouth daily.    . pantoprazole (PROTONIX) 40 MG tablet Take 1 tablet (40 mg total) by mouth 2 (two) times daily. 60 tablet 3  . Plant Sterols and Stanols (CHOLEST OFF) 450 MG TABS Take 900 mg by mouth daily.     . rosuvastatin (CRESTOR) 10 MG tablet Take 1 tablet (10 mg total) by mouth every other day. 90 tablet 0  . tadalafil (CIALIS) 5 MG tablet Take 1 tablet by mouth daily.     No current facility-administered medications for this visit.    Allergies as of 11/05/2019  . (No Known Allergies)    Family History  Problem Relation Age of Onset  . Stroke Father   . Congestive Heart Failure Mother   . Diabetes Mother   . Hypertension Neg Hx   . Colon cancer Neg Hx   . Colon polyps Neg Hx   .  Esophageal cancer Neg Hx   . Rectal cancer Neg Hx   . Stomach cancer Neg Hx     Social History   Socioeconomic History  . Marital status: Married    Spouse name: Curtis Tyler  . Number of children: 2  . Years of education: HS  . Highest education level: Not on file  Occupational History  . Occupation: retired  Tobacco Use  . Smoking status: Former Smoker  Packs/day: 1.00    Years: 25.00    Pack years: 25.00    Types: Cigarettes    Quit date: 07/23/1981    Years since quitting: 38.3  . Smokeless tobacco: Never Used  Substance and Sexual Activity  . Alcohol use: Yes    Alcohol/week: 8.0 standard drinks    Types: 7 Glasses of wine, 1 Cans of beer per week    Comment: glass of wine a night   . Drug use: No  . Sexual activity: Yes  Other Topics Concern  . Not on file  Social History Narrative   Positive HTN and Stroke among his parents who lived up until their late 74's   Patient lives at home with his spouse.   Caffeine Use: 2-4 cups daily   Social Determinants of Health   Financial Resource Strain:   . Difficulty of Paying Living Expenses: Not on file  Food Insecurity:   . Worried About Charity fundraiser in the Last Year: Not on file  . Ran Out of Food in the Last Year: Not on file  Transportation Needs:   . Lack of Transportation (Medical): Not on file  . Lack of Transportation (Non-Medical): Not on file  Physical Activity:   . Days of Exercise per Week: Not on file  . Minutes of Exercise per Session: Not on file  Stress:   . Feeling of Stress : Not on file  Social Connections:   . Frequency of Communication with Friends and Family: Not on file  . Frequency of Social Gatherings with Friends and Family: Not on file  . Attends Religious Services: Not on file  . Active Member of Clubs or Organizations: Not on file  . Attends Archivist Meetings: Not on file  . Marital Status: Not on file  Intimate Partner Violence:   . Fear of Current or Ex-Partner:  Not on file  . Emotionally Abused: Not on file  . Physically Abused: Not on file  . Sexually Abused: Not on file     Physical Exam: Complete physical exam not performed due to the limits inherent in a telehealth encounter.  Gen: Awake, alert, and oriented, and well communicative. HEENT: EOMI, non-icteric sclera, NCAT, MMM  Neck: Normal movement of head and neck  Pulm: No labored breathing, speaking in full sentences without conversational dyspnea  Abd: Patient localizes the pain to the mid upper left abdomen, approximately 2 fingerbreaths below the costal margin in the midclavicular. He is unable to reproduce the pain on palpitation.  Derm: No apparent lesions or bruising in visible field  MS: Moves all visible extremities without noticeable abnormality  Psych: Pleasant, cooperative, normal speech, thought processing seemingly intact      Felton Buczynski L. Tarri Glenn, MD, MPH 11/04/2019, 4:29 PM

## 2019-11-05 ENCOUNTER — Encounter: Payer: Self-pay | Admitting: Gastroenterology

## 2019-11-05 ENCOUNTER — Ambulatory Visit (INDEPENDENT_AMBULATORY_CARE_PROVIDER_SITE_OTHER): Payer: PPO | Admitting: Gastroenterology

## 2019-11-05 DIAGNOSIS — R112 Nausea with vomiting, unspecified: Secondary | ICD-10-CM

## 2019-11-05 DIAGNOSIS — R1012 Left upper quadrant pain: Secondary | ICD-10-CM

## 2019-11-05 NOTE — Patient Instructions (Addendum)
Taper Protonix 40 mg to daily this week, then every other day next week then stop.   Continue Famotidine 20 mg twice a day

## 2019-11-06 ENCOUNTER — Telehealth: Payer: Self-pay

## 2019-11-06 ENCOUNTER — Encounter: Payer: Self-pay | Admitting: Family Medicine

## 2019-11-06 DIAGNOSIS — I10 Essential (primary) hypertension: Secondary | ICD-10-CM

## 2019-11-06 DIAGNOSIS — E785 Hyperlipidemia, unspecified: Secondary | ICD-10-CM

## 2019-11-06 DIAGNOSIS — R7303 Prediabetes: Secondary | ICD-10-CM

## 2019-11-06 MED ORDER — DICYCLOMINE HCL 10 MG PO CAPS: ORAL_CAPSULE | ORAL | 3 refills | Status: DC

## 2019-11-06 NOTE — Telephone Encounter (Signed)
-----   Message from [place], MD sent at 11/06/2019  1:18 PM EST ----- Please let the patient know that I reviewed his care with Dr. Lorelei Pont. I would like for him to try Bentyl 10 mg QAM. He may use it an additional 3 times daily as needed for abdominal pain. He may have #50 with 3 refills. Thank you.

## 2019-11-06 NOTE — Telephone Encounter (Signed)
Spoke with pt and he is aware. Script sent to pharmacy. 

## 2019-11-11 NOTE — Telephone Encounter (Signed)
I have him down for labs on the 18ths. Could you please place orders?

## 2019-11-14 ENCOUNTER — Other Ambulatory Visit (INDEPENDENT_AMBULATORY_CARE_PROVIDER_SITE_OTHER): Payer: PPO

## 2019-11-14 ENCOUNTER — Other Ambulatory Visit: Payer: Self-pay

## 2019-11-14 ENCOUNTER — Encounter: Payer: Self-pay | Admitting: Family Medicine

## 2019-11-14 DIAGNOSIS — E785 Hyperlipidemia, unspecified: Secondary | ICD-10-CM | POA: Diagnosis not present

## 2019-11-14 DIAGNOSIS — R7303 Prediabetes: Secondary | ICD-10-CM

## 2019-11-14 LAB — LIPID PANEL
Cholesterol: 169 mg/dL (ref 0–200)
HDL: 59.2 mg/dL (ref >39.00)
LDL Cholesterol: 92 mg/dL (ref 0–99)
NonHDL: 109.8
Total CHOL/HDL Ratio: 3
Triglycerides: 91 mg/dL (ref 0.0–149.0)
VLDL: 18.2 mg/dL (ref 0.0–40.0)

## 2019-11-14 LAB — HEMOGLOBIN A1C: Hgb A1c MFr Bld: 6 % (ref 4.6–6.5)

## 2019-11-14 NOTE — Progress Notes (Signed)
Nurse connected with patient 11/15/19 at  9:30 AM EDT by a telephone enabled telemedicine application and verified that I am speaking with the correct person using two identifiers. Patient stated full name and DOB. Patient gave permission to continue with virtual visit. Patient's location was at home and Nurse's location was at [employer] office.   Subjective:   Curtis Tyler is a 79 y.o. male who presents for an Initial Medicare Annual Wellness Visit.  Review of Systems  Home Safety/Smoke Alarms: Feels safe in home. Smoke alarms in place.  Lives w/ wife. 1 story home.   Male:   CCS- 05/04/17.   No longer doing routine screening due to age. PSA- No results found for: PSA     Objective:     Advanced Directives 11/15/2019 03/18/2019 04/07/2016 02/25/2016 02/23/2016 02/12/2016 12/09/2015  Does Patient Have a Medical Advance Directive? Yes No Yes Yes Yes Yes No  Type of Paramedic of [place];Living will - - Living will;Healthcare Power of [place];Living will Living will;Healthcare Power of Attorney -  Does patient want to make changes to medical advance directive? No - Patient declined - - No - Patient declined - No - Patient declined -  Copy of [place] in Chart? Yes - validated most recent copy scanned in chart (See row information) - - Yes No - copy requested No - copy requested -  Would patient like information on creating a medical advance directive? - No - Patient declined - - - - No - patient declined information    Current Medications (verified) Outpatient Encounter Medications as of 11/15/2019  Medication Sig  . amLODipine (NORVASC) 5 MG tablet Take 1 tablet (5 mg total) by mouth daily.  Marland Kitchen aspirin 81 MG tablet Take 81 mg by mouth daily.    . Coenzyme Q10 100 MG TABS Take 100 mg by mouth daily.   Marland Kitchen dicyclomine (BENTYL) 10 MG capsule Take 1 pill by mouth every am. May take additional 3 times daily prn  . doxazosin  (CARDURA) 4 MG tablet Take 4 mg by mouth at bedtime.    Marland Kitchen ezetimibe (ZETIA) 10 MG tablet Take 1 tablet (10 mg total) by mouth daily.  . finasteride (PROSCAR) 5 MG tablet Take 5 mg by mouth daily.    . fish oil-omega-3 fatty acids 1000 MG capsule Take 1,400 mg by mouth daily.   Marland Kitchen gabapentin (NEURONTIN) 300 MG capsule Take 1 capsule (300 mg total) by mouth 2 (two) times daily.  . Glucosamine-Chondroitin-MSM 500-200-150 MG TABS Take by mouth.  . losartan (COZAAR) 100 MG tablet Take 1 tablet (100 mg total) by mouth daily.  . Multiple Vitamins-Minerals (MULTIVITAMIN WITH MINERALS) tablet Take 1 tablet by mouth daily.  . Plant Sterols and Stanols (CHOLEST OFF) 450 MG TABS Take 900 mg by mouth daily.   . rosuvastatin (CRESTOR) 10 MG tablet Take 1 tablet (10 mg total) by mouth every other day.  . tadalafil (CIALIS) 5 MG tablet Take 1 tablet by mouth daily.  . tamsulosin (FLOMAX) 0.4 MG CAPS capsule Take 0.4 mg by mouth daily.  . famotidine (PEPCID) 20 MG tablet Take 20 mg by mouth 2 (two) times daily.   Marland Kitchen latanoprost (XALATAN) 0.005 % ophthalmic solution Place 1 drop into both eyes daily.  . pantoprazole (PROTONIX) 40 MG tablet Take 1 tablet (40 mg total) by mouth 2 (two) times daily. (Patient not taking: Reported on 11/15/2019)   No facility-administered encounter medications on file as of  11/15/2019.    Allergies (verified) Patient has no known allergies.   History: Past Medical History:  Diagnosis Date  . Arthritis   . BPH (benign prostatic hyperplasia)   . Bunion   . Callus   . Carotid artery occlusion   . Coronary artery disease    left main 60% stenosis. The LAD had a proximal 60-70% stenosis. There was mid 40% stenosis. Diagonal is moderate size with 40% stenosis. The circumflex system included a large OM with 60% ostial stenosis the RCA was occluded before the PDA. The EF was 65%.  2014  . Glaucoma    beginning of glaucoma- uses drops   . Hyperlipidemia   . Hypertension   .  Myocardial infarct ([place]) 1992   inferior posterior- discovered in 1992-? when happened   . Obesity   . Radiculopathy   . Shortness of breath dyspnea    with exertion -    Past Surgical History:  Procedure Laterality Date  . APPENDECTOMY  1958  . CARDIAC CATHETERIZATION  05/2004   SHOWING 100% OCCLUDED DISTAL RIGHT CORONARY WITH  LEFT  TO RIGHT COLLATERALS, AS WELL AS ANTEGRADE FLOW, NORMAL LEFT MAIN, 50% NARROWING IN LEFT CIRCUMFLEX  WITH IRREGULARITIES IN THE LAD  . CARDIAC CATHETERIZATION N/A 02/12/2016   Procedure: Left Heart Cath and Coronary Angiography;  Surgeon: Leonie Man, MD;  Location: [facility];  Service: Cardiovascular;  Laterality: N/A;  . CARDIOVASCULAR STRESS TEST  02/26/2010   EF 65%, SMALL AREA OF MILD INFARCT AND POSSIBLE PER-INFARCT ISCHEMIA IN THE INFEROBASAL WALL  . COLONOSCOPY    . CORONARY ANGIOPLASTY     RIGHT CORONARY WERE UNSUCCESSFUL  . 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 HERNIA REPAIR  1960   right hernia repair  . INTRAOPERATIVE TRANSESOPHAGEAL ECHOCARDIOGRAM N/A 02/25/2016   Procedure: INTRAOPERATIVE TRANSESOPHAGEAL ECHOCARDIOGRAM;  Surgeon: [name], MD;  Location: [facility];  Service: Open Heart Surgery;  Laterality: N/A;  . UMBILICAL HERNIA REPAIR    . US ECHOCARDIOGRAPHY  01/03/2007   EF 55-60%   Family History  Problem Relation Age of Onset  . Stroke Father   . Congestive Heart Failure Mother   . Diabetes Mother   . Hypertension Neg Hx   . Colon cancer Neg Hx   . Colon polyps Neg Hx   . Esophageal cancer Neg Hx   . Rectal cancer Neg Hx   . Stomach cancer Neg Hx    Social History   Socioeconomic History  . Marital status: Married    Spouse name: Sharyn Lull  . Number of children: 2  .  Years of education: HS  . Highest education level: Not on file  Occupational History  . Occupation: retired  Tobacco Use  . Smoking status: Former Smoker    Packs/day: 1.00    Years: 25.00    Pack years: 25.00    Types: Cigarettes    Quit date: 07/23/1981    Years since quitting: 38.3  . Smokeless tobacco: Never Used  Substance and Sexual Activity  . Alcohol use: Yes    Alcohol/week: 8.0 standard drinks    Types: 7 Glasses of wine, 1 Cans of beer per week    Comment: glass of wine a night   .  Drug use: No  . Sexual activity: Yes  Other Topics Concern  . Not on file  Social History Narrative   Positive HTN and Stroke among his parents who lived up until their late 52's   Patient lives at home with his spouse.   Caffeine Use: 2-4 cups daily   Social Determinants of Health   Financial Resource Strain: Low Risk   . Difficulty of Paying Living Expenses: Not hard at all  Food Insecurity: No Food Insecurity  . Worried About Charity fundraiser in the Last Year: Never true  . Ran Out of Food in the Last Year: Never true  Transportation Needs: No Transportation Needs  . Lack of Transportation (Medical): No  . Lack of Transportation (Non-Medical): No  Physical Activity:   . Days of Exercise per Week:   . Minutes of Exercise per Session:   Stress:   . Feeling of Stress :   Social Connections:   . Frequency of Communication with Friends and Family:   . Frequency of Social Gatherings with Friends and Family:   . Attends Religious Services:   . Active Member of Clubs or Organizations:   . Attends Archivist Meetings:   Marland Kitchen Marital Status:    Tobacco Counseling Counseling given: Not Answered   Clinical Intake:  Pain : No/denies pain  Activities of Daily Living In your present state of health, do you have any difficulty performing the following activities: 11/15/2019  Hearing? N  Vision? N  Difficulty concentrating or making decisions? N  Walking or climbing  stairs? N  Dressing or bathing? N  Doing errands, shopping? N  Preparing Food and eating ? N  Using the Toilet? N  In the past six months, have you accidently leaked urine? N  Do you have problems with loss of bowel control? N  Managing your Medications? N  Managing your Finances? N  Housekeeping or managing your Housekeeping? N  Some recent data might be hidden     Immunizations and Health Maintenance Immunization History  Administered Date(s) Administered  . Influenza Split 05/30/2011  . PFIZER SARS-COV-2 Vaccination 09/10/2019, 09/28/2019  . Td 02/06/2007  . Zoster Recombinat (Shingrix) 12/18/2017, 02/26/2018   Health Maintenance Due  Topic Date Due  . PNA vac Low Risk Adult (1 of 2 - PCV13) Never done  . TETANUS/TDAP  02/05/2017  . INFLUENZA VACCINE  03/30/2019    Patient Care Team: Copland, Gay Filler, MD as PCP - General (Family Medicine) Day, Melvenia Beam, [facility] as Pharmacist (Pharmacist)  Indicate any recent Medical Services you may have received from other than [facility] providers in the past year (date may be approximate).    Assessment:   This is a routine wellness examination for Kazumi. Physical assessment deferred to PCP.  Hearing/Vision screen Unable to assess. This visit is enabled though telemedicine due to Covid 19.   Dietary issues and exercise activities discussed: Current Exercise Habits: Home exercise routine, Type of exercise: walking, Time (Minutes): 60, Frequency (Times/Week): 5, Weekly Exercise (Minutes/Week): 300, Intensity: Mild, Exercise limited by: None identified Diet (meal preparation, eat out, water intake, caffeinated beverages, dairy products, fruits and vegetables): in general, a "healthy" diet  , well balanced     Goals    . A1c goal less than 6.5%     -The prediabetes range for a1c is 5.7% to 6.4%. Your most recent a1c was 6.1%.  -A full diabetes diagnosis usually occurs when your a1c hits 6.5%    . Complete fasting  lipid panel at next  office visit    . Increase physical activity as tolerated    . LDL goal less than 70     -With a history of heart disease it is advised to have a LDL level less than 70 to reduce your risk of further heart or brain complications    . Pharmacy Care Plan     Current Barriers:  . Chronic Disease Management support, education, and care coordination needs related to HTN, Pre-Diabetes, HLD, CAD, BPH, GERD, Glaucoma, Pain  Pharmacist Clinical Goal(s):  Marland Kitchen LDL goal <70 . Complete lipid panel at next office visit . A1c <6.5%  Interventions: . Comprehensive medication review performed. . Practice moderation with carbohydrates . Increase physical activity as tolerated  Patient Self Care Activities:  . Patient verbalizes understanding of plan to follow as described above, Self administers medications as prescribed, Calls pharmacy for medication refills, and Calls provider office for new concerns or questions  Initial goal documentation     . Practice moderation with carbohydrates      Depression Screen PHQ 2/9 Scores 11/15/2019 04/13/2016  PHQ - 2 Score 0 0    Fall Risk Fall Risk  11/15/2019 04/05/2016  Falls in the past year? 0 No  Number falls in past yr: 0 -  Injury with Fall? 0 -  Risk for fall due to : - Other (Comment)  Risk for fall due to: Comment - High risk based on ballance assessment: single leg stand less than 5 seconds.  Follow up Education provided;Falls prevention discussed -    Cognitive Function: Ad8 score reviewed for issues:  Issues making decisions:no  Less interest in hobbies / activities:no  Repeats questions, stories (family complaining):no  Trouble using ordinary gadgets (microwave, computer, phone):no  Forgets the month or year: no  Mismanaging finances: no  Remembering appts:no  Daily problems with thinking and/or memory:no Ad8 score is=0         Screening Tests Health Maintenance  Topic Date Due  . PNA vac Low Risk Adult (1 of 2 - PCV13)  Never done  . TETANUS/TDAP  02/05/2017  . INFLUENZA VACCINE  03/30/2019     Plan:    Please schedule your next medicare wellness visit with me in 1 yr.  Continue to eat heart healthy diet (full of fruits, vegetables, whole grains, lean protein, water--limit salt, fat, and sugar intake) and increase physical activity as tolerated.  Continue doing brain stimulating activities (puzzles, reading, adult coloring books, staying active) to keep memory sharp.     I have personally reviewed and noted the following in the patient's chart:   . Medical and social history . Use of alcohol, tobacco or illicit drugs  . Current medications and supplements . Functional ability and status . Nutritional status . Physical activity . Advanced directives . List of other physicians . Hospitalizations, surgeries, and ER visits in previous 12 months . Vitals . Screenings to include cognitive, depression, and falls . Referrals and appointments  In addition, I have reviewed and discussed with patient certain preventive protocols, quality metrics, and best practice recommendations. A written personalized care plan for preventive services as well as general preventive health recommendations were provided to patient.     Shela Nevin, [place]   11/15/2019

## 2019-11-15 ENCOUNTER — Ambulatory Visit (INDEPENDENT_AMBULATORY_CARE_PROVIDER_SITE_OTHER): Payer: PPO | Admitting: *Deleted

## 2019-11-15 ENCOUNTER — Encounter: Payer: Self-pay | Admitting: *Deleted

## 2019-11-15 ENCOUNTER — Other Ambulatory Visit: Payer: Self-pay

## 2019-11-15 DIAGNOSIS — Z0000 Encounter for general adult medical examination without abnormal findings: Secondary | ICD-10-CM

## 2019-11-15 NOTE — Patient Instructions (Signed)
Please schedule your next medicare wellness visit with me in 1 yr.  Continue to eat heart healthy diet (full of fruits, vegetables, whole grains, lean protein, water--limit salt, fat, and sugar intake) and increase physical activity as tolerated.  Continue doing brain stimulating activities (puzzles, reading, adult coloring books, staying active) to keep memory sharp.    Curtis Tyler , Thank you for taking time to come for your Medicare Wellness Visit. I appreciate your ongoing commitment to your health goals. Please review the following plan we discussed and let me know if I can assist you in the future.   These are the goals we discussed: Goals    . A1c goal less than 6.5%     -The prediabetes range for a1c is 5.7% to 6.4%. Your most recent a1c was 6.1%.  -A full diabetes diagnosis usually occurs when your a1c hits 6.5%    . Complete fasting lipid panel at next office visit    . Increase physical activity as tolerated    . LDL goal less than 70     -With a history of heart disease it is advised to have a LDL level less than 70 to reduce your risk of further heart or brain complications    . Pharmacy Care Plan     Current Barriers:  . Chronic Disease Management support, education, and care coordination needs related to HTN, Pre-Diabetes, HLD, CAD, BPH, GERD, Glaucoma, Pain  Pharmacist Clinical Goal(s):  Marland Kitchen LDL goal <70 . Complete lipid panel at next office visit . A1c <6.5%  Interventions: . Comprehensive medication review performed. . Practice moderation with carbohydrates . Increase physical activity as tolerated  Patient Self Care Activities:  . Patient verbalizes understanding of plan to follow as described above, Self administers medications as prescribed, Calls pharmacy for medication refills, and Calls provider office for new concerns or questions  Initial goal documentation     . Practice moderation with carbohydrates       This is a list of the screening recommended  for you and due dates:  Health Maintenance  Topic Date Due  . Pneumonia vaccines (1 of 2 - PCV13) Never done  . Tetanus Vaccine  02/05/2017  . Flu Shot  03/30/2019    Preventive Care 65 Years and Older, Male Preventive care refers to lifestyle choices and visits with your health care provider that can promote health and wellness. This includes:  A yearly physical exam. This is also called an annual well check.  Regular dental and eye exams.  Immunizations.  Screening for certain conditions.  Healthy lifestyle choices, such as diet and exercise. What can I expect for my preventive care visit? Physical exam Your health care provider will check:  Height and weight. These may be used to calculate body mass index (BMI), which is a measurement that tells if you are at a healthy weight.  Heart rate and blood pressure.  Your skin for abnormal spots. Counseling Your health care provider may ask you questions about:  Alcohol, tobacco, and drug use.  Emotional well-being.  Home and relationship well-being.  Sexual activity.  Eating habits.  History of falls.  Memory and ability to understand (cognition).  Work and work Statistician. What immunizations do I need?  Influenza (flu) vaccine  This is recommended every year. Tetanus, diphtheria, and pertussis (Tdap) vaccine  You may need a Td booster every 10 years. Varicella (chickenpox) vaccine  You may need this vaccine if you have not already been vaccinated. Zoster (shingles)  vaccine  You may need this after age 36. Pneumococcal conjugate (PCV13) vaccine  One dose is recommended after age 50. Pneumococcal polysaccharide (PPSV23) vaccine  One dose is recommended after age 14. Measles, mumps, and rubella (MMR) vaccine  You may need at least one dose of MMR if you were born in 1957 or later. You may also need a second dose. Meningococcal conjugate (MenACWY) vaccine  You may need this if you have certain  conditions. Hepatitis A vaccine  You may need this if you have certain conditions or if you travel or work in places where you may be exposed to hepatitis A. Hepatitis B vaccine  You may need this if you have certain conditions or if you travel or work in places where you may be exposed to hepatitis B. Haemophilus influenzae type b (Hib) vaccine  You may need this if you have certain conditions. You may receive vaccines as individual doses or as more than one vaccine together in one shot (combination vaccines). Talk with your health care provider about the risks and benefits of combination vaccines. What tests do I need? Blood tests  Lipid and cholesterol levels. These may be checked every 5 years, or more frequently depending on your overall health.  Hepatitis C test.  Hepatitis B test. Screening  Lung cancer screening. You may have this screening every year starting at age 74 if you have a 30-pack-year history of smoking and currently smoke or have quit within the past 15 years.  Colorectal cancer screening. All adults should have this screening starting at age 88 and continuing until age 5. Your health care provider may recommend screening at age 27 if you are at increased risk. You will have tests every 1-10 years, depending on your results and the type of screening test.  Prostate cancer screening. Recommendations will vary depending on your family history and other risks.  Diabetes screening. This is done by checking your blood sugar (glucose) after you have not eaten for a while (fasting). You may have this done every 1-3 years.  Abdominal aortic aneurysm (AAA) screening. You may need this if you are a current or former smoker.  Sexually transmitted disease (STD) testing. Follow these instructions at home: Eating and drinking  Eat a diet that includes fresh fruits and vegetables, whole grains, lean protein, and low-fat dairy products. Limit your intake of foods with high  amounts of sugar, saturated fats, and salt.  Take vitamin and mineral supplements as recommended by your health care provider.  Do not drink alcohol if your health care provider tells you not to drink.  If you drink alcohol: ? Limit how much you have to 0-2 drinks a day. ? Be aware of how much alcohol is in your drink. In the [place] one drink equals one 12 oz bottle of beer (355 mL), one 5 oz glass of wine (148 mL), or one 1 oz glass of hard liquor (44 mL). Lifestyle  Take daily care of your teeth and gums.  Stay active. Exercise for at least 30 minutes on 5 or more days each week.  Do not use any products that contain nicotine or tobacco, such as cigarettes, e-cigarettes, and chewing tobacco. If you need help quitting, ask your health care provider.  If you are sexually active, practice safe sex. Use a condom or other form of protection to prevent STIs (sexually transmitted infections).  Talk with your health care provider about taking a low-dose aspirin or statin. What's next?  Visit your  health care provider once a year for a well check visit.  Ask your health care provider how often you should have your eyes and teeth checked.  Stay up to date on all vaccines. This information is not intended to replace advice given to you by your health care provider. Make sure you discuss any questions you have with your health care provider. Document Revised: 08/09/2018 Document Reviewed: 08/09/2018 Elsevier Patient Education  2020 [employer].

## 2019-11-17 NOTE — Progress Notes (Signed)
[place] at [facility] [address] [place], [place] [address] [phone]  Date:  11/18/2019   Name:  Curtis Tyler   DOB:  [birth year]   MRN:  [id number]  PCP:  [name], MD    Chief Complaint: Hypertension (6 month follow up, bp fluctuating) and Lung Lesion   History of Present Illness:  Curtis Tyler is a 79 y.o. very pleasant male patient who presents with the following:  Patient here today for 36-month follow-up visit.  Last seen by myself in October History of prediabetes, CAD, hyperlipidemia, hypertension He recently came in for lab work which looked okay, A1c is still in the prediabetes range at 6%  He has been seeing Dr. Tarri Glenn with gastroenterology due to abdominal pain and nausea-most recent visit on March 9.  She thought that his sx might be partially due to anxiety. He has gallstones but does not need cholecystectomy -not thought to be causative He is taking Bentyl as needed and feels like it really helps him He was found to have a lung nodule on CT abd/ pelvis that needs follow-up; I will order for him  CT abd/ pelvis 10/01/19 IMPRESSION: 1. No acute findings in the abdomen or pelvis. 2. 10 x 5 mm right lower lobe pulmonary nodule. Non-contrast chest CT at 6-12 months is recommended. If the nodule is stable at time of repeat CT, then future CT at 18-24 months (from today's scan) is considered optional for low-risk patients, but is recommended for high-risk patients. This recommendation follows the consensus statement: Guidelines for Management of Incidental Pulmonary Nodules Detected on CT Images: From the Fleischner Society 2017; Radiology 2017; 284:228-243. 3. Cholelithiasis. 4.  Aortic Atherosclerois (ICD10-170.0)  Amlodipine Aspirin Zetia Gabapentin 300 twice daily Losartan Crestor Flomax Bentyl prn- does seem to be helping   Flu vaccine- done in the fall Tetanus vaccine- this is due   Pneumonia series- per pt series is complete Covid series- done with both  Shingrix- done   Colonoscopy up-to-date  His urologist changed his med from Doxazosin to flomax; he likes his results but his BP is more variable.  This change was made about a month ago His BP has been fluctuating more - his am readings have been high and afternoon readings low He is currently taking both amlodipine and losartan at nighttime, he will start taking one of them in the morning to see if this helps  He is a former smoker but quit 30 years ago   Patient Active Problem List   Diagnosis Date Noted  . Elevated bilirubin 05/21/2019  . Pre-diabetes 05/20/2019  . Dyslipidemia 05/03/2019  . Bradycardia 09/12/2018  . Plantar porokeratosis, acquired 05/16/2018  . CAD (coronary artery disease) 02/25/2016  . Abnormal stress ECG with treadmill 02/12/2016  . Colonic polyp 09/21/2015  . Left lower quadrant pain 09/03/2015  . Nasal congestion 09/03/2015  . Cellulitis of right lower extremity 05/14/2015  . Need for immunization against influenza 05/14/2015  . Bilateral low back pain with sciatica 09/03/2014  . Hip pain, bilateral 09/03/2014  . Bilateral carotid artery stenosis 07/21/2014  . Myalgia 07/21/2014  . Acute pain of left knee 07/15/2014  . Benign prostatic hyperplasia with lower urinary tract symptoms 07/15/2014  . Effusion of left knee 07/15/2014  . History of elevated prostate specific antigen (PSA) 07/15/2014  . Hypertrophy of nasal turbinates 07/15/2014  . 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 Incomplete emptying of bladder 07/15/2014  . Increased  urinary frequency 07/15/2014  . 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 Chest pain 09/05/2012  . Cough 08/06/2011  . Coronary artery disease involving native coronary artery without angina pectoris 04/26/2011  . Essential hypertension 04/26/2011  . Hyperlipidemia with target LDL less than 70 04/26/2011  . Cerebrovascular disease 04/26/2011     Past Medical History:  Diagnosis Date  . Arthritis   . BPH (benign prostatic hyperplasia)   . Bunion   . Callus   . Carotid artery occlusion   . Coronary artery disease    left main 60% stenosis. The LAD had a proximal 60-70% stenosis. There was mid 40% stenosis. Diagonal is moderate size with 40% stenosis. The circumflex system included a large OM with 60% ostial stenosis the RCA was occluded before the PDA. The EF was 65%.  2014  . Glaucoma    beginning of glaucoma- uses drops   . Hyperlipidemia   . Hypertension   . Myocardial infarct ([place]) 1992   inferior posterior- discovered in 1992-? when happened   . Obesity   . Radiculopathy   . Shortness of breath dyspnea    with exertion -     Past Surgical History:  Procedure Laterality Date  . APPENDECTOMY  1958  . CARDIAC CATHETERIZATION  05/2004   SHOWING 100% OCCLUDED DISTAL RIGHT CORONARY WITH  LEFT  TO RIGHT COLLATERALS, AS WELL AS ANTEGRADE FLOW, NORMAL LEFT MAIN, 50% NARROWING IN LEFT CIRCUMFLEX  WITH IRREGULARITIES IN THE LAD  . CARDIAC CATHETERIZATION N/A 02/12/2016   Procedure: Left Heart Cath and Coronary Angiography;  Surgeon: Leonie Man, MD;  Location: [place] [facility];  Service: Cardiovascular;  Laterality: N/A;  . CARDIOVASCULAR STRESS TEST  02/26/2010   EF 65%, SMALL AREA OF MILD INFARCT AND POSSIBLE PER-INFARCT ISCHEMIA IN THE INFEROBASAL WALL  . COLONOSCOPY    . CORONARY ANGIOPLASTY     RIGHT CORONARY WERE UNSUCCESSFUL  . CORONARY ARTERY BYPASS GRAFT N/A 02/25/2016   Procedure: CORONARY ARTERY BYPASS GRAFTING (CABG) times four using the left internal mammary artery and the right saphenus vein ;  Surgeon: [name], MD;  Location: [place];  Service: Open Heart Surgery;  Laterality: N/A;  . HERNIA REPAIR  1960   right hernia repair  . INTRAOPERATIVE TRANSESOPHAGEAL ECHOCARDIOGRAM N/A 02/25/2016   Procedure: INTRAOPERATIVE TRANSESOPHAGEAL ECHOCARDIOGRAM;  Surgeon: [name], MD;  Location:  [place];  Service: Open Heart Surgery;  Laterality: N/A;  . UMBILICAL HERNIA REPAIR    . US ECHOCARDIOGRAPHY  01/03/2007   EF 55-60%    Social History   Tobacco Use  . Smoking status: Former Smoker    Packs/day: 1.00    Years: 25.00    Pack years: 25.00    Types: Cigarettes    Quit date: 07/23/1981    Years since quitting: 38.3  . Smokeless tobacco: Never Used  Substance Use Topics  . Alcohol use: Yes    Alcohol/week: 8.0 standard drinks    Types: 7 Glasses of wine, 1 Cans of beer per week    Comment: glass of wine a night   . Drug use: No    Family History  Problem Relation Age of Onset  . Stroke Father   . Congestive Heart Failure Mother   . Diabetes Mother   . Hypertension Neg Hx   . Colon cancer Neg Hx   . Colon polyps Neg Hx   . Esophageal cancer Neg Hx   . Rectal cancer Neg Hx   . Stomach  cancer Neg Hx     No Known Allergies  Medication list has been reviewed and updated.  Current Outpatient Medications on File Prior to Visit  Medication Sig Dispense Refill  . amLODipine (NORVASC) 5 MG tablet Take 1 tablet (5 mg total) by mouth daily. 90 tablet 3  . aspirin 81 MG tablet Take 81 mg by mouth daily.      . Coenzyme Q10 100 MG TABS Take 100 mg by mouth daily.     Marland Kitchen dicyclomine (BENTYL) 10 MG capsule Take 1 pill by mouth every am. May take additional 3 times daily prn 50 capsule 3  . ezetimibe (ZETIA) 10 MG tablet Take 1 tablet (10 mg total) by mouth daily. 30 tablet 5  . finasteride (PROSCAR) 5 MG tablet Take 5 mg by mouth daily.      . fish oil-omega-3 fatty acids 1000 MG capsule Take 1,400 mg by mouth daily.     Marland Kitchen gabapentin (NEURONTIN) 300 MG capsule Take 1 capsule (300 mg total) by mouth 2 (two) times daily. 180 capsule 3  . Glucosamine-Chondroitin-MSM 500-200-150 MG TABS Take by mouth.    . latanoprost (XALATAN) 0.005 % ophthalmic solution Place 1 drop into both eyes daily.    Marland Kitchen losartan (COZAAR) 100 MG tablet Take 1 tablet (100 mg total) by mouth daily. 90  tablet 1  . Multiple Vitamins-Minerals (MULTIVITAMIN WITH MINERALS) tablet Take 1 tablet by mouth daily.    . Plant Sterols and Stanols (CHOLEST OFF) 450 MG TABS Take 900 mg by mouth daily.     . rosuvastatin (CRESTOR) 10 MG tablet Take 1 tablet (10 mg total) by mouth every other day. 90 tablet 0  . tadalafil (CIALIS) 5 MG tablet Take 1 tablet by mouth daily.    . tamsulosin (FLOMAX) 0.4 MG CAPS capsule Take 0.4 mg by mouth daily.     No current facility-administered medications on file prior to visit.    Review of Systems:  As per HPI- otherwise negative. No fever or chills He is walking 1 to 1-1/2 hours daily for exercise  Physical Examination: Vitals:   11/18/19 0859  BP: 121/74  Pulse: 63  Resp: 18  Temp: (!) 96.9 F (36.1 C)  SpO2: 98%   Vitals:   11/18/19 0859  Weight: 177 lb (80.3 kg)  Height: 5\' 2"  (1.575 m)   Body mass index is 32.37 kg/m. Ideal Body Weight: Weight in (lb) to have BMI = 25: 136.4  GEN: no acute distress. Obese, looks well  HEENT: Atraumatic, Normocephalic.   He has a bilateral cerumen impaction Ears and Nose: No external deformity. CV: RRR, No M/G/R. No JVD. No thrill. No extra heart sounds. PULM: CTA B, no wheezes, crackles, rhonchi. No retractions. No resp. distress. No accessory muscle use. ABD: S, NT, ND, +BS. No rebound. No HSM. EXTR: No c/c/e PSYCH: Normally interactive. Conversant.  Some earwax is present bilaterally -patient gave verbal consent for irrigation Both ears irrigated with warm water, cerumen removed successfully.  TM and ear canals are normal, patient noted improved hearing  Assessment and Plan: Bilateral impacted cerumen  Solitary pulmonary nodule - Plan: CT Chest Wo Contrast  Pre-diabetes  Essential hypertension  Hyperlipidemia with target LDL less than 70  Here today for follow-up visit.  Earwax irrigated and removed bilaterally with good results Discussed recent CT findings, there was a lung nodule noted in  the inferior right lung.  I ordered a follow-up CT to be done in August A1c is stable in  the prediabetes range-we will continue to monitor, he is getting plenty of exercise Blood pressures under good control today, we will have him take one of his blood pressure medicines in the morning and 1 in the evening to decrease variability Moderate medical decision making This visit occurred during the SARS-CoV-2 public health emergency.  Safety protocols were in place, including screening questions prior to the visit, additional usage of staff PPE, and extensive cleaning of exam room while observing appropriate contact time as indicated for disinfecting solutions.    Signed Lamar Blinks, MD

## 2019-11-18 ENCOUNTER — Ambulatory Visit (INDEPENDENT_AMBULATORY_CARE_PROVIDER_SITE_OTHER): Payer: PPO | Admitting: Family Medicine

## 2019-11-18 ENCOUNTER — Other Ambulatory Visit: Payer: Self-pay

## 2019-11-18 ENCOUNTER — Encounter: Payer: Self-pay | Admitting: Family Medicine

## 2019-11-18 VITALS — BP 121/74 | HR 63 | Temp 96.9°F | Resp 18 | Ht 62.0 in | Wt 177.0 lb

## 2019-11-18 DIAGNOSIS — I10 Essential (primary) hypertension: Secondary | ICD-10-CM | POA: Diagnosis not present

## 2019-11-18 DIAGNOSIS — R7303 Prediabetes: Secondary | ICD-10-CM

## 2019-11-18 DIAGNOSIS — R911 Solitary pulmonary nodule: Secondary | ICD-10-CM | POA: Diagnosis not present

## 2019-11-18 DIAGNOSIS — E785 Hyperlipidemia, unspecified: Secondary | ICD-10-CM

## 2019-11-18 DIAGNOSIS — H6123 Impacted cerumen, bilateral: Secondary | ICD-10-CM

## 2019-11-18 NOTE — Patient Instructions (Signed)
Good to see you again today I will set up a CT scan of your lungs to be done in August.  Please see me in 6 months.  Please get a tetanus booster at your drug store at your convenience  Your last A1c was in the pre-diabetes range- we will continue to monitor   Lab Results  Component Value Date   HGBA1C 6.0 11/14/2019

## 2019-12-05 ENCOUNTER — Ambulatory Visit: Payer: PPO | Admitting: Gastroenterology

## 2020-02-12 DIAGNOSIS — H903 Sensorineural hearing loss, bilateral: Secondary | ICD-10-CM | POA: Diagnosis not present

## 2020-02-23 ENCOUNTER — Other Ambulatory Visit: Payer: Self-pay | Admitting: Family Medicine

## 2020-02-26 DIAGNOSIS — H903 Sensorineural hearing loss, bilateral: Secondary | ICD-10-CM | POA: Diagnosis not present

## 2020-03-25 DIAGNOSIS — R3912 Poor urinary stream: Secondary | ICD-10-CM | POA: Diagnosis not present

## 2020-03-25 DIAGNOSIS — N401 Enlarged prostate with lower urinary tract symptoms: Secondary | ICD-10-CM | POA: Diagnosis not present

## 2020-03-29 NOTE — Patient Instructions (Addendum)
Good to see you again today!   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  Nothing to be alarmed about Your BP looks good- we refilled your medications today as below  Please consider getting a tetanus booster at your pharmacy at your convenience

## 2020-03-29 NOTE — Progress Notes (Signed)
[place] at [facility] [address], [place], [place] [address] [phone]  Date:  04/02/2020   Name:  Curtis Tyler   DOB:  [birth year]   MRN:  [id number]  PCP:  Curtis Mclean, MD    Chief Complaint: Medication Management   History of Present Illness:  Curtis Tyler is a 79 y.o. very pleasant male patient who presents with the following:  Periodic follow-up visit today I last saw Curtis Tyler in March of this year. 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  He counts his steps- gets 13k a day No CP or SOB   Tetanus vaccine-can be updated at pharmacy covid series done shingrix done Pt reports he had both his pneumonia vaccines already  Colon done in 2018 Patient actually came in for blood work yesterday as follows  His total bili is slightly high with normal direct bili, c/w gilbert syndrome   CT 10/01/19 IMPRESSION: 1. No acute findings in the abdomen or pelvis. 2. 10 x 5 mm right lower lobe pulmonary nodule. Non-contrast chest CT at 6-12 months is recommended. If the nodule is stable at time of repeat CT, then future CT at 18-24 months (from today's scan) is considered optional for low-risk patients, but is recommended for high-risk patients. This recommendation follows the consensus statement: Guidelines for Management of Incidental Pulmonary Nodules Detected on CT Images: From the Fleischner Society 2017; Radiology 2017;  284:228-243. 3. Cholelithiasis. 4.  Aortic Atherosclerois (ICD10-170.0)   Results for orders placed or performed in visit on 04/01/20  Hemoglobin A1c  Result Value Ref Range   Hgb A1c MFr Bld 6.3 4.6 - 6.5 %  Comprehensive metabolic panel  Result Value Ref Range   Sodium 135 135 - 145 mEq/L   Potassium 4.0 3.5 - 5.1 mEq/L   Chloride 101 96 - 112 mEq/L   CO2 25 19 - 32 mEq/L   Glucose, Bld 125 (H) 70 - 99 mg/dL   BUN 14 6 - 23 mg/dL   Creatinine, Ser 0.95 0.40 - 1.50 mg/dL   Total Bilirubin 1.4 (H) 0.2 - 1.2 mg/dL   Alkaline Phosphatase 72 39 - 117 U/L   AST 24 0 - 37 U/L   ALT 22 0 - 53 U/L   Total Protein 7.3 6.0 - 8.3 g/dL   Albumin 4.4 3.5 - 5.2 g/dL   GFR 75.99 >60.00 mL/min   Calcium 9.3 8.4 - 10.5 mg/dL  CBC  Result Value Ref Range   WBC 5.0 4.0 - 10.5 K/uL   RBC 5.14 4.22 - 5.81 Mil/uL   Platelets 155.0 150 - 400 K/uL   Hemoglobin 16.4 13.0 - 17.0 g/dL   HCT 48.1 39 - 52 %   MCV 93.6 78.0 - 100.0 fl   MCHC 34.0 30.0 - 36.0 g/dL   RDW 12.8 11.5 - 15.5 %    Patient Active Problem List   Diagnosis Date Noted  . Elevated bilirubin  05/21/2019  . Pre-diabetes 05/20/2019  . Dyslipidemia 05/03/2019  . Bradycardia 09/12/2018  . Plantar porokeratosis, acquired 05/16/2018  . CAD (coronary artery disease) 02/25/2016  . Abnormal stress ECG with treadmill 02/12/2016  . Colonic polyp 09/21/2015  . Left lower quadrant pain 09/03/2015  . Nasal congestion 09/03/2015  . Cellulitis of right lower extremity 05/14/2015  . Need for immunization against influenza 05/14/2015  . Bilateral low back pain with sciatica 09/03/2014  . Hip pain, bilateral 09/03/2014  . Bilateral carotid artery stenosis 07/21/2014  . Myalgia 07/21/2014  . Acute pain of left knee 07/15/2014  . Benign prostatic hyperplasia with lower urinary tract symptoms 07/15/2014  . Effusion of left knee 07/15/2014  . History of elevated prostate specific antigen (PSA) 07/15/2014  . Hypertrophy of nasal  turbinates 07/15/2014  . 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 Incomplete emptying of bladder 07/15/2014  . Increased urinary frequency 07/15/2014  . 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 Chest pain 09/05/2012  . Cough 08/06/2011  . Coronary artery disease involving native coronary artery without angina pectoris 04/26/2011  . Essential hypertension 04/26/2011  . Hyperlipidemia with target LDL less than 70 04/26/2011  . Cerebrovascular disease 04/26/2011    Past Medical History:  Diagnosis Date  . Arthritis   . BPH (benign prostatic hyperplasia)   . Bunion   . Callus   . Carotid artery occlusion   . Coronary artery disease    left main 60% stenosis. The LAD had a proximal 60-70% stenosis. There was mid 40% stenosis. Diagonal is moderate size with 40% stenosis. The circumflex system included a large OM with 60% ostial stenosis the RCA was occluded before the PDA. The EF was 65%.  2014  . Glaucoma    beginning of glaucoma- uses drops   . Hyperlipidemia   . Hypertension   . Myocardial infarct ([place]) 1992   inferior posterior- discovered in 1992-? when happened   . Obesity   . Radiculopathy   . Shortness of breath dyspnea    with exertion -     Past Surgical History:  Procedure Laterality Date  . APPENDECTOMY  1958  . CARDIAC CATHETERIZATION  05/2004   SHOWING 100% OCCLUDED DISTAL RIGHT CORONARY WITH  LEFT  TO RIGHT COLLATERALS, AS WELL AS ANTEGRADE FLOW, NORMAL LEFT MAIN, 50% NARROWING IN LEFT CIRCUMFLEX  WITH IRREGULARITIES IN THE LAD  . CARDIAC CATHETERIZATION N/A 02/12/2016   Procedure: Left Heart Cath and Coronary Angiography;  Surgeon: Leonie Man, MD;  Location: [place] [facility];  Service: Cardiovascular;  Laterality: N/A;  . CARDIOVASCULAR STRESS TEST  02/26/2010   EF 65%, SMALL AREA OF MILD INFARCT AND POSSIBLE PER-INFARCT ISCHEMIA IN THE INFEROBASAL WALL  . COLONOSCOPY    . CORONARY ANGIOPLASTY     RIGHT CORONARY WERE UNSUCCESSFUL  . CORONARY ARTERY BYPASS  GRAFT N/A 02/25/2016   Procedure: CORONARY ARTERY BYPASS GRAFTING (CABG) times four using the left internal mammary artery and the right saphenus vein ;  Surgeon: [name], MD;  Location: [facility];  Service: Open Heart Surgery;  Laterality: N/A;  . HERNIA REPAIR  1960   right hernia repair  . INTRAOPERATIVE TRANSESOPHAGEAL ECHOCARDIOGRAM N/A 02/25/2016   Procedure: INTRAOPERATIVE TRANSESOPHAGEAL ECHOCARDIOGRAM;  Surgeon: [name], MD;  Location: [place];  Service: Open Heart Surgery;  Laterality: N/A;  . UMBILICAL HERNIA REPAIR    . US ECHOCARDIOGRAPHY  01/03/2007   EF 55-60%    Social History   Tobacco Use  . Smoking status: Former  Smoker    Packs/day: 1.00    Years: 25.00    Pack years: 25.00    Types: Cigarettes    Quit date: 07/23/1981    Years since quitting: 38.7  . Smokeless tobacco: Never Used  Vaping Use  . Vaping Use: Never used  Substance Use Topics  . Alcohol use: Yes    Alcohol/week: 8.0 standard drinks    Types: 7 Glasses of wine, 1 Cans of beer per week    Comment: glass of wine a night   . Drug use: No    Family History  Problem Relation Age of Onset  . Stroke Father   . Congestive Heart Failure Mother   . Diabetes Mother   . Hypertension Neg Hx   . Colon cancer Neg Hx   . Colon polyps Neg Hx   . Esophageal cancer Neg Hx   . Rectal cancer Neg Hx   . Stomach cancer Neg Hx     No Known Allergies  Medication list has been reviewed and updated.  Current Outpatient Medications on File Prior to Visit  Medication Sig Dispense Refill  . amLODipine (NORVASC) 5 MG tablet Take 1 tablet (5 mg total) by mouth daily. 90 tablet 3  . aspirin 81 MG tablet Take 81 mg by mouth daily.      . Coenzyme Q10 100 MG TABS Take 100 mg by mouth daily.     Marland Kitchen dicyclomine (BENTYL) 10 MG capsule Take 1 pill by mouth every am. May take additional 3 times daily prn 50 capsule 3  . finasteride (PROSCAR) 5 MG tablet Take 5 mg by mouth daily.      . fish oil-omega-3  fatty acids 1000 MG capsule Take 1,400 mg by mouth daily.     Marland Kitchen gabapentin (NEURONTIN) 300 MG capsule Take 1 capsule (300 mg total) by mouth 2 (two) times daily. 180 capsule 3  . Glucosamine-Chondroitin-MSM 500-200-150 MG TABS Take by mouth.    . latanoprost (XALATAN) 0.005 % ophthalmic solution Place 1 drop into both eyes daily.    . Multiple Vitamins-Minerals (MULTIVITAMIN WITH MINERALS) tablet Take 1 tablet by mouth daily.    . Plant Sterols and Stanols (CHOLEST OFF) 450 MG TABS Take 900 mg by mouth daily.     . tadalafil (CIALIS) 5 MG tablet Take 1 tablet by mouth daily.    . tamsulosin (FLOMAX) 0.4 MG CAPS capsule Take 0.4 mg by mouth daily.     No current facility-administered medications on file prior to visit.    Review of Systems:  As per HPI- otherwise negative.   Physical Examination: Vitals:   04/02/20 1314  BP: 130/80  Pulse: 71  Resp: 17  Temp: 97.6 F (36.4 C)  SpO2: 97%   Vitals:   04/02/20 1314  Weight: 175 lb (79.4 kg)  Height: 5\' 2"  (1.575 m)   Body mass index is 32.01 kg/m. Ideal Body Weight: Weight in (lb) to have BMI = 25: 136.4  GEN: no acute distress. Mild obesity- looks well and active/ strong for age 3: Atraumatic, Normocephalic.  Bilateral TM wnl, oropharynx normal.  PEERL,EOMI.   Ears and Nose: No external deformity. CV: RRR, No M/G/R. No JVD. No thrill. No extra heart sounds. PULM: CTA B, no wheezes, crackles, rhonchi. No retractions. No resp. distress. No accessory muscle use. ABD: S, NT, ND, +BS. No rebound. No HSM. EXTR: No c/c/e PSYCH: Normally interactive. Conversant.    Assessment and Plan: Essential hypertension - Plan: losartan (COZAAR) 100 MG  tablet  Dyslipidemia - Plan: rosuvastatin (CRESTOR) 10 MG tablet, ezetimibe (ZETIA) 10 MG tablet  Pre-diabetes  Gilbert syndrome  Solitary pulmonary nodule - Plan: CT Chest Wo Contrast  Here today for follow-up visit Blood pressure under good control Refill blood pressure and  and lipid medications Went over recent lab work together Ordered CT to follow-up on pulmonary nodule Discussed Guilbert syndrome Follow-up 6 months Continue excellent exercise habits  This visit occurred during the SARS-CoV-2 public health emergency.  Safety protocols were in place, including screening questions prior to the visit, additional usage of staff PPE, and extensive cleaning of exam room while observing appropriate contact time as indicated for disinfecting solutions.    Signed Lamar Blinks, MD

## 2020-03-30 ENCOUNTER — Telehealth: Payer: Self-pay | Admitting: Family Medicine

## 2020-03-30 DIAGNOSIS — R7303 Prediabetes: Secondary | ICD-10-CM

## 2020-03-30 DIAGNOSIS — I10 Essential (primary) hypertension: Secondary | ICD-10-CM

## 2020-03-30 NOTE — Telephone Encounter (Signed)
New Message:   Pt is calling and would like to have lab orders put in for his appt on Thursday. He would like to come in on Wednesday. Pt would like a call when the orders are in. Please advise.

## 2020-03-30 NOTE — Telephone Encounter (Signed)
Please place labs if appropriate. I will call patient to get him scheduled for lab appointment.

## 2020-03-31 NOTE — Telephone Encounter (Signed)
Sent patient mychart message

## 2020-04-01 ENCOUNTER — Other Ambulatory Visit: Payer: Self-pay

## 2020-04-01 ENCOUNTER — Other Ambulatory Visit (INDEPENDENT_AMBULATORY_CARE_PROVIDER_SITE_OTHER): Payer: PPO

## 2020-04-01 DIAGNOSIS — I10 Essential (primary) hypertension: Secondary | ICD-10-CM | POA: Diagnosis not present

## 2020-04-01 DIAGNOSIS — R7303 Prediabetes: Secondary | ICD-10-CM

## 2020-04-01 LAB — COMPREHENSIVE METABOLIC PANEL
ALT: 22 U/L (ref 0–53)
AST: 24 U/L (ref 0–37)
Albumin: 4.4 g/dL (ref 3.5–5.2)
Alkaline Phosphatase: 72 U/L (ref 39–117)
BUN: 14 mg/dL (ref 6–23)
CO2: 25 mEq/L (ref 19–32)
Calcium: 9.3 mg/dL (ref 8.4–10.5)
Chloride: 101 mEq/L (ref 96–112)
Creatinine, Ser: 0.95 mg/dL (ref 0.40–1.50)
GFR: 75.99 mL/min (ref >60.00)
Glucose, Bld: 125 mg/dL — ABNORMAL HIGH (ref 70–99)
Potassium: 4 mEq/L (ref 3.5–5.1)
Sodium: 135 mEq/L (ref 135–145)
Total Bilirubin: 1.4 mg/dL — ABNORMAL HIGH (ref 0.2–1.2)
Total Protein: 7.3 g/dL (ref 6.0–8.3)

## 2020-04-01 LAB — CBC
HCT: 48.1 % (ref 39.0–52.0)
Hemoglobin: 16.4 g/dL (ref 13.0–17.0)
MCHC: 34 g/dL (ref 30.0–36.0)
MCV: 93.6 fl (ref 78.0–100.0)
Platelets: 155 10*3/uL (ref 150.0–400.0)
RBC: 5.14 Mil/uL (ref 4.22–5.81)
RDW: 12.8 % (ref 11.5–15.5)
WBC: 5 10*3/uL (ref 4.0–10.5)

## 2020-04-01 LAB — HEMOGLOBIN A1C: Hgb A1c MFr Bld: 6.3 % (ref 4.6–6.5)

## 2020-04-02 ENCOUNTER — Other Ambulatory Visit: Payer: Self-pay

## 2020-04-02 ENCOUNTER — Telehealth: Payer: Self-pay | Admitting: Pharmacist

## 2020-04-02 ENCOUNTER — Ambulatory Visit (INDEPENDENT_AMBULATORY_CARE_PROVIDER_SITE_OTHER): Payer: PPO | Admitting: Family Medicine

## 2020-04-02 VITALS — BP 130/80 | HR 71 | Temp 97.6°F | Resp 17 | Ht 62.0 in | Wt 175.0 lb

## 2020-04-02 DIAGNOSIS — E785 Hyperlipidemia, unspecified: Secondary | ICD-10-CM | POA: Diagnosis not present

## 2020-04-02 DIAGNOSIS — R7303 Prediabetes: Secondary | ICD-10-CM

## 2020-04-02 DIAGNOSIS — R911 Solitary pulmonary nodule: Secondary | ICD-10-CM

## 2020-04-02 DIAGNOSIS — I10 Essential (primary) hypertension: Secondary | ICD-10-CM | POA: Diagnosis not present

## 2020-04-02 MED ORDER — EZETIMIBE 10 MG PO TABS: 10.0000 mg | ORAL_TABLET | Freq: Every day | ORAL | 3 refills | Status: DC

## 2020-04-02 MED ORDER — ROSUVASTATIN CALCIUM 10 MG PO TABS: 10.0000 mg | ORAL_TABLET | ORAL | 3 refills | Status: DC

## 2020-04-02 MED ORDER — LOSARTAN POTASSIUM 100 MG PO TABS: 100.0000 mg | ORAL_TABLET | Freq: Every day | ORAL | 3 refills | Status: DC

## 2020-04-02 NOTE — Progress Notes (Addendum)
Chronic Care Management Pharmacy Assistant   Name: Curtis Tyler  MRN: [id number] DOB: 1941-02-06  Reason for Encounter: Appointment Reminder   PCP : [name], MD  Allergies:  No Known Allergies  Medications: Outpatient Encounter Medications as of 04/02/2020  Medication Sig Note  . amLODipine (NORVASC) 5 MG tablet Take 1 tablet (5 mg total) by mouth daily.   Marland Kitchen aspirin 81 MG tablet Take 81 mg by mouth daily.     . Coenzyme Q10 100 MG TABS Take 100 mg by mouth daily.    Marland Kitchen dicyclomine (BENTYL) 10 MG capsule Take 1 pill by mouth every am. May take additional 3 times daily prn   . ezetimibe (ZETIA) 10 MG tablet Take 1 tablet (10 mg total) by mouth daily.   . finasteride (PROSCAR) 5 MG tablet Take 5 mg by mouth daily.     . fish oil-omega-3 fatty acids 1000 MG capsule Take 1,400 mg by mouth daily.    Marland Kitchen gabapentin (NEURONTIN) 300 MG capsule Take 1 capsule (300 mg total) by mouth 2 (two) times daily. 10/10/2019: Takes 2 capsules HS  . Glucosamine-Chondroitin-MSM 500-200-150 MG TABS Take by mouth.   . latanoprost (XALATAN) 0.005 % ophthalmic solution Place 1 drop into both eyes daily.   Marland Kitchen losartan (COZAAR) 100 MG tablet Take 1 tablet (100 mg total) by mouth daily.   . Multiple Vitamins-Minerals (MULTIVITAMIN WITH MINERALS) tablet Take 1 tablet by mouth daily.   . Plant Sterols and Stanols (CHOLEST OFF) 450 MG TABS Take 900 mg by mouth daily.    . rosuvastatin (CRESTOR) 10 MG tablet Take 1 tablet (10 mg total) by mouth every other day.   . tadalafil (CIALIS) 5 MG tablet Take 1 tablet by mouth daily. 10/10/2019: Uses PRN  . tamsulosin (FLOMAX) 0.4 MG CAPS capsule Take 0.4 mg by mouth daily.    No facility-administered encounter medications on file as of 04/02/2020.    Current Diagnosis: Patient Active Problem List   Diagnosis Date Noted  . Elevated bilirubin 05/21/2019  . Pre-diabetes 05/20/2019  . Dyslipidemia 05/03/2019  . Bradycardia 09/12/2018  . Plantar porokeratosis,  acquired 05/16/2018  . CAD (coronary artery disease) 02/25/2016  . Abnormal stress ECG with treadmill 02/12/2016  . Colonic polyp 09/21/2015  . Left lower quadrant pain 09/03/2015  . Nasal congestion 09/03/2015  . Cellulitis of right lower extremity 05/14/2015  . Need for immunization against influenza 05/14/2015  . Bilateral low back pain with sciatica 09/03/2014  . Hip pain, bilateral 09/03/2014  . Bilateral carotid artery stenosis 07/21/2014  . Myalgia 07/21/2014  . Acute pain of left knee 07/15/2014  . Benign prostatic hyperplasia with lower urinary tract symptoms 07/15/2014  . Effusion of left knee 07/15/2014  . History of elevated prostate specific antigen (PSA) 07/15/2014  . Hypertrophy of nasal turbinates 07/15/2014  . 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 Incomplete emptying of bladder 07/15/2014  . Increased urinary frequency 07/15/2014  . 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 Chest pain 09/05/2012  . Cough 08/06/2011  . Coronary artery disease involving native coronary artery without angina pectoris 04/26/2011  . Essential hypertension 04/26/2011  . Hyperlipidemia with target LDL less than 70 04/26/2011  . Cerebrovascular disease 04/26/2011    Goals Addressed   None     Follow-Up:  Scheduled Follow-Up With Clinical Pharmacist   Contacted the patient to remind him of his telephone appointment with clinical pharmacist on Wednesday August 11th at 1:30pm.   Fanny Skates, [place] Pharmacist Assistant [phone]

## 2020-04-06 ENCOUNTER — Telehealth: Payer: Self-pay | Admitting: Pharmacist

## 2020-04-06 NOTE — Progress Notes (Addendum)
Chronic Care Management Pharmacy Assistant   Name: Braeton Wolgamott  MRN: [id number] DOB: [birth year]  Reason for Encounter: Patient returning my call  PCP : [name], MD  Allergies:  No Known Allergies  Medications: Outpatient Encounter Medications as of 04/06/2020  Medication Sig Note  . amLODipine (NORVASC) 5 MG tablet Take 1 tablet (5 mg total) by mouth daily.   Marland Kitchen aspirin 81 MG tablet Take 81 mg by mouth daily.     . Coenzyme Q10 100 MG TABS Take 100 mg by mouth daily.    Marland Kitchen dicyclomine (BENTYL) 10 MG capsule Take 1 pill by mouth every am. May take additional 3 times daily prn   . ezetimibe (ZETIA) 10 MG tablet Take 1 tablet (10 mg total) by mouth daily.   . finasteride (PROSCAR) 5 MG tablet Take 5 mg by mouth daily.     . fish oil-omega-3 fatty acids 1000 MG capsule Take 1,400 mg by mouth daily.    Marland Kitchen gabapentin (NEURONTIN) 300 MG capsule Take 1 capsule (300 mg total) by mouth 2 (two) times daily. 10/10/2019: Takes 2 capsules HS  . Glucosamine-Chondroitin-MSM 500-200-150 MG TABS Take by mouth.   . latanoprost (XALATAN) 0.005 % ophthalmic solution Place 1 drop into both eyes daily.   Marland Kitchen losartan (COZAAR) 100 MG tablet Take 1 tablet (100 mg total) by mouth daily.   . Multiple Vitamins-Minerals (MULTIVITAMIN WITH MINERALS) tablet Take 1 tablet by mouth daily.   . Plant Sterols and Stanols (CHOLEST OFF) 450 MG TABS Take 900 mg by mouth daily.    . rosuvastatin (CRESTOR) 10 MG tablet Take 1 tablet (10 mg total) by mouth every other day.   . tadalafil (CIALIS) 5 MG tablet Take 1 tablet by mouth daily. 10/10/2019: Uses PRN  . tamsulosin (FLOMAX) 0.4 MG CAPS capsule Take 0.4 mg by mouth daily.    No facility-administered encounter medications on file as of 04/06/2020.    Current Diagnosis: Patient Active Problem List   Diagnosis Date Noted  . Elevated bilirubin 05/21/2019  . Pre-diabetes 05/20/2019  . Dyslipidemia 05/03/2019  . Bradycardia 09/12/2018  . Plantar porokeratosis,  acquired 05/16/2018  . CAD (coronary artery disease) 02/25/2016  . Abnormal stress ECG with treadmill 02/12/2016  . Colonic polyp 09/21/2015  . Left lower quadrant pain 09/03/2015  . Nasal congestion 09/03/2015  . Cellulitis of right lower extremity 05/14/2015  . Need for immunization against influenza 05/14/2015  . Bilateral low back pain with sciatica 09/03/2014  . Hip pain, bilateral 09/03/2014  . Bilateral carotid artery stenosis 07/21/2014  . Myalgia 07/21/2014  . Acute pain of left knee 07/15/2014  . Benign prostatic hyperplasia with lower urinary tract symptoms 07/15/2014  . Effusion of left knee 07/15/2014  . History of elevated prostate specific antigen (PSA) 07/15/2014  . Hypertrophy of nasal turbinates 07/15/2014  . 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 Incomplete emptying of bladder 07/15/2014  . Increased urinary frequency 07/15/2014  . 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 Chest pain 09/05/2012  . Cough 08/06/2011  . Coronary artery disease involving native coronary artery without angina pectoris 04/26/2011  . Essential hypertension 04/26/2011  . Hyperlipidemia with target LDL less than 70 04/26/2011  . Cerebrovascular disease 04/26/2011    Goals Addressed   None    Patient returning my call. I left him a message to remind him of his appointment with the clinical pharmacist on Wednesday August 11th at 11:00am. Patient stated he does not want to be apart of CCM program  at all. He is happy with how things are currently, and does not want to make any changes. He asked to cancel his appointment and be unenrolled from the program.   Follow-Up:  Pharmacist Review   Fanny Skates, [place] Pharmacist Assistant [phone]

## 2020-04-08 ENCOUNTER — Ambulatory Visit (HOSPITAL_BASED_OUTPATIENT_CLINIC_OR_DEPARTMENT_OTHER)
Admission: RE | Admit: 2020-04-08 | Discharge: 2020-04-08 | Disposition: A | Discharge status: Home / Self Care | Payer: PPO | Source: Ambulatory Visit | Attending: Family Medicine | Admitting: Family Medicine

## 2020-04-08 ENCOUNTER — Telehealth: Payer: PPO

## 2020-04-08 ENCOUNTER — Other Ambulatory Visit: Payer: Self-pay

## 2020-04-08 DIAGNOSIS — R911 Solitary pulmonary nodule: Secondary | ICD-10-CM | POA: Insufficient documentation

## 2020-04-08 DIAGNOSIS — J432 Centrilobular emphysema: Secondary | ICD-10-CM | POA: Diagnosis not present

## 2020-04-08 DIAGNOSIS — J9811 Atelectasis: Secondary | ICD-10-CM | POA: Diagnosis not present

## 2020-04-08 DIAGNOSIS — I700 Atherosclerosis of aorta: Secondary | ICD-10-CM | POA: Diagnosis not present

## 2020-04-08 DIAGNOSIS — K449 Diaphragmatic hernia without obstruction or gangrene: Secondary | ICD-10-CM | POA: Diagnosis not present

## 2020-04-09 ENCOUNTER — Encounter: Payer: Self-pay | Admitting: Family Medicine

## 2020-04-10 ENCOUNTER — Encounter: Payer: Self-pay | Admitting: Family Medicine

## 2020-04-10 DIAGNOSIS — R911 Solitary pulmonary nodule: Secondary | ICD-10-CM | POA: Insufficient documentation

## 2020-05-11 DIAGNOSIS — Z7189 Other specified counseling: Secondary | ICD-10-CM | POA: Insufficient documentation

## 2020-05-11 NOTE — Progress Notes (Signed)
Cardiology Office Note   Date:  05/12/2020   ID:  Curtis Tyler, DOB Oct 10, 1940, MRN [id number]  PCP:  Curtis Mclean, MD  Cardiologist:   No primary care provider on file.   Chief Complaint  Patient presents with  . Coronary Artery Disease      History of Present Illness: Curtis Tyler is a 79 y.o. male who presents for follow up of CAD.  He had a cardiac cath in 2014 demonstrating left main 60% stenosis. The LAD had a proximal 60-70% stenosis. There was mid 40% stenosis. Diagonal is moderate size with 40% stenosis. The circumflex system included a large OM with 60% ostial stenosis the RCA was occluded before the PDA. The EF was 65%. However, we decided to manage him medically unless he had worsening symptoms. In May 2017 I sent him for a screening POET (Plain Old Exercise Treadmill).  This was abnormal with ST depression and was a high risk study. He then underwent cardiac catheterization where he was found to have a 60% ostial left main, a 90% proximal LAD, 90% proximal circumflex, and a diffusely diseased right coronary with a chronically totally occluded posterior descending.  He was taken the operating room on 02/25/2016 for CABG x 4 by Dr. Roxan Hockey.   He had bradycardia and I stopped his beta blocker.  A Holter demonstrated sinus with Mobitz Type 1.  He did have brief runs of 2:1 block without symptoms.  There were no sustained pauses.    Since I last saw him he has done very well.  The patient denies any new symptoms such as chest discomfort, neck or arm discomfort. There has been no new shortness of breath, PND or orthopnea. There have been no reported palpitations, presyncope or syncope.   Past Medical History:  Diagnosis Date  . Arthritis   . BPH (benign prostatic hyperplasia)   . Bunion   . Callus   . Carotid artery occlusion   . Coronary artery disease    left main 60% stenosis. The LAD had a proximal 60-70% stenosis. There was mid 40% stenosis. Diagonal is  moderate size with 40% stenosis. The circumflex system included a large OM with 60% ostial stenosis the RCA was occluded before the PDA. The EF was 65%.  2014  . Glaucoma    beginning of glaucoma- uses drops   . Hyperlipidemia   . Hypertension   . Myocardial infarct ([place]) 1992   inferior posterior- discovered in 1992-? when happened   . Obesity   . Radiculopathy   . Shortness of breath dyspnea    with exertion -     Past Surgical History:  Procedure Laterality Date  . APPENDECTOMY  1958  . CARDIAC CATHETERIZATION  05/2004   SHOWING 100% OCCLUDED DISTAL RIGHT CORONARY WITH  LEFT  TO RIGHT COLLATERALS, AS WELL AS ANTEGRADE FLOW, NORMAL LEFT MAIN, 50% NARROWING IN LEFT CIRCUMFLEX  WITH IRREGULARITIES IN THE LAD  . CARDIAC CATHETERIZATION N/A 02/12/2016   Procedure: Left Heart Cath and Coronary Angiography;  Surgeon: Curtis Man, MD;  Location: [facility];  Service: Cardiovascular;  Laterality: N/A;  . CARDIOVASCULAR STRESS TEST  02/26/2010   EF 65%, SMALL AREA OF MILD INFARCT AND POSSIBLE PER-INFARCT ISCHEMIA IN THE INFEROBASAL WALL  . COLONOSCOPY    . CORONARY ANGIOPLASTY     RIGHT CORONARY WERE UNSUCCESSFUL  . CORONARY ARTERY BYPASS GRAFT N/A 02/25/2016   Procedure: CORONARY ARTERY BYPASS GRAFTING (CABG) times four using the left internal  mammary artery and the right saphenus vein ;  Surgeon: Curtis Nakayama, MD;  Location: [facility];  Service: Open Heart Surgery;  Laterality: N/A;  . HERNIA REPAIR  1960   right hernia repair  . INTRAOPERATIVE TRANSESOPHAGEAL ECHOCARDIOGRAM N/A 02/25/2016   Procedure: INTRAOPERATIVE TRANSESOPHAGEAL ECHOCARDIOGRAM;  Surgeon: Curtis Nakayama, MD;  Location: [facility];  Service: Open Heart Surgery;  Laterality: N/A;  . UMBILICAL HERNIA REPAIR    . US ECHOCARDIOGRAPHY  01/03/2007   EF 55-60%     Current Outpatient Medications  Medication Sig Dispense Refill  . amLODipine (NORVASC) 5 MG tablet Take 1 tablet (5 mg total) by mouth daily. 90  tablet 3  . aspirin 81 MG tablet Take 81 mg by mouth daily.      . Coenzyme Q10 100 MG TABS Take 100 mg by mouth daily.     Marland Kitchen dicyclomine (BENTYL) 10 MG capsule Take 1 pill by mouth every am. May take additional 3 times daily prn 50 capsule 3  . ezetimibe (ZETIA) 10 MG tablet Take 1 tablet (10 mg total) by mouth daily. 90 tablet 3  . finasteride (PROSCAR) 5 MG tablet Take 5 mg by mouth daily.      . fish oil-omega-3 fatty acids 1000 MG capsule Take 1,400 mg by mouth daily.     Marland Kitchen gabapentin (NEURONTIN) 300 MG capsule Take 1 capsule (300 mg total) by mouth 2 (two) times daily. 180 capsule 3  . Glucosamine-Chondroitin-MSM 500-200-150 MG TABS Take by mouth.    . latanoprost (XALATAN) 0.005 % ophthalmic solution Place 1 drop into both eyes daily.    Marland Kitchen losartan (COZAAR) 100 MG tablet Take 1 tablet (100 mg total) by mouth daily. 90 tablet 3  . Multiple Vitamins-Minerals (MULTIVITAMIN WITH MINERALS) tablet Take 1 tablet by mouth daily.    . Plant Sterols and Stanols (CHOLEST OFF) 450 MG TABS Take 900 mg by mouth daily.     . rosuvastatin (CRESTOR) 10 MG tablet Take 1 tablet (10 mg total) by mouth every other day. 45 tablet 3  . tadalafil (CIALIS) 5 MG tablet Take 1 tablet by mouth daily.    . tamsulosin (FLOMAX) 0.4 MG CAPS capsule Take 0.4 mg by mouth daily.     No current facility-administered medications for this visit.    Allergies:   Patient has no known allergies.    ROS:  Please see the history of present illness.   Otherwise, review of systems are positive for hemorrhoids.   All other systems are reviewed and negative.    PHYSICAL EXAM: VS:  BP 132/64   Pulse 75   Ht 5\' 2"  (1.575 m)   Wt 179 lb (81.2 kg)   SpO2 97%   BMI 32.74 kg/m  , BMI Body mass index is 32.74 kg/m. GENERAL:  Well appearing NECK:  No jugular venous distention, waveform within normal limits, carotid upstroke brisk and symmetric, bilateral carotid bruits, no thyromegaly LUNGS:  Clear to auscultation  bilaterally CHEST:  Well healed sternotomy scar. HEART:  PMI not displaced or sustained,S1 and S2 within normal limits, no S3, no S4, no clicks, no rubs, no murmurs ABD:  Flat, positive bowel sounds normal in frequency in pitch, no bruits, no rebound, no guarding, no midline pulsatile mass, no hepatomegaly, no splenomegaly EXT:  2 plus pulses throughout, no edema, no cyanosis no clubbing   EKG:  EKG is ordered today. The ekg ordered today demonstrates sinus rhythm, rate 75, incomplete right bundle branch block, no acute  ST-T wave changes.   Recent Labs: 04/01/2020: ALT 22; BUN 14; Creatinine, Ser 0.95; Hemoglobin 16.4; Platelets 155.0; Potassium 4.0; Sodium 135    Lipid Panel    Component Value Date/Time   CHOL 169 11/14/2019 0921   TRIG 91.0 11/14/2019 0921   HDL 59.20 11/14/2019 0921   CHOLHDL 3 11/14/2019 0921   VLDL 18.2 11/14/2019 0921   LDLCALC 92 11/14/2019 0921      Wt Readings from Last 3 Encounters:  05/12/20 179 lb (81.2 kg)  04/02/20 175 lb (79.4 kg)  11/18/19 177 lb (80.3 kg)      Other studies Reviewed: Additional studies/ records that were reviewed today include: Labs. Review of the above records demonstrates:  Please see elsewhere in the note.     ASSESSMENT AND PLAN:  BRADYCARDIA:    Has had no symptoms related to this.  No change in therapy.  CAD s/p CABG:    The patient has no new sypmtoms.  No further cardiovascular testing is indicated.  We will continue with aggressive risk reduction and meds as listed.  HTN:   The blood pressure is at target.  No change in therapy.   HYPERLIPIDEMIA:     LDL is mildly elevated.  However, he does not tolerate increased statin.  His HDL is above 50.  I am not going to change.  I did add Zetia at the last visit.   CAROTID STENOSIS:     He has had mild plaque.  No further imaging.  COVID EDUCATION: He has had vaccine.  We did talk about booster shots.  Current medicines are reviewed at length with the patient  today.  The patient does not have concerns regarding medicines.  The following changes have been made:  no change  Labs/ tests ordered today include: None  Orders Placed This Encounter  Procedures  . EKG 12-Lead     Disposition:   FU with me in 12 months    Signed, Minus Breeding, MD  05/12/2020 2:26 PM    [facility]

## 2020-05-12 ENCOUNTER — Encounter: Payer: Self-pay | Admitting: Cardiology

## 2020-05-12 ENCOUNTER — Ambulatory Visit (INDEPENDENT_AMBULATORY_CARE_PROVIDER_SITE_OTHER): Payer: PPO | Admitting: Cardiology

## 2020-05-12 ENCOUNTER — Telehealth: Payer: Self-pay | Admitting: Gastroenterology

## 2020-05-12 ENCOUNTER — Other Ambulatory Visit: Payer: Self-pay

## 2020-05-12 VITALS — BP 132/64 | HR 75 | Ht 62.0 in | Wt 179.0 lb

## 2020-05-12 DIAGNOSIS — Z7189 Other specified counseling: Secondary | ICD-10-CM

## 2020-05-12 DIAGNOSIS — I10 Essential (primary) hypertension: Secondary | ICD-10-CM | POA: Diagnosis not present

## 2020-05-12 DIAGNOSIS — E785 Hyperlipidemia, unspecified: Secondary | ICD-10-CM

## 2020-05-12 DIAGNOSIS — R001 Bradycardia, unspecified: Secondary | ICD-10-CM

## 2020-05-12 DIAGNOSIS — I2510 Atherosclerotic heart disease of native coronary artery without angina pectoris: Secondary | ICD-10-CM

## 2020-05-12 NOTE — Telephone Encounter (Signed)
Pt is requesting a recommendation to treat his external hemorrhoids.

## 2020-05-12 NOTE — Patient Instructions (Signed)
Medication Instructions:  CONTINUE CURRENT MEDICATION. *If you need a refill on your cardiac medications before your next appointment, please call your pharmacy*   Lab Work: NONE If you have labs (blood work) drawn today and your tests are completely normal, you will receive your results only by: Marland Kitchen MyChart Message (if you have MyChart) OR . A paper copy in the mail If you have any lab test that is abnormal or we need to change your treatment, we will call you to review the results.      Follow-Up: At [facility], you and your health needs are our priority.  As part of our continuing mission to provide you with exceptional heart care, we have created designated Provider Care Teams.  These Care Teams include your primary Cardiologist (physician) and Advanced Practice Providers (APPs -  Physician Assistants and Nurse Practitioners) who all work together to provide you with the care you need, when you need it.  We recommend signing up for the patient portal called "MyChart".  Sign up information is provided on this After Visit Summary.  MyChart is used to connect with patients for Virtual Visits (Telemedicine).  Patients are able to view lab/test results, encounter notes, upcoming appointments, etc.  Non-urgent messages can be sent to your provider as well.   To learn more about what you can do with MyChart, go to [email or web address].    Your next appointment:   12 month(s)  The format for your next appointment:   In Person  Provider:   Minus Breeding, MD

## 2020-05-12 NOTE — Telephone Encounter (Signed)
Spoke with pt and discussed with him he can soak in a warm tub a few times a day for 63min, use tucs pads, prep h supp and/or cream and wipes instead of toilet paper. Pt verbalized understanding. He will call back if he continues to have issues.

## 2020-05-20 ENCOUNTER — Ambulatory Visit: Payer: PPO | Admitting: Gastroenterology

## 2020-05-20 ENCOUNTER — Encounter: Payer: Self-pay | Admitting: Gastroenterology

## 2020-05-20 VITALS — BP 142/64 | HR 70 | Ht 62.0 in | Wt 181.4 lb

## 2020-05-20 DIAGNOSIS — R110 Nausea: Secondary | ICD-10-CM | POA: Diagnosis not present

## 2020-05-20 DIAGNOSIS — R102 Pelvic and perineal pain: Secondary | ICD-10-CM | POA: Diagnosis not present

## 2020-05-20 NOTE — Progress Notes (Signed)
Perineum

## 2020-05-20 NOTE — Patient Instructions (Signed)
If you are age 81 or older, your body mass index should be between 23-30. Your Body mass index is 33.17 kg/m. If this is out of the aforementioned range listed, please consider follow up with your Primary Care Provider.  If you are age 37 or younger, your body mass index should be between 19-25. Your Body mass index is 33.17 kg/m. If this is out of the aformentioned range listed, please consider follow up with your Primary Care Provider.   It has been recommended to you by your physician that you have a colonoscopy completed. Per your request, we did not schedule the procedure(s) today. Please contact our office at [phone] should you decide to have the procedure completed. You will be scheduled for a pre-visit and procedure at that time.  Thank you for entrusting me with your care and choosing [facility].  Dr Tarri Glenn

## 2020-05-20 NOTE — Progress Notes (Signed)
Referring Provider: [name], MD Primary Care Physician:  [name], MD  Chief complaint: I think I have hemorrhoids   IMPRESSION:  Perineal pain and discomfort History of hemorrhoidectomy in the distant past Early morning nausea without alarm features, controlled on dicyclomine History of left-sided abdominal pain Reflux with tiny hiatal hernia on UGI series History of colon polyps -Initially diagnosed on colonoscopy at age 79 -Most recently had 3 tubular adenomas removed on colonoscopy with Dr. Penelope Coop 2018 -Surveillance recommended in 3 years Cholelithiasis without evidence for cholecystitis  Perineal pain: Localized pain at the raphe perinealis. This is not due to hemorrhoids or perianal disease. He has an appointment with his Urologist on Friday and will discuss it at that time.   Early morning nausea not associated with eating and LUQ pain. Feeling better with the addition of dicyclomine.   He has a personal history of colon polyps. Surveillance colonoscopy is due this year if he would like to proceed. He is still interested in disease prevention.  PLAN: - Follow with [facility] as planned later this week (Dr. Diona Fanti) - Surveillance colonoscopy at his convenience   I spent 30 minutes of time, including independent review of results as outlined above, communicating results with the patient directly, face-to-face time with the patient, coordinating care, ordering studies and medications as appropriate, and documentation.    HPI: Curtis Tyler is a 79 y.o. male. Retired Lexicographer x 10 years. He originally sought care for a second opinion regarding a 7-8 month history of early morning nausea and left-sided upper abdominal pain. He was last seen as a virtual visit 11/05/19.  The interval history obtained through the patient and review of his electronic health record. He has CAD, cerebrovascular disease, hypertension, dyslipidemia, prediabetes,  Gilbert's syndrome.  Evaluation has included: - UGI series 05/30/2019: tiny hiatal hernia, normal esophageal motility, mild reflux Abdominal ultrasound 06/11/19: multiple gallstones measuring up to 1.4 cm, GB sludge, echogenic liver, renal cysts - Surgical  onsultation with [facility] did not feel this was related to gallbladder disease.  - EGD 09/04/19: small hiatal hernia and mild gastropathy. Biopsies were negative for reflux, eosinophilic esophagitis, or H pylori.   - CT abd/pelvis with contrast 10/01/19: no acute findings in the abdomen or pelvis.  A 10 x 5 mm right lower lobe pulmonary nodule was identified.  Radiologist recommended repeat CT scan at 6 to 12 months.  Other findings included cholelithiasis and aortic atherosclerosis. - Chest chest without contrast 04/08/20: stable right lower lobe nodule, small hiatal hernia with mild distal esophageal wall thickening, and no evident adenopathy.  At the time of his initial consultation with me his pantoprazole was increased from 40 mg QD to mg BID. He was asked to continue famotidine BID. He is sleeping on a wedge pillow. We had discussed but not pursued trials of dicyclomine, Buspar, Zofran, and Xifaxan. Today he reports that his symptoms are now rare and well controlled with dicyclomine PRN.   This appointment was scheduled when he called last week with concerns about external hemorrhoids. Distant history of hemorrhoidectomy over 10 years ago in [facility]. Noted rectal fullness on wiping last week. Associated itching. Relieved with pressure and Sitz bath. No blood or mucous. Using Tucks wipes and Preparation H cream but this caused some burning and did not provide relief. No change in bwoel habits. No straining.   Past Medical History:  Diagnosis Date  . Arthritis   . BPH (benign prostatic hyperplasia)   .  Bunion   . Callus   . Carotid artery occlusion   . Coronary artery disease    left main 60% stenosis. The LAD had a  proximal 60-70% stenosis. There was mid 40% stenosis. Diagonal is moderate size with 40% stenosis. The circumflex system included a large OM with 60% ostial stenosis the RCA was occluded before the PDA. The EF was 65%.  2014  . Glaucoma    beginning of glaucoma- uses drops   . Hyperlipidemia   . Hypertension   . Myocardial infarct ([place]) 1992   inferior posterior- discovered in 1992-? when happened   . Obesity   . Radiculopathy   . Shortness of breath dyspnea    with exertion -     Past Surgical History:  Procedure Laterality Date  . APPENDECTOMY  1958  . CARDIAC CATHETERIZATION  05/2004   SHOWING 100% OCCLUDED DISTAL RIGHT CORONARY WITH  LEFT  TO RIGHT COLLATERALS, AS WELL AS ANTEGRADE FLOW, NORMAL LEFT MAIN, 50% NARROWING IN LEFT CIRCUMFLEX  WITH IRREGULARITIES IN THE LAD  . CARDIAC CATHETERIZATION N/A 02/12/2016   Procedure: Left Heart Cath and Coronary Angiography;  Surgeon: Leonie Man, MD;  Location: [facility];  Service: Cardiovascular;  Laterality: N/A;  . CARDIOVASCULAR STRESS TEST  02/26/2010   EF 65%, SMALL AREA OF MILD INFARCT AND POSSIBLE PER-INFARCT ISCHEMIA IN THE INFEROBASAL WALL  . COLONOSCOPY    . CORONARY ANGIOPLASTY     RIGHT CORONARY WERE UNSUCCESSFUL  . CORONARY ARTERY BYPASS GRAFT N/A 02/25/2016   Procedure: CORONARY ARTERY BYPASS GRAFTING (CABG) times four using the left internal mammary artery and the right saphenus vein ;  Surgeon: [name], MD;  Location: [facility];  Service: Open Heart Surgery;  Laterality: N/A;  . HERNIA REPAIR  1960   right hernia repair  . INTRAOPERATIVE TRANSESOPHAGEAL ECHOCARDIOGRAM N/A 02/25/2016   Procedure: INTRAOPERATIVE TRANSESOPHAGEAL ECHOCARDIOGRAM;  Surgeon: [name], MD;  Location: [facility];  Service: Open Heart Surgery;  Laterality: N/A;  . UMBILICAL HERNIA REPAIR    . US ECHOCARDIOGRAPHY  01/03/2007   EF 55-60%    Current Outpatient Medications  Medication Sig Dispense Refill  . amLODipine  (NORVASC) 5 MG tablet Take 1 tablet (5 mg total) by mouth daily. 90 tablet 3  . aspirin 81 MG tablet Take 81 mg by mouth daily.      . Coenzyme Q10 100 MG TABS Take 100 mg by mouth daily.     Marland Kitchen dicyclomine (BENTYL) 10 MG capsule Take 1 pill by mouth every am. May take additional 3 times daily prn 50 capsule 3  . ezetimibe (ZETIA) 10 MG tablet Take 1 tablet (10 mg total) by mouth daily. 90 tablet 3  . finasteride (PROSCAR) 5 MG tablet Take 5 mg by mouth daily.      . fish oil-omega-3 fatty acids 1000 MG capsule Take 1,400 mg by mouth daily.     Marland Kitchen gabapentin (NEURONTIN) 300 MG capsule Take 1 capsule (300 mg total) by mouth 2 (two) times daily. 180 capsule 3  . Glucosamine-Chondroitin-MSM 500-200-150 MG TABS Take by mouth.    . latanoprost (XALATAN) 0.005 % ophthalmic solution Place 1 drop into both eyes daily.    Marland Kitchen losartan (COZAAR) 100 MG tablet Take 1 tablet (100 mg total) by mouth daily. 90 tablet 3  . Multiple Vitamins-Minerals (MULTIVITAMIN WITH MINERALS) tablet Take 1 tablet by mouth daily.    . Plant Sterols and Stanols (CHOLEST OFF) 450 MG TABS Take 900  mg by mouth daily.     . rosuvastatin (CRESTOR) 10 MG tablet Take 1 tablet (10 mg total) by mouth every other day. 45 tablet 3  . tadalafil (CIALIS) 5 MG tablet Take 1 tablet by mouth daily.    . tamsulosin (FLOMAX) 0.4 MG CAPS capsule Take 0.4 mg by mouth daily.     No current facility-administered medications for this visit.    Allergies as of 05/20/2020  . (No Known Allergies)    Family History  Problem Relation Age of Onset  . Stroke Father   . Congestive Heart Failure Mother   . Diabetes Mother   . Hypertension Neg Hx   . Colon cancer Neg Hx   . Colon polyps Neg Hx   . Esophageal cancer Neg Hx   . Rectal cancer Neg Hx   . Stomach cancer Neg Hx     Social History   Socioeconomic History  . Marital status: Married    Spouse name: Sharyn Lull  . Number of children: 2  . Years of education: HS  . Highest education  level: Not on file  Occupational History  . Occupation: retired  Tobacco Use  . Smoking status: Former Smoker    Packs/day: 1.00    Years: 25.00    Pack years: 25.00    Types: Cigarettes    Quit date: 07/23/1981    Years since quitting: 38.8  . Smokeless tobacco: Never Used  Vaping Use  . Vaping Use: Never used  Substance and Sexual Activity  . Alcohol use: Yes    Alcohol/week: 8.0 standard drinks    Types: 7 Glasses of wine, 1 Cans of beer per week    Comment: glass of wine a night   . Drug use: No  . Sexual activity: Yes  Other Topics Concern  . Not on file  Social History Narrative   Positive HTN and Stroke among his parents who lived up until their late 11's   Patient lives at home with his spouse.   Caffeine Use: 2-4 cups daily   Social Determinants of Health   Financial Resource Strain: Low Risk   . Difficulty of Paying Living Expenses: Not hard at all  Food Insecurity: No Food Insecurity  . Worried About Charity fundraiser in the Last Year: Never true  . Ran Out of Food in the Last Year: Never true  Transportation Needs: No Transportation Needs  . Lack of Transportation (Medical): No  . Lack of Transportation (Non-Medical): No  Physical Activity:   . Days of Exercise per Week: Not on file  . Minutes of Exercise per Session: Not on file  Stress:   . Feeling of Stress : Not on file  Social Connections:   . Frequency of Communication with Friends and Family: Not on file  . Frequency of Social Gatherings with Friends and Family: Not on file  . Attends Religious Services: Not on file  . Active Member of Clubs or Organizations: Not on file  . Attends Archivist Meetings: Not on file  . Marital Status: Not on file  Intimate Partner Violence:   . Fear of Current or Ex-Partner: Not on file  . Emotionally Abused: Not on file  . Physically Abused: Not on file  . Sexually Abused: Not on file     Physical Exam: Gen: Awake, alert, and oriented, and well  communicative. HEENT: EOMI, non-icteric sclera, NCAT, MMM  Abd: soft, NT, ND, normal bowel sounds Rectal:  No chemical dermatitis. External  skin tags present. External hemorrhoids present that are not bleeding or thrombosed. No fissure or fistula. No rectal prolapse. There is pain along a linear ridge at the raphe perinealis with no overlying erythema, fluctuance or warmth. Chaperone: Maya Derm: No apparent lesions or bruising in visible field  MS: Moves all visible extremities without noticeable abnormality  Psych: Pleasant, cooperative, normal speech, thought processing seemingly intact      Zakaree Mcclenahan L. Tarri Glenn, MD, MPH 05/20/2020, 10:44 AM

## 2020-05-22 DIAGNOSIS — N401 Enlarged prostate with lower urinary tract symptoms: Secondary | ICD-10-CM | POA: Diagnosis not present

## 2020-05-22 DIAGNOSIS — R3912 Poor urinary stream: Secondary | ICD-10-CM | POA: Diagnosis not present

## 2020-07-07 DIAGNOSIS — H5203 Hypermetropia, bilateral: Secondary | ICD-10-CM | POA: Diagnosis not present

## 2020-07-07 DIAGNOSIS — H52203 Unspecified astigmatism, bilateral: Secondary | ICD-10-CM | POA: Diagnosis not present

## 2020-07-07 DIAGNOSIS — H524 Presbyopia: Secondary | ICD-10-CM | POA: Diagnosis not present

## 2020-07-07 DIAGNOSIS — H401131 Primary open-angle glaucoma, bilateral, mild stage: Secondary | ICD-10-CM | POA: Diagnosis not present

## 2020-07-18 ENCOUNTER — Other Ambulatory Visit: Payer: Self-pay | Admitting: Cardiology

## 2020-08-03 ENCOUNTER — Telehealth: Payer: Self-pay

## 2020-08-03 DIAGNOSIS — R3912 Poor urinary stream: Secondary | ICD-10-CM | POA: Diagnosis not present

## 2020-08-03 DIAGNOSIS — R110 Nausea: Secondary | ICD-10-CM

## 2020-08-03 DIAGNOSIS — D126 Benign neoplasm of colon, unspecified: Secondary | ICD-10-CM

## 2020-08-03 DIAGNOSIS — N401 Enlarged prostate with lower urinary tract symptoms: Secondary | ICD-10-CM | POA: Diagnosis not present

## 2020-08-03 MED ORDER — SUTAB 1479-225-188 MG PO TABS: 1.0000 | ORAL_TABLET | ORAL | 0 refills | Status: DC

## 2020-08-03 NOTE — Telephone Encounter (Signed)
-----   Message from Stevan Born, [place] sent at 05/20/2020 11:17 AM EDT ----- Regarding: schedule colonoscopy Has patient been scheduled for colonoscopy in Jan 2022?  He was supposed to call back per Dr Tarri Glenn

## 2020-08-03 NOTE — Telephone Encounter (Signed)
Phone call to patient was made regarding getting a colonoscopy scheduled in January 2022 per Dr Tarri Glenn.  Patient was seen in September 2021 by Dr Tarri Glenn, but preferred to wait until January 2021 to schedule.  Patient states that he would like to schedule at the end of the month. Patient scheduled for 09-22-2020 at 10:00am.  Instructions reviewed with patient by phone and sent in [place].  Patient will come by the office after August 29, 2020 when he is back in town to sign consent forms and pick up hard copy of instructions.  Patient agreed to plan and verbalized understanding.  No further questions.

## 2020-09-15 ENCOUNTER — Encounter: Payer: Self-pay | Admitting: Gastroenterology

## 2020-09-22 ENCOUNTER — Other Ambulatory Visit: Payer: Self-pay

## 2020-09-22 ENCOUNTER — Ambulatory Visit (AMBULATORY_SURGERY_CENTER): Payer: PPO | Admitting: Gastroenterology

## 2020-09-22 ENCOUNTER — Encounter: Payer: Self-pay | Admitting: Gastroenterology

## 2020-09-22 ENCOUNTER — Other Ambulatory Visit: Payer: Self-pay | Admitting: Gastroenterology

## 2020-09-22 VITALS — BP 143/75 | HR 64 | Temp 98.4°F | Resp 23 | Ht 62.0 in | Wt 181.0 lb

## 2020-09-22 DIAGNOSIS — D124 Benign neoplasm of descending colon: Secondary | ICD-10-CM

## 2020-09-22 DIAGNOSIS — D122 Benign neoplasm of ascending colon: Secondary | ICD-10-CM

## 2020-09-22 DIAGNOSIS — Z86010 Personal history of colonic polyps: Secondary | ICD-10-CM | POA: Diagnosis not present

## 2020-09-22 DIAGNOSIS — R102 Pelvic and perineal pain: Secondary | ICD-10-CM

## 2020-09-22 DIAGNOSIS — Z1211 Encounter for screening for malignant neoplasm of colon: Secondary | ICD-10-CM | POA: Diagnosis not present

## 2020-09-22 DIAGNOSIS — D123 Benign neoplasm of transverse colon: Secondary | ICD-10-CM

## 2020-09-22 MED ORDER — SODIUM CHLORIDE 0.9 % IV SOLN: 500.0000 mL | Freq: Once | INTRAVENOUS | Status: DC

## 2020-09-22 NOTE — Op Note (Signed)
[facility] Patient Name: Curtis Tyler Procedure Date: 09/22/2020 10:26 AM MRN: [id number] Endoscopist: Thornton Park MD, MD Age: 80 Referring MD:  Date of Birth: [birth year] Gender: Male Account #: [id number] Procedure:                Colonoscopy Indications:              Surveillance: Personal history of adenomatous                            polyps on last colonoscopy > 3 years ago                           History of colon polyps                           -Initially diagnosed on colonoscopy at age 62                           -Most recently had 3 tubular adenomas removed on                            colonoscopy with Dr. Penelope Coop 2018                           -Surveillance recommended in 3 years Medicines:                Monitored Anesthesia Care Procedure:                Pre-Anesthesia Assessment:                           - Prior to the procedure, a History and Physical                            was performed, and patient medications and                            allergies were reviewed. The patient's tolerance of                            previous anesthesia was also reviewed. The risks                            and benefits of the procedure and the sedation                            options and risks were discussed with the patient.                            All questions were answered, and informed consent                            was obtained. Prior Anticoagulants: The patient has  taken no previous anticoagulant or antiplatelet                            agents. ASA Grade Assessment: III - A patient with                            severe systemic disease. After reviewing the risks                            and benefits, the patient was deemed in                            satisfactory condition to undergo the procedure.                           After obtaining informed consent, the colonoscope                            was  passed under direct vision. Throughout the                            procedure, the patient's blood pressure, pulse, and                            oxygen saturations were monitored continuously. The                            Olympus CF-HQ190L (Serial# [id number]) Colonoscope was                            introduced through the anus and advanced to the 3                            cm into the ileum. The colonoscopy was performed                            without difficulty. The patient tolerated the                            procedure well. The quality of the bowel                            preparation was good. The ileocecal valve,                            appendiceal orifice, and rectum were photographed.                            However, [employer]ation (the endowriter) did not save                            the images. Captured photographs were then uploaded  into EPIC to be part of the permanent electronic                            health record. Scope In: 10:36:13 AM Scope Out: 10:58:11 AM Scope Withdrawal Time: 0 hours 16 minutes 55 seconds  Total Procedure Duration: 0 hours 21 minutes 58 seconds  Findings:                 Non-bleeding external and internal hemorrhoids were                            found.                           Multiple small and large-mouthed diverticula were                            found in the sigmoid colon and descending colon.                           Five sessile polyps were found in the descending                            colon (2), hepatic flexure (2) and ascending colon.                            The polyps were 1 to 4 mm in size. These polyps                            were removed with a cold snare. Resection and                            retrieval were complete. Estimated blood loss was                            minimal.                           The exam was otherwise without abnormality on                             direct and retroflexion views. Complications:            No immediate complications. Estimated blood loss:                            Minimal. Estimated Blood Loss:     Estimated blood loss was minimal. Impression:               - Non-bleeding external and internal hemorrhoids.                           - Diverticulosis in the sigmoid colon and in the                            descending colon.                           -  Five 1 to 4 mm polyps in the descending colon, at                            the hepatic flexure and in the ascending colon,                            removed with a cold snare. Resected and retrieved.                           - The examination was otherwise normal on direct                            and retroflexion views. Recommendation:           - Patient has a contact number available for                            emergencies. The signs and symptoms of potential                            delayed complications were discussed with the                            patient. Return to normal activities tomorrow.                            Written discharge instructions were provided to the                            patient.                           - High fiber diet.                           - Continue present medications.                           - Await pathology results.                           - Repeat colonoscopy is not recommended due to                            current age.                           - Emerging evidence supports eating a diet of                            fruits, vegetables, grains, calcium, and yogurt                            while reducing red meat and alcohol may reduce the  risk of colon cancer.                           - Thank you for allowing me to be involved in your                            colon cancer prevention. Thornton Park MD, MD 09/22/2020 11:05:04 AM This report has been signed  electronically.

## 2020-09-22 NOTE — Progress Notes (Signed)
To PACU, VSS. Report to [email or web address] 

## 2020-09-22 NOTE — Patient Instructions (Signed)
Handouts Provided:  Polyps and Diverticulosis ° °YOU HAD AN ENDOSCOPIC PROCEDURE TODAY AT THE [facility]:   Refer to the procedure report that was given to you for any specific questions about what was found during the examination.  If the procedure report does not answer your questions, please call your gastroenterologist to clarify.  If you requested that your care partner not be given the details of your procedure findings, then the procedure report has been included in a sealed envelope for you to review at your convenience later. ° °YOU SHOULD EXPECT: Some feelings of bloating in the abdomen. Passage of more gas than usual.  Walking can help get rid of the air that was put into your GI tract during the procedure and reduce the bloating. If you had a lower endoscopy (such as a colonoscopy or flexible sigmoidoscopy) you may notice spotting of blood in your stool or on the toilet paper. If you underwent a bowel prep for your procedure, you may not have a normal bowel movement for a few days. ° °Please Note:  You might notice some irritation and congestion in your nose or some drainage.  This is from the oxygen used during your procedure.  There is no need for concern and it should clear up in a day or so. ° °SYMPTOMS TO REPORT IMMEDIATELY: ° °Following lower endoscopy (colonoscopy or flexible sigmoidoscopy): ° Excessive amounts of blood in the stool ° Significant tenderness or worsening of abdominal pains ° Swelling of the abdomen that is new, acute ° Fever of 100°F or higher ° °For urgent or emergent issues, a gastroenterologist can be reached at any hour by calling [phone]. °Do not use MyChart messaging for urgent concerns.  ° ° °DIET:  We do recommend a small meal at first, but then you may proceed to your regular diet.  Drink plenty of fluids but you should avoid alcoholic beverages for 24 hours. ° °ACTIVITY:  You should plan to take it easy for the rest of today and you should NOT DRIVE  or use heavy machinery until tomorrow (because of the sedation medicines used during the test).   ° °FOLLOW UP: °Our staff will call the number listed on your records 48-72 hours following your procedure to check on you and address any questions or concerns that you may have regarding the information given to you following your procedure. If we do not reach you, we will leave a message.  We will attempt to reach you two times.  During this call, we will ask if you have developed any symptoms of COVID 19. If you develop any symptoms (ie: fever, flu-like symptoms, shortness of breath, cough etc.) before then, please call [phone].  If you test positive for Covid 19 in the 2 weeks post procedure, please call and report this information to us.   ° °If any biopsies were taken you will be contacted by phone or by letter within the next 1-3 weeks.  Please call us at [phone] if you have not heard about the biopsies in 3 weeks.  ° ° °SIGNATURES/CONFIDENTIALITY: °You and/or your care partner have signed paperwork which will be entered into your electronic medical record.  These signatures attest to the fact that that the information above on your After Visit Summary has been reviewed and is understood.  Full responsibility of the confidentiality of this discharge information lies with you and/or your care-partner. ° °

## 2020-09-22 NOTE — Progress Notes (Signed)
Called to room to assist during endoscopic procedure.  Patient ID and intended procedure confirmed with present staff. Received instructions for my participation in the procedure from the performing physician.  

## 2020-09-24 ENCOUNTER — Telehealth: Payer: Self-pay

## 2020-09-24 ENCOUNTER — Telehealth: Payer: Self-pay | Admitting: *Deleted

## 2020-09-24 NOTE — Telephone Encounter (Signed)
Left message on follow up call. 

## 2020-09-24 NOTE — Telephone Encounter (Signed)
  Follow up Call-  Call back number 09/22/2020 09/04/2019  Post procedure Call Back phone  # [phone]  Permission to leave phone message Yes Yes  Some recent data might be hidden     Patient questions:  Do you have a fever, pain , or abdominal swelling? No. Pain Score  0 *  Have you tolerated food without any problems? Yes.    Have you been able to return to your normal activities? Yes.    Do you have any questions about your discharge instructions: Diet   No. Medications  No. Follow up visit  No.  Do you have questions or concerns about your Care? No.  Actions: * If pain score is 4 or above: 1. No action needed, pain <4.Have you developed a fever since your procedure? no  2.   Have you had an respiratory symptoms (SOB or cough) since your procedure? no  3.   Have you tested positive for COVID 19 since your procedure no  4.   Have you had any family members/close contacts diagnosed with the COVID 19 since your procedure?  no   If yes to any of these questions please route to Joylene John, RN and Joella Prince, RN

## 2020-09-26 ENCOUNTER — Other Ambulatory Visit: Payer: Self-pay | Admitting: Family Medicine

## 2020-09-29 ENCOUNTER — Encounter: Payer: Self-pay | Admitting: Family Medicine

## 2020-09-29 MED ORDER — GABAPENTIN 300 MG PO CAPS: 300.0000 mg | ORAL_CAPSULE | Freq: Two times a day (BID) | ORAL | 1 refills | Status: DC

## 2020-10-01 ENCOUNTER — Encounter: Payer: Self-pay | Admitting: Gastroenterology

## 2020-10-01 ENCOUNTER — Telehealth: Payer: Self-pay | Admitting: Gastroenterology

## 2020-10-01 NOTE — Telephone Encounter (Signed)
Left message for pt to that path report is not back yet and that once it is reviewed by Dr. Tarri Glenn he will be notified.

## 2020-10-01 NOTE — Telephone Encounter (Signed)
Pt called inquiring about path results. °

## 2020-10-02 ENCOUNTER — Encounter: Payer: Self-pay | Admitting: Family Medicine

## 2020-10-09 ENCOUNTER — Ambulatory Visit (INDEPENDENT_AMBULATORY_CARE_PROVIDER_SITE_OTHER): Payer: PPO

## 2020-10-09 ENCOUNTER — Ambulatory Visit (INDEPENDENT_AMBULATORY_CARE_PROVIDER_SITE_OTHER): Payer: PPO | Admitting: Podiatry

## 2020-10-09 ENCOUNTER — Other Ambulatory Visit: Payer: Self-pay

## 2020-10-09 DIAGNOSIS — M19079 Primary osteoarthritis, unspecified ankle and foot: Secondary | ICD-10-CM

## 2020-10-09 DIAGNOSIS — M779 Enthesopathy, unspecified: Secondary | ICD-10-CM

## 2020-10-09 DIAGNOSIS — M79672 Pain in left foot: Secondary | ICD-10-CM

## 2020-10-09 DIAGNOSIS — M19072 Primary osteoarthritis, left ankle and foot: Secondary | ICD-10-CM | POA: Diagnosis not present

## 2020-10-09 DIAGNOSIS — M79671 Pain in right foot: Secondary | ICD-10-CM | POA: Diagnosis not present

## 2020-10-09 MED ORDER — DEXAMETHASONE SODIUM PHOSPHATE 120 MG/30ML IJ SOLN
2.0000 mg | Freq: Once | INTRAMUSCULAR | Status: AC
  Administered 2020-10-26: 2 mg via INTRA_ARTICULAR

## 2020-10-09 NOTE — Progress Notes (Signed)
  Subjective:  Patient ID: Curtis Tyler, male    DOB: [birth year],  MRN: [id number]  Chief Complaint  Patient presents with  . Foot Pain    Foot pain. Top of left foot.   . Callouses    Right foot calluse  . Bunions    Bunion of right foot.    80 y.o. male presents with the above complaint. History confirmed with patient.   Objective:  Physical Exam: warm, good capillary refill, no trophic changes or ulcerative lesions, normal DP and PT pulses and normal sensory exam. Left Foot: Pain to palpation about the dorsal left lateral tarsometatarsal joints Right Foot: Hallux valgus right without pain to palpation  No images are attached to the encounter.  Radiographs: X-ray of both feet: no fracture, dislocation, swelling or degenerative changes noted Assessment:   1. Foot pain, bilateral   2. Arthritis, midfoot    Plan:  Patient was evaluated and treated and all questions answered.  Capsulitis -Educated on etiology -XR reviewed with patient -Discussed padding and proper shoegear -Offloading pad applied  -Injection delivered to the painful joint  Procedure: Joint Injection Location: left dorsal 4th TMT joint Skin Prep: Alcohol. Injectate: 0.5 cc 1% lidocaine plain, 0.5 cc celestone Disposition: Patient tolerated procedure well. Injection site dressed with a band-aid.   Return in about 6 weeks (around 11/20/2020) for Capsulitis and Arthritis.

## 2020-10-12 DIAGNOSIS — R3912 Poor urinary stream: Secondary | ICD-10-CM | POA: Diagnosis not present

## 2020-10-12 DIAGNOSIS — N401 Enlarged prostate with lower urinary tract symptoms: Secondary | ICD-10-CM | POA: Diagnosis not present

## 2020-10-12 DIAGNOSIS — R350 Frequency of micturition: Secondary | ICD-10-CM | POA: Diagnosis not present

## 2020-10-12 DIAGNOSIS — R3915 Urgency of urination: Secondary | ICD-10-CM | POA: Diagnosis not present

## 2020-10-14 ENCOUNTER — Other Ambulatory Visit: Payer: Self-pay | Admitting: Podiatry

## 2020-10-14 DIAGNOSIS — M779 Enthesopathy, unspecified: Secondary | ICD-10-CM

## 2020-10-26 DIAGNOSIS — M79672 Pain in left foot: Secondary | ICD-10-CM | POA: Diagnosis not present

## 2020-10-26 DIAGNOSIS — M19072 Primary osteoarthritis, left ankle and foot: Secondary | ICD-10-CM | POA: Diagnosis not present

## 2020-10-26 DIAGNOSIS — M79671 Pain in right foot: Secondary | ICD-10-CM | POA: Diagnosis not present

## 2020-11-20 ENCOUNTER — Ambulatory Visit: Payer: PPO | Admitting: Podiatry

## 2020-11-23 ENCOUNTER — Encounter: Payer: Self-pay | Admitting: Family Medicine

## 2020-11-23 DIAGNOSIS — E785 Hyperlipidemia, unspecified: Secondary | ICD-10-CM

## 2020-11-23 DIAGNOSIS — R7303 Prediabetes: Secondary | ICD-10-CM

## 2020-11-23 DIAGNOSIS — I10 Essential (primary) hypertension: Secondary | ICD-10-CM

## 2020-11-25 ENCOUNTER — Encounter: Payer: Self-pay | Admitting: Family Medicine

## 2020-11-26 ENCOUNTER — Other Ambulatory Visit (INDEPENDENT_AMBULATORY_CARE_PROVIDER_SITE_OTHER): Payer: PPO

## 2020-11-26 ENCOUNTER — Encounter: Payer: Self-pay | Admitting: Family Medicine

## 2020-11-26 ENCOUNTER — Other Ambulatory Visit: Payer: Self-pay

## 2020-11-26 DIAGNOSIS — R7303 Prediabetes: Secondary | ICD-10-CM | POA: Diagnosis not present

## 2020-11-26 DIAGNOSIS — I10 Essential (primary) hypertension: Secondary | ICD-10-CM

## 2020-11-26 DIAGNOSIS — E785 Hyperlipidemia, unspecified: Secondary | ICD-10-CM

## 2020-11-26 LAB — LIPID PANEL
Cholesterol: 178 mg/dL (ref 0–200)
HDL: 61.4 mg/dL (ref >39.00)
LDL Cholesterol: 95 mg/dL (ref 0–99)
NonHDL: 116.43
Total CHOL/HDL Ratio: 3
Triglycerides: 105 mg/dL (ref 0.0–149.0)
VLDL: 21 mg/dL (ref 0.0–40.0)

## 2020-11-26 LAB — BASIC METABOLIC PANEL
BUN: 18 mg/dL (ref 6–23)
CO2: 32 mEq/L (ref 19–32)
Calcium: 9.7 mg/dL (ref 8.4–10.5)
Chloride: 98 mEq/L (ref 96–112)
Creatinine, Ser: 1.09 mg/dL (ref 0.40–1.50)
GFR: 63.36 mL/min (ref >60.00)
Glucose, Bld: 130 mg/dL — ABNORMAL HIGH (ref 70–99)
Potassium: 4.3 mEq/L (ref 3.5–5.1)
Sodium: 137 mEq/L (ref 135–145)

## 2020-11-26 LAB — CBC
HCT: 49.9 % (ref 39.0–52.0)
Hemoglobin: 16.8 g/dL (ref 13.0–17.0)
MCHC: 33.6 g/dL (ref 30.0–36.0)
MCV: 93.2 fl (ref 78.0–100.0)
Platelets: 167 10*3/uL (ref 150.0–400.0)
RBC: 5.36 Mil/uL (ref 4.22–5.81)
RDW: 13.1 % (ref 11.5–15.5)
WBC: 5.6 10*3/uL (ref 4.0–10.5)

## 2020-11-26 LAB — HEMOGLOBIN A1C: Hgb A1c MFr Bld: 6.2 % (ref 4.6–6.5)

## 2020-11-27 NOTE — Patient Instructions (Addendum)
It was great to see you again today! We can consider doing a follow-up chest CT per your preference in about one year, let me know what you prefer to do Your BP is a bit elevated today- please keep an eye on it at home.  If your BP continues to run higher than 140/85 please let me or Dr Percival Spanish know and we can adjust your medication  We can plan to visit in about 6 months Please get your 4th covid dose after your tip to [place]

## 2020-11-27 NOTE — Progress Notes (Signed)
[place] at [facility] [place], [place], [place] [address] [phone]  Date:  11/30/2020   Name:  Curtis Tyler   DOB:  1940/10/24   MRN:  [id number]  PCP:  [name], MD    Chief Complaint: Hypertension   History of Present Illness:  Curtis Tyler is a 80 y.o. very pleasant male patient who presents with the following:  Patient here today for 65-month follow-up visit-  History of prediabetes, CAD, hyperlipidemia, hypertension He also had lab work done last week listed below Last seen by myself in August He is taking gabapentin 600 at bedtime - this was originally started for pain in his legs which was worse at night  He also notes that it helps him to sleep  He wonders about stopping his medication today and we discussed potentially stopping it.  However, he has not noticed any ill effects and it does help him sleep.  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  He stopped smoking 42 years ago I got a reminder message recently follow-up scan-however, as above I think it is actually a little bit too soon to do follow-up right now.  We discussed potentially doing a follow-up CT scan.  For the time being he thinks he will decline to follow-up which I think is reasonable.  He will let me know if he changes his mind  He was last seen by his urologist, Dr. Diona Fanti in [place] had UroLift procedure which is giving him some benefit with nighttime  urinary frequency He is no longer taking cialis He saw his gastroenterologist, Dr. Tarri Glenn earlier this year  He has previously reported completion of his pneumonia series Tetanus booster can be given at drugstore COVID-19 booster- had the third dose last summer  He has not had covid infection- we discussed and recommended a 4th dose at his convenience  Shingrix is complete  BP Readings from Last 3 Encounters:  11/30/20 (!) 150/70  09/22/20 (!) 143/75  05/20/20 (!) 142/64     Results for orders placed or performed in visit on 11/26/20  Hemoglobin A1c  Result Value Ref Range   Hgb A1c MFr Bld 6.2 4.6 - 6.5 %  Lipid panel  Result Value Ref Range   Cholesterol 178 0 - 200 mg/dL   Triglycerides 105.0 0.0 - 149.0 mg/dL   HDL 61.40 >39.00 mg/dL   VLDL 21.0 0.0 - 40.0 mg/dL   LDL Cholesterol 95 0 - 99 mg/dL   Total CHOL/HDL Ratio 3    NonHDL 093.81   Basic metabolic panel  Result Value Ref Range   Sodium 137 135 - 145 mEq/L   Potassium 4.3 3.5 - 5.1 mEq/L   Chloride 98 96 - 112 mEq/L   CO2 32 19 - 32 mEq/L   Glucose, Bld 130 (H) 70 - 99 mg/dL   BUN 18 6 - 23 mg/dL  Creatinine, Ser 1.09 0.40 - 1.50 mg/dL   GFR 63.36 >60.00 mL/min   Calcium 9.7 8.4 - 10.5 mg/dL  CBC  Result Value Ref Range   WBC 5.6 4.0 - 10.5 K/uL   RBC 5.36 4.22 - 5.81 Mil/uL   Platelets 167.0 150.0 - 400.0 K/uL   Hemoglobin 16.8 13.0 - 17.0 g/dL   HCT 49.9 39.0 - 52.0 %   MCV 93.2 78.0 - 100.0 fl   MCHC 33.6 30.0 - 36.0 g/dL   RDW 13.1 11.5 - 15.5 %    Patient Active Problem List   Diagnosis Date Noted  . Educated about COVID-19 virus infection 05/11/2020  . Lung nodule 04/10/2020  . Elevated bilirubin 05/21/2019  . Pre-diabetes 05/20/2019  . Dyslipidemia 05/03/2019  . Bradycardia 09/12/2018  . Plantar porokeratosis, acquired 05/16/2018  . CAD (coronary artery disease) 02/25/2016  . Abnormal stress ECG with treadmill 02/12/2016  . Colonic polyp 09/21/2015  . Left lower quadrant pain  09/03/2015  . Nasal congestion 09/03/2015  . Cellulitis of right lower extremity 05/14/2015  . Need for immunization against influenza 05/14/2015  . Bilateral low back pain with sciatica 09/03/2014  . Hip pain, bilateral 09/03/2014  . Bilateral carotid artery stenosis 07/21/2014  . Myalgia 07/21/2014  . Acute pain of left knee 07/15/2014  . Benign prostatic hyperplasia with lower urinary tract symptoms 07/15/2014  . Effusion of left knee 07/15/2014  . History of elevated prostate specific antigen (PSA) 07/15/2014  . Hypertrophy of nasal turbinates 07/15/2014  . 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 Incomplete emptying of bladder 07/15/2014  . Increased urinary frequency 07/15/2014  . 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 Chest pain 09/05/2012  . Cough 08/06/2011  . Coronary artery disease involving native coronary artery without angina pectoris 04/26/2011  . Essential hypertension 04/26/2011  . Hyperlipidemia with target LDL less than 70 04/26/2011  . Cerebrovascular disease 04/26/2011    Past Medical History:  Diagnosis Date  . Arthritis   . BPH (benign prostatic hyperplasia)   . Bunion   . Callus   . Carotid artery occlusion   . Coronary artery disease    left main 60% stenosis. The LAD had a proximal 60-70% stenosis. There was mid 40% stenosis. Diagonal is moderate size with 40% stenosis. The circumflex system included a large OM with 60% ostial stenosis the RCA was occluded before the PDA. The EF was 65%.  2014  . Glaucoma    beginning of glaucoma- uses drops   . Hyperlipidemia   . Hypertension   . Myocardial infarct ([place]) 1992   inferior posterior- discovered in 1992-? when happened   . Obesity   . Radiculopathy   . Shortness of breath dyspnea    with exertion -     Past Surgical History:  Procedure Laterality Date  . APPENDECTOMY  1958  . CARDIAC CATHETERIZATION  05/2004   SHOWING 100% OCCLUDED DISTAL RIGHT CORONARY WITH  LEFT  TO RIGHT COLLATERALS, AS WELL  AS ANTEGRADE FLOW, NORMAL LEFT MAIN, 50% NARROWING IN LEFT CIRCUMFLEX  WITH IRREGULARITIES IN THE LAD  . CARDIAC CATHETERIZATION N/A 02/12/2016   Procedure: Left Heart Cath and Coronary Angiography;  Surgeon: Leonie Man, MD;  Location: [place];  Service: Cardiovascular;  Laterality: N/A;  . CARDIOVASCULAR STRESS TEST  02/26/2010   EF 65%, SMALL AREA OF MILD INFARCT AND POSSIBLE PER-INFARCT ISCHEMIA IN THE INFEROBASAL WALL  . COLONOSCOPY    . CORONARY ANGIOPLASTY     RIGHT CORONARY WERE UNSUCCESSFUL  .  CORONARY ARTERY BYPASS GRAFT N/A 02/25/2016   Procedure: CORONARY ARTERY BYPASS GRAFTING (CABG) times four using the left internal mammary artery and the right saphenus vein ;  Surgeon: [name], MD;  Location: [place] [facility];  Service: Open Heart Surgery;  Laterality: N/A;  . HERNIA REPAIR  1960   right hernia repair  . INTRAOPERATIVE TRANSESOPHAGEAL ECHOCARDIOGRAM N/A 02/25/2016   Procedure: INTRAOPERATIVE TRANSESOPHAGEAL ECHOCARDIOGRAM;  Surgeon: [name], MD;  Location: [place];  Service: Open Heart Surgery;  Laterality: N/A;  . UMBILICAL HERNIA REPAIR    . US ECHOCARDIOGRAPHY  01/03/2007   EF 55-60%    Social History   Tobacco Use  . Smoking status: Former Smoker    Packs/day: 1.00    Years: 25.00    Pack years: 25.00    Types: Cigarettes    Quit date: 07/23/1981    Years since quitting: 39.3  . Smokeless tobacco: Never Used  Vaping Use  . Vaping Use: Never used  Substance Use Topics  . Alcohol use: Yes    Alcohol/week: 8.0 standard drinks    Types: 7 Glasses of wine, 1 Cans of beer per week    Comment: glass of wine a night   . Drug use: No    Family History  Problem Relation Age of Onset  . Stroke Father   . Congestive Heart Failure Mother   . Diabetes Mother   . Hypertension Neg Hx   . Colon cancer Neg Hx   . Colon polyps Neg Hx   . Esophageal cancer Neg Hx   . Rectal cancer Neg Hx   . Stomach cancer Neg Hx     No Known  Allergies  Medication list has been reviewed and updated.  Current Outpatient Medications on File Prior to Visit  Medication Sig Dispense Refill  . amLODipine (NORVASC) 5 MG tablet TAKE ONE TABLET BY MOUTH DAILY 90 tablet 3  . aspirin 81 MG tablet Take 81 mg by mouth daily.    . Coenzyme Q10 100 MG TABS Take 100 mg by mouth daily.     Marland Kitchen dicyclomine (BENTYL) 10 MG capsule Take 1 pill by mouth every am. May take additional 3 times daily prn 50 capsule 3  . ezetimibe (ZETIA) 10 MG tablet Take 1 tablet (10 mg total) by mouth daily. 90 tablet 3  . fish oil-omega-3 fatty acids 1000 MG capsule Take 1,400 mg by mouth daily.    Marland Kitchen gabapentin (NEURONTIN) 300 MG capsule Take 1 capsule (300 mg total) by mouth 2 (two) times daily. 180 capsule 1  . Glucosamine-Chondroitin-MSM 500-200-150 MG TABS Take by mouth.    . latanoprost (XALATAN) 0.005 % ophthalmic solution Place 1 drop into both eyes daily.    Marland Kitchen losartan (COZAAR) 100 MG tablet Take 1 tablet (100 mg total) by mouth daily. 90 tablet 3  . Multiple Vitamins-Minerals (MULTIVITAMIN WITH MINERALS) tablet Take 1 tablet by mouth daily.    . Plant Sterols and Stanols 450 MG TABS Take 900 mg by mouth daily.    . rosuvastatin (CRESTOR) 10 MG tablet Take 1 tablet (10 mg total) by mouth every other day. 45 tablet 3   No current facility-administered medications on file prior to visit.    Review of Systems:  As per HPI- otherwise negative.   Physical Examination: Vitals:   11/30/20 0919 11/30/20 0942  BP: (!) 154/74 (!) 150/70  Pulse: 85   Resp: 17   Temp: 97.7 F (36.5 C)   SpO2: 95%  Vitals:   11/30/20 0919  Weight: 179 lb (81.2 kg)  Height: 5\' 2"  (1.575 m)   Body mass index is 32.74 kg/m. Ideal Body Weight: Weight in (lb) to have BMI = 25: 136.4  GEN: no acute distress.  Obese, looks well  HEENT: Atraumatic, Normocephalic.  Ears and Nose: No external deformity. CV: RRR, No M/G/R. No JVD. No thrill. No extra heart sounds. PULM: CTA  B, no wheezes, crackles, rhonchi. No retractions. No resp. distress. No accessory muscle use. ABD: S, NT, ND, +BS. No rebound. No HSM. EXTR: No c/c/e PSYCH: Normally interactive. Conversant.    Assessment and Plan: Essential hypertension  Dyslipidemia  Pre-diabetes  Gilbert syndrome  Solitary pulmonary nodule  Here today for follow-up visit.  His blood pressure is slightly elevated.  He plans to follow-up and check blood pressures at home, he will let me know if blood pressure continues to be higher than 140/85 Recent follow-up with his cholesterol and A1c shows good control-continue current medication for cholesterol He is not on any blood sugar lowering medications at this time  We discussed pulmonary nodule.  Given that he quit smoking greater than 40 years ago I would not consider him to be at highly increased cancer risk.  He will think about potentially doing a follow-up scan  Recommended fourth dose of COVID-19 vaccine Plan to visit in about 6 months assuming all is well This visit occurred during the SARS-CoV-2 public health emergency.  Safety protocols were in place, including screening questions prior to the visit, additional usage of staff PPE, and extensive cleaning of exam room while observing appropriate contact time as indicated for disinfecting solutions.    Signed Lamar Blinks, MD

## 2020-11-30 ENCOUNTER — Encounter: Payer: Self-pay | Admitting: Family Medicine

## 2020-11-30 ENCOUNTER — Other Ambulatory Visit: Payer: Self-pay

## 2020-11-30 ENCOUNTER — Ambulatory Visit (INDEPENDENT_AMBULATORY_CARE_PROVIDER_SITE_OTHER): Payer: PPO | Admitting: Family Medicine

## 2020-11-30 VITALS — BP 150/70 | HR 85 | Temp 97.7°F | Resp 17 | Ht 62.0 in | Wt 179.0 lb

## 2020-11-30 DIAGNOSIS — E785 Hyperlipidemia, unspecified: Secondary | ICD-10-CM | POA: Diagnosis not present

## 2020-11-30 DIAGNOSIS — R911 Solitary pulmonary nodule: Secondary | ICD-10-CM

## 2020-11-30 DIAGNOSIS — R7303 Prediabetes: Secondary | ICD-10-CM

## 2020-11-30 DIAGNOSIS — I10 Essential (primary) hypertension: Secondary | ICD-10-CM | POA: Diagnosis not present

## 2021-01-08 DIAGNOSIS — R350 Frequency of micturition: Secondary | ICD-10-CM | POA: Diagnosis not present

## 2021-01-08 DIAGNOSIS — N400 Enlarged prostate without lower urinary tract symptoms: Secondary | ICD-10-CM | POA: Diagnosis not present

## 2021-01-08 DIAGNOSIS — N401 Enlarged prostate with lower urinary tract symptoms: Secondary | ICD-10-CM | POA: Diagnosis not present

## 2021-01-11 ENCOUNTER — Ambulatory Visit: Payer: PPO

## 2021-01-11 DIAGNOSIS — H401131 Primary open-angle glaucoma, bilateral, mild stage: Secondary | ICD-10-CM | POA: Diagnosis not present

## 2021-01-11 NOTE — Progress Notes (Signed)
   Covid-19 Vaccination Clinic  Name:  Curtis Tyler    MRN: [id number] DOB: 1941-02-12  01/11/2021  [name] was observed post Covid-19 immunization for 15 minutes without incident. He was provided with Vaccine Information Sheet and instruction to access the V-Safe system.   [name] was instructed to call 911 with any severe reactions post vaccine: Marland Kitchen Difficulty breathing  . Swelling of face and throat  . A fast heartbeat  . A bad rash all over body  . Dizziness and weakness

## 2021-01-12 ENCOUNTER — Other Ambulatory Visit (HOSPITAL_BASED_OUTPATIENT_CLINIC_OR_DEPARTMENT_OTHER): Payer: Self-pay

## 2021-01-12 MED ORDER — PFIZER-BIONT COVID-19 VAC-TRIS 30 MCG/0.3ML IM SUSP
INTRAMUSCULAR | 0 refills | Status: DC
  Filled 2021-01-12: qty 0.3, 1d supply, fill #0

## 2021-01-19 DIAGNOSIS — M21621 Bunionette of right foot: Secondary | ICD-10-CM | POA: Diagnosis not present

## 2021-03-26 DIAGNOSIS — Z20822 Contact with and (suspected) exposure to covid-19: Secondary | ICD-10-CM | POA: Diagnosis not present

## 2021-03-27 ENCOUNTER — Encounter: Payer: Self-pay | Admitting: Family Medicine

## 2021-03-29 ENCOUNTER — Other Ambulatory Visit: Payer: Self-pay

## 2021-03-29 ENCOUNTER — Telehealth (INDEPENDENT_AMBULATORY_CARE_PROVIDER_SITE_OTHER): Payer: PPO | Admitting: Family Medicine

## 2021-03-29 DIAGNOSIS — U071 COVID-19: Secondary | ICD-10-CM

## 2021-03-29 NOTE — Telephone Encounter (Signed)
Called and confirmed that he is aware of appt time

## 2021-03-29 NOTE — Progress Notes (Signed)
[place] at [facility] [address], [place], [place] [address] [phone]  Date:  03/29/2021   Name:  Curtis Tyler   DOB:  September 13, 1940   MRN:  [id number]  PCP:  Curtis Mclean, MD    Chief Complaint: Covid Positive (/)   History of Present Illness:  Curtis Tyler is a 80 y.o. very pleasant male patient who presents with the following:  Virtual visit to discuss covid 84- he got sick Monday one week ago. He had a ST. Negative covid at home. He went to the drugstore and got some OTC medication.  He felt ok the next couple of days.  Thursday 7/28 he felt poorly and re-tested- still negative This past Friday he tested, got the positive result on Monday  He notes that his fever seemed to resolve and overall he is feeling much better No SOB He is still getting tired a bit more easily than normal   He is immunized as well as boosted      Patient Active Problem List   Diagnosis Date Noted   Educated about COVID-19 virus infection 05/11/2020   Lung nodule 04/10/2020   Elevated bilirubin 05/21/2019   Pre-diabetes 05/20/2019   Dyslipidemia 05/03/2019   Bradycardia 09/12/2018   Plantar porokeratosis, acquired 05/16/2018   CAD (coronary artery disease) 02/25/2016   Abnormal stress ECG with treadmill 02/12/2016   Colonic polyp 09/21/2015   Left lower quadrant pain 09/03/2015   Nasal congestion 09/03/2015   Cellulitis of right lower extremity 05/14/2015   Need for immunization against influenza 05/14/2015   Bilateral low back pain with sciatica 09/03/2014   Hip pain, bilateral 09/03/2014   Bilateral carotid artery stenosis 07/21/2014   Myalgia 07/21/2014   Acute pain of left knee 07/15/2014   Benign prostatic hyperplasia with lower urinary tract symptoms 07/15/2014   Effusion of left knee 07/15/2014   History of elevated prostate specific antigen (PSA) 07/15/2014   Hypertrophy of nasal turbinates 07/15/2014   Hypogonadism  male 07/15/2014   Incomplete emptying of bladder 07/15/2014   Increased urinary frequency 07/15/2014   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 The LAD had a proximal 60-70% stenosis. There was mid 40% stenosis. Diagonal is moderate size with 40% stenosis. The circumflex system included a large OM with 60% ostial stenosis the RCA was occluded before the PDA. The EF was 65%.  2014   Glaucoma    beginning of glaucoma- uses drops    Hyperlipidemia    Hypertension    Myocardial infarct ([place]) 1992   inferior posterior- discovered in 1992-? when happened    Obesity    Radiculopathy    Shortness of breath dyspnea    with exertion -     Past Surgical History:  Procedure Laterality Date   [place]  05/2004   SHOWING 100% OCCLUDED DISTAL RIGHT CORONARY WITH  LEFT  TO RIGHT COLLATERALS, AS WELL AS ANTEGRADE FLOW,  NORMAL LEFT MAIN, 50% NARROWING IN LEFT CIRCUMFLEX  WITH IRREGULARITIES IN THE LAD   CARDIAC CATHETERIZATION N/A 02/12/2016   Procedure: Left Heart Cath and Coronary Angiography;  Surgeon: Leonie Man, MD;  Location: [place] [facility];  Service: Cardiovascular;  Laterality: N/A;   CARDIOVASCULAR STRESS TEST  02/26/2010   EF 65%, SMALL AREA OF MILD INFARCT AND POSSIBLE PER-INFARCT ISCHEMIA IN THE INFEROBASAL WALL   COLONOSCOPY     CORONARY ANGIOPLASTY     RIGHT CORONARY WERE UNSUCCESSFUL   CORONARY ARTERY BYPASS GRAFT N/A 02/25/2016   Procedure: CORONARY ARTERY BYPASS GRAFTING  (CABG) times four using the left internal mammary artery and the right saphenus vein ;  Surgeon: [name], MD;  Location: [place];  Service: Open Heart Surgery;  Laterality: N/A;   HERNIA REPAIR  1960   right hernia repair   INTRAOPERATIVE TRANSESOPHAGEAL ECHOCARDIOGRAM N/A 02/25/2016   Procedure: INTRAOPERATIVE TRANSESOPHAGEAL ECHOCARDIOGRAM;  Surgeon: [name], MD;  Location: [place];  Service: Open Heart Surgery;  Laterality: N/A;   UMBILICAL HERNIA REPAIR     US ECHOCARDIOGRAPHY  01/03/2007   EF 55-60%    Social History   Tobacco Use   Smoking status: Former    Packs/day: 1.00    Years: 25.00    Pack years: 25.00    Types: Cigarettes    Quit date: 07/23/1981    Years since quitting: 39.7   Smokeless tobacco: Never  Vaping Use   Vaping Use: Never used  Substance Use Topics   Alcohol use: Yes    Alcohol/week: 8.0 standard drinks    Types: 7 Glasses of wine, 1 Cans of beer per week    Comment: glass of wine a night    Drug use: No    Family History  Problem Relation Age of Onset   Stroke Father    Congestive Heart Failure Mother    Diabetes Mother    Hypertension Neg Hx    Colon cancer Neg Hx    Colon polyps Neg Hx    Esophageal cancer Neg Hx    Rectal cancer Neg Hx    Stomach cancer Neg Hx     No Known Allergies  Medication list has been reviewed and updated.  Current Outpatient Medications on File Prior to Visit  Medication Sig Dispense Refill   amLODipine (NORVASC) 5 MG tablet TAKE ONE TABLET BY MOUTH DAILY 90 tablet 3   aspirin 81 MG tablet Take 81 mg by mouth daily.     Coenzyme Q10 100 MG TABS Take 100 mg by mouth daily.      dicyclomine (BENTYL) 10 MG capsule Take 1 pill by mouth every am. May take additional 3 times daily prn 50 capsule 3   ezetimibe (ZETIA) 10 MG tablet Take 1 tablet (10 mg total) by mouth daily. 90 tablet 3   fish oil-omega-3 fatty acids 1000 MG capsule Take 1,400 mg by mouth daily.     gabapentin (NEURONTIN) 300 MG  capsule Take 1 capsule (300 mg total) by mouth 2 (two) times daily. 180 capsule 1   Glucosamine-Chondroitin-MSM 500-200-150 MG TABS Take by mouth.     latanoprost (XALATAN) 0.005 % ophthalmic solution Place 1 drop into both eyes daily.     losartan (COZAAR) 100 MG tablet Take 1 tablet (100 mg total) by mouth daily. 90 tablet 3   Multiple Vitamins-Minerals (MULTIVITAMIN WITH MINERALS) tablet Take 1 tablet by mouth daily.     Plant Sterols and Stanols 450 MG  TABS Take 900 mg by mouth daily.     rosuvastatin (CRESTOR) 10 MG tablet Take 1 tablet (10 mg total) by mouth every other day. 45 tablet 3   No current facility-administered medications on file prior to visit.    Review of Systems:  As per HPI- otherwise negative.   Physical Examination: Vitals:   Vitals:   There is no height or weight on file to calculate BMI. Ideal Body Weight:    Pt observed via mychart video  He looks well -no shortness of breath or distress is noted He checked his O2 sat- 96%, pulse 76  Assessment and Plan: COVID-19  Virtual visit with covid 19 Pt observed via video monitor today  He is on plus days out from start of symptoms, he seems to be improving.  At this time antivirals are likely not helpful.  He will let me know if any worsening of symptoms, otherwise we will he will continue to do well.  We went over quarantine protocol Signed Lamar Blinks, MD

## 2021-04-06 ENCOUNTER — Other Ambulatory Visit: Payer: Self-pay | Admitting: Family Medicine

## 2021-04-06 DIAGNOSIS — E785 Hyperlipidemia, unspecified: Secondary | ICD-10-CM

## 2021-04-11 ENCOUNTER — Other Ambulatory Visit: Payer: Self-pay | Admitting: Family Medicine

## 2021-04-11 DIAGNOSIS — I10 Essential (primary) hypertension: Secondary | ICD-10-CM

## 2021-05-05 ENCOUNTER — Other Ambulatory Visit: Payer: Self-pay | Admitting: Family Medicine

## 2021-05-05 DIAGNOSIS — H0288A Meibomian gland dysfunction right eye, upper and lower eyelids: Secondary | ICD-10-CM | POA: Diagnosis not present

## 2021-05-05 DIAGNOSIS — H2513 Age-related nuclear cataract, bilateral: Secondary | ICD-10-CM | POA: Diagnosis not present

## 2021-05-05 DIAGNOSIS — H0288B Meibomian gland dysfunction left eye, upper and lower eyelids: Secondary | ICD-10-CM | POA: Diagnosis not present

## 2021-05-05 DIAGNOSIS — H5203 Hypermetropia, bilateral: Secondary | ICD-10-CM | POA: Diagnosis not present

## 2021-05-05 DIAGNOSIS — H524 Presbyopia: Secondary | ICD-10-CM | POA: Diagnosis not present

## 2021-05-05 DIAGNOSIS — H401131 Primary open-angle glaucoma, bilateral, mild stage: Secondary | ICD-10-CM | POA: Diagnosis not present

## 2021-05-05 DIAGNOSIS — H25013 Cortical age-related cataract, bilateral: Secondary | ICD-10-CM | POA: Diagnosis not present

## 2021-05-05 DIAGNOSIS — H25043 Posterior subcapsular polar age-related cataract, bilateral: Secondary | ICD-10-CM | POA: Diagnosis not present

## 2021-05-05 DIAGNOSIS — H52203 Unspecified astigmatism, bilateral: Secondary | ICD-10-CM | POA: Diagnosis not present

## 2021-05-12 ENCOUNTER — Telehealth: Payer: Self-pay | Admitting: Family Medicine

## 2021-05-12 ENCOUNTER — Encounter: Payer: PPO | Admitting: Family Medicine

## 2021-05-12 DIAGNOSIS — R7303 Prediabetes: Secondary | ICD-10-CM

## 2021-05-12 DIAGNOSIS — I10 Essential (primary) hypertension: Secondary | ICD-10-CM

## 2021-05-12 DIAGNOSIS — Z87898 Personal history of other specified conditions: Secondary | ICD-10-CM

## 2021-05-12 NOTE — Telephone Encounter (Signed)
Sent patient mychart message

## 2021-05-12 NOTE — Telephone Encounter (Signed)
Pt. Called and wanted to see if he could do blood work a few days before his physical on 9/22?

## 2021-05-12 NOTE — Telephone Encounter (Signed)
Ok to place orders for lab work? Will schedule patient once placed

## 2021-05-17 ENCOUNTER — Other Ambulatory Visit (INDEPENDENT_AMBULATORY_CARE_PROVIDER_SITE_OTHER): Payer: PPO

## 2021-05-17 ENCOUNTER — Ambulatory Visit: Payer: PPO | Admitting: Cardiology

## 2021-05-17 ENCOUNTER — Encounter: Payer: Self-pay | Admitting: Family Medicine

## 2021-05-17 ENCOUNTER — Other Ambulatory Visit: Payer: Self-pay

## 2021-05-17 DIAGNOSIS — Z87898 Personal history of other specified conditions: Secondary | ICD-10-CM

## 2021-05-17 DIAGNOSIS — R7303 Prediabetes: Secondary | ICD-10-CM

## 2021-05-17 DIAGNOSIS — I10 Essential (primary) hypertension: Secondary | ICD-10-CM | POA: Diagnosis not present

## 2021-05-17 LAB — CBC
HCT: 47.3 % (ref 39.0–52.0)
Hemoglobin: 16.1 g/dL (ref 13.0–17.0)
MCHC: 34.2 g/dL (ref 30.0–36.0)
MCV: 93.2 fl (ref 78.0–100.0)
Platelets: 169 10*3/uL (ref 150.0–400.0)
RBC: 5.07 Mil/uL (ref 4.22–5.81)
RDW: 13.3 % (ref 11.5–15.5)
WBC: 5.5 10*3/uL (ref 4.0–10.5)

## 2021-05-17 LAB — COMPREHENSIVE METABOLIC PANEL
ALT: 19 U/L (ref 0–53)
AST: 19 U/L (ref 0–37)
Albumin: 4.5 g/dL (ref 3.5–5.2)
Alkaline Phosphatase: 67 U/L (ref 39–117)
BUN: 15 mg/dL (ref 6–23)
CO2: 27 mEq/L (ref 19–32)
Calcium: 9.6 mg/dL (ref 8.4–10.5)
Chloride: 101 mEq/L (ref 96–112)
Creatinine, Ser: 1 mg/dL (ref 0.40–1.50)
GFR: 70.03 mL/min (ref >60.00)
Glucose, Bld: 120 mg/dL — ABNORMAL HIGH (ref 70–99)
Potassium: 4 mEq/L (ref 3.5–5.1)
Sodium: 138 mEq/L (ref 135–145)
Total Bilirubin: 1.5 mg/dL — ABNORMAL HIGH (ref 0.2–1.2)
Total Protein: 7.6 g/dL (ref 6.0–8.3)

## 2021-05-17 LAB — PSA: PSA: 3.81 ng/mL (ref 0.10–4.00)

## 2021-05-17 LAB — HEMOGLOBIN A1C: Hgb A1c MFr Bld: 6.4 % (ref 4.6–6.5)

## 2021-05-17 NOTE — Addendum Note (Signed)
Addended by: Manuela Schwartz on: 05/17/2021 11:11 AM   Modules accepted: Orders

## 2021-05-18 NOTE — Progress Notes (Signed)
[facility] [address], [place], [place] [address] [phone]  Date:  05/20/2021   Name:  Curtis Tyler   DOB:  09/03/1940   MRN:  [id number]  PCP:  Curtis Mclean, MD    Chief Complaint: Annual Exam (Concerns/ questions: none/Flu shot today: going in 2 weeks with new covid booster)   History of Present Illness:  Curtis Tyler is a 80 y.o. very pleasant male patient who presents with the following:  Patient seen today for physical exam.  Most recent visit with myself was in April, we also had a video visit at the beginning of August when he was positive for COVID -he feels like he has recovered fully. His sx were really minimal   History of prediabetes, CAD, hyperlipidemia, hypertension  He sees urology, Dr. Diona Tyler For exercise he enjoys walking He is seen by ophthalmology at [facility], he volunteers regularly for ophthalmology research studies  He is also seen by cardiology, most recent visit with Dr. Percival Tyler about 1 year ago.  He will see him for follow-up soon   Flu vaccine-patient has this planned COVID-19 update-also has planned He had labs drawn recently in preparation for his visit ?  PSA- pt reports he is not longer being seen by urology   He is still highly functional, is able to drive to [facility] to visit family, walks for an hour+ most days and plays a lot of golf  Results for orders placed or performed in visit on 05/17/21  Hemoglobin A1c  Result Value Ref Range   Hgb A1c MFr Bld 6.4 4.6 - 6.5 %  Comprehensive metabolic panel  Result Value Ref Range   Sodium 138 135 - 145 mEq/L   Potassium 4.0 3.5 - 5.1 mEq/L   Chloride 101 96 - 112 mEq/L   CO2 27 19 - 32 mEq/L   Glucose, Bld 120 (H) 70 - 99 mg/dL   BUN 15 6 - 23 mg/dL   Creatinine, Ser 1.00 0.40 - 1.50 mg/dL   Total Bilirubin 1.5 (H) 0.2 - 1.2 mg/dL   Alkaline Phosphatase 67 39 - 117 U/L   AST 19 0 - 37 U/L   ALT 19 0 - 53 U/L    Total Protein 7.6 6.0 - 8.3 g/dL   Albumin 4.5 3.5 - 5.2 g/dL   GFR 70.03 >60.00 mL/min   Calcium 9.6 8.4 - 10.5 mg/dL  CBC  Result Value Ref Range   WBC 5.5 4.0 - 10.5 K/uL   RBC 5.07 4.22 - 5.81 Mil/uL   Platelets 169.0 150.0 - 400.0 K/uL   Hemoglobin 16.1 13.0 - 17.0 g/dL   HCT 47.3 39.0 - 52.0 %   MCV 93.2 78.0 - 100.0 fl   MCHC 34.2 30.0 - 36.0 g/dL   RDW 13.3 11.5 - 15.5 %  PSA  Result Value Ref Range   PSA 3.81 0.10 - 4.00 ng/mL    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    Patient Active Problem List   Diagnosis Date Noted   Educated about COVID-19 virus infection 05/11/2020   Lung nodule 04/10/2020   Elevated bilirubin 05/21/2019   Pre-diabetes 05/20/2019   Dyslipidemia 05/03/2019   Bradycardia 09/12/2018   Plantar porokeratosis, acquired 05/16/2018   Abnormal stress ECG with treadmill 02/12/2016   Colonic polyp 09/21/2015   Left lower quadrant pain 09/03/2015   Nasal congestion 09/03/2015   Cellulitis of right  lower extremity 05/14/2015   Need for immunization against influenza 05/14/2015   Bilateral low back pain with sciatica 09/03/2014   Hip pain, bilateral 09/03/2014   Bilateral carotid artery stenosis 07/21/2014   Myalgia 07/21/2014   Acute pain of left knee 07/15/2014   Benign prostatic hyperplasia with lower urinary tract symptoms 07/15/2014   Effusion of left knee 07/15/2014   History of elevated prostate specific antigen (PSA) 07/15/2014   Hypertrophy of nasal turbinates 07/15/2014   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 The LAD had a proximal 60-70% stenosis. There was mid 40% stenosis. Diagonal is moderate size with 40% stenosis. The circumflex system included a large OM with 60% ostial stenosis the RCA was occluded before the PDA. The EF was 65%.  2014   Glaucoma    beginning of glaucoma- uses drops    Hyperlipidemia    Hypertension    Myocardial infarct ([place]) 1992   inferior posterior- discovered in 1992-? when happened    Obesity    Radiculopathy    Shortness of breath dyspnea    with exertion -     Past Surgical History:  Procedure Laterality Date   [place]  05/2004   SHOWING 100% OCCLUDED DISTAL RIGHT CORONARY WITH  LEFT  TO RIGHT COLLATERALS, AS WELL AS ANTEGRADE FLOW, NORMAL LEFT MAIN, 50% NARROWING IN LEFT CIRCUMFLEX  WITH IRREGULARITIES IN THE LAD   CARDIAC CATHETERIZATION N/A 02/12/2016   Procedure: Left Heart Cath and Coronary Angiography;  Surgeon: Curtis Man, MD;  Location: [facility];  Service: Cardiovascular;  Laterality: N/A;   CARDIOVASCULAR STRESS TEST  02/26/2010   EF 65%, SMALL AREA OF MILD INFARCT AND POSSIBLE PER-INFARCT ISCHEMIA IN THE INFEROBASAL WALL   COLONOSCOPY     CORONARY ANGIOPLASTY     RIGHT CORONARY WERE UNSUCCESSFUL   CORONARY ARTERY BYPASS GRAFT N/A 02/25/2016   Procedure: CORONARY ARTERY BYPASS GRAFTING (CABG) times four using the left internal mammary artery and the right saphenus vein ;  Surgeon: Curtis Nakayama, MD;  Location: [facility];  Service: Open Heart Surgery;  Laterality: N/A;   HERNIA REPAIR  1960   right hernia repair   INTRAOPERATIVE TRANSESOPHAGEAL ECHOCARDIOGRAM N/A 02/25/2016   Procedure: INTRAOPERATIVE TRANSESOPHAGEAL ECHOCARDIOGRAM;  Surgeon: Curtis Nakayama, MD;  Location: [facility];  Service: Open  Heart Surgery;  Laterality: N/A;   UMBILICAL HERNIA REPAIR     US ECHOCARDIOGRAPHY  01/03/2007   EF 55-60%    Social History   Tobacco Use   Smoking status: Former    Packs/day: 1.00    Years: 25.00    Pack years: 25.00    Types: Cigarettes    Quit date: 07/23/1981    Years since quitting: 39.8   Smokeless tobacco: Never  Vaping Use   Vaping Use: Never used  Substance Use Topics   Alcohol use:  Yes    Alcohol/week: 8.0 standard drinks    Types: 7 Glasses of wine, 1 Cans of beer per week    Comment: glass of wine a night    Drug use: No    Family History  Problem Relation Age of Onset   Stroke Father    Congestive Heart Failure Mother    Diabetes Mother    Hypertension Neg Hx    Colon cancer Neg Hx    Colon polyps Neg Hx    Esophageal cancer Neg Hx    Rectal cancer Neg Hx    Stomach cancer Neg Hx     No Known Allergies  Medication list has been reviewed and updated.  Current Outpatient Medications on File Prior to Visit  Medication Sig Dispense Refill   amLODipine (NORVASC) 5 MG tablet TAKE ONE TABLET BY MOUTH DAILY 90 tablet 3   aspirin 81 MG tablet Take 81 mg by mouth daily.     Coenzyme Q10 100 MG TABS Take 100 mg by mouth daily.      dicyclomine (BENTYL) 10 MG capsule Take 1 pill by mouth every am. May take additional 3 times daily prn 50 capsule 3   ezetimibe (ZETIA) 10 MG tablet TAKE ONE TABLET BY MOUTH DAILY 90 tablet 3   fish oil-omega-3 fatty acids 1000 MG capsule Take 1,400 mg by mouth daily.     gabapentin (NEURONTIN) 300 MG capsule TAKE ONE CAPSULE BY MOUTH TWICE A DAY 180 capsule 1   Glucosamine-Chondroitin-MSM 500-200-150 MG TABS Take by mouth.     latanoprost (XALATAN) 0.005 % ophthalmic solution Place 1 drop into both eyes daily.     losartan (COZAAR) 100 MG tablet TAKE ONE TABLET BY MOUTH DAILY 90 tablet 3   Multiple Vitamins-Minerals (MULTIVITAMIN WITH MINERALS) tablet Take 1 tablet by mouth daily.     Plant Sterols and Stanols 450 MG TABS Take  900 mg by mouth daily.     rosuvastatin (CRESTOR) 10 MG tablet Take 1 tablet (10 mg total) by mouth every other day. 45 tablet 3   No current facility-administered medications on file prior to visit.    Review of Systems:  As per HPI- otherwise negative.   Physical Examination: Vitals:   05/20/21 1108  BP: 130/74  Pulse: 65  Resp: 18  Temp: 97.6 F (36.4 C)  SpO2: 95%   Vitals:   05/20/21 1108  Weight: 176 lb (79.8 kg)  Height: 5\' 2"  (1.575 m)   Body mass index is 32.19 kg/m. Ideal Body Weight: Weight in (lb) to have BMI = 25: 136.4  GEN: no acute distress.  Obese, looks well  HEENT: Atraumatic, Normocephalic.  Bilateral TM wnl, oropharynx normal.  PEERL,EOMI.   Ears and Nose: No external deformity. CV: RRR, No M/G/R. No JVD. No thrill. No extra heart sounds. PULM: CTA B, no wheezes, crackles, rhonchi. No retractions. No resp. distress. No accessory muscle use. ABD: S, NT, ND, +BS. No rebound. No HSM. EXTR: No c/c/e PSYCH: Normally interactive. Conversant.    Assessment and Plan: Physical exam  Dyslipidemia - Plan: rosuvastatin (CRESTOR) 10 MG tablet  Gilbert syndrome  Pre-diabetes  Essential hypertension  Patient seen today for physical exam.  Encouraged continued healthy diet and exercise routine We discussed recent labs He continues to be very physically active  Will request most recent urology note to follow-up on PSA; uncertain if he needs to see urology again.  Patient is asked to please contact me if he does not hear from  me about follow-up plan  This visit occurred during the SARS-CoV-2 public health emergency.  Safety protocols were in place, including screening questions prior to the visit, additional usage of staff PPE, and extensive cleaning of exam room while observing appropriate contact time as indicated for disinfecting solutions.   Signed Lamar Blinks, MD

## 2021-05-18 NOTE — Patient Instructions (Addendum)
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  We can do [facility] or [facility]

## 2021-05-20 ENCOUNTER — Other Ambulatory Visit: Payer: Self-pay

## 2021-05-20 ENCOUNTER — Ambulatory Visit (INDEPENDENT_AMBULATORY_CARE_PROVIDER_SITE_OTHER): Payer: PPO | Admitting: Family Medicine

## 2021-05-20 VITALS — BP 130/74 | HR 65 | Temp 97.6°F | Resp 18 | Ht 62.0 in | Wt 176.0 lb

## 2021-05-20 DIAGNOSIS — I10 Essential (primary) hypertension: Secondary | ICD-10-CM | POA: Diagnosis not present

## 2021-05-20 DIAGNOSIS — Z0000 Encounter for general adult medical examination without abnormal findings: Secondary | ICD-10-CM

## 2021-05-20 DIAGNOSIS — R7303 Prediabetes: Secondary | ICD-10-CM | POA: Diagnosis not present

## 2021-05-20 DIAGNOSIS — E785 Hyperlipidemia, unspecified: Secondary | ICD-10-CM | POA: Diagnosis not present

## 2021-05-20 MED ORDER — ROSUVASTATIN CALCIUM 10 MG PO TABS: 10.0000 mg | ORAL_TABLET | ORAL | 3 refills | Status: DC

## 2021-05-31 DIAGNOSIS — M25552 Pain in left hip: Secondary | ICD-10-CM | POA: Diagnosis not present

## 2021-06-03 ENCOUNTER — Encounter: Payer: Self-pay | Admitting: Family Medicine

## 2021-06-07 DIAGNOSIS — H401131 Primary open-angle glaucoma, bilateral, mild stage: Secondary | ICD-10-CM | POA: Diagnosis not present

## 2021-06-07 DIAGNOSIS — H25013 Cortical age-related cataract, bilateral: Secondary | ICD-10-CM | POA: Diagnosis not present

## 2021-06-07 DIAGNOSIS — H524 Presbyopia: Secondary | ICD-10-CM | POA: Diagnosis not present

## 2021-06-07 DIAGNOSIS — H52203 Unspecified astigmatism, bilateral: Secondary | ICD-10-CM | POA: Diagnosis not present

## 2021-06-07 DIAGNOSIS — H2513 Age-related nuclear cataract, bilateral: Secondary | ICD-10-CM | POA: Diagnosis not present

## 2021-06-07 DIAGNOSIS — H25043 Posterior subcapsular polar age-related cataract, bilateral: Secondary | ICD-10-CM | POA: Diagnosis not present

## 2021-06-07 DIAGNOSIS — H5203 Hypermetropia, bilateral: Secondary | ICD-10-CM | POA: Diagnosis not present

## 2021-06-30 ENCOUNTER — Encounter: Payer: Self-pay | Admitting: Cardiology

## 2021-06-30 ENCOUNTER — Ambulatory Visit: Payer: PPO | Admitting: Cardiology

## 2021-06-30 ENCOUNTER — Other Ambulatory Visit: Payer: Self-pay

## 2021-06-30 VITALS — BP 130/75 | HR 68 | Ht 63.0 in | Wt 180.8 lb

## 2021-06-30 DIAGNOSIS — R001 Bradycardia, unspecified: Secondary | ICD-10-CM | POA: Diagnosis not present

## 2021-06-30 DIAGNOSIS — E785 Hyperlipidemia, unspecified: Secondary | ICD-10-CM

## 2021-06-30 DIAGNOSIS — I10 Essential (primary) hypertension: Secondary | ICD-10-CM

## 2021-06-30 DIAGNOSIS — I2510 Atherosclerotic heart disease of native coronary artery without angina pectoris: Secondary | ICD-10-CM | POA: Diagnosis not present

## 2021-06-30 NOTE — Patient Instructions (Signed)
Medication Instructions:  The current medical regimen is effective;  continue present plan and medications.  *If you need a refill on your cardiac medications before your next appointment, please call your pharmacy*   Testing/Procedures: Your physician has requested that you have a carotid duplex. This test is an ultrasound of the carotid arteries in your neck. It looks at blood flow through these arteries that supply the brain with blood. Allow one hour for this exam. There are no restrictions or special instructions.    Follow-Up: At [facility], you and your health needs are our priority.  As part of our continuing mission to provide you with exceptional heart care, we have created designated Provider Care Teams.  These Care Teams include your primary Cardiologist (physician) and Advanced Practice Providers (APPs -  Physician Assistants and Nurse Practitioners) who all work together to provide you with the care you need, when you need it.  We recommend signing up for the patient portal called "MyChart".  Sign up information is provided on this After Visit Summary.  MyChart is used to connect with patients for Virtual Visits (Telemedicine).  Patients are able to view lab/test results, encounter notes, upcoming appointments, etc.  Non-urgent messages can be sent to your provider as well.   To learn more about what you can do with MyChart, go to [email or web address].    Your next appointment:   12 month(s)  The format for your next appointment:   In Person  Provider:   Minus Breeding, MD

## 2021-06-30 NOTE — Progress Notes (Signed)
Cardiology Office Note   Date:  06/30/2021   ID:  Curtis Tyler, DOB 05/10/1941, MRN [id number]  PCP:  [name], MD  Cardiologist:   None   Chief Complaint  Patient presents with   Coronary Artery Disease       History of Present Illness: Curtis Tyler is a 80 y.o. male who presents for follow up of CAD.  He had a cardiac cath in 2014 demonstrating left main 60% stenosis. The LAD had a proximal 60-70% stenosis. There was mid 40% stenosis. Diagonal is moderate size with 40% stenosis. The circumflex system included a large OM with 60% ostial stenosis the RCA was occluded before the PDA. The EF was 65%. However, we decided to manage him medically unless he had worsening symptoms. In May 2017 I sent him for a screening POET (Plain Old Exercise Treadmill).  This was abnormal with ST depression and was a high risk study. He then underwent cardiac catheterization where he was found to have a 60% ostial left main, a 90% proximal LAD, 90% proximal circumflex, and a diffusely diseased right coronary with a chronically totally occluded posterior descending.  He was taken the operating room on 02/25/2016 for CABG x 4 by Dr. Roxan Hockey.   He had bradycardia and I stopped his beta blocker.  A Holter demonstrated sinus with Mobitz Type 1.  He did have brief runs of 2:1 block without symptoms.  There were no sustained pauses.    Since I last saw him he has done well.  The patient denies any new symptoms such as chest discomfort, neck or arm discomfort. There has been no new shortness of breath, PND or orthopnea. There have been no reported palpitations, presyncope or syncope.   He is walking and gets 12,000 steps per day!  Past Medical History:  Diagnosis Date   Arthritis    BPH (benign prostatic hyperplasia)    Bunion    Callus    Carotid artery occlusion    Coronary artery disease    left main 60% stenosis. The LAD had a proximal 60-70% stenosis. There was mid 40% stenosis. Diagonal is  moderate size with 40% stenosis. The circumflex system included a large OM with 60% ostial stenosis the RCA was occluded before the PDA. The EF was 65%.  2014   Glaucoma    beginning of glaucoma- uses drops    Hyperlipidemia    Hypertension    Myocardial infarct ([place]) 1992   inferior posterior- discovered in 1992-? when happened    Obesity    Radiculopathy    Shortness of breath dyspnea    with exertion -     Past Surgical History:  Procedure Laterality Date   [place]  05/2004   SHOWING 100% OCCLUDED DISTAL RIGHT CORONARY WITH  LEFT  TO RIGHT COLLATERALS, AS WELL AS ANTEGRADE FLOW, NORMAL LEFT MAIN, 50% NARROWING IN LEFT CIRCUMFLEX  WITH IRREGULARITIES IN THE LAD   CARDIAC CATHETERIZATION N/A 02/12/2016   Procedure: Left Heart Cath and Coronary Angiography;  Surgeon: Leonie Man, MD;  Location: [facility];  Service: Cardiovascular;  Laterality: N/A;   CARDIOVASCULAR STRESS TEST  02/26/2010   EF 65%, SMALL AREA OF MILD INFARCT AND POSSIBLE PER-INFARCT ISCHEMIA IN THE INFEROBASAL WALL   COLONOSCOPY     CORONARY ANGIOPLASTY     RIGHT CORONARY WERE UNSUCCESSFUL   CORONARY ARTERY BYPASS GRAFT N/A 02/25/2016   Procedure: CORONARY ARTERY BYPASS GRAFTING (CABG) times  four using the left internal mammary artery and the right saphenus vein ;  Surgeon: [name], MD;  Location: [facility];  Service: Open Heart Surgery;  Laterality: N/A;   HERNIA REPAIR  1960   right hernia repair   INTRAOPERATIVE TRANSESOPHAGEAL ECHOCARDIOGRAM N/A 02/25/2016   Procedure: INTRAOPERATIVE TRANSESOPHAGEAL ECHOCARDIOGRAM;  Surgeon: [name], MD;  Location: [facility];  Service: Open Heart Surgery;  Laterality: N/A;   UMBILICAL HERNIA REPAIR     US ECHOCARDIOGRAPHY  01/03/2007   EF 55-60%     Current Outpatient Medications  Medication Sig Dispense Refill   amLODipine (NORVASC) 5 MG tablet TAKE ONE TABLET BY MOUTH DAILY 90 tablet 3   aspirin 81 MG tablet  Take 81 mg by mouth daily.     Coenzyme Q10 100 MG TABS Take 100 mg by mouth daily.      dicyclomine (BENTYL) 10 MG capsule Take 1 pill by mouth every am. May take additional 3 times daily prn 50 capsule 3   ezetimibe (ZETIA) 10 MG tablet TAKE ONE TABLET BY MOUTH DAILY 90 tablet 3   fish oil-omega-3 fatty acids 1000 MG capsule Take 1,400 mg by mouth daily.     gabapentin (NEURONTIN) 300 MG capsule TAKE ONE CAPSULE BY MOUTH TWICE A DAY 180 capsule 1   Glucosamine-Chondroitin-MSM 500-200-150 MG TABS Take by mouth.     latanoprost (XALATAN) 0.005 % ophthalmic solution Place 1 drop into both eyes daily.     losartan (COZAAR) 100 MG tablet TAKE ONE TABLET BY MOUTH DAILY 90 tablet 3   [START ON 08/18/2021] moxifloxacin (VIGAMOX) 0.5 % ophthalmic solution Place 1 drop into the right eye 4 times daily. Start 3 hours after you arrive home from surgery.     Multiple Vitamins-Minerals (MULTIVITAMIN WITH MINERALS) tablet Take 1 tablet by mouth daily.     Plant Sterols and Stanols 450 MG TABS Take 900 mg by mouth daily.     [START ON 08/18/2021] prednisoLONE acetate (PRED FORTE) 1 % ophthalmic suspension Place 1 drop into the right eye 4 times daily. Start 3 hours AFTER you arrive home from surgery.     rosuvastatin (CRESTOR) 10 MG tablet Take 1 tablet (10 mg total) by mouth every other day. 45 tablet 3   No current facility-administered medications for this visit.    Allergies:   Patient has no known allergies.    ROS:  Please see the history of present illness.   Otherwise, review of systems are positive for none.   All other systems are reviewed and negative.    PHYSICAL EXAM: VS:  BP 130/75   Pulse 68   Ht 5\' 3"  (1.6 m)   Wt 180 lb 12.8 oz (82 kg)   SpO2 98%   BMI 32.03 kg/m  , BMI Body mass index is 32.03 kg/m. GENERAL:  Well appearing NECK:  No jugular venous distention, waveform within normal limits, carotid upstroke brisk and symmetric, no bruits, no thyromegaly LUNGS:  Clear to  auscultation bilaterally CHEST:  Well healed sternotomy scar. HEART:  PMI not displaced or sustained,S1 and S2 within normal limits, no S3, no S4, no clicks, no rubs, no murmurs ABD:  Flat, positive bowel sounds normal in frequency in pitch, no bruits, no rebound, no guarding, no midline pulsatile mass, no hepatomegaly, no splenomegaly EXT:  2 plus pulses throughout, no edema, no cyanosis no clubbing   EKG:  EKG is  ordered today. The ekg ordered today demonstrates sinus rhythm, rate 68, incomplete  right bundle branch block, no acute ST-T wave changes.   Recent Labs: 05/17/2021: ALT 19; BUN 15; Creatinine, Ser 1.00; Hemoglobin 16.1; Platelets 169.0; Potassium 4.0; Sodium 138    Lipid Panel    Component Value Date/Time   CHOL 178 11/26/2020 0906   TRIG 105.0 11/26/2020 0906   HDL 61.40 11/26/2020 0906   CHOLHDL 3 11/26/2020 0906   VLDL 21.0 11/26/2020 0906   LDLCALC 95 11/26/2020 0906      Wt Readings from Last 3 Encounters:  06/30/21 180 lb 12.8 oz (82 kg)  05/20/21 176 lb (79.8 kg)  11/30/20 179 lb (81.2 kg)      Other studies Reviewed: Additional studies/ records that were reviewed today include: Labs Review of the above records demonstrates:  Please see elsewhere in the note.     ASSESSMENT AND PLAN:    CAD s/p CABG:    The patient has no new sypmtoms.  No further cardiovascular testing is indicated.  We will continue with aggressive risk reduction and meds as listed.   HTN:   The blood pressure is at target.  No change in therapy.   HYPERLIPIDEMIA:     LDL is 95 with an HDL of 62.  He has not tolerated statin titration but he is tolerating the Zetia that was started last year.  No change in therapy.    CAROTID STENOSIS:     I will follow-up carotid Dopplers as its been 3 years since his last 1.    Current medicines are reviewed at length with the patient today.  The patient does not have concerns regarding medicines.  The following changes have been made:    None  Labs/ tests ordered today include: None  Orders Placed This Encounter  Procedures   EKG 12-Lead   VAS US CAROTID      Disposition:   FU with me in 12 months    Signed, Minus Breeding, MD  06/30/2021 5:40 PM    [facility]

## 2021-07-02 ENCOUNTER — Ambulatory Visit (HOSPITAL_COMMUNITY)
Admission: RE | Admit: 2021-07-02 | Discharge: 2021-07-02 | Disposition: A | Discharge status: Home / Self Care | Payer: PPO | Source: Ambulatory Visit | Attending: Cardiology | Admitting: Cardiology

## 2021-07-02 ENCOUNTER — Other Ambulatory Visit: Payer: Self-pay

## 2021-07-02 ENCOUNTER — Other Ambulatory Visit: Payer: Self-pay | Admitting: Cardiology

## 2021-07-02 DIAGNOSIS — I6523 Occlusion and stenosis of bilateral carotid arteries: Secondary | ICD-10-CM | POA: Diagnosis not present

## 2021-07-14 ENCOUNTER — Other Ambulatory Visit: Payer: Self-pay | Admitting: Cardiology

## 2021-07-20 ENCOUNTER — Encounter: Payer: Self-pay | Admitting: Family

## 2021-07-20 ENCOUNTER — Encounter: Payer: Self-pay | Admitting: Family Medicine

## 2021-07-20 ENCOUNTER — Ambulatory Visit (INDEPENDENT_AMBULATORY_CARE_PROVIDER_SITE_OTHER): Payer: PPO | Admitting: Family

## 2021-07-20 ENCOUNTER — Other Ambulatory Visit: Payer: Self-pay

## 2021-07-20 ENCOUNTER — Telehealth: Payer: PPO | Admitting: Internal Medicine

## 2021-07-20 VITALS — BP 160/72 | HR 66 | Temp 97.9°F | Resp 18 | Ht 63.0 in | Wt 181.5 lb

## 2021-07-20 DIAGNOSIS — I10 Essential (primary) hypertension: Secondary | ICD-10-CM

## 2021-07-20 DIAGNOSIS — E785 Hyperlipidemia, unspecified: Secondary | ICD-10-CM

## 2021-07-20 DIAGNOSIS — Z823 Family history of stroke: Secondary | ICD-10-CM | POA: Diagnosis not present

## 2021-07-20 MED ORDER — AMLODIPINE BESYLATE 5 MG PO TABS: 10.0000 mg | ORAL_TABLET | Freq: Every day | ORAL | 3 refills | Status: DC

## 2021-07-20 NOTE — Progress Notes (Signed)
Acute Office Visit  Subjective:    Patient ID: Curtis Tyler, male    DOB: 10/21/1940, 80 y.o.   MRN: [id number]  Chief Complaint  Patient presents with  . Hypertension    BP last night 198/94 wrist cuff    HPI Patient is in today with c/o elevated blood pressure readings at home. He reports using a wrist cuff and consistently seeing blood pressure readings in the 190s. He has grown concerned due to his family history of CVAs. Saw cardiology recently and reports his visit was unremarkable except having stable carotid artery blockage. Currently takes Losartan 100 mg daily  in the evenings and Norvasc 5 mg daily. Has been on 10 mg of norvasc approximately 4-5 years ago that he reports dropped his blood pressure to low and caused him to have dizziness.  Exercises routinely.   Past Medical History:  Diagnosis Date  . Arthritis   . BPH (benign prostatic hyperplasia)   . Bunion   . Callus   . Carotid artery occlusion   . Coronary artery disease    left main 60% stenosis. The LAD had a proximal 60-70% stenosis. There was mid 40% stenosis. Diagonal is moderate size with 40% stenosis. The circumflex system included a large OM with 60% ostial stenosis the RCA was occluded before the PDA. The EF was 65%.  2014  . Glaucoma    beginning of glaucoma- uses drops   . Hyperlipidemia   . Hypertension   . Myocardial infarct ([place]) 1992   inferior posterior- discovered in 1992-? when happened   . Obesity   . Radiculopathy   . Shortness of breath dyspnea    with exertion -     Past Surgical History:  Procedure Laterality Date  . APPENDECTOMY  1958  . CARDIAC CATHETERIZATION  05/2004   SHOWING 100% OCCLUDED DISTAL RIGHT CORONARY WITH  LEFT  TO RIGHT COLLATERALS, AS WELL AS ANTEGRADE FLOW, NORMAL LEFT MAIN, 50% NARROWING IN LEFT CIRCUMFLEX  WITH IRREGULARITIES IN THE LAD  . CARDIAC CATHETERIZATION N/A 02/12/2016   Procedure: Left Heart Cath and Coronary Angiography;  Surgeon: Leonie Man, MD;   Location: [facility];  Service: Cardiovascular;  Laterality: N/A;  . CARDIOVASCULAR STRESS TEST  02/26/2010   EF 65%, SMALL AREA OF MILD INFARCT AND POSSIBLE PER-INFARCT ISCHEMIA IN THE INFEROBASAL WALL  . COLONOSCOPY    . CORONARY ANGIOPLASTY     RIGHT CORONARY WERE UNSUCCESSFUL  . CORONARY ARTERY BYPASS GRAFT N/A 02/25/2016   Procedure: CORONARY ARTERY BYPASS GRAFTING (CABG) times four using the left internal mammary artery and the right saphenus vein ;  Surgeon: [name], MD;  Location: [facility];  Service: Open Heart Surgery;  Laterality: N/A;  . HERNIA REPAIR  1960   right hernia repair  . INTRAOPERATIVE TRANSESOPHAGEAL ECHOCARDIOGRAM N/A 02/25/2016   Procedure: INTRAOPERATIVE TRANSESOPHAGEAL ECHOCARDIOGRAM;  Surgeon: [name], MD;  Location: [facility];  Service: Open Heart Surgery;  Laterality: N/A;  . UMBILICAL HERNIA REPAIR    . US ECHOCARDIOGRAPHY  01/03/2007   EF 55-60%    Family History  Problem Relation Age of Onset  . Stroke Father   . Congestive Heart Failure Mother   . Diabetes Mother   . Hypertension Neg Hx   . Colon cancer Neg Hx   . Colon polyps Neg Hx   . Esophageal cancer Neg Hx   . Rectal cancer Neg Hx   . Stomach cancer Neg Hx     Social  History   Socioeconomic History  . Marital status: Married    Spouse name: Sharyn Lull  . Number of children: 2  . Years of education: HS  . Highest education level: Not on file  Occupational History  . Occupation: retired  Tobacco Use  . Smoking status: Former    Packs/day: 1.00    Years: 25.00    Pack years: 25.00    Types: Cigarettes    Quit date: 07/23/1981    Years since quitting: 40.0  . Smokeless tobacco: Never  Vaping Use  . Vaping Use: Never used  Substance and Sexual Activity  . Alcohol use: Yes    Alcohol/week: 8.0 standard drinks    Types: 7 Glasses of wine, 1 Cans of beer per week    Comment: glass of wine a night   . Drug use: No  . Sexual activity: Yes  Other Topics Concern   . Not on file  Social History Narrative   Positive HTN and Stroke among his parents who lived up until their late 108's   Patient lives at home with his spouse.   Caffeine Use: 2-4 cups daily   Social Determinants of Health   Financial Resource Strain: Not on file  Food Insecurity: Not on file  Transportation Needs: Not on file  Physical Activity: Not on file  Stress: Not on file  Social Connections: Not on file  Intimate Partner Violence: Not on file    Outpatient Medications Prior to Visit  Medication Sig Dispense Refill  . aspirin 81 MG tablet Take 81 mg by mouth daily.    . Coenzyme Q10 100 MG TABS Take 100 mg by mouth daily.     Marland Kitchen dicyclomine (BENTYL) 10 MG capsule Take 1 pill by mouth every am. May take additional 3 times daily prn 50 capsule 3  . ezetimibe (ZETIA) 10 MG tablet TAKE ONE TABLET BY MOUTH DAILY 90 tablet 3  . fish oil-omega-3 fatty acids 1000 MG capsule Take 1,400 mg by mouth daily.    Marland Kitchen gabapentin (NEURONTIN) 300 MG capsule TAKE ONE CAPSULE BY MOUTH TWICE A DAY 180 capsule 1  . Glucosamine-Chondroitin-MSM 500-200-150 MG TABS Take by mouth.    . latanoprost (XALATAN) 0.005 % ophthalmic solution Place 1 drop into both eyes daily.    Marland Kitchen losartan (COZAAR) 100 MG tablet TAKE ONE TABLET BY MOUTH DAILY 90 tablet 3  . Multiple Vitamins-Minerals (MULTIVITAMIN WITH MINERALS) tablet Take 1 tablet by mouth daily.    . Plant Sterols and Stanols 450 MG TABS Take 900 mg by mouth daily.    . rosuvastatin (CRESTOR) 10 MG tablet Take 1 tablet (10 mg total) by mouth every other day. 45 tablet 3  . amLODipine (NORVASC) 5 MG tablet TAKE ONE TABLET BY MOUTH DAILY 90 tablet 3  . [START ON 08/18/2021] moxifloxacin (VIGAMOX) 0.5 % ophthalmic solution Place 1 drop into the right eye 4 times daily. Start 3 hours after you arrive home from surgery. (Patient not taking: Reported on 07/20/2021)    . [START ON 08/18/2021] prednisoLONE acetate (PRED FORTE) 1 % ophthalmic suspension Place 1 drop  into the right eye 4 times daily. Start 3 hours AFTER you arrive home from surgery. (Patient not taking: Reported on 07/20/2021)     No facility-administered medications prior to visit.    No Known Allergies  Review of Systems  All other systems reviewed and are negative.     Objective:    Physical Exam Vitals and nursing note reviewed.  Constitutional:  Appearance: Normal appearance.  HENT:     Head: Normocephalic and atraumatic.     Right Ear: Tympanic membrane normal.     Left Ear: Tympanic membrane normal.  Cardiovascular:     Rate and Rhythm: Normal rate and regular rhythm.     Comments: Recheck BP: 158/78 Pulmonary:     Effort: Pulmonary effort is normal.     Breath sounds: Normal breath sounds.  Abdominal:     General: Abdomen is flat.     Palpations: Abdomen is soft.  Musculoskeletal:        General: Normal range of motion.     Cervical back: Normal range of motion and neck supple.  Skin:    General: Skin is warm and dry.  Neurological:     General: No focal deficit present.     Mental Status: He is alert and oriented to person, place, and time.  Psychiatric:        Mood and Affect: Mood normal.        Behavior: Behavior normal.   BP (!) 160/72   Pulse 66   Temp 97.9 F (36.6 C) (Oral)   Resp 18   Ht 5\' 3"  (1.6 m)   Wt 181 lb 8 oz (82.3 kg)   SpO2 97%   BMI 32.15 kg/m  Wt Readings from Last 3 Encounters:  07/20/21 181 lb 8 oz (82.3 kg)  06/30/21 180 lb 12.8 oz (82 kg)  05/20/21 176 lb (79.8 kg)    Health Maintenance Due  Topic Date Due  . Pneumonia Vaccine 65+ Years old (1 - PCV) Never done  . COVID-19 Vaccine (4 - Booster for Pfizer series) 03/08/2021    There are no preventive care reminders to display for this patient.   No results found for: TSH Lab Results  Component Value Date   WBC 5.5 05/17/2021   HGB 16.1 05/17/2021   HCT 47.3 05/17/2021   MCV 93.2 05/17/2021   PLT 169.0 05/17/2021   Lab Results  Component Value Date    NA 138 05/17/2021   K 4.0 05/17/2021   CO2 27 05/17/2021   GLUCOSE 120 (H) 05/17/2021   BUN 15 05/17/2021   CREATININE 1.00 05/17/2021   BILITOT 1.5 (H) 05/17/2021   ALKPHOS 67 05/17/2021   AST 19 05/17/2021   ALT 19 05/17/2021   PROT 7.6 05/17/2021   ALBUMIN 4.5 05/17/2021   CALCIUM 9.6 05/17/2021   ANIONGAP 6 02/28/2016   GFR 70.03 05/17/2021   Lab Results  Component Value Date   CHOL 178 11/26/2020   Lab Results  Component Value Date   HDL 61.40 11/26/2020   Lab Results  Component Value Date   LDLCALC 95 11/26/2020   Lab Results  Component Value Date   TRIG 105.0 11/26/2020   Lab Results  Component Value Date   CHOLHDL 3 11/26/2020   Lab Results  Component Value Date   HGBA1C 6.4 05/17/2021       Assessment & Plan:   Problem List Items Addressed This Visit     Hyperlipidemia with target LDL less than 70 (Chronic)   Relevant Medications   amLODipine (NORVASC) 5 MG tablet   Essential hypertension - Primary (Chronic)   Relevant Medications   amLODipine (NORVASC) 5 MG tablet   Other Visit Diagnoses     Family history of CVA            Meds ordered this encounter  Medications  . amLODipine (NORVASC) 5 MG tablet  Sig: Take 2 tablets (10 mg total) by mouth daily.    Dispense:  180 tablet    Refill:  3   Dorr was seen today for hypertension.  Diagnoses and all orders for this visit:  Essential hypertension  Hyperlipidemia with target LDL less than 70  Family history of CVA  Other orders -     amLODipine (NORVASC) 5 MG tablet; Take 2 tablets (10 mg total) by mouth daily.  Patient will increase Amlodipine to 10 mg daily. Advised that swelling could potentially occur, if it does give the office a call. Continue exercising and monitoring blood pressure. Systolic blood pressure from his home wrist cuff in comparison to our blood pressure in office is approximately 20 points higher. Patient aware. To the ED if he develops chest pain or  SOB.  Kennyth Arnold, FNP

## 2021-07-23 NOTE — Progress Notes (Signed)
[facility] [address] [place], [place] [address] [phone]  Date:  07/28/2021   Name:  Curtis Tyler   DOB:  [birth year]   MRN:  [id number]  PCP:  [name], MD    Chief Complaint: Hypertension (Pt has been having elevated BP readings. )   History of Present Illness:  Curtis Tyler is a 80 y.o. very pleasant male patient who presents with the following:  Pt seen today for BP follow-up- History of prediabetes, CAD, hyperlipidemia, hypertension Last visit with myself 9/22 for CPE  Seen by cardiology on 11/2-  CAD s/p CABG:    The patient has no new sypmtoms.  No further cardiovascular testing is indicated.  We will continue with aggressive risk reduction and meds as listed.  HTN:   The blood pressure is at target.  No change in therapy.  HYPERLIPIDEMIA:     LDL is 95 with an HDL of 62.  He has not tolerated statin titration but he is tolerating the Zetia that was started last year.  No change in therapy.  CAROTID STENOSIS:     I will follow-up carotid Dopplers as its been 3 years since his last 1.   He recently contacted [place] with concern of elevated BP- he was seen by Ms Justin Mend, [place] on 11/22 after he contacted me with concern of elevated BP  BP Readings from Last 3 Encounters:  07/28/21 132/72  07/20/21 (!) 160/72  06/30/21 130/75   BP was found to be elevated, amlodipine was increased from 5 to 10 mg Also losartan 100  Crestor 10 Zetia 10 Gabapentin 300 twice daily  Can offer pneumonia booster, history is uncertain- pt notes he has completed this series  COVID-19 bivalent- done  Flu vaccine- done  Most recent labs done in September, renal function normal and A1c 6.4%  Pt will feel when his BP is too high This am he felt great- BP was 119/68 However for example yesterday he had some SBP of 140- 150.  He notes that his BP seems to go up and down a lot and this is concerning him  He has not had any CP  or SOB May have a slight HA off and on   He is taking both amlodipine and losartan in the am  Patient Active Problem List   Diagnosis Date Noted   Educated about COVID-19 virus infection 05/11/2020   Lung nodule 04/10/2020   Elevated bilirubin 05/21/2019   Pre-diabetes 05/20/2019   Dyslipidemia 05/03/2019   Bradycardia 09/12/2018   Plantar porokeratosis, acquired 05/16/2018   Abnormal stress ECG with treadmill 02/12/2016   Colonic polyp 09/21/2015   Left lower quadrant pain 09/03/2015   Nasal congestion 09/03/2015   Cellulitis of right lower extremity 05/14/2015   Need for immunization against influenza 05/14/2015   Bilateral low back pain with sciatica 09/03/2014   Hip pain, bilateral 09/03/2014   Bilateral carotid artery stenosis 07/21/2014   Myalgia 07/21/2014   Acute pain of left knee 07/15/2014   Benign prostatic hyperplasia with lower urinary tract symptoms 07/15/2014   Effusion of left knee 07/15/2014   History of elevated prostate specific antigen (PSA) 07/15/2014   Hypertrophy of nasal turbinates 07/15/2014   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 The LAD had a proximal 60-70% stenosis. There was mid 40% stenosis. Diagonal is moderate size with 40% stenosis. The circumflex system included a large OM with 60% ostial stenosis the RCA was occluded before the PDA. The EF was 65%.  2014    Glaucoma    beginning of glaucoma- uses drops    Hyperlipidemia    Hypertension    Myocardial infarct ([place]) 1992   inferior posterior- discovered in 1992-? when happened    Obesity    Radiculopathy    Shortness of breath dyspnea    with exertion -     Past Surgical History:  Procedure Laterality Date   [place]  05/2004   SHOWING 100% OCCLUDED DISTAL RIGHT CORONARY WITH  LEFT  TO RIGHT COLLATERALS, AS WELL AS ANTEGRADE FLOW, NORMAL LEFT MAIN, 50% NARROWING IN LEFT CIRCUMFLEX  WITH IRREGULARITIES IN THE LAD   CARDIAC CATHETERIZATION N/A 02/12/2016   Procedure: Left Heart Cath and Coronary Angiography;  Surgeon: Leonie Man, MD;  Location: [facility];  Service: Cardiovascular;  Laterality: N/A;   CARDIOVASCULAR STRESS TEST  02/26/2010   EF 65%, SMALL AREA OF MILD INFARCT AND POSSIBLE PER-INFARCT ISCHEMIA IN THE INFEROBASAL WALL   COLONOSCOPY     CORONARY ANGIOPLASTY     RIGHT CORONARY WERE UNSUCCESSFUL   CORONARY ARTERY BYPASS GRAFT N/A 02/25/2016   Procedure: CORONARY ARTERY BYPASS GRAFTING (CABG) times four using the left internal mammary artery and the right saphenus vein ;  Surgeon: [name], MD;  Location: [facility];  Service: Open Heart Surgery;  Laterality: N/A;   HERNIA REPAIR  1960   right hernia repair   INTRAOPERATIVE TRANSESOPHAGEAL ECHOCARDIOGRAM N/A 02/25/2016   Procedure: INTRAOPERATIVE TRANSESOPHAGEAL ECHOCARDIOGRAM;  Surgeon: [name], MD;  Location: [facility];  Service: Open Heart Surgery;  Laterality: N/A;   UMBILICAL HERNIA REPAIR     US ECHOCARDIOGRAPHY  01/03/2007   EF 55-60%    Social History   Tobacco Use   Smoking status: Former    Packs/day: 1.00    Years: 25.00    Pack years: 25.00    Types: Cigarettes    Quit date: 07/23/1981    Years since quitting: 40.0   Smokeless tobacco: Never  Vaping Use   Vaping Use: Never used  Substance Use Topics   Alcohol use: Yes    Alcohol/week: 8.0  standard drinks    Types: 7 Glasses of wine, 1 Cans of beer per week    Comment: glass of wine a night    Drug use: No    Family History  Problem Relation Age of Onset   Stroke Father    Congestive Heart Failure Mother    Diabetes Mother    Hypertension Neg Hx    Colon cancer Neg Hx    Colon polyps Neg Hx    Esophageal cancer Neg Hx    Rectal cancer Neg Hx    Stomach cancer Neg Hx     No Known Allergies  Medication list has been reviewed and updated.  Current Outpatient Medications on File Prior to Visit  Medication Sig Dispense Refill   amLODipine (NORVASC) 10 MG tablet Take 10 mg by mouth daily.     aspirin 81 MG tablet Take 81 mg by mouth daily.     Coenzyme Q10 100 MG TABS Take 100 mg by mouth daily.      dicyclomine (BENTYL) 10 MG capsule Take 1 pill by mouth every am. May take additional 3 times daily prn 50 capsule 3   ezetimibe (ZETIA) 10 MG tablet TAKE ONE TABLET BY MOUTH DAILY 90 tablet 3   fish oil-omega-3 fatty acids 1000 MG capsule Take 1,400 mg by mouth daily.     gabapentin (NEURONTIN) 300 MG capsule TAKE ONE CAPSULE BY MOUTH TWICE A DAY 180 capsule 1   Glucosamine-Chondroitin-MSM 500-200-150 MG TABS Take by mouth.     latanoprost (XALATAN) 0.005 % ophthalmic solution Place 1 drop into both eyes daily.     losartan (COZAAR) 100 MG tablet TAKE ONE TABLET BY MOUTH DAILY 90 tablet 3   [START ON 08/18/2021] moxifloxacin (VIGAMOX) 0.5 % ophthalmic solution      Multiple Vitamins-Minerals (MULTIVITAMIN WITH MINERALS) tablet Take 1 tablet by mouth daily.     Plant Sterols and Stanols 450 MG TABS Take 900 mg by mouth daily.     [START ON 08/18/2021] prednisoLONE acetate (PRED FORTE) 1 % ophthalmic suspension      rosuvastatin (CRESTOR) 10 MG tablet Take 1 tablet (10 mg total) by mouth every other day. 45 tablet 3   No current facility-administered medications on file prior to visit.    Review of Systems:  As per HPI- otherwise negative.   Physical  Examination: Vitals:   07/28/21 1522  BP: 132/72  Pulse: 72  Resp: 18  Temp: 97.8 F (36.6 C)  SpO2: 97%   Vitals:   07/28/21 1522  Weight: 181 lb 12.8 oz (82.5 kg)   Body mass index is 32.2 kg/m. Ideal Body Weight:    GEN: no acute distress.  Mild obesity, looks well  HEENT: Atraumatic, Normocephalic.  Ears and Nose: No external deformity. CV: RRR, No M/G/R. No JVD. No thrill. No extra heart sounds. PULM: CTA B, no wheezes, crackles, rhonchi. No retractions. No resp. distress. No accessory muscle use. EXTR: No c/c/e PSYCH: Normally interactive. Conversant.    Assessment and Plan:  Essential hypertension Pt continues to have concern about BP fluctuation and some high readings esp in the am Will have him take amlodipine in the evening in hopes of more even control   He will let me know if he continues to  have concerns - I can order a 24 hour monitor if needed   Signed Lamar Blinks, MD

## 2021-07-28 ENCOUNTER — Other Ambulatory Visit: Payer: Self-pay

## 2021-07-28 ENCOUNTER — Ambulatory Visit (INDEPENDENT_AMBULATORY_CARE_PROVIDER_SITE_OTHER): Payer: PPO | Admitting: Family Medicine

## 2021-07-28 VITALS — BP 132/72 | HR 72 | Temp 97.8°F | Resp 18 | Wt 181.8 lb

## 2021-07-28 DIAGNOSIS — N401 Enlarged prostate with lower urinary tract symptoms: Secondary | ICD-10-CM | POA: Diagnosis not present

## 2021-07-28 DIAGNOSIS — R350 Frequency of micturition: Secondary | ICD-10-CM | POA: Diagnosis not present

## 2021-07-28 DIAGNOSIS — R972 Elevated prostate specific antigen [PSA]: Secondary | ICD-10-CM | POA: Diagnosis not present

## 2021-07-28 DIAGNOSIS — I10 Essential (primary) hypertension: Secondary | ICD-10-CM | POA: Diagnosis not present

## 2021-07-28 NOTE — Patient Instructions (Addendum)
Please try taking losartan in the am and amlodipine in the evening  If you continue to note unusual BP fluctuation please send me a message and we can do a 24 hour blood pressure monitor for you

## 2021-08-02 ENCOUNTER — Ambulatory Visit: Payer: PPO | Admitting: Cardiology

## 2021-08-02 ENCOUNTER — Encounter: Payer: Self-pay | Admitting: Family Medicine

## 2021-08-02 DIAGNOSIS — I10 Essential (primary) hypertension: Secondary | ICD-10-CM

## 2021-08-02 MED ORDER — HYDROCHLOROTHIAZIDE 12.5 MG PO CAPS: 12.5000 mg | ORAL_CAPSULE | Freq: Every day | ORAL | 3 refills | Status: DC

## 2021-08-03 ENCOUNTER — Encounter: Payer: Self-pay | Admitting: Cardiology

## 2021-08-12 ENCOUNTER — Ambulatory Visit: Payer: PPO | Admitting: Cardiology

## 2021-08-15 NOTE — Progress Notes (Signed)
Cardiology Office Note   Date:  08/16/2021   ID:  Curtis Tyler, DOB 11-07-1940, MRN [id number]  PCP:  [name], MD  Cardiologist:   None   Chief Complaint  Patient presents with   Hypertension       History of Present Illness: Curtis Tyler is a 80 y.o. male who presents for follow up of CAD.  He had a cardiac cath in 2014 demonstrating left main 60% stenosis. The LAD had a proximal 60-70% stenosis. There was mid 40% stenosis. Diagonal is moderate size with 40% stenosis. The circumflex system included a large OM with 60% ostial stenosis the RCA was occluded before the PDA. The EF was 65%. However, we decided to manage him medically unless he had worsening symptoms. In May 2017 I sent him for a screening POET ([place] Exercise Treadmill).  This was abnormal with ST depression and was a high risk study. He then underwent cardiac catheterization where he was found to have a 60% ostial left main, a 90% proximal LAD, 90% proximal circumflex, and a diffusely diseased right coronary with a chronically totally occluded posterior descending.  He was taken the operating room on 02/25/2016 for CABG x 4 by Dr. Roxan Hockey.   He had bradycardia and I stopped his beta blocker.  A Holter demonstrated sinus with Mobitz Type 1.  He did have brief runs of 2:1 block without symptoms.  There were no sustained pauses.    Since I last saw him he called because he had blood pressure was creeping up.  He had his amlodipine increased from 5 to 10 mg but this did not seem to make a difference.  He then had a half of a 25 mg hydrochlorothiazide added by Copland, Gay Filler, MD  and his blood pressure improved dramatically he said after this but over the last few days it started to creep up again.  He is not describing any other change other than the fact that he gets a headache or a sensation that his neck is getting when his blood pressure goes up.  He denies any new symptoms such as chest pressure, neck  or arm discomfort.  He has had no new shortness of breath, PND or orthopnea.  He has had no weight gain or edema.    Past Medical History:  Diagnosis Date   Arthritis    BPH (benign prostatic hyperplasia)    Bunion    Callus    Carotid artery occlusion    Coronary artery disease    left main 60% stenosis. The LAD had a proximal 60-70% stenosis. There was mid 40% stenosis. Diagonal is moderate size with 40% stenosis. The circumflex system included a large OM with 60% ostial stenosis the RCA was occluded before the PDA. The EF was 65%.  2014   Glaucoma    beginning of glaucoma- uses drops    Hyperlipidemia    Hypertension    Myocardial infarct ([place]) 1992   inferior posterior- discovered in 1992-? when happened    Obesity    Radiculopathy    Shortness of breath dyspnea    with exertion -     Past Surgical History:  Procedure Laterality Date   [place]  05/2004   SHOWING 100% OCCLUDED DISTAL RIGHT CORONARY WITH  LEFT  TO RIGHT COLLATERALS, AS WELL AS ANTEGRADE FLOW, NORMAL LEFT MAIN, 50% NARROWING IN LEFT CIRCUMFLEX  WITH IRREGULARITIES IN THE LAD   CARDIAC CATHETERIZATION N/A  02/12/2016   Procedure: Left Heart Cath and Coronary Angiography;  Surgeon: Leonie Man, MD;  Location: [facility];  Service: Cardiovascular;  Laterality: N/A;   CARDIOVASCULAR STRESS TEST  02/26/2010   EF 65%, SMALL AREA OF MILD INFARCT AND POSSIBLE PER-INFARCT ISCHEMIA IN THE INFEROBASAL WALL   COLONOSCOPY     CORONARY ANGIOPLASTY     RIGHT CORONARY WERE UNSUCCESSFUL   CORONARY ARTERY BYPASS GRAFT N/A 02/25/2016   Procedure: CORONARY ARTERY BYPASS GRAFTING (CABG) times four using the left internal mammary artery and the right saphenus vein ;  Surgeon: [name], MD;  Location: [facility];  Service: Open Heart Surgery;  Laterality: N/A;   HERNIA REPAIR  1960   right hernia repair   INTRAOPERATIVE TRANSESOPHAGEAL ECHOCARDIOGRAM N/A 02/25/2016   Procedure:  INTRAOPERATIVE TRANSESOPHAGEAL ECHOCARDIOGRAM;  Surgeon: [name], MD;  Location: [place];  Service: Open Heart Surgery;  Laterality: N/A;   UMBILICAL HERNIA REPAIR     US ECHOCARDIOGRAPHY  01/03/2007   EF 55-60%     Current Outpatient Medications  Medication Sig Dispense Refill   amLODipine (NORVASC) 10 MG tablet Take 10 mg by mouth daily.     aspirin 81 MG tablet Take 81 mg by mouth daily.     Coenzyme Q10 100 MG TABS Take 100 mg by mouth daily.      dicyclomine (BENTYL) 10 MG capsule Take 1 pill by mouth every am. May take additional 3 times daily prn 50 capsule 3   ezetimibe (ZETIA) 10 MG tablet TAKE ONE TABLET BY MOUTH DAILY 90 tablet 3   fish oil-omega-3 fatty acids 1000 MG capsule Take 1,400 mg by mouth daily.     gabapentin (NEURONTIN) 300 MG capsule TAKE ONE CAPSULE BY MOUTH TWICE A DAY 180 capsule 1   Glucosamine-Chondroitin-MSM 500-200-150 MG TABS Take by mouth.     hydrochlorothiazide (MICROZIDE) 12.5 MG capsule Take 1 capsule (12.5 mg total) by mouth daily. 30 capsule 3   latanoprost (XALATAN) 0.005 % ophthalmic solution Place 1 drop into both eyes daily.     losartan (COZAAR) 100 MG tablet TAKE ONE TABLET BY MOUTH DAILY 90 tablet 3   [START ON 08/18/2021] moxifloxacin (VIGAMOX) 0.5 % ophthalmic solution      Multiple Vitamins-Minerals (MULTIVITAMIN WITH MINERALS) tablet Take 1 tablet by mouth daily.     Plant Sterols and Stanols 450 MG TABS Take 900 mg by mouth daily.     [START ON 08/18/2021] prednisoLONE acetate (PRED FORTE) 1 % ophthalmic suspension      rosuvastatin (CRESTOR) 10 MG tablet Take 1 tablet (10 mg total) by mouth every other day. 45 tablet 3   No current facility-administered medications for this visit.    Allergies:   Patient has no known allergies.    ROS:  Please see the history of present illness.   Otherwise, review of systems are positive for none.   All other systems are reviewed and negative.    PHYSICAL EXAM: VS:  BP (!) 120/54     Pulse 69    Ht 5\' 2"  (1.575 m)    Wt 180 lb 12.8 oz (82 kg)    SpO2 96%    BMI 33.07 kg/m  , BMI Body mass index is 33.07 kg/m. GENERAL:  Well appearing NECK:  No jugular venous distention, waveform within normal limits, carotid upstroke brisk and symmetric, no bruits, no thyromegaly LUNGS:  Clear to auscultation bilaterally CHEST:  Unremarkable HEART:  PMI not displaced or sustained,S1  and S2 within normal limits, no S3, no S4, no clicks, no rubs, no murmurs ABD:  Flat, positive bowel sounds normal in frequency in pitch, no bruits, no rebound, no guarding, no midline pulsatile mass, no hepatomegaly, no splenomegaly EXT:  2 plus pulses throughout, no edema, no cyanosis no clubbing   EKG:  EKG is not ordered today.    Recent Labs: 05/17/2021: ALT 19; BUN 15; Creatinine, Ser 1.00; Hemoglobin 16.1; Platelets 169.0; Potassium 4.0; Sodium 138    Lipid Panel    Component Value Date/Time   CHOL 178 11/26/2020 0906   TRIG 105.0 11/26/2020 0906   HDL 61.40 11/26/2020 0906   CHOLHDL 3 11/26/2020 0906   VLDL 21.0 11/26/2020 0906   LDLCALC 95 11/26/2020 0906      Wt Readings from Last 3 Encounters:  08/16/21 180 lb 12.8 oz (82 kg)  07/28/21 181 lb 12.8 oz (82.5 kg)  07/20/21 181 lb 8 oz (82.3 kg)      Other studies Reviewed: Additional studies/ records that were reviewed today include: Labs Review of the above records demonstrates:  Please see elsewhere in the note.     ASSESSMENT AND PLAN:    CAD s/p CABG:     The patient has no new sypmtoms.  No further cardiovascular testing is indicated.     HTN:   The blood pressure is at target today.  If his blood pressure starts creeping up I will increase to a whole HCTZ 25 mg daily.  I will get a basic metabolic profile today for baseline.   HYPERLIPIDEMIA:     LDL is 95 but he does not tolerate more Crestor.   CAROTID STENOSIS:    He had mild plaque last month.  No follow up is indicated.     Current medicines are reviewed at  length with the patient today.  The patient does not have concerns regarding medicines.  The following changes have been made:   None  Labs/ tests ordered today include:    Orders Placed This Encounter  Procedures   Basic metabolic panel      Disposition:   FU with me or APP in six months    Signed, Minus Breeding, MD  08/16/2021 12:04 PM    [place]

## 2021-08-16 ENCOUNTER — Other Ambulatory Visit: Payer: Self-pay

## 2021-08-16 ENCOUNTER — Encounter: Payer: Self-pay | Admitting: Cardiology

## 2021-08-16 ENCOUNTER — Ambulatory Visit: Payer: PPO | Admitting: Cardiology

## 2021-08-16 VITALS — BP 120/54 | HR 69 | Ht 62.0 in | Wt 180.8 lb

## 2021-08-16 DIAGNOSIS — I10 Essential (primary) hypertension: Secondary | ICD-10-CM

## 2021-08-16 DIAGNOSIS — I2510 Atherosclerotic heart disease of native coronary artery without angina pectoris: Secondary | ICD-10-CM

## 2021-08-16 NOTE — Patient Instructions (Signed)
Medication Instructions:  Your Physician recommend you continue on your current medication as directed.    *If you need a refill on your cardiac medications before your next appointment, please call your pharmacy*   Lab Work: Today (BMET) If you have labs (blood work) drawn today and your tests are completely normal, you will receive your results only by: [place] (if you have MyChart) OR A paper copy in the mail If you have any lab test that is abnormal or we need to change your treatment, we will call you to review the results.   Follow-Up: At [facility], you and your health needs are our priority.  As part of our continuing mission to provide you with exceptional heart care, we have created designated Provider Care Teams.  These Care Teams include your primary Cardiologist (physician) and Advanced Practice Providers (APPs -  Physician Assistants and Nurse Practitioners) who all work together to provide you with the care you need, when you need it.  We recommend signing up for the patient portal called "MyChart".  Sign up information is provided on this After Visit Summary.  MyChart is used to connect with patients for Virtual Visits (Telemedicine).  Patients are able to view lab/test results, encounter notes, upcoming appointments, etc.  Non-urgent messages can be sent to your provider as well.   To learn more about what you can do with MyChart, go to [email or web address].    Your next appointment:   6 month(s)  The format for your next appointment:   In Person  Provider:   Either Dr. Percival Spanish or an APP

## 2021-08-17 LAB — BASIC METABOLIC PANEL
BUN/Creatinine Ratio: 21 (ref 10–24)
BUN: 23 mg/dL (ref 8–27)
CO2: 25 mmol/L (ref 20–29)
Calcium: 9.5 mg/dL (ref 8.6–10.2)
Chloride: 96 mmol/L (ref 96–106)
Creatinine, Ser: 1.08 mg/dL (ref 0.76–1.27)
Glucose: 110 mg/dL — ABNORMAL HIGH (ref 70–99)
Potassium: 3.7 mmol/L (ref 3.5–5.2)
Sodium: 136 mmol/L (ref 134–144)
eGFR: 69 mL/min/{1.73_m2} (ref >59)

## 2021-08-24 ENCOUNTER — Encounter: Payer: Self-pay | Admitting: Family Medicine

## 2021-08-25 ENCOUNTER — Encounter: Payer: Self-pay | Admitting: Cardiology

## 2021-08-25 MED ORDER — AMLODIPINE BESYLATE 10 MG PO TABS: 10.0000 mg | ORAL_TABLET | Freq: Every day | ORAL | 1 refills | Status: DC

## 2021-08-31 DIAGNOSIS — H25811 Combined forms of age-related cataract, right eye: Secondary | ICD-10-CM | POA: Diagnosis not present

## 2021-08-31 DIAGNOSIS — H2511 Age-related nuclear cataract, right eye: Secondary | ICD-10-CM | POA: Diagnosis not present

## 2021-09-08 DIAGNOSIS — H25812 Combined forms of age-related cataract, left eye: Secondary | ICD-10-CM | POA: Diagnosis not present

## 2021-09-10 DIAGNOSIS — M1711 Unilateral primary osteoarthritis, right knee: Secondary | ICD-10-CM | POA: Diagnosis not present

## 2021-09-12 ENCOUNTER — Other Ambulatory Visit: Payer: Self-pay | Admitting: Gastroenterology

## 2021-09-13 ENCOUNTER — Telehealth: Payer: Self-pay | Admitting: Gastroenterology

## 2021-09-13 NOTE — Telephone Encounter (Signed)
Patient called stating that he needs a refill for Bentyl, but the pharmacy declined the refill. Seeking advice why. Please advise.

## 2021-09-13 NOTE — Telephone Encounter (Signed)
Called patient to advise  °

## 2021-09-13 NOTE — Telephone Encounter (Signed)
Per Dr. Tarri Glenn, unable to refill without current appt. Must be seen at least annually in order to refill Rx. LOV 2021 and procedure 08/2020. Can discuss refill at scheduled appt.

## 2021-09-13 NOTE — Telephone Encounter (Signed)
Next Appt With Gastroenterology Janett Billow D. Zehr, PA-C) 09/15/2021 at 8:30 AM

## 2021-09-14 ENCOUNTER — Ambulatory Visit: Payer: PPO | Admitting: Cardiology

## 2021-09-15 ENCOUNTER — Encounter: Payer: Self-pay | Admitting: Gastroenterology

## 2021-09-15 ENCOUNTER — Telehealth: Payer: Self-pay | Admitting: Gastroenterology

## 2021-09-15 ENCOUNTER — Telehealth (INDEPENDENT_AMBULATORY_CARE_PROVIDER_SITE_OTHER): Payer: PPO | Admitting: Gastroenterology

## 2021-09-15 DIAGNOSIS — R1012 Left upper quadrant pain: Secondary | ICD-10-CM | POA: Diagnosis not present

## 2021-09-15 DIAGNOSIS — R110 Nausea: Secondary | ICD-10-CM | POA: Diagnosis not present

## 2021-09-15 MED ORDER — DICYCLOMINE HCL 10 MG PO CAPS
10.0000 mg | ORAL_CAPSULE | Freq: Every morning | ORAL | 3 refills | Status: DC
  Filled 2022-08-30: qty 50, 17d supply, fill #0

## 2021-09-15 NOTE — Patient Instructions (Signed)
Continue dicyclomine as you are currently taking it.  Follow-up in about a year for further refills or sooner if needed.

## 2021-09-15 NOTE — Progress Notes (Signed)
Reviewed and agree with management plans. ? ?Mylin Hirano L. Blessed Girdner, MD, MPH  ?

## 2021-09-15 NOTE — Telephone Encounter (Signed)
[name], bady are scheduled for a virtual visit with your provider today.    Just as we do with appointments in the office, we must obtain your consent to participate.  Your consent will be active for this visit and any virtual visit you may have with one of our providers in the next 365 days.    If you have a MyChart account, I can also send a copy of this consent to you electronically.  All virtual visits are billed to your insurance company just like a traditional visit in the office.  As this is a virtual visit, video technology does not allow for your provider to perform a traditional examination.  This may limit your provider's ability to fully assess your condition.  If your provider identifies any concerns that need to be evaluated in person or the need to arrange testing such as labs, EKG, etc, we will make arrangements to do so.    Although advances in technology are sophisticated, we cannot ensure that it will always work on either your end or our end.  If the connection with a video visit is poor, we may have to switch to a telephone visit.  With either a video or telephone visit, we are not always able to ensure that we have a secure connection.   I need to obtain your verbal consent now.   Are you willing to proceed with your visit today?   Telly Jawad has provided verbal consent on 09/15/2021 for a virtual visit (video or telephone).   Laban Emperor. Jazzlyn Huizenga, PA-C 09/15/2021  9:07 AM .

## 2021-09-15 NOTE — Progress Notes (Addendum)
09/15/2021 Curtis Tyler [id number] [birth year]  I connected with Thamas Jaegers on September 15, 2021.  At 8:30 am by video enabled telemedicine application and verified that I am speaking with the correct person.  The patient and the provider were at separate locations during the entire encounter.  I discussed the limitations of evaluation and management by telemedicine and the availability of in person appointments.  Patient expressed understanding and agreed to proceed.  The patient and the provider were at different locations during the entire visit.  The patient was at home and the provider was at [facility] gastroenterology office.  HISTORY OF PRESENT ILLNESS: This is a pleasant 81 year old male who is a patient of Dr. Tarri Glenn.  His appointment today is for medication refill of his dicyclomine.  He has been taking that for quite some time on an as-needed basis to treat episodic nausea and left-sided abdominal pain.  He had undergone extensive evaluation that proved to be unrevealing.  He says that he does not even use it daily.  He says that he will take it once and then sometimes not need it for 5 or 6 days.  It works well.  He says that he seems to get more symptoms if he gets upset.  He says that his appetite is good, no weight loss.  Moves his bowels like clockwork with no rectal bleeding.  Up-to-date with colonoscopy in January 2022, no further recall due to age.   Past Medical History:  Diagnosis Date   Arthritis    BPH (benign prostatic hyperplasia)    Bunion    Callus    Carotid artery occlusion    Coronary artery disease    left main 60% stenosis. The LAD had a proximal 60-70% stenosis. There was mid 40% stenosis. Diagonal is moderate size with 40% stenosis. The circumflex system included a large OM with 60% ostial stenosis the RCA was occluded before the PDA. The EF was 65%.  2014   Glaucoma    beginning of glaucoma- uses drops    Hyperlipidemia    Hypertension    Myocardial  infarct ([place]) 1992   inferior posterior- discovered in 1992-? when happened    Obesity    Radiculopathy    Shortness of breath dyspnea    with exertion -    Past Surgical History:  Procedure Laterality Date   [place]  05/2004   SHOWING 100% OCCLUDED DISTAL RIGHT CORONARY WITH  LEFT  TO RIGHT COLLATERALS, AS WELL AS ANTEGRADE FLOW, NORMAL LEFT MAIN, 50% NARROWING IN LEFT CIRCUMFLEX  WITH IRREGULARITIES IN THE LAD   CARDIAC CATHETERIZATION N/A 02/12/2016   Procedure: Left Heart Cath and Coronary Angiography;  Surgeon: Leonie Man, MD;  Location: [facility];  Service: Cardiovascular;  Laterality: N/A;   CARDIOVASCULAR STRESS TEST  02/26/2010   EF 65%, SMALL AREA OF MILD INFARCT AND POSSIBLE PER-INFARCT ISCHEMIA IN THE INFEROBASAL WALL   COLONOSCOPY     CORONARY ANGIOPLASTY     RIGHT CORONARY WERE UNSUCCESSFUL   CORONARY ARTERY BYPASS GRAFT N/A 02/25/2016   Procedure: CORONARY ARTERY BYPASS GRAFTING (CABG) times four using the left internal mammary artery and the right saphenus vein ;  Surgeon: [name], MD;  Location: [facility];  Service: Open Heart Surgery;  Laterality: N/A;   HERNIA REPAIR  1960   right hernia repair   INTRAOPERATIVE TRANSESOPHAGEAL ECHOCARDIOGRAM N/A 02/25/2016   Procedure: INTRAOPERATIVE TRANSESOPHAGEAL ECHOCARDIOGRAM;  Surgeon: Revonda Standard  Roxan Hockey, MD;  Location: [facility];  Service: Open Heart Surgery;  Laterality: N/A;   UMBILICAL HERNIA REPAIR     US ECHOCARDIOGRAPHY  01/03/2007   EF 55-60%    reports that he quit smoking about 40 years ago. His smoking use included cigarettes. He has a 25.00 pack-year smoking history. He has never used smokeless tobacco. He reports current alcohol use of about 8.0 standard drinks per week. He reports that he does not use drugs. family history includes Congestive Heart Failure in his mother; Diabetes in his mother; Stroke in his father. No Known Allergies    Outpatient Encounter  Medications as of 09/15/2021  Medication Sig   amLODipine (NORVASC) 10 MG tablet Take 1 tablet (10 mg total) by mouth daily.   aspirin 81 MG tablet Take 81 mg by mouth daily.   Coenzyme Q10 100 MG TABS Take 100 mg by mouth daily.    dicyclomine (BENTYL) 10 MG capsule Take 1 pill by mouth every am. May take additional 3 times daily prn   ezetimibe (ZETIA) 10 MG tablet TAKE ONE TABLET BY MOUTH DAILY   fish oil-omega-3 fatty acids 1000 MG capsule Take 1,400 mg by mouth daily.   gabapentin (NEURONTIN) 300 MG capsule TAKE ONE CAPSULE BY MOUTH TWICE A DAY   Glucosamine-Chondroitin-MSM 500-200-150 MG TABS Take by mouth.   hydrochlorothiazide (MICROZIDE) 12.5 MG capsule Take 1 capsule (12.5 mg total) by mouth daily.   latanoprost (XALATAN) 0.005 % ophthalmic solution Place 1 drop into both eyes daily.   losartan (COZAAR) 100 MG tablet TAKE ONE TABLET BY MOUTH DAILY   moxifloxacin (VIGAMOX) 0.5 % ophthalmic solution    Multiple Vitamins-Minerals (MULTIVITAMIN WITH MINERALS) tablet Take 1 tablet by mouth daily.   Plant Sterols and Stanols 450 MG TABS Take 900 mg by mouth daily.   prednisoLONE acetate (PRED FORTE) 1 % ophthalmic suspension    rosuvastatin (CRESTOR) 10 MG tablet Take 1 tablet (10 mg total) by mouth every other day.   [DISCONTINUED] dicyclomine (BENTYL) 10 MG capsule Take 1 pill by mouth every am. May take additional 3 times daily prn   No facility-administered encounter medications on file as of 09/15/2021.     REVIEW OF SYSTEMS  : All other systems reviewed and negative except where noted in the History of Present Illness.   PHYSICAL EXAM: There were no vitals taken for this visit. General: Well developed white male in no acute distress; A&O x 3.  ASSESSMENT AND PLAN: *Intermittent nausea and left-sided abdominal pain: Extensive evaluation has proved unrevealing.  Reports that sometimes it worsens if he gets upset.  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  Controlled very well with dicyclomine  10 mg as needed, not even requiring it daily.  Needs prescription refilled.  We will send that to his pharmacy.  He will follow-up in a year for further refills or sooner if needed for any other GI issues.  CC:  Copland, Gay Filler, MD

## 2021-09-16 ENCOUNTER — Encounter: Payer: Self-pay | Admitting: Family Medicine

## 2021-09-16 DIAGNOSIS — R413 Other amnesia: Secondary | ICD-10-CM

## 2021-09-21 DIAGNOSIS — H2512 Age-related nuclear cataract, left eye: Secondary | ICD-10-CM | POA: Diagnosis not present

## 2021-09-21 DIAGNOSIS — H25812 Combined forms of age-related cataract, left eye: Secondary | ICD-10-CM | POA: Diagnosis not present

## 2021-09-30 ENCOUNTER — Encounter: Payer: Self-pay | Admitting: Family Medicine

## 2021-10-06 ENCOUNTER — Other Ambulatory Visit: Payer: Self-pay | Admitting: Family Medicine

## 2021-10-06 DIAGNOSIS — I10 Essential (primary) hypertension: Secondary | ICD-10-CM

## 2021-10-08 ENCOUNTER — Encounter: Payer: Self-pay | Admitting: Family Medicine

## 2021-10-08 ENCOUNTER — Telehealth: Payer: Self-pay | Admitting: Family Medicine

## 2021-10-08 DIAGNOSIS — I10 Essential (primary) hypertension: Secondary | ICD-10-CM

## 2021-10-08 DIAGNOSIS — Z5181 Encounter for therapeutic drug level monitoring: Secondary | ICD-10-CM

## 2021-10-08 MED ORDER — HYDROCHLOROTHIAZIDE 25 MG PO TABS: 25.0000 mg | ORAL_TABLET | Freq: Every day | ORAL | 3 refills | Status: DC

## 2021-10-08 NOTE — Telephone Encounter (Signed)
Pt is referring to HCTZ 12.5- do you think this is safe to do?

## 2021-10-08 NOTE — Telephone Encounter (Signed)
Pt stated that the pharmacy would not refill medication that was prescribed yesterday due to refilled to soon. Pt would like for copland to up the dosage to 25 MG or twice daily either or.   Please advise.

## 2021-10-25 DIAGNOSIS — H524 Presbyopia: Secondary | ICD-10-CM | POA: Diagnosis not present

## 2021-10-25 DIAGNOSIS — H5201 Hypermetropia, right eye: Secondary | ICD-10-CM | POA: Diagnosis not present

## 2021-10-25 DIAGNOSIS — H52203 Unspecified astigmatism, bilateral: Secondary | ICD-10-CM | POA: Diagnosis not present

## 2021-10-25 DIAGNOSIS — H5212 Myopia, left eye: Secondary | ICD-10-CM | POA: Diagnosis not present

## 2021-10-26 ENCOUNTER — Other Ambulatory Visit: Payer: Self-pay | Admitting: Family Medicine

## 2021-11-18 DIAGNOSIS — M21621 Bunionette of right foot: Secondary | ICD-10-CM | POA: Diagnosis not present

## 2021-11-18 DIAGNOSIS — M79672 Pain in left foot: Secondary | ICD-10-CM | POA: Diagnosis not present

## 2021-11-18 DIAGNOSIS — M19079 Primary osteoarthritis, unspecified ankle and foot: Secondary | ICD-10-CM | POA: Diagnosis not present

## 2021-11-18 DIAGNOSIS — L851 Acquired keratosis [keratoderma] palmaris et plantaris: Secondary | ICD-10-CM | POA: Diagnosis not present

## 2021-12-09 DIAGNOSIS — M170 Bilateral primary osteoarthritis of knee: Secondary | ICD-10-CM | POA: Diagnosis not present

## 2021-12-23 NOTE — Patient Instructions (Addendum)
It was good to see you again today, I will be in touch with your lab results ?Assuming all is well please see me in about 6 months- however, please let me know if your vertigo and headaches do not go away as expected  ?We will set you up for the 24 hour blood pressure monitor- let me know if you don't hear about this test in the next few days and I will chase it down for you  ? ?Per latest recommendations, it looks like you are due for an additional COVID-19 booster if not done in the last 4-6 months ? ? ?

## 2021-12-23 NOTE — Progress Notes (Signed)
Therapist, music at [facility] ?[address], [address] ?[place], [place] [address] ?336 [phone] ?Fax [phone] ? ?Date:  12/29/2021  ? ?Name:  Curtis Tyler   DOB:  1941-08-26   MRN:  [id number] ? ?PCP:  [name], MD  ? ? ?Chief Complaint: Follow-up, Headache (More in the past week), and Dizziness ? ? ?History of Present Illness: ? ?Curtis Tyler is a 81 y.o. very pleasant male patient who presents with the following: ? ?Patient seen today for periodic follow-up-history of prediabetes, coronary artery disease status post CABG, hyperlipidemia, hypertension, carotid stenosis, BPH ?Most recent visit with myself was in November ? ?Dr. Percival Spanish is his cardiologist, most recent visit in December-at that time he was doing well.  Continue current blood pressure medications.  Carotid ultrasound is up-to-date, nothing further needed here ? ?Immunizations are up-to-date, but can suggest 1 additional COVID-19 booster ?Can update lab work today-need to recheck PSA.  There are some upward trend we last checked in September ? ?Curtis Tyler has noted some mild HA recently, he may get some mild vertigo and take some dramamine on occasion-he has noted a symptoms for 1 to 2 weeks.  He notes a few episodes of vertigo over his lifetime, he had a much more severe attack when he was about 38 ?The vertigo may last 30 minutes or so and then resolve, it is not positional in particular ?No new tenderness ?He notes his BP has been up and down at home ?He checks his BP when he feels like something is off - he feels bad if his BP is too high ?No CP or SOB ?He might take tylenol for HA but not always needed  ? ?He notes HA off and on over the years  ?He saw neurology and had a brain MRI in 2015 - normal  ? ?He is seeing urology- Dr Diona Fanti- pt would like me to check a PSA for him today as well ? ? ? ?BP Readings from Last 3 Encounters:  ?12/29/21 132/70  ?08/16/21 (!) 120/54  ?07/28/21 132/72  ? ? ?Patient Active Problem List   ? Diagnosis Date Noted  ? LUQ pain 09/15/2021  ? Nausea without vomiting 09/15/2021  ? Educated about COVID-19 virus infection 05/11/2020  ? Lung nodule 04/10/2020  ? Elevated bilirubin 05/21/2019  ? Pre-diabetes 05/20/2019  ? Dyslipidemia 05/03/2019  ? Bradycardia 09/12/2018  ? Plantar porokeratosis, acquired 05/16/2018  ? Abnormal stress ECG with treadmill 02/12/2016  ? Colonic polyp 09/21/2015  ? Left lower quadrant pain 09/03/2015  ? Cellulitis of right lower extremity 05/14/2015  ? Need for immunization against influenza 05/14/2015  ? Bilateral low back pain with sciatica 09/03/2014  ? Hip pain, bilateral 09/03/2014  ? Bilateral carotid artery stenosis 07/21/2014  ? Myalgia 07/21/2014  ? Acute pain of left knee 07/15/2014  ? Benign prostatic hyperplasia with lower urinary tract symptoms 07/15/2014  ? Effusion of left knee 07/15/2014  ? History of elevated prostate specific antigen (PSA) 07/15/2014  ? Hypertrophy of nasal turbinates 07/15/2014  ? 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 Incomplete emptying of bladder 07/15/2014  ? Increased urinary frequency 07/15/2014  ? 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  ? Chest pain 09/05/2012  ? Cough 08/06/2011  ? Coronary artery disease involving native coronary artery without angina pectoris 04/26/2011  ? Essential hypertension 04/26/2011  ? Hyperlipidemia with target LDL less than 70 04/26/2011  ? Cerebrovascular disease 04/26/2011  ? ? ?Past Medical History:  ?Diagnosis Date  ? Arthritis   ?  BPH (benign prostatic hyperplasia)   ? Bunion   ? Callus   ? Carotid artery occlusion   ? Coronary artery disease   ? left main 60% stenosis. The LAD had a proximal 60-70% stenosis. There was mid 40% stenosis. Diagonal is moderate size with 40% stenosis. The circumflex system included a large OM with 60% ostial stenosis the RCA was occluded before the PDA. The EF was 65%.  2014  ? Glaucoma   ? beginning of glaucoma- uses drops   ? Hyperlipidemia   ? Hypertension   ? Myocardial  infarct [facility]) 1992  ? inferior posterior- discovered in 1992-? when happened   ? Obesity   ? Radiculopathy   ? Shortness of breath dyspnea   ? with exertion -   ? ? ?Past Surgical History:  ?Procedure Laterality Date  ? APPENDECTOMY  1958  ? CARDIAC CATHETERIZATION  05/2004  ? SHOWING 100% OCCLUDED DISTAL RIGHT CORONARY WITH  LEFT  TO RIGHT COLLATERALS, AS WELL AS ANTEGRADE FLOW, NORMAL LEFT MAIN, 50% NARROWING IN LEFT CIRCUMFLEX  WITH IRREGULARITIES IN THE LAD  ? CARDIAC CATHETERIZATION N/A 02/12/2016  ? Procedure: Left Heart Cath and Coronary Angiography;  Surgeon: Leonie Man, MD;  Location: [place] [facility];  Service: Cardiovascular;  Laterality: N/A;  ? CARDIOVASCULAR STRESS TEST  02/26/2010  ? EF 65%, SMALL AREA OF MILD INFARCT AND POSSIBLE PER-INFARCT ISCHEMIA IN THE INFEROBASAL WALL  ? COLONOSCOPY    ? CORONARY ANGIOPLASTY    ? RIGHT CORONARY WERE UNSUCCESSFUL  ? CORONARY ARTERY BYPASS GRAFT N/A 02/25/2016  ? Procedure: CORONARY ARTERY BYPASS GRAFTING (CABG) times four using the left internal mammary artery and the right saphenus vein ;  Surgeon: [name], MD;  Location: [place];  Service: Open Heart Surgery;  Laterality: N/A;  ? HERNIA REPAIR  1960  ? right hernia repair  ? INTRAOPERATIVE TRANSESOPHAGEAL ECHOCARDIOGRAM N/A 02/25/2016  ? Procedure: INTRAOPERATIVE TRANSESOPHAGEAL ECHOCARDIOGRAM;  Surgeon: [name], MD;  Location: [facility];  Service: Open Heart Surgery;  Laterality: N/A;  ? UMBILICAL HERNIA REPAIR    ? US ECHOCARDIOGRAPHY  01/03/2007  ? EF 55-60%  ? ? ?Social History  ? ?Tobacco Use  ? Smoking status: Former  ?  Packs/day: 1.00  ?  Years: 25.00  ?  Pack years: 25.00  ?  Types: Cigarettes  ?  Quit date: 07/23/1981  ?  Years since quitting: 40.4  ? Smokeless tobacco: Never  ?Vaping Use  ? Vaping Use: Never used  ?Substance Use Topics  ? Alcohol use: Yes  ?  Alcohol/week: 8.0 standard drinks  ?  Types: 7 Glasses of wine, 1 Cans of beer per week  ?  Comment: glass of wine a night    ? Drug use: No  ? ? ?Family History  ?Problem Relation Age of Onset  ? Stroke Father   ? Congestive Heart Failure Mother   ? Diabetes Mother   ? Hypertension Neg Hx   ? Colon cancer Neg Hx   ? Colon polyps Neg Hx   ? Esophageal cancer Neg Hx   ? Rectal cancer Neg Hx   ? Stomach cancer Neg Hx   ? ? ?No Known Allergies ? ?Medication list has been reviewed and updated. ? ?Current Outpatient Medications on File Prior to Visit  ?Medication Sig Dispense Refill  ? amLODipine (NORVASC) 10 MG tablet Take 1 tablet (10 mg total) by mouth daily. 90 tablet 1  ? aspirin 81 MG tablet Take 81  mg by mouth daily.    ? Coenzyme Q10 100 MG TABS Take 100 mg by mouth daily.     ? dicyclomine (BENTYL) 10 MG capsule Take 1 pill by mouth every am. May take additional 3 times daily prn 50 capsule 3  ? ezetimibe (ZETIA) 10 MG tablet TAKE ONE TABLET BY MOUTH DAILY 90 tablet 3  ? fish oil-omega-3 fatty acids 1000 MG capsule Take 1,400 mg by mouth daily.    ? gabapentin (NEURONTIN) 300 MG capsule TAKE ONE CAPSULE BY MOUTH TWICE A DAY 180 capsule 1  ? Glucosamine-Chondroitin-MSM 500-200-150 MG TABS Take by mouth.    ? hydrochlorothiazide (HYDRODIURIL) 25 MG tablet Take 1 tablet (25 mg total) by mouth daily. 90 tablet 3  ? latanoprost (XALATAN) 0.005 % ophthalmic solution Place 1 drop into both eyes daily.    ? losartan (COZAAR) 100 MG tablet TAKE ONE TABLET BY MOUTH DAILY 90 tablet 3  ? Multiple Vitamins-Minerals (MULTIVITAMIN WITH MINERALS) tablet Take 1 tablet by mouth daily.    ? Plant Sterols and Stanols 450 MG TABS Take 900 mg by mouth daily.    ? rosuvastatin (CRESTOR) 10 MG tablet Take 1 tablet (10 mg total) by mouth every other day. 45 tablet 3  ? ?No current facility-administered medications on file prior to visit.  ? ? ?Review of Systems: ? ?As per HPI- otherwise negative. ? ? ?Physical Examination: ?Vitals:  ? 12/29/21 0921  ?BP: 132/70  ?Pulse: 66  ?Resp: 20  ?SpO2: 98%  ? ?Vitals:  ? 12/29/21 0921  ?Weight: 177 lb (80.3 kg)   ?Height: '5\' 2"'$  (1.575 m)  ? ?Body mass index is 32.37 kg/m?. ?Ideal Body Weight: Weight in (lb) to have BMI = 25: 136.4 ? ?GEN: no acute distress.  Obese, looks well  ?HEENT: Atraumatic, Normocephalic.   Concepcion Living

## 2021-12-24 ENCOUNTER — Other Ambulatory Visit (INDEPENDENT_AMBULATORY_CARE_PROVIDER_SITE_OTHER): Payer: PPO

## 2021-12-24 DIAGNOSIS — Z5181 Encounter for therapeutic drug level monitoring: Secondary | ICD-10-CM

## 2021-12-24 DIAGNOSIS — I10 Essential (primary) hypertension: Secondary | ICD-10-CM | POA: Diagnosis not present

## 2021-12-24 LAB — BASIC METABOLIC PANEL
BUN: 17 mg/dL (ref 6–23)
CO2: 27 mEq/L (ref 19–32)
Calcium: 8.8 mg/dL (ref 8.4–10.5)
Chloride: 102 mEq/L (ref 96–112)
Creatinine, Ser: 0.97 mg/dL (ref 0.40–1.50)
GFR: 72.33 mL/min (ref >60.00)
Glucose, Bld: 127 mg/dL — ABNORMAL HIGH (ref 70–99)
Potassium: 4.2 mEq/L (ref 3.5–5.1)
Sodium: 142 mEq/L (ref 135–145)

## 2021-12-26 ENCOUNTER — Encounter: Payer: Self-pay | Admitting: Family Medicine

## 2021-12-27 DIAGNOSIS — H401131 Primary open-angle glaucoma, bilateral, mild stage: Secondary | ICD-10-CM | POA: Diagnosis not present

## 2021-12-29 ENCOUNTER — Encounter: Payer: Self-pay | Admitting: Family Medicine

## 2021-12-29 ENCOUNTER — Ambulatory Visit (INDEPENDENT_AMBULATORY_CARE_PROVIDER_SITE_OTHER): Payer: PPO | Admitting: Family Medicine

## 2021-12-29 VITALS — BP 132/70 | HR 66 | Resp 20 | Ht 62.0 in | Wt 177.0 lb

## 2021-12-29 DIAGNOSIS — R42 Dizziness and giddiness: Secondary | ICD-10-CM | POA: Diagnosis not present

## 2021-12-29 DIAGNOSIS — Z87898 Personal history of other specified conditions: Secondary | ICD-10-CM

## 2021-12-29 DIAGNOSIS — E785 Hyperlipidemia, unspecified: Secondary | ICD-10-CM

## 2021-12-29 DIAGNOSIS — R7303 Prediabetes: Secondary | ICD-10-CM | POA: Diagnosis not present

## 2021-12-29 DIAGNOSIS — I10 Essential (primary) hypertension: Secondary | ICD-10-CM

## 2021-12-29 DIAGNOSIS — R6889 Other general symptoms and signs: Secondary | ICD-10-CM

## 2021-12-29 LAB — LIPID PANEL
Cholesterol: 153 mg/dL (ref 0–200)
HDL: 66.5 mg/dL (ref >39.00)
LDL Cholesterol: 69 mg/dL (ref 0–99)
NonHDL: 86.54
Total CHOL/HDL Ratio: 2
Triglycerides: 86 mg/dL (ref 0.0–149.0)
VLDL: 17.2 mg/dL (ref 0.0–40.0)

## 2021-12-29 LAB — COMPREHENSIVE METABOLIC PANEL
ALT: 26 U/L (ref 0–53)
AST: 26 U/L (ref 0–37)
Albumin: 4.6 g/dL (ref 3.5–5.2)
Alkaline Phosphatase: 63 U/L (ref 39–117)
BUN: 28 mg/dL — ABNORMAL HIGH (ref 6–23)
CO2: 29 mEq/L (ref 19–32)
Calcium: 9.5 mg/dL (ref 8.4–10.5)
Chloride: 99 mEq/L (ref 96–112)
Creatinine, Ser: 1.11 mg/dL (ref 0.40–1.50)
GFR: 61.52 mL/min (ref >60.00)
Glucose, Bld: 120 mg/dL — ABNORMAL HIGH (ref 70–99)
Potassium: 3.9 mEq/L (ref 3.5–5.1)
Sodium: 137 mEq/L (ref 135–145)
Total Bilirubin: 1.1 mg/dL (ref 0.2–1.2)
Total Protein: 7.5 g/dL (ref 6.0–8.3)

## 2021-12-29 LAB — CBC
HCT: 48.9 % (ref 39.0–52.0)
Hemoglobin: 16.2 g/dL (ref 13.0–17.0)
MCHC: 33.2 g/dL (ref 30.0–36.0)
MCV: 95.7 fl (ref 78.0–100.0)
Platelets: 206 10*3/uL (ref 150.0–400.0)
RBC: 5.11 Mil/uL (ref 4.22–5.81)
RDW: 13.2 % (ref 11.5–15.5)
WBC: 5.9 10*3/uL (ref 4.0–10.5)

## 2021-12-29 LAB — PSA: PSA: 3.46 ng/mL (ref 0.10–4.00)

## 2021-12-29 LAB — HEMOGLOBIN A1C: Hgb A1c MFr Bld: 6.4 % (ref 4.6–6.5)

## 2021-12-29 LAB — TSH: TSH: 1.86 u[IU]/mL (ref 0.35–5.50)

## 2022-02-08 ENCOUNTER — Encounter: Payer: Self-pay | Admitting: Family Medicine

## 2022-02-08 DIAGNOSIS — R413 Other amnesia: Secondary | ICD-10-CM

## 2022-02-08 DIAGNOSIS — R42 Dizziness and giddiness: Secondary | ICD-10-CM

## 2022-02-17 ENCOUNTER — Ambulatory Visit (HOSPITAL_COMMUNITY)
Admission: RE | Admit: 2022-02-17 | Discharge: 2022-02-17 | Disposition: A | Discharge status: Home / Self Care | Payer: PPO | Source: Ambulatory Visit | Attending: Family Medicine | Admitting: Family Medicine

## 2022-02-17 DIAGNOSIS — R519 Headache, unspecified: Secondary | ICD-10-CM | POA: Diagnosis not present

## 2022-02-17 DIAGNOSIS — R413 Other amnesia: Secondary | ICD-10-CM | POA: Diagnosis not present

## 2022-02-17 DIAGNOSIS — R42 Dizziness and giddiness: Secondary | ICD-10-CM

## 2022-02-17 MED ORDER — GADOBUTROL 1 MMOL/ML IV SOLN
7.5000 mL | Freq: Once | INTRAVENOUS | Status: AC | PRN
  Administered 2022-02-17: 7.5 mL via INTRAVENOUS

## 2022-02-18 ENCOUNTER — Encounter: Payer: Self-pay | Admitting: Family Medicine

## 2022-02-20 NOTE — Progress Notes (Signed)
Cardiology Office Note   Date:  02/21/2022   ID:  Curtis Tyler, DOB 02/16/1941, MRN [id number]  PCP:  [name], MD  Cardiologist:   None   Chief Complaint  Patient presents with   Leg Pain       History of Present Illness: Curtis Tyler is a 81 y.o. male who presents for follow up of CAD.  He had a cardiac cath in 2014 demonstrating left main 60% stenosis. The LAD had a proximal 60-70% stenosis. There was mid 40% stenosis. Diagonal is moderate size with 40% stenosis. The circumflex system included a large OM with 60% ostial stenosis the RCA was occluded before the PDA. The EF was 65%. However, we decided to manage him medically unless he had worsening symptoms. In May 2017 I sent him for a screening POET (Plain Old Exercise Treadmill).  This was abnormal with ST depression and was a high risk study. He then underwent cardiac catheterization where he was found to have a 60% ostial left main, a 90% proximal LAD, 90% proximal circumflex, and a diffusely diseased right coronary with a chronically totally occluded posterior descending.  He was taken the operating room on 02/25/2016 for CABG x 4 by Dr. Dorris Fetch.   He had bradycardia and I stopped his beta blocker.  A Holter demonstrated sinus with Mobitz Type 1.  He did have brief runs of 2:1 block without symptoms.  There were no sustained pauses.    Since I last saw him he has done okay from a cardiac standpoint.  However, he is having more pain in his legs when he walks.  She is to cramping in his anterior pretibial area and somewhat behind his knees.  He has soreness in his feet to touch.  He is getting an injection in his knee but he says this is different from that.  He is not having any new swelling.  He denies any other cardiovascular symptoms such as chest discomfort, neck or arm discomfort.  He has had no new shortness of breath, PND or orthopnea.  He has had no weight gain or edema.  He is trying to walk to be active but he  seems to be limited because of the leg complaints.   Past Medical History:  Diagnosis Date   Arthritis    BPH (benign prostatic hyperplasia)    Bunion    Callus    Carotid artery occlusion    Coronary artery disease    left main 60% stenosis. The LAD had a proximal 60-70% stenosis. There was mid 40% stenosis. Diagonal is moderate size with 40% stenosis. The circumflex system included a large OM with 60% ostial stenosis the RCA was occluded before the PDA. The EF was 65%.  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 when happened    Obesity    Radiculopathy    Shortness of breath dyspnea    with exertion -     Past Surgical History:  Procedure Laterality Date   APPENDECTOMY  1958   CARDIAC CATHETERIZATION  05/2004   SHOWING 100% OCCLUDED DISTAL RIGHT CORONARY WITH  LEFT  TO RIGHT COLLATERALS, AS WELL AS ANTEGRADE FLOW, NORMAL LEFT MAIN, 50% NARROWING IN LEFT CIRCUMFLEX  WITH IRREGULARITIES IN THE LAD   CARDIAC CATHETERIZATION N/A 02/12/2016   Procedure: Left Heart Cath and Coronary Angiography;  Surgeon: Marykay Lex, MD;  Location: [facility];  Service: Cardiovascular;  Laterality: N/A;   CARDIOVASCULAR STRESS TEST  02/26/2010   EF 65%, SMALL AREA OF MILD INFARCT AND POSSIBLE PER-INFARCT ISCHEMIA IN THE INFEROBASAL WALL   COLONOSCOPY     CORONARY ANGIOPLASTY     RIGHT CORONARY WERE UNSUCCESSFUL   CORONARY ARTERY BYPASS GRAFT N/A 02/25/2016   Procedure: CORONARY ARTERY BYPASS GRAFTING (CABG) times four using the left internal mammary artery and the right saphenus vein ;  Surgeon: Loreli Slot, MD;  Location: [facility] OR;  Service: Open Heart Surgery;  Laterality: N/A;   HERNIA REPAIR  1960   right hernia repair   INTRAOPERATIVE TRANSESOPHAGEAL ECHOCARDIOGRAM N/A 02/25/2016   Procedure: INTRAOPERATIVE TRANSESOPHAGEAL ECHOCARDIOGRAM;  Surgeon: Loreli Slot, MD;  Location: [facility] OR;  Service: Open Heart Surgery;  Laterality: N/A;   UMBILICAL HERNIA REPAIR     US ECHOCARDIOGRAPHY  01/03/2007   EF 55-60%     Current Outpatient Medications  Medication Sig Dispense Refill   amLODipine (NORVASC) 10 MG tablet TAKE ONE TABLET BY MOUTH DAILY 90 tablet 1   aspirin 81 MG tablet Take 81 mg by mouth daily.     Coenzyme Q10 100 MG TABS Take 100 mg by mouth daily.      dicyclomine (BENTYL) 10 MG capsule Take 1 pill by mouth every am. May take additional 3 times daily prn 50 capsule 3   ezetimibe (ZETIA) 10 MG tablet TAKE ONE TABLET BY MOUTH DAILY 90 tablet 3   fish oil-omega-3 fatty acids 1000 MG capsule Take 1,400 mg by mouth daily.     gabapentin (NEURONTIN) 300 MG capsule TAKE ONE CAPSULE BY MOUTH TWICE A DAY 180 capsule 1   Glucosamine-Chondroitin-MSM 500-200-150 MG TABS Take by mouth.     hydrochlorothiazide (HYDRODIURIL) 25 MG tablet Take 1 tablet (25 mg total) by mouth daily. 90 tablet 3   latanoprost (XALATAN) 0.005 % ophthalmic solution Place 1 drop into both eyes daily.     losartan (COZAAR) 100 MG tablet TAKE ONE TABLET BY MOUTH DAILY 90 tablet 3   Multiple Vitamins-Minerals (MULTIVITAMIN WITH MINERALS) tablet Take 1 tablet by mouth daily.     Plant Sterols and Stanols 450 MG TABS Take 900 mg by mouth daily.     rosuvastatin (CRESTOR) 10 MG tablet Take 1 tablet (10 mg total) by mouth every other day. 45 tablet 3   No current facility-administered medications for this visit.    Allergies:   Patient has no known allergies.    ROS:  Please see the history of present illness.   Otherwise, review of systems are positive for vertigo and decreased memory.   All other systems are reviewed and negative.    PHYSICAL EXAM: VS:  BP 130/72   Pulse 65   Ht 5' 2.5" (1.588 m)   Wt 178 lb 6.4 oz (80.9 kg)   SpO2 96%   BMI 32.11 kg/m  , BMI Body mass index is 32.11 kg/m. GENERAL:  Well appearing NECK:  No jugular venous distention, waveform  within normal limits, carotid upstroke brisk and symmetric, no bruits, no thyromegaly LUNGS:  Clear to auscultation bilaterally CHEST:  Well healed sternotomy scar. HEART:  PMI not displaced or sustained,S1 and S2 within normal limits, no S3, no S4, no clicks, no rubs, no murmurs ABD:  Flat, positive bowel sounds normal in frequency in pitch, no bruits, no rebound, no guarding, no midline pulsatile mass, no hepatomegaly, no splenomegaly  EXT:  2 plus pulses throughout, no edema, no cyanosis no clubbing  EKG:  EKG is  ordered today. Sinus rhythm, rate 65, left axis deviation, first-degree AV block, RSR prime V1, no acute ST-T wave changes.   Recent Labs: 12/29/2021: ALT 26; BUN 28; Creatinine, Ser 1.11; Hemoglobin 16.2; Platelets 206.0; Potassium 3.9; Sodium 137; TSH 1.86    Lipid Panel    Component Value Date/Time   CHOL 153 12/29/2021 0949   TRIG 86.0 12/29/2021 0949   HDL 66.50 12/29/2021 0949   CHOLHDL 2 12/29/2021 0949   VLDL 17.2 12/29/2021 0949   LDLCALC 69 12/29/2021 0949      Wt Readings from Last 3 Encounters:  02/21/22 178 lb 6.4 oz (80.9 kg)  12/29/21 177 lb (80.3 kg)  08/16/21 180 lb 12.8 oz (82 kg)      Other studies Reviewed: Additional studies/ records that were reviewed today include: Labs Review of the above records demonstrates:  Please see elsewhere in the note.     ASSESSMENT AND PLAN:    CAD s/p CABG:    The patient has no new sypmtoms.  No further cardiovascular testing is indicated.  We will continue with aggressive risk reduction and meds as listed.   HTN:   The blood pressure is at target.  No change in therapy.   HYPERLIPIDEMIA:     LDL was 69 with an HDL of 66.  No change in therapy.   CAROTID STENOSIS:   This was mild on most recent imaging.  No further imaging.    LEG PAIN: He has excellent pulses.  I do not think this is vascular.  He might have neuropathy.  I have suggested benfotiamine.    Current medicines are reviewed at length with  the patient today.  The patient does not have concerns regarding medicines.  The following changes have been made:   As above  Labs/ tests ordered today include:  None  Orders Placed This Encounter  Procedures   EKG 12-Lead      Disposition:   FU with me in one year.    Signed, Rollene Rotunda, MD  02/21/2022 10:42 AM    [facility]

## 2022-02-21 ENCOUNTER — Other Ambulatory Visit: Payer: Self-pay | Admitting: Family Medicine

## 2022-02-21 ENCOUNTER — Encounter: Payer: Self-pay | Admitting: Cardiology

## 2022-02-21 ENCOUNTER — Ambulatory Visit: Payer: PPO | Admitting: Cardiology

## 2022-02-21 VITALS — BP 130/72 | HR 65 | Ht 62.5 in | Wt 178.4 lb

## 2022-02-21 DIAGNOSIS — E785 Hyperlipidemia, unspecified: Secondary | ICD-10-CM | POA: Diagnosis not present

## 2022-02-21 DIAGNOSIS — I10 Essential (primary) hypertension: Secondary | ICD-10-CM | POA: Diagnosis not present

## 2022-02-21 DIAGNOSIS — I2510 Atherosclerotic heart disease of native coronary artery without angina pectoris: Secondary | ICD-10-CM

## 2022-03-03 NOTE — Progress Notes (Addendum)
[facility] [place], [place], [place] [address] [phone]  Date:  03/10/2022   Name:  Curtis Tyler   DOB:  1940-12-11   MRN:  [id number]  PCP:  [name], MD    Chief Complaint: Leg Pain (Bilateral- has worsened. )   History of Present Illness:  Curtis Tyler is a 81 y.o. very pleasant male patient who presents with the following:  Patient seen today with concern about leg pain Most recent visit with myself was in May- -history of prediabetes, coronary artery disease status post CABG, hyperlipidemia, hypertension, carotid stenosis, BPH At our last visit Curtis Tyler had concern of intermittent vertigo, we ended up doing a brain MRI which was normal He also saw his cardiologist, Dr. Percival Spanish in the interim, appointment on June [place] noted that he had excellent leg pulses, wondered if he might have neuropathy causing leg pain  Notes from that visit: Since I last saw him he has done okay from a cardiac standpoint.  However, he is having more pain in his legs when he walks.  She is to cramping in his anterior pretibial area and somewhat behind his knees.  He has soreness in his feet to touch.  He is getting an injection in his knee but he says this is different from that.  He is not having any new swelling.  He denies any other cardiovascular symptoms such as chest discomfort, neck or arm discomfort.  He has had no new shortness of breath, PND or orthopnea.  He has had no weight gain or edema.  He is trying to walk to be active but he seems to be limited because of the leg complaints.  Today Curtis Tyler notes he has had leg pain for several years.  At some point he was taking lipitor thought it might be a reaction so he changed his statin.  Seemed better for a while  Then he develop nighttime aches in his lower legs and shins In 2016 he tried cutting his statin again and we added zetia He also started on the gabapentin in  2016 and things got better  However, about a month ago he went golfing - he noted it took him longer to recover as far as calf pain compared with typical pattern.  Typically he might feel sore for 1 to 2 days, this time he was sore for a week Dr Alba Cory suggested a dietary supplement which he tried but it did not seem to help  He does not seem to have claudication necessarily- more pain at the end of the day.  At this point it does not seem to matter how much he walks, he just has the pain.  He is actually stopped golfing due to this pain   Patient Active Problem List   Diagnosis Date Noted   LUQ pain 09/15/2021   Nausea without vomiting 09/15/2021   Educated about COVID-19 virus infection 05/11/2020   Lung nodule 04/10/2020   Elevated bilirubin 05/21/2019   Pre-diabetes 05/20/2019   Dyslipidemia 05/03/2019   Bradycardia 09/12/2018   Plantar porokeratosis, acquired 05/16/2018   Abnormal stress ECG with treadmill 02/12/2016   Colonic polyp 09/21/2015   Left lower quadrant pain 09/03/2015   Cellulitis of right lower extremity 05/14/2015   Need for immunization against influenza 05/14/2015   Bilateral low back pain with sciatica 09/03/2014   Hip pain, bilateral 09/03/2014   Bilateral carotid artery stenosis 07/21/2014  Myalgia 07/21/2014   Acute pain of left knee 07/15/2014   Benign prostatic hyperplasia with lower urinary tract symptoms 07/15/2014   Effusion of left knee 07/15/2014   History of elevated prostate specific antigen (PSA) 07/15/2014   Hypertrophy of nasal turbinates 07/15/2014   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 The LAD had a proximal 60-70% stenosis. There was mid 40% stenosis. Diagonal is moderate size with 40% stenosis. The circumflex system included a large OM with 60% ostial stenosis the RCA was occluded before the PDA. The EF was 65%.  2014   Glaucoma    beginning of glaucoma- uses drops    Hyperlipidemia    Hypertension    Myocardial infarct ([place]) 1992   inferior posterior- discovered in 1992-? when happened    Obesity    Radiculopathy    Shortness of breath dyspnea    with exertion -     Past Surgical History:  Procedure Laterality Date   [place]  05/2004   SHOWING 100% OCCLUDED DISTAL RIGHT CORONARY WITH  LEFT  TO RIGHT COLLATERALS, AS WELL AS ANTEGRADE FLOW, NORMAL LEFT MAIN, 50% NARROWING IN LEFT CIRCUMFLEX  WITH IRREGULARITIES IN THE LAD   CARDIAC CATHETERIZATION N/A 02/12/2016   Procedure: Left Heart Cath and Coronary Angiography;  Surgeon: Leonie Man, MD;  Location: [facility];  Service: Cardiovascular;  Laterality: N/A;   CARDIOVASCULAR STRESS TEST  02/26/2010   EF 65%, SMALL AREA OF MILD INFARCT AND POSSIBLE PER-INFARCT ISCHEMIA IN THE INFEROBASAL WALL   COLONOSCOPY     CORONARY ANGIOPLASTY     RIGHT CORONARY WERE UNSUCCESSFUL   CORONARY ARTERY BYPASS GRAFT N/A 02/25/2016   Procedure: CORONARY ARTERY BYPASS GRAFTING (CABG) times four using the left internal mammary artery and the right saphenus vein ;  Surgeon: [name], MD;  Location: [facility];  Service: Open Heart Surgery;  Laterality: N/A;   HERNIA REPAIR  1960   right hernia repair   INTRAOPERATIVE TRANSESOPHAGEAL ECHOCARDIOGRAM N/A 02/25/2016   Procedure: INTRAOPERATIVE TRANSESOPHAGEAL ECHOCARDIOGRAM;  Surgeon: [name],  MD;  Location: [facility];  Service: Open Heart Surgery;  Laterality: N/A;   UMBILICAL HERNIA REPAIR     US ECHOCARDIOGRAPHY  01/03/2007   EF 55-60%    Social History   Tobacco Use   Smoking status: Former    Packs/day: 1.00    Years: 25.00    Total pack years: 25.00    Types: Cigarettes    Quit date: 07/23/1981    Years since quitting: 40.6   Smokeless tobacco: Never  Vaping Use   Vaping Use: Never used  Substance Use Topics   Alcohol use: Yes    Alcohol/week: 8.0 standard drinks of alcohol    Types: 7 Glasses of wine, 1 Cans of beer per week    Comment: glass of wine a night  Drug use: No    Family History  Problem Relation Age of Onset   Stroke Father    Congestive Heart Failure Mother    Diabetes Mother    Hypertension Neg Hx    Colon cancer Neg Hx    Colon polyps Neg Hx    Esophageal cancer Neg Hx    Rectal cancer Neg Hx    Stomach cancer Neg Hx     No Known Allergies  Medication list has been reviewed and updated.  Current Outpatient Medications on File Prior to Visit  Medication Sig Dispense Refill   amLODipine (NORVASC) 10 MG tablet TAKE ONE TABLET BY MOUTH DAILY 90 tablet 1   aspirin 81 MG tablet Take 81 mg by mouth daily.     Coenzyme Q10 100 MG TABS Take 100 mg by mouth daily.      dicyclomine (BENTYL) 10 MG capsule Take 1 pill by mouth every am. May take additional 3 times daily prn 50 capsule 3   ezetimibe (ZETIA) 10 MG tablet TAKE ONE TABLET BY MOUTH DAILY 90 tablet 3   fish oil-omega-3 fatty acids 1000 MG capsule Take 1,400 mg by mouth daily.     Glucosamine-Chondroitin-MSM 500-200-150 MG TABS Take by mouth.     hydrochlorothiazide (HYDRODIURIL) 25 MG tablet Take 1 tablet (25 mg total) by mouth daily. 90 tablet 3   latanoprost (XALATAN) 0.005 % ophthalmic solution Place 1 drop into both eyes daily.     losartan (COZAAR) 100 MG tablet TAKE ONE TABLET BY MOUTH DAILY 90 tablet 3   Plant Sterols and Stanols 450 MG TABS Take 900 mg by mouth daily.      rosuvastatin (CRESTOR) 10 MG tablet Take 1 tablet (10 mg total) by mouth every other day. 45 tablet 3   UNABLE TO FIND Med Name: Focus factor     No current facility-administered medications on file prior to visit.    Review of Systems:  As per HPI- otherwise negative.   Physical Examination: Vitals:   03/10/22 1034  BP: 122/74  Pulse: 78  Resp: 18  Temp: 97.6 F (36.4 C)  SpO2: 94%   Vitals:   03/10/22 1034  Weight: 179 lb 3.2 oz (81.3 kg)  Height: '5\' 2"'$  (1.575 m)   Body mass index is 32.78 kg/m. Ideal Body Weight: Weight in (lb) to have BMI = 25: 136.4  GEN: no acute distress.  Obese, looks well HEENT: Atraumatic, Normocephalic.  Ears and Nose: No external deformity. CV: RRR, No M/G/R. No JVD. No thrill. No extra heart sounds. PULM: CTA B, no wheezes, crackles, rhonchi. No retractions. No resp. distress. No accessory muscle use. EXTR: No c/c/e PSYCH: Normally interactive. Conversant.  Normal pulses and monofilament sensation of both feet.  There is mild tenderness of the right calf  Assessment and Plan: Pain in both lower extremities - Plan: Vitamin B12, Folate, Ferritin, gabapentin (NEURONTIN) 300 MG capsule, VAS [place] ABI WITH/WO TBI  Right calf pain - Plan: US Venous Img Lower Unilateral Right  Patient seen today with bilateral lower extremity pain.  Symptoms are not necessarily consistent with claudication and he does have good pulses.  However, will obtain ABI to rule this out prior to considering nerve conduction studies We will also obtain a right leg ultrasound to rule out DVT Lab work pending as above He does gabapentin does help.  We will have him gradually increase daily gabapentin dosage Will plan further follow- up pending labs and vascular studies  Signed Janett Billow Nahjae Hoeg,  MD  Received patient labs as below, sent message Results for orders placed or performed in visit on 03/10/22  Vitamin B12  Result Value Ref Range   Vitamin B-12 472 211 - 911  pg/mL  Folate  Result Value Ref Range   Folate 19.0 >5.9 ng/mL  Ferritin  Result Value Ref Range   Ferritin 179.8 22.0 - 322.0 ng/mL

## 2022-03-10 ENCOUNTER — Encounter: Payer: Self-pay | Admitting: Family Medicine

## 2022-03-10 ENCOUNTER — Ambulatory Visit (INDEPENDENT_AMBULATORY_CARE_PROVIDER_SITE_OTHER): Payer: PPO | Admitting: Family Medicine

## 2022-03-10 VITALS — BP 122/74 | HR 78 | Temp 97.6°F | Resp 18 | Ht 62.0 in | Wt 179.2 lb

## 2022-03-10 DIAGNOSIS — M79605 Pain in left leg: Secondary | ICD-10-CM | POA: Diagnosis not present

## 2022-03-10 DIAGNOSIS — M79604 Pain in right leg: Secondary | ICD-10-CM

## 2022-03-10 DIAGNOSIS — M79661 Pain in right lower leg: Secondary | ICD-10-CM

## 2022-03-10 LAB — FOLATE: Folate: 19 ng/mL (ref >5.9)

## 2022-03-10 LAB — FERRITIN: Ferritin: 179.8 ng/mL (ref 22.0–322.0)

## 2022-03-10 LAB — VITAMIN B12: Vitamin B-12: 472 pg/mL (ref 211–911)

## 2022-03-10 MED ORDER — GABAPENTIN 300 MG PO CAPS: ORAL_CAPSULE | ORAL | 1 refills | Status: DC

## 2022-03-10 NOTE — Patient Instructions (Signed)
It was good to see you again today- let's get labs and an ultrasound to rule out a blood clot, we will also set up vascular testing for you. Assuming all is ok we will plan to have you see neurology In the meantime increase gabapentin to one in the am as well, can add a noon dose if needed

## 2022-03-15 ENCOUNTER — Ambulatory Visit (HOSPITAL_BASED_OUTPATIENT_CLINIC_OR_DEPARTMENT_OTHER)
Admission: RE | Admit: 2022-03-15 | Discharge: 2022-03-15 | Disposition: A | Discharge status: Home / Self Care | Payer: PPO | Source: Ambulatory Visit | Attending: Family Medicine | Admitting: Family Medicine

## 2022-03-15 ENCOUNTER — Encounter: Payer: Self-pay | Admitting: Family Medicine

## 2022-03-15 DIAGNOSIS — M79661 Pain in right lower leg: Secondary | ICD-10-CM | POA: Insufficient documentation

## 2022-03-15 DIAGNOSIS — M25561 Pain in right knee: Secondary | ICD-10-CM | POA: Diagnosis not present

## 2022-03-17 DIAGNOSIS — M1711 Unilateral primary osteoarthritis, right knee: Secondary | ICD-10-CM | POA: Diagnosis not present

## 2022-03-18 ENCOUNTER — Ambulatory Visit (HOSPITAL_COMMUNITY)
Admission: RE | Admit: 2022-03-18 | Discharge: 2022-03-18 | Disposition: A | Discharge status: Home / Self Care | Payer: PPO | Source: Ambulatory Visit | Attending: Cardiovascular Disease | Admitting: Cardiovascular Disease

## 2022-03-18 DIAGNOSIS — M79605 Pain in left leg: Secondary | ICD-10-CM | POA: Diagnosis not present

## 2022-03-18 DIAGNOSIS — M79604 Pain in right leg: Secondary | ICD-10-CM | POA: Diagnosis not present

## 2022-03-20 ENCOUNTER — Encounter: Payer: Self-pay | Admitting: Family Medicine

## 2022-03-20 DIAGNOSIS — M79604 Pain in right leg: Secondary | ICD-10-CM

## 2022-03-21 NOTE — Telephone Encounter (Signed)
Okay to use 3pm slot?

## 2022-03-24 DIAGNOSIS — M1711 Unilateral primary osteoarthritis, right knee: Secondary | ICD-10-CM | POA: Diagnosis not present

## 2022-04-02 ENCOUNTER — Other Ambulatory Visit: Payer: Self-pay | Admitting: Family Medicine

## 2022-04-02 DIAGNOSIS — E785 Hyperlipidemia, unspecified: Secondary | ICD-10-CM

## 2022-04-22 ENCOUNTER — Telehealth: Payer: Self-pay

## 2022-04-22 ENCOUNTER — Encounter: Payer: Self-pay | Admitting: Cardiology

## 2022-04-22 ENCOUNTER — Other Ambulatory Visit: Payer: Self-pay | Admitting: Family Medicine

## 2022-04-22 ENCOUNTER — Encounter: Payer: Self-pay | Admitting: Family Medicine

## 2022-04-22 DIAGNOSIS — I10 Essential (primary) hypertension: Secondary | ICD-10-CM

## 2022-04-22 NOTE — Telephone Encounter (Signed)
Pt declined appt

## 2022-05-01 NOTE — Progress Notes (Unsigned)
[facility] [address], [place], [place] [address] [phone]  Date:  05/04/2022   Name:  Curtis Tyler   DOB:  [birth year]   MRN:  [id number]  PCP:  [name], MD    Chief Complaint: medication discussion (Concerns/ questions: pains in legs)   History of Present Illness:  Curtis Tyler is a 81 y.o. very pleasant male patient who presents with the following:  Pt seen today to discuss medications Last seen by myself in July-history of prediabetes, coronary artery disease status post CABG, hyperlipidemia, hypertension, carotid stenosis, BPH In July pt had concern of bilateral lower extremity pain.  Did not sound like claudication. ABI done in July- no significant disease noted  Summary:  Right: Resting right ankle-brachial index is within normal range. No  evidence of significant right lower extremity arterial disease. The right  toe-brachial index is abnormal. Left: Resting left ankle-brachial index is within normal range. No  evidence of significant left lower extremity arterial disease. The left  toe-brachial index is normal.   Pt notes that he played golf like he generally does in August- his legs were sore like normal, but the soreness continued for 4-5 days   He did see neurology for this a long time ago- he thinks 2016 He is seeing neurology for follow-up next week He is seeing ortho for knee pain- it wounds like they have him on voltaren gel  He stopped using crestor 5 days ago-he has not noted significant improvement as of yet  He did increase his gabapentin in July and this did help at least somewhat 300 am, 300 noon and 600 bedtime  He does note some difficulty with sleep esp when his legs wake him up- he has tried melatonin but it does not really help   He plans to do his flu shot later on this month   Patient Active Problem List   Diagnosis Date Noted   LUQ pain 09/15/2021   Nausea without  vomiting 09/15/2021   Educated about COVID-19 virus infection 05/11/2020   Lung nodule 04/10/2020   Elevated bilirubin 05/21/2019   Pre-diabetes 05/20/2019   Dyslipidemia 05/03/2019   Bradycardia 09/12/2018   Plantar porokeratosis, acquired 05/16/2018   Abnormal stress ECG with treadmill 02/12/2016   Colonic polyp 09/21/2015   Left lower quadrant pain 09/03/2015   Cellulitis of right lower extremity 05/14/2015   Need for immunization against influenza 05/14/2015   Bilateral low back pain with sciatica 09/03/2014   Hip pain, bilateral 09/03/2014   Bilateral carotid artery stenosis 07/21/2014   Myalgia 07/21/2014   Acute pain of left knee 07/15/2014   Benign prostatic hyperplasia with lower urinary tract symptoms 07/15/2014   Effusion of left knee 07/15/2014   History of elevated prostate specific antigen (PSA) 07/15/2014   Hypertrophy of nasal turbinates 07/15/2014   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 The LAD had a proximal 60-70% stenosis. There was mid 40% stenosis. Diagonal is moderate size with 40% stenosis. The circumflex system included a large OM with 60% ostial stenosis the RCA was occluded before the PDA. The EF was 65%.  2014   Glaucoma    beginning of glaucoma- uses drops    Hyperlipidemia    Hypertension    Myocardial infarct ([place]) 1992   inferior posterior- discovered in 1992-? when  happened    Obesity    Radiculopathy    Shortness of breath dyspnea    with exertion -     Past Surgical History:  Procedure Laterality Date   [place]  05/2004   SHOWING 100% OCCLUDED DISTAL RIGHT CORONARY WITH  LEFT  TO RIGHT COLLATERALS, AS WELL AS ANTEGRADE FLOW, NORMAL LEFT MAIN, 50% NARROWING IN LEFT CIRCUMFLEX  WITH IRREGULARITIES IN THE LAD   CARDIAC CATHETERIZATION N/A 02/12/2016   Procedure: Left Heart Cath and Coronary Angiography;  Surgeon: Leonie Man, MD;  Location: [facility];  Service: Cardiovascular;  Laterality: N/A;   CARDIOVASCULAR STRESS TEST  02/26/2010   EF 65%, SMALL AREA OF MILD INFARCT AND POSSIBLE PER-INFARCT ISCHEMIA IN THE INFEROBASAL WALL   COLONOSCOPY     CORONARY ANGIOPLASTY     RIGHT CORONARY WERE UNSUCCESSFUL   CORONARY ARTERY BYPASS GRAFT N/A 02/25/2016   Procedure: CORONARY ARTERY BYPASS GRAFTING (CABG) times four using the left internal mammary artery and the right saphenus vein ;  Surgeon: [name], MD;  Location: [facility];  Service: Open Heart Surgery;  Laterality: N/A;   HERNIA REPAIR  1960   right hernia repair   INTRAOPERATIVE TRANSESOPHAGEAL ECHOCARDIOGRAM N/A 02/25/2016   Procedure: INTRAOPERATIVE TRANSESOPHAGEAL ECHOCARDIOGRAM;  Surgeon: [name], MD;  Location: [facility];  Service: Open Heart Surgery;  Laterality: N/A;   UMBILICAL HERNIA REPAIR     US ECHOCARDIOGRAPHY  01/03/2007   EF 55-60%    Social History   Tobacco Use   Smoking status: Former    Packs/day: 1.00    Years: 25.00    Total pack years: 25.00    Types: Cigarettes    Quit date: 07/23/1981    Years since quitting: 40.8   Smokeless tobacco: Never  Vaping Use   Vaping Use: Never used  Substance Use Topics   Alcohol use: Yes    Alcohol/week: 8.0 standard drinks of alcohol    Types: 7 Glasses of wine, 1 Cans of beer per week    Comment: glass of wine a night    Drug use: No    Family History  Problem  Relation Age of Onset   Stroke Father    Congestive Heart Failure Mother    Diabetes Mother    Hypertension Neg Hx    Colon cancer Neg Hx    Colon polyps Neg Hx    Esophageal cancer Neg Hx    Rectal cancer Neg Hx    Stomach cancer Neg Hx     No Known Allergies  Medication list has been reviewed and updated.  Current Outpatient Medications on File Prior to Visit  Medication Sig Dispense Refill   amLODipine (NORVASC) 10 MG tablet TAKE ONE TABLET BY MOUTH DAILY (Patient taking differently: 1/2 tab) 90 tablet 1   aspirin 81 MG tablet Take 81 mg by mouth daily.     Coenzyme Q10 100 MG TABS Take 100 mg by mouth daily.  dicyclomine (BENTYL) 10 MG capsule Take 1 pill by mouth every am. May take additional 3 times daily prn 50 capsule 3   ezetimibe (ZETIA) 10 MG tablet TAKE ONE TABLET BY MOUTH DAILY 90 tablet 1   fish oil-omega-3 fatty acids 1000 MG capsule Take 1,400 mg by mouth daily.     gabapentin (NEURONTIN) 300 MG capsule Take 300 mg by mouth am, 300 mg by mouth noon as needed, 600 mg by mouth at bedtime 360 capsule 1   Glucosamine-Chondroitin-MSM 500-200-150 MG TABS Take by mouth.     hydrochlorothiazide (HYDRODIURIL) 25 MG tablet Take 1 tablet (25 mg total) by mouth daily. 90 tablet 3   latanoprost (XALATAN) 0.005 % ophthalmic solution Place 1 drop into both eyes daily.     losartan (COZAAR) 100 MG tablet Take 1 tablet (100 mg total) by mouth daily. 90 tablet 0   Plant Sterols and Stanols 450 MG TABS Take 900 mg by mouth daily.     UNABLE TO FIND Med Name: Focus factor     rosuvastatin (CRESTOR) 10 MG tablet Take 1 tablet (10 mg total) by mouth every other day. (Patient not taking: Reported on 05/04/2022) 45 tablet 3   No current facility-administered medications on file prior to visit.    Review of Systems:  As per HPI- otherwise negative.  Physical Examination: Vitals:   05/04/22 1304  BP: 122/70  Pulse: 65  Resp: 18  Temp: 97.9 F (36.6 C)  SpO2: 95%   Vitals:    05/04/22 1304  Weight: 180 lb 6.4 oz (81.8 kg)  Height: '5\' 2"'$  (1.575 m)   Body mass index is 33 kg/m. Ideal Body Weight: Weight in (lb) to have BMI = 25: 136.4  GEN: no acute distress.  Central obesity, looks well HEENT: Atraumatic, Normocephalic.  Ears and Nose: No external deformity. CV: RRR, No M/G/R. No JVD. No thrill. No extra heart sounds. PULM: CTA B, no wheezes, crackles, rhonchi. No retractions. No resp. distress. No accessory muscle use. ABD: S, NT, ND, +BS. No rebound. No HSM. EXTR: No c/c/e PSYCH: Normally interactive. Conversant. ; Normal pulses in both feet, his legs are nontender to palpation.  No calf swelling or cords.  Normal ankle dorsiflexion and plantarflexion  Assessment and Plan: Insomnia, unspecified type - Plan: traZODone (DESYREL) 50 MG tablet  Pain in both lower extremities  Patient seen today to follow-up lower extremity pain.  His cardiologist has okayed him coming off his statin.  So far he has been off for about a week, no major improvement yet.  We hope you will continue to see improvement We also increased his dose of gabapentin-plan to stick with this dose for the time being so we do not make too many changes at once He is seeing neurology next week, I look forward to Dr. Cathren Laine thoughts  For insomnia, we will have him try trazodone as needed-discussed use of this medication in detail with patient  Signed Lamar Blinks, MD

## 2022-05-04 ENCOUNTER — Ambulatory Visit (INDEPENDENT_AMBULATORY_CARE_PROVIDER_SITE_OTHER): Payer: PPO | Admitting: Family Medicine

## 2022-05-04 VITALS — BP 122/70 | HR 65 | Temp 97.9°F | Resp 18 | Ht 62.0 in | Wt 180.4 lb

## 2022-05-04 DIAGNOSIS — M79604 Pain in right leg: Secondary | ICD-10-CM

## 2022-05-04 DIAGNOSIS — M79605 Pain in left leg: Secondary | ICD-10-CM

## 2022-05-04 DIAGNOSIS — G4700 Insomnia, unspecified: Secondary | ICD-10-CM | POA: Diagnosis not present

## 2022-05-04 DIAGNOSIS — H401131 Primary open-angle glaucoma, bilateral, mild stage: Secondary | ICD-10-CM | POA: Diagnosis not present

## 2022-05-04 MED ORDER — TRAZODONE HCL 50 MG PO TABS: 25.0000 mg | ORAL_TABLET | Freq: Every evening | ORAL | 3 refills | Status: DC | PRN

## 2022-05-04 NOTE — Patient Instructions (Addendum)
I am glad you are seeing neurology next week- please let me know what they think Continue current dose of gabapentin for now I am hoping as you continue to stay off the cholesterol medication your leg pain may continue to improve We can try increasing your gabapentin at a later date and/ or do something else per neurology   Plan for a flu shot later on this fall and a covid booster if indicated

## 2022-05-06 ENCOUNTER — Encounter: Payer: Self-pay | Admitting: Family Medicine

## 2022-05-06 DIAGNOSIS — M1711 Unilateral primary osteoarthritis, right knee: Secondary | ICD-10-CM | POA: Diagnosis not present

## 2022-05-11 ENCOUNTER — Encounter: Payer: Self-pay | Admitting: Neurology

## 2022-05-11 ENCOUNTER — Telehealth: Payer: Self-pay | Admitting: Neurology

## 2022-05-11 ENCOUNTER — Ambulatory Visit: Payer: PPO | Admitting: Neurology

## 2022-05-11 ENCOUNTER — Encounter: Payer: Self-pay | Admitting: Family Medicine

## 2022-05-11 VITALS — BP 165/81 | HR 70 | Ht 62.0 in | Wt 183.6 lb

## 2022-05-11 DIAGNOSIS — M5441 Lumbago with sciatica, right side: Secondary | ICD-10-CM

## 2022-05-11 DIAGNOSIS — G8929 Other chronic pain: Secondary | ICD-10-CM | POA: Diagnosis not present

## 2022-05-11 DIAGNOSIS — M48062 Spinal stenosis, lumbar region with neurogenic claudication: Secondary | ICD-10-CM

## 2022-05-11 DIAGNOSIS — M791 Myalgia, unspecified site: Secondary | ICD-10-CM | POA: Diagnosis not present

## 2022-05-11 DIAGNOSIS — M79605 Pain in left leg: Secondary | ICD-10-CM | POA: Diagnosis not present

## 2022-05-11 DIAGNOSIS — M51379 Other intervertebral disc degeneration, lumbosacral region without mention of lumbar back pain or lower extremity pain: Secondary | ICD-10-CM

## 2022-05-11 DIAGNOSIS — M5137 Other intervertebral disc degeneration, lumbosacral region: Secondary | ICD-10-CM

## 2022-05-11 DIAGNOSIS — M79604 Pain in right leg: Secondary | ICD-10-CM | POA: Diagnosis not present

## 2022-05-11 DIAGNOSIS — M5442 Lumbago with sciatica, left side: Secondary | ICD-10-CM | POA: Diagnosis not present

## 2022-05-11 NOTE — Patient Instructions (Addendum)
One blood work MRI lumbar spine Physical Therapy

## 2022-05-11 NOTE — Progress Notes (Signed)
[facility]    Provider:  Dr Jaynee Eagles Requesting Provider: Lorelei Pont, Gay Filler, MD Primary Care Provider:  [name], MD  CC:  Muscle pain in the calfs, shins, no numbness or tingling in feet or pain, feeling better with changes in amlodipine and crestor but also increased gabapentin  HPI:  Curtis Tyler is a 81 y.o. male here as requested by Copland, Gay Filler, MD for neuropathy.  Past medical history hypertension, coronary artery disease, cerebrovascular disease, bilateral carotid artery stenosis, lung nodule, hypogonadism in male, bilateral low back pain with sciatica, effusion of left knee, prediabetes, myalgia, hyperlipidemia, bilateral hip pain, dyslipidemia, cough, bradycardia, abnormal stress EKG with treadmill.  Several years ago started having feelings in the back of his knees warm and pain and radiates to the calfs and sometimes in his shins. Feels it in his shins and in bed at night. Nothing in the feet, feet are fine. No numbness, no tingling, no burning. Ongoing 20 years and saw neurology, nothing was found(I reviewed Epic he saw Dr. Leta Baptist for headache in 2014 but I find nothing for his legs for neurology). The last neurologist decreased crestor and it helps. He can't walk around the store because of soreness, muscle soreness. Also has knee pain. He has shin pain. Sleeping with a pillow between the legs help. He has had sciatica in the past. Stretching helps, he went to PT when he had sciatica 5 years ago. No weakness. They cut the amlodipine in half and he improved. Now more in the calfs. Worse in the mornings. They stopped the cholesterol medication and is helping. Muscle pain in the calfs, shins, no numbness or tingling, feeling better with changes in amlodipine and crestor but also increased gabapentin. No thigh pain.No other focal neurologic deficits, associated symptoms, inciting events or modifiable factors.   Reviewed notes, labs and imaging from  outside physicians, which showed:  03/15/2022: IMPRESSION: No evidence of right lower extreme deep venous thrombosis.  02/17/2022: IMPRESSION: Normal brain MRI with no acute intracranial pathology or other finding to explain the patient's symptoms.   I reviewed labs which include ferritin, folate, B12, TSH, hemoglobin A1c, CMP, CBC Dec 29, 2021: All normal except glucose 120, BUN 28 creatinine 1.11 and hemoglobin A1c 6.4.  Review of Systems: Patient complains of symptoms per HPI as well as the following symptoms muscle soreness. Pertinent negatives and positives per HPI. All others negative.   Social History   Socioeconomic History   Marital status: Married    Spouse name: Sharyn Lull   Number of children: 2   Years of education: HS   Highest education level: Not on file  Occupational History   Occupation: retired  Tobacco Use   Smoking status: Former    Packs/day: 1.00    Years: 25.00    Total pack years: 25.00    Types: Cigarettes    Quit date: 07/23/1981    Years since quitting: 40.8   Smokeless tobacco: Never  Vaping Use   Vaping Use: Never used  Substance and Sexual Activity   Alcohol use: Yes    Alcohol/week: 7.0 standard drinks of alcohol    Types: 7 Glasses of wine per week    Comment: glass of wine a night    Drug use: No   Sexual activity: Yes  Other Topics Concern   Not on file  Social History Narrative   Positive HTN and Stroke among his parents who lived up until their late 80's   Patient lives at  home with his spouse.   Caffeine Use: 2-4 cups daily   Social Determinants of Health   Financial Resource Strain: Low Risk  (11/15/2019)   Overall Financial Resource Strain (CARDIA)    Difficulty of Paying Living Expenses: Not hard at all  Food Insecurity: No Food Insecurity (11/15/2019)   Hunger Vital Sign    Worried About Running Out of Food in the Last Year: Never true    Ran Out of Food in the Last Year: Never true  Transportation Needs: No Transportation  Needs (11/15/2019)   PRAPARE - Hydrologist (Medical): No    Lack of Transportation (Non-Medical): No  Physical Activity: Not on file  Stress: Not on file  Social Connections: Not on file  Intimate Partner Violence: Not on file    Family History  Problem Relation Age of Onset   Congestive Heart Failure Mother    Diabetes Mother    Stroke Father    Hypertension Neg Hx    Colon cancer Neg Hx    Colon polyps Neg Hx    Esophageal cancer Neg Hx    Rectal cancer Neg Hx    Stomach cancer Neg Hx    Neuropathy Neg Hx     Past Medical History:  Diagnosis Date   Arthritis    BPH (benign prostatic hyperplasia)    Bunion    Callus    Carotid artery occlusion    Coronary artery disease    left main 60% stenosis. The LAD had a proximal 60-70% stenosis. There was mid 40% stenosis. Diagonal is moderate size with 40% stenosis. The circumflex system included a large OM with 60% ostial stenosis the RCA was occluded before the PDA. The EF was 65%.  2014   Glaucoma    beginning of glaucoma- uses drops    Hyperlipidemia    Hypertension    Myocardial infarct ([place]) 1992   inferior posterior- discovered in 1992-? when happened    Obesity    Radiculopathy    Shortness of breath dyspnea    with exertion -     Patient Active Problem List   Diagnosis Date Noted   LUQ pain 09/15/2021   Nausea without vomiting 09/15/2021   Educated about COVID-19 virus infection 05/11/2020   Lung nodule 04/10/2020   Elevated bilirubin 05/21/2019   Pre-diabetes 05/20/2019   Dyslipidemia 05/03/2019   Bradycardia 09/12/2018   Plantar porokeratosis, acquired 05/16/2018   Abnormal stress ECG with treadmill 02/12/2016   Colonic polyp 09/21/2015   Left lower quadrant pain 09/03/2015   Cellulitis of right lower extremity 05/14/2015   Need for immunization against influenza 05/14/2015   Bilateral low back pain with sciatica 09/03/2014   Hip pain, bilateral 09/03/2014   Bilateral  carotid artery stenosis 07/21/2014   Myalgia 07/21/2014   Acute pain of left knee 07/15/2014   Benign prostatic hyperplasia with lower urinary tract symptoms 07/15/2014   Effusion of left knee 07/15/2014   History of elevated prostate specific antigen (PSA) 07/15/2014   Hypertrophy of nasal turbinates 07/15/2014   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   Incomplete emptying of bladder 07/15/2014   Increased urinary frequency 07/15/2014   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   Chest pain 09/05/2012   Cough 08/06/2011   Coronary artery disease involving native coronary artery without angina pectoris 04/26/2011   Essential hypertension 04/26/2011   Hyperlipidemia with target LDL less than 70 04/26/2011   Cerebrovascular disease 04/26/2011    Past Surgical History:  Procedure Laterality Date  [place]  05/2004   SHOWING 100% OCCLUDED DISTAL RIGHT CORONARY WITH  LEFT  TO RIGHT COLLATERALS, AS WELL AS ANTEGRADE FLOW, NORMAL LEFT MAIN, 50% NARROWING IN LEFT CIRCUMFLEX  WITH IRREGULARITIES IN THE LAD   CARDIAC CATHETERIZATION N/A 02/12/2016   Procedure: Left Heart Cath and Coronary Angiography;  Surgeon: Leonie Man, MD;  Location: [facility];  Service: Cardiovascular;  Laterality: N/A;   CARDIOVASCULAR STRESS TEST  02/26/2010   EF 65%, SMALL AREA OF MILD INFARCT AND POSSIBLE PER-INFARCT ISCHEMIA IN THE INFEROBASAL WALL   COLONOSCOPY     CORONARY ANGIOPLASTY     RIGHT CORONARY WERE UNSUCCESSFUL   CORONARY ARTERY BYPASS GRAFT N/A 02/25/2016   Procedure: CORONARY ARTERY BYPASS GRAFTING (CABG) times four using the left internal mammary artery and the right saphenus vein ;  Surgeon: [name], MD;  Location: [facility];  Service: Open Heart Surgery;  Laterality: N/A;   HERNIA REPAIR  1960   right hernia repair   INTRAOPERATIVE TRANSESOPHAGEAL ECHOCARDIOGRAM N/A 02/25/2016   Procedure: INTRAOPERATIVE TRANSESOPHAGEAL ECHOCARDIOGRAM;  Surgeon:  [name], MD;  Location: [facility];  Service: Open Heart Surgery;  Laterality: N/A;   UMBILICAL HERNIA REPAIR     US ECHOCARDIOGRAPHY  01/03/2007   EF 55-60%    Current Outpatient Medications  Medication Sig Dispense Refill   amLODipine (NORVASC) 10 MG tablet TAKE ONE TABLET BY MOUTH DAILY (Patient taking differently: 5 mg.) 90 tablet 1   aspirin 81 MG tablet Take 81 mg by mouth daily.     Coenzyme Q10 100 MG TABS Take 100 mg by mouth daily.      dicyclomine (BENTYL) 10 MG capsule Take 1 pill by mouth every am. May take additional 3 times daily prn 50 capsule 3   fish oil-omega-3 fatty acids 1000 MG capsule Take 1,400 mg by mouth daily.     gabapentin (NEURONTIN) 300 MG capsule Take 300 mg by mouth am, 300 mg by mouth noon as needed, 600 mg by mouth at bedtime 360 capsule 1   Glucosamine-Chondroitin-MSM 500-200-150 MG TABS Take by mouth.     hydrochlorothiazide (HYDRODIURIL) 25 MG tablet Take 1 tablet (25 mg total) by mouth daily. 90 tablet 3   latanoprost (XALATAN) 0.005 % ophthalmic solution Place 1 drop into both eyes daily.     losartan (COZAAR) 100 MG tablet Take 1 tablet (100 mg total) by mouth daily. 90 tablet 0   meloxicam (MOBIC) 15 MG tablet Take 1 tablet by mouth with food for pain and inflammation.     Plant Sterols and Stanols 450 MG TABS Take 900 mg by mouth daily.     No current facility-administered medications for this visit.    Allergies as of 05/11/2022   (No Known Allergies)    Vitals: BP (!) 165/81   Pulse 70   Ht '5\' 2"'$  (1.575 m)   Wt 183 lb 9.6 oz (83.3 kg)   BMI 33.58 kg/m  Last Weight:  Wt Readings from Last 1 Encounters:  05/11/22 183 lb 9.6 oz (83.3 kg)   Last Height:   Ht Readings from Last 1 Encounters:  05/11/22 '5\' 2"'$  (1.575 m)     Physical exam: Exam: Gen: NAD, conversant, well nourised, obese, well groomed                     CV: RRR, no MRG. No Carotid Bruits. No peripheral edema, warm, nontender Eyes: Conjunctivae clear without  exudates or hemorrhage  Neuro: Detailed Neurologic Exam  Speech:    Speech is normal; fluent and spontaneous with normal comprehension.  Cognition:    The patient is oriented to person, place, and time;     recent and remote memory intact;     language fluent;     normal attention, concentration,     fund of knowledge Cranial Nerves:    The pupils are equal, round, and reactive to light. Attempted, pupils too small to visualize. Visual fields are full to finger confrontation. Extraocular movements are intact. Trigeminal sensation is intact and the muscles of mastication are normal. The face is symmetric. The palate elevates in the midline. Hearing intact. Voice is normal. Shoulder shrug is normal. The tongue has normal motion without fasciculations.   Coordination:    Normal  Gait:    Heel-toe intact, some imbalance with tandem gait. Slightly wide based  Motor Observation:    No asymmetry, no atrophy, and no involuntary movements noted. Tone:    Normal muscle tone.    Posture:    Posture is normal. normal erect    Strength:    Strength is V/V in the upper and lower limbs.      Sensation: intact to LT, pin prick, vibration distally     Reflex Exam:  DTR's:    Deep tendon reflexes in the upper and lower extremities are normal bilaterally.   Toes:    The toes are downgoing bilaterally.   Clonus:    Clonus is absent.    Assessment/Plan:  Lovely 81 y.o. male here as requested by Copland, Gay Filler, MD for neuropathy.  Past medical history hypertension, coronary artery disease, cerebrovascular disease, bilateral carotid artery stenosis, lung nodule, hypogonadism in male, bilateral low back pain with sciatica, effusion of left knee, prediabetes, myalgia, hyperlipidemia, bilateral hip pain, dyslipidemia, cough, bradycardia, abnormal stress EKG with treadmill.  Muscle pain in the calfs, shins, no numbness or tingling in the feet or legs, feeling somewhat better with changes in  amlodipine and crestor but also increased gabapentin: Unlikely peripheral polyneuropathy(such as diabetic neuropathy) bc no sensory changes and intact distally to pin prick and vibration and asymptomatic in the feet. Unlikely neurologic muscle disease, strength is normal but will check a CK. Denies any symptoms in the thighs which are 5/5 in stregth so unlikely amyotrophy.   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   Will send to physical therapy  Orders Placed This Encounter  Procedures   MR LUMBAR SPINE WO CONTRAST   CK   Ambulatory referral to Physical Therapy     Cc: Copland, Gay Filler, MD,  Copland, Gay Filler, MD  Sarina Ill, MD  [facility] [address] [place], [place] [address]  Phone [phone] Fax [phone]

## 2022-05-11 NOTE — Telephone Encounter (Signed)
healthteam adv NPR sent to GI

## 2022-05-12 LAB — CK: Total CK: 230 U/L — ABNORMAL HIGH (ref 30–208)

## 2022-05-13 ENCOUNTER — Other Ambulatory Visit: Payer: Self-pay | Admitting: Family Medicine

## 2022-05-13 DIAGNOSIS — E785 Hyperlipidemia, unspecified: Secondary | ICD-10-CM

## 2022-05-16 ENCOUNTER — Ambulatory Visit: Payer: PPO | Admitting: Diagnostic Neuroimaging

## 2022-05-21 ENCOUNTER — Ambulatory Visit
Admission: RE | Admit: 2022-05-21 | Discharge: 2022-05-21 | Disposition: A | Discharge status: Home / Self Care | Payer: PPO | Source: Ambulatory Visit | Attending: Neurology | Admitting: Neurology

## 2022-05-21 DIAGNOSIS — M5441 Lumbago with sciatica, right side: Secondary | ICD-10-CM | POA: Diagnosis not present

## 2022-05-21 DIAGNOSIS — M5137 Other intervertebral disc degeneration, lumbosacral region: Secondary | ICD-10-CM

## 2022-05-21 DIAGNOSIS — G8929 Other chronic pain: Secondary | ICD-10-CM | POA: Diagnosis not present

## 2022-05-21 DIAGNOSIS — M48062 Spinal stenosis, lumbar region with neurogenic claudication: Secondary | ICD-10-CM

## 2022-05-21 DIAGNOSIS — M48061 Spinal stenosis, lumbar region without neurogenic claudication: Secondary | ICD-10-CM | POA: Diagnosis not present

## 2022-05-21 DIAGNOSIS — M51379 Other intervertebral disc degeneration, lumbosacral region without mention of lumbar back pain or lower extremity pain: Secondary | ICD-10-CM

## 2022-05-21 DIAGNOSIS — M5442 Lumbago with sciatica, left side: Secondary | ICD-10-CM | POA: Diagnosis not present

## 2022-05-21 DIAGNOSIS — M79604 Pain in right leg: Secondary | ICD-10-CM

## 2022-05-21 DIAGNOSIS — M791 Myalgia, unspecified site: Secondary | ICD-10-CM

## 2022-05-23 ENCOUNTER — Encounter: Payer: Self-pay | Admitting: Family Medicine

## 2022-05-23 ENCOUNTER — Encounter: Payer: Self-pay | Admitting: Neurology

## 2022-05-23 NOTE — Progress Notes (Signed)
Mr. Mccord: The MRI of your lumbar spine showed arthritis at multiple levels which could be causing significant pain however the spinal cord appears normal and there is no appearance of pinched nerves pinched nerves however again arthritis in the low back and cause significant pain and I do suggest that you continue to physical therapy and follow-up with [place] if there is no improvement.  I am not sure why you have a large cyst on each side in your kidneys, but I am going to [place] your primary care doctor and I think you should follow-up with Dr. Lorelei Pont about that thank you.

## 2022-05-24 ENCOUNTER — Ambulatory Visit: Payer: PPO | Admitting: Neurology

## 2022-05-25 ENCOUNTER — Encounter: Payer: Self-pay | Admitting: Family Medicine

## 2022-05-26 ENCOUNTER — Ambulatory Visit: Payer: PPO | Attending: Neurology | Admitting: Physical Therapy

## 2022-05-26 ENCOUNTER — Encounter: Payer: Self-pay | Admitting: Family Medicine

## 2022-05-26 ENCOUNTER — Encounter: Payer: Self-pay | Admitting: Physical Therapy

## 2022-05-26 DIAGNOSIS — M5137 Other intervertebral disc degeneration, lumbosacral region: Secondary | ICD-10-CM | POA: Insufficient documentation

## 2022-05-26 DIAGNOSIS — G8929 Other chronic pain: Secondary | ICD-10-CM | POA: Diagnosis not present

## 2022-05-26 DIAGNOSIS — M791 Myalgia, unspecified site: Secondary | ICD-10-CM | POA: Insufficient documentation

## 2022-05-26 DIAGNOSIS — M5442 Lumbago with sciatica, left side: Secondary | ICD-10-CM | POA: Insufficient documentation

## 2022-05-26 DIAGNOSIS — M5441 Lumbago with sciatica, right side: Secondary | ICD-10-CM | POA: Diagnosis not present

## 2022-05-26 DIAGNOSIS — M5459 Other low back pain: Secondary | ICD-10-CM | POA: Diagnosis not present

## 2022-05-26 DIAGNOSIS — M79604 Pain in right leg: Secondary | ICD-10-CM | POA: Insufficient documentation

## 2022-05-26 DIAGNOSIS — M79605 Pain in left leg: Secondary | ICD-10-CM | POA: Insufficient documentation

## 2022-05-26 DIAGNOSIS — M48062 Spinal stenosis, lumbar region with neurogenic claudication: Secondary | ICD-10-CM | POA: Insufficient documentation

## 2022-05-26 NOTE — Therapy (Signed)
OUTPATIENT PHYSICAL THERAPY THORACOLUMBAR EVALUATION   Patient Name: Curtis Tyler MRN: [id number] DOB:26-Nov-1940, 81 y.o., male Today's Date: 05/26/2022   PT End of Session - 05/26/22 0847     Visit Number 1    Date for PT Re-Evaluation 08/25/22    Authorization Type HTA    PT Start Time 0845    PT Stop Time 0930    PT Time Calculation (min) 45 min    Activity Tolerance Patient tolerated treatment well    Behavior During Therapy [facility] for tasks assessed/performed             Past Medical History:  Diagnosis Date   Arthritis    BPH (benign prostatic hyperplasia)    Bunion    Callus    Carotid artery occlusion    Coronary artery disease    left main 60% stenosis. The LAD had a proximal 60-70% stenosis. There was mid 40% stenosis. Diagonal is moderate size with 40% stenosis. The circumflex system included a large OM with 60% ostial stenosis the RCA was occluded before the PDA. The EF was 65%.  2014   Glaucoma    beginning of glaucoma- uses drops    Hyperlipidemia    Hypertension    Myocardial infarct ([place]) 1992   inferior posterior- discovered in 1992-? when happened    Obesity    Radiculopathy    Shortness of breath dyspnea    with exertion -    Past Surgical History:  Procedure Laterality Date   [place]  05/2004   SHOWING 100% OCCLUDED DISTAL RIGHT CORONARY WITH  LEFT  TO RIGHT COLLATERALS, AS WELL AS ANTEGRADE FLOW, NORMAL LEFT MAIN, 50% NARROWING IN LEFT CIRCUMFLEX  WITH IRREGULARITIES IN THE LAD   CARDIAC CATHETERIZATION N/A 02/12/2016   Procedure: Left Heart Cath and Coronary Angiography;  Surgeon: Leonie Man, MD;  Location: [facility];  Service: Cardiovascular;  Laterality: N/A;   CARDIOVASCULAR STRESS TEST  02/26/2010   EF 65%, SMALL AREA OF MILD INFARCT AND POSSIBLE PER-INFARCT ISCHEMIA IN THE INFEROBASAL WALL   COLONOSCOPY     CORONARY ANGIOPLASTY     RIGHT CORONARY WERE UNSUCCESSFUL   CORONARY ARTERY BYPASS  GRAFT N/A 02/25/2016   Procedure: CORONARY ARTERY BYPASS GRAFTING (CABG) times four using the left internal mammary artery and the right saphenus vein ;  Surgeon: [name], MD;  Location: [facility];  Service: Open Heart Surgery;  Laterality: N/A;   HERNIA REPAIR  1960   right hernia repair   INTRAOPERATIVE TRANSESOPHAGEAL ECHOCARDIOGRAM N/A 02/25/2016   Procedure: INTRAOPERATIVE TRANSESOPHAGEAL ECHOCARDIOGRAM;  Surgeon: [name], MD;  Location: [facility];  Service: Open Heart Surgery;  Laterality: N/A;   UMBILICAL HERNIA REPAIR     US ECHOCARDIOGRAPHY  01/03/2007   EF 55-60%   Patient Active Problem List   Diagnosis Date Noted   LUQ pain 09/15/2021   Nausea without vomiting 09/15/2021   Educated about COVID-19 virus infection 05/11/2020   Lung nodule 04/10/2020   Elevated bilirubin 05/21/2019   Pre-diabetes 05/20/2019   Dyslipidemia 05/03/2019   Bradycardia 09/12/2018   Plantar porokeratosis, acquired 05/16/2018   Abnormal stress ECG with treadmill 02/12/2016   Colonic polyp 09/21/2015   Left lower quadrant pain 09/03/2015   Cellulitis of right lower extremity 05/14/2015   Need for immunization against influenza 05/14/2015   Bilateral low back pain with sciatica 09/03/2014   Hip pain, bilateral 09/03/2014   Bilateral carotid artery stenosis 07/21/2014  Myalgia 07/21/2014   Acute pain of left knee 07/15/2014   Benign prostatic hyperplasia with lower urinary tract symptoms 07/15/2014   Effusion of left knee 07/15/2014   History of elevated prostate specific antigen (PSA) 07/15/2014   Hypertrophy of nasal turbinates 07/15/2014   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   Incomplete emptying of bladder 07/15/2014   Increased urinary frequency 07/15/2014   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   Chest pain 09/05/2012   Cough 08/06/2011   Coronary artery disease involving native coronary artery without angina pectoris 04/26/2011   Essential hypertension 04/26/2011    Hyperlipidemia with target LDL less than 70 04/26/2011   Cerebrovascular disease 04/26/2011    PCP: Copland  REFERRING PROVIDER: Copland  REFERRING DIAG: LBP with LE pain  Rationale for Evaluation and Treatment Rehabilitation  THERAPY DIAG:  Other low back pain  Pain in left leg  Pain in right leg  ONSET DATE: August 2023  SUBJECTIVE:                                                                                                                                                                                           SUBJECTIVE STATEMENT: Patient has had some back and LE pain for a number of years, he reports that he does well at times but recently had some increased pain in the legs after playing golf.  Testing of circulation was negative.  MRI showed some stenosis of the low back and the neurologist thinks the leg pain could be from the back. PERTINENT HISTORY:  See above  PAIN:  Are you having pain? Yes: NPRS scale: 2-3/10 Pain location: hamstring and calves Pain description: stiff, ache, tired, sore Aggravating factors: golf, getting up from sitting, getting up in the morning, doing more Relieving factors: Tylenol, gabapentin at times pain is 0/10   PRECAUTIONS: None  WEIGHT BEARING RESTRICTIONS No  FALLS:  Has patient fallen in last 6 months? No  LIVING ENVIRONMENT: Lives with: lives with their family Lives in: House/apartment Stairs: No Has following equipment at home: None  OCCUPATION: retired  PLOF: Independent  PATIENT GOALS have less pain, have no surgeries   OBJECTIVE:   DIAGNOSTIC FINDINGS:  MRI:  IMPRESSION: This MRI of the lumbar spine without contrast shows the following: At L4-L5, there is mild spinal stenosis in the transverse diameter due to disc bulging, facet hypertrophy and ligamenta flava hypertrophy.  There is moderate lateral recess stenosis, right greater than left though there does not appear to be nerve root compression. At  L5-S1, there is mild spinal stenosis due to increased epidural fat and minimal facet hypertrophy.  There is no nerve root  compression. Multiple large renal cysts bilaterally as detailed above.  The largest cyst on each side has increased in size compared to the CT scan from 10/01/2019.  PATIENT SURVEYS:  FOTO 52  SCREENING FOR RED FLAGS: Bowel or bladder incontinence: No Spinal tumors: No Cauda equina syndrome: No Compression fracture: No Abdominal aneurysm: No  COGNITION:  Overall cognitive status: Within functional limits for tasks assessed     SENSATION: WFL  MUSCLE LENGTH: Hamstrings: Right 40 deg; Left 45 deg Very tight piriformis, calves and quads  POSTURE: rounded shoulders, forward head, and decreased lumbar lordosis  PALPATION: Some tenderness in the low back and the calves  LUMBAR ROM:   Active  A/PROM  eval  Flexion Decreased 50%  Extension Decreased 50%  Right lateral flexion Decreased 50%  Left lateral flexion Decreased 50%  Right rotation   Left rotation    (Blank rows = not tested) Very tight HS and calves LOWER EXTREMITY ROM:      LOWER EXTREMITY MMT:    MMT Right eval Left eval  Hip flexion 4 4  Hip extension    Hip abduction 4 4  Hip adduction    Hip internal rotation    Hip external rotation    Knee flexion 4 4  Knee extension 4 4  Ankle dorsiflexion 4+ 4+  Ankle plantarflexion 4 4  Ankle inversion    Ankle eversion     (Blank rows = not tested)  LUMBAR SPECIAL TESTS:  Marcello Moores test: Positive  GAIT: Distance walked: 100 feet Assistive device utilized: None Level of assistance: Complete Independence Comments: mild antalgic on the right    TODAY'S TREATMENT  HEP   PATIENT EDUCATION:  Education details: see below Person educated: Patient Education method: Consulting civil engineer, Media planner, Verbal cues, and Handouts Education comprehension: verbalized understanding   HOME EXERCISE PROGRAM: Access Code: [email or web address] URL:  [email or web address] Date: 05/26/2022 Prepared by: Lum Babe  Exercises - Hooklying Single Knee to Chest  - 2 x daily - 7 x weekly - 1 sets - 10 reps - 10 hold - Supine Lower Trunk Rotation  - 2 x daily - 7 x weekly - 1 sets - 10 reps - 10 hold - Supine Piriformis Stretch Pulling Heel to Hip  - 2 x daily - 7 x weekly - 1 sets - 10 reps - 30 hold - Supine Quadriceps Stretch with Strap on Table  - 2 x daily - 7 x weekly - 1 sets - 10 reps - 20 hold - Standing Bilateral Gastroc Stretch with Step  - 2 x daily - 7 x weekly - 1 sets - 10 reps - 30 hold - Seated Hamstring Stretch with Chair  - 2 x daily - 7 x weekly - 1 sets - 10 reps - 30 hold  ASSESSMENT:  CLINICAL IMPRESSION: Patient is a 81 y.o. male who was seen today for physical therapy evaluation and treatment for LBP and leg pain, he is very tight in the HS, piriformis and the calves.  C/O stiffness and a loss of flexibility    OBJECTIVE IMPAIRMENTS Abnormal gait, cardiopulmonary status limiting activity, decreased activity tolerance, decreased balance, decreased endurance, decreased mobility, difficulty walking, decreased ROM, decreased strength, increased muscle spasms, impaired flexibility, improper body mechanics, postural dysfunction, and pain.   REHAB POTENTIAL: Good  CLINICAL DECISION MAKING: Stable/uncomplicated  EVALUATION COMPLEXITY: Low   GOALS: Goals reviewed with patient? Yes  SHORT TERM GOALS: Target date: 06/09/22  Independent with initial HEP Goal status: INITIAL  LONG TERM GOALS:  Target date:08/18/22  Understand posture and body mechanics Goal status: INITIAL  2.  Decrease pain 50% Goal status: INITIAL  3.  Return to gym or advanced HEP Goal status: INITIAL  4.  Reports feeling of stiffness decresaed 50% Goal status: INITIAL  PLAN: PT FREQUENCY: 1-2x/week  PT DURATION: 12 weeks  PLANNED INTERVENTIONS: Therapeutic exercises, Therapeutic activity, Neuromuscular re-education,  Balance training, Gait training, Patient/Family education, Self Care, Joint mobilization, Dry Needling, Electrical stimulation, Spinal mobilization, Cryotherapy, Moist heat, Traction, Ultrasound, and Manual therapy.  PLAN FOR NEXT SESSION: start gym, flexibility and traction   Sumner Boast, PT 05/26/2022, 9:23 AM

## 2022-05-27 NOTE — Progress Notes (Unsigned)
[facility] [address], [place], [place] [address] [phone]  Date:  05/30/2022   Name:  Curtis Tyler   DOB:  [birth year]   MRN:  [id number]  PCP:  [name], MD    Chief Complaint: No chief complaint on file.   History of Present Illness:  Curtis Tyler is a 81 y.o. very pleasant male patient who presents with the following:  Patient seen today virtually to discuss cholesterol treatment Patient location is home, my location is office.  Patient and confirmed with 2 factors, he gives consent for virtual visit today The patient myself are present on the visit today Most recent visit with myself was earlier this month, 9/6 -history of prediabetes, coronary artery disease status post CABG, hyperlipidemia, hypertension, carotid stenosis, BPH  Last visit he had concern of leg pain that was affecting his golf.  He has stopped Crestor but as of our visit had not noted significant improvement He saw neurology, Dr. Ronnette Hila on September 13; she thought likely neurogenic claudication.  She recommended physical therapy as well as an MRI lumbar spine  MRI of the spine showed arthritis but not spinal stenosis.  It did also reveal significant bilateral renal cysts which are being evaluated by urology  Patient notes he stopped his Crestor about 1 month ago He is overall feeling better-he is not sure if this is due to stopping Crestor increasing gabapentin He went to the gym, did a walk and used some machines recently and his legs hurt less He is also doing PT but but does not know how much it helps him  Patient Active Problem List   Diagnosis Date Noted   LUQ pain 09/15/2021   Nausea without vomiting 09/15/2021   Educated about COVID-19 virus infection 05/11/2020   Lung nodule 04/10/2020   Elevated bilirubin 05/21/2019   Pre-diabetes 05/20/2019   Dyslipidemia 05/03/2019   Bradycardia 09/12/2018   Plantar porokeratosis,  acquired 05/16/2018   Abnormal stress ECG with treadmill 02/12/2016   Colonic polyp 09/21/2015   Left lower quadrant pain 09/03/2015   Cellulitis of right lower extremity 05/14/2015   Need for immunization against influenza 05/14/2015   Bilateral low back pain with sciatica 09/03/2014   Hip pain, bilateral 09/03/2014   Bilateral carotid artery stenosis 07/21/2014   Myalgia 07/21/2014   Acute pain of left knee 07/15/2014   Benign prostatic hyperplasia with lower urinary tract symptoms 07/15/2014   Effusion of left knee 07/15/2014   History of elevated prostate specific antigen (PSA) 07/15/2014   Hypertrophy of nasal turbinates 07/15/2014   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 The LAD had a proximal 60-70% stenosis. There was mid 40% stenosis. Diagonal is moderate size with 40% stenosis. The circumflex system included a large OM with 60% ostial stenosis the RCA was occluded before the PDA. The EF  was 65%.  2014   Glaucoma    beginning of glaucoma- uses drops    Hyperlipidemia    Hypertension    Myocardial infarct ([place]) 1992   inferior posterior- discovered in 1992-? when happened    Obesity    Radiculopathy    Shortness of breath dyspnea    with exertion -     Past Surgical History:  Procedure Laterality Date   [place]  05/2004   SHOWING 100% OCCLUDED DISTAL  RIGHT CORONARY WITH  LEFT  TO RIGHT COLLATERALS, AS WELL AS ANTEGRADE FLOW, NORMAL LEFT MAIN, 50% NARROWING IN LEFT CIRCUMFLEX  WITH IRREGULARITIES IN THE LAD   CARDIAC CATHETERIZATION N/A 02/12/2016   Procedure: Left Heart Cath and Coronary Angiography;  Surgeon: Leonie Man, MD;  Location: [place] [facility];  Service: Cardiovascular;  Laterality: N/A;   CARDIOVASCULAR STRESS TEST  02/26/2010   EF 65%, SMALL AREA OF MILD INFARCT AND POSSIBLE PER-INFARCT ISCHEMIA IN THE INFEROBASAL WALL   COLONOSCOPY     CORONARY ANGIOPLASTY     RIGHT CORONARY WERE UNSUCCESSFUL   CORONARY ARTERY BYPASS GRAFT N/A 02/25/2016   Procedure: CORONARY ARTERY BYPASS GRAFTING (CABG) times four using the left internal mammary artery and the right saphenus vein ;  Surgeon: [name], MD;  Location: [place];  Service: Open Heart Surgery;  Laterality: N/A;   HERNIA REPAIR  1960   right hernia repair   INTRAOPERATIVE TRANSESOPHAGEAL ECHOCARDIOGRAM N/A 02/25/2016   Procedure: INTRAOPERATIVE TRANSESOPHAGEAL ECHOCARDIOGRAM;  Surgeon: [name], MD;  Location: [place];  Service: Open Heart Surgery;  Laterality: N/A;   UMBILICAL HERNIA REPAIR     US ECHOCARDIOGRAPHY  01/03/2007   EF 55-60%    Social History   Tobacco Use   Smoking status: Former    Packs/day: 1.00    Years: 25.00    Total pack years: 25.00    Types: Cigarettes    Quit date: 07/23/1981    Years since quitting: 40.8   Smokeless tobacco: Never  Vaping Use   Vaping Use: Never used  Substance Use Topics   Alcohol use: Yes    Alcohol/week: 7.0 standard drinks of alcohol    Types: 7 Glasses of wine per week    Comment: glass of wine a night    Drug use: No    Family History  Problem Relation Age of Onset   Congestive Heart Failure Mother    Diabetes Mother    Stroke Father    Hypertension Neg Hx    Colon cancer Neg Hx    Colon polyps Neg Hx    Esophageal cancer Neg Hx    Rectal cancer Neg Hx    Stomach cancer Neg Hx     Neuropathy Neg Hx     No Known Allergies  Medication list has been reviewed and updated.  Current Outpatient Medications on File Prior to Visit  Medication Sig Dispense Refill   amLODipine (NORVASC) 10 MG tablet TAKE ONE TABLET BY MOUTH DAILY (Patient taking differently: 5 mg.) 90 tablet 1   aspirin 81 MG tablet Take 81 mg by mouth daily.     Coenzyme Q10 100 MG TABS Take 100 mg by mouth daily.      dicyclomine (BENTYL) 10 MG capsule Take 1 pill by mouth every am. May take additional 3 times daily prn 50 capsule 3   fish oil-omega-3 fatty acids 1000 MG capsule Take 1,400 mg by mouth daily.  gabapentin (NEURONTIN) 300 MG capsule Take 300 mg by mouth am, 300 mg by mouth noon as needed, 600 mg by mouth at bedtime 360 capsule 1   Glucosamine-Chondroitin-MSM 500-200-150 MG TABS Take by mouth.     hydrochlorothiazide (HYDRODIURIL) 25 MG tablet Take 1 tablet (25 mg total) by mouth daily. 90 tablet 3   latanoprost (XALATAN) 0.005 % ophthalmic solution Place 1 drop into both eyes daily.     losartan (COZAAR) 100 MG tablet Take 1 tablet (100 mg total) by mouth daily. 90 tablet 0   meloxicam (MOBIC) 15 MG tablet Take 1 tablet by mouth with food for pain and inflammation.     Plant Sterols and Stanols 450 MG TABS Take 900 mg by mouth daily.     No current facility-administered medications on file prior to visit.    Review of Systems:  As per HPI- otherwise negative.   Physical Examination: There were no vitals filed for this visit. There were no vitals filed for this visit. There is no height or weight on file to calculate BMI. Ideal Body Weight:    Patient observed over video monitor.  He looks well, no shortness of breath or distress is noted  Assessment and Plan: Dyslipidemia  Pain in both lower extremities  Patient seen today for follow-up, virtual visit History of CAD and status post CABG in 2017 He was having apparent myalgias due to Crestor, stopped taking this about a  month ago.  He does note some improvement in his leg symptoms  I sent a message to his cardiologist,?  PCSK9 versus trying a lower potency statin  Signed Lamar Blinks, MD

## 2022-05-27 NOTE — Addendum Note (Signed)
Addended by: Sumner Boast on: 05/27/2022 10:15 AM   Modules accepted: Orders

## 2022-05-30 ENCOUNTER — Encounter: Payer: Self-pay | Admitting: Family Medicine

## 2022-05-30 ENCOUNTER — Telehealth (INDEPENDENT_AMBULATORY_CARE_PROVIDER_SITE_OTHER): Payer: PPO | Admitting: Family Medicine

## 2022-05-30 VITALS — BP 111/60 | HR 75 | Temp 98.1°F | Ht 62.0 in | Wt 177.0 lb

## 2022-05-30 DIAGNOSIS — M79604 Pain in right leg: Secondary | ICD-10-CM

## 2022-05-30 DIAGNOSIS — E785 Hyperlipidemia, unspecified: Secondary | ICD-10-CM | POA: Diagnosis not present

## 2022-05-30 DIAGNOSIS — M79605 Pain in left leg: Secondary | ICD-10-CM | POA: Diagnosis not present

## 2022-06-01 ENCOUNTER — Ambulatory Visit (INDEPENDENT_AMBULATORY_CARE_PROVIDER_SITE_OTHER): Payer: PPO

## 2022-06-01 ENCOUNTER — Ambulatory Visit
Admission: EM | Admit: 2022-06-01 | Discharge: 2022-06-01 | Disposition: A | Discharge status: Home / Self Care | Payer: PPO | Attending: Urgent Care | Admitting: Urgent Care

## 2022-06-01 DIAGNOSIS — M25571 Pain in right ankle and joints of right foot: Secondary | ICD-10-CM

## 2022-06-01 DIAGNOSIS — M25471 Effusion, right ankle: Secondary | ICD-10-CM | POA: Diagnosis not present

## 2022-06-01 DIAGNOSIS — S82831A Other fracture of upper and lower end of right fibula, initial encounter for closed fracture: Secondary | ICD-10-CM

## 2022-06-01 MED ORDER — ACETAMINOPHEN 325 MG PO TABS: 650.0000 mg | ORAL_TABLET | Freq: Four times a day (QID) | ORAL | 0 refills | Status: DC | PRN

## 2022-06-01 MED ORDER — HYDROCODONE-ACETAMINOPHEN 5-325 MG PO TABS: 1.0000 | ORAL_TABLET | Freq: Four times a day (QID) | ORAL | 0 refills | Status: DC | PRN

## 2022-06-01 NOTE — ED Provider Notes (Signed)
[facility]  Note:  This document was prepared using Systems analyst and may include unintentional dictation errors.  MRN: [id number] DOB: 1941-02-17  Subjective:   Marian Grandt is a 81 y.o. male presenting for 1 day history of acute onset persistent right ankle pain with swelling.  Patient unfortunately fell causing him to twist his ankle in an awkward way.  Has been able to bear weight but wants to make sure there is no fracture.  No current facility-administered medications for this encounter.  Current Outpatient Medications:    amLODipine (NORVASC) 10 MG tablet, TAKE ONE TABLET BY MOUTH DAILY (Patient taking differently: 5 mg.), Disp: 90 tablet, Rfl: 1   aspirin 81 MG tablet, Take 81 mg by mouth daily., Disp: , Rfl:    Coenzyme Q10 100 MG TABS, Take 100 mg by mouth daily. , Disp: , Rfl:    dicyclomine (BENTYL) 10 MG capsule, Take 1 pill by mouth every am. May take additional 3 times daily prn, Disp: 50 capsule, Rfl: 3   fish oil-omega-3 fatty acids 1000 MG capsule, Take 1,400 mg by mouth daily., Disp: , Rfl:    gabapentin (NEURONTIN) 300 MG capsule, Take 300 mg by mouth am, 300 mg by mouth noon as needed, 600 mg by mouth at bedtime, Disp: 360 capsule, Rfl: 1   Glucosamine-Chondroitin-MSM 500-200-150 MG TABS, Take by mouth., Disp: , Rfl:    hydrochlorothiazide (HYDRODIURIL) 25 MG tablet, Take 1 tablet (25 mg total) by mouth daily., Disp: 90 tablet, Rfl: 3   latanoprost (XALATAN) 0.005 % ophthalmic solution, Place 1 drop into both eyes daily., Disp: , Rfl:    losartan (COZAAR) 100 MG tablet, Take 1 tablet (100 mg total) by mouth daily., Disp: 90 tablet, Rfl: 0   meloxicam (MOBIC) 15 MG tablet, Take 1 tablet by mouth with food for pain and inflammation., Disp: , Rfl:    Plant Sterols and Stanols 450 MG TABS, Take 900 mg by mouth daily., Disp: , Rfl:    No Known Allergies  Past Medical History:  Diagnosis Date   Arthritis    BPH (benign  prostatic hyperplasia)    Bunion    Callus    Carotid artery occlusion    Coronary artery disease    left main 60% stenosis. The LAD had a proximal 60-70% stenosis. There was mid 40% stenosis. Diagonal is moderate size with 40% stenosis. The circumflex system included a large OM with 60% ostial stenosis the RCA was occluded before the PDA. The EF was 65%.  2014   Glaucoma    beginning of glaucoma- uses drops    Hyperlipidemia    Hypertension    Myocardial infarct ([place]) 1992   inferior posterior- discovered in 1992-? when happened    Obesity    Radiculopathy    Shortness of breath dyspnea    with exertion -      Past Surgical History:  Procedure Laterality Date   [place]  05/2004   SHOWING 100% OCCLUDED DISTAL RIGHT CORONARY WITH  LEFT  TO RIGHT COLLATERALS, AS WELL AS ANTEGRADE FLOW, NORMAL LEFT MAIN, 50% NARROWING IN LEFT CIRCUMFLEX  WITH IRREGULARITIES IN THE LAD   CARDIAC CATHETERIZATION N/A 02/12/2016   Procedure: Left Heart Cath and Coronary Angiography;  Surgeon: Leonie Man, MD;  Location: [place] [facility];  Service: Cardiovascular;  Laterality: N/A;   CARDIOVASCULAR STRESS TEST  02/26/2010   EF 65%, SMALL AREA OF MILD INFARCT  AND POSSIBLE PER-INFARCT ISCHEMIA IN THE INFEROBASAL WALL   COLONOSCOPY     CORONARY ANGIOPLASTY     RIGHT CORONARY WERE UNSUCCESSFUL   CORONARY ARTERY BYPASS GRAFT N/A 02/25/2016   Procedure: CORONARY ARTERY BYPASS GRAFTING (CABG) times four using the left internal mammary artery and the right saphenus vein ;  Surgeon: [name], MD;  Location: [facility];  Service: Open Heart Surgery;  Laterality: N/A;   HERNIA REPAIR  1960   right hernia repair   INTRAOPERATIVE TRANSESOPHAGEAL ECHOCARDIOGRAM N/A 02/25/2016   Procedure: INTRAOPERATIVE TRANSESOPHAGEAL ECHOCARDIOGRAM;  Surgeon: [name], MD;  Location: [facility];  Service: Open Heart Surgery;  Laterality: N/A;   UMBILICAL HERNIA REPAIR     US  ECHOCARDIOGRAPHY  01/03/2007   EF 55-60%    Family History  Problem Relation Age of Onset   Congestive Heart Failure Mother    Diabetes Mother    Stroke Father    Hypertension Neg Hx    Colon cancer Neg Hx    Colon polyps Neg Hx    Esophageal cancer Neg Hx    Rectal cancer Neg Hx    Stomach cancer Neg Hx    Neuropathy Neg Hx     Social History   Tobacco Use   Smoking status: Former    Packs/day: 1.00    Years: 25.00    Total pack years: 25.00    Types: Cigarettes    Quit date: 07/23/1981    Years since quitting: 40.8   Smokeless tobacco: Never  Vaping Use   Vaping Use: Never used  Substance Use Topics   Alcohol use: Yes    Alcohol/week: 7.0 standard drinks of alcohol    Types: 7 Glasses of wine per week    Comment: glass of wine a night    Drug use: No    ROS   Objective:   Vitals: BP (!) 122/52 (BP Location: Right Arm)   Pulse 69   Temp 97.8 F (36.6 C) (Oral)   Resp 16   SpO2 95%   Physical Exam Constitutional:      General: He is not in acute distress.    Appearance: Normal appearance. He is well-developed and normal weight. He is not ill-appearing, toxic-appearing or diaphoretic.  HENT:     Head: Normocephalic and atraumatic.     Right Ear: External ear normal.     Left Ear: External ear normal.     Nose: Nose normal.     Mouth/Throat:     Pharynx: Oropharynx is clear.  Eyes:     General: No scleral icterus.       Right eye: No discharge.        Left eye: No discharge.     Extraocular Movements: Extraocular movements intact.  Cardiovascular:     Rate and Rhythm: Normal rate.  Pulmonary:     Effort: Pulmonary effort is normal.  Musculoskeletal:     Cervical back: Normal range of motion.     Right ankle: Swelling present. No deformity, ecchymosis or lacerations. Tenderness present over the lateral malleolus, ATF ligament and AITF ligament. No medial malleolus, CF ligament, posterior TF ligament, base of 5th metatarsal or proximal fibula  tenderness. Decreased range of motion.     Right Achilles Tendon: No tenderness or defects. Thompson's test negative.  Neurological:     Mental Status: He is alert and oriented to person, place, and time.  Psychiatric:        Mood and Affect: Mood normal.  Behavior: Behavior normal.        Thought Content: Thought content normal.        Judgment: Judgment normal.    DG Ankle Complete Right  Result Date: 06/01/2022 CLINICAL DATA:  Twisted ankle EXAM: RIGHT ANKLE - COMPLETE 3+ VIEW COMPARISON:  None Available. FINDINGS: There is an acute oblique fracture through the distal fibula at and above the level of the ankle mortise. Fracture fragments are distracted 5 mm. There is no significant angulation. There is no dislocation. Joint spaces are well maintained. There is marked lateral soft tissue swelling. IMPRESSION: Distal fibular fracture. Electronically Signed   By: Ronney Asters M.D.   On: 06/01/2022 15:06    Patient placed into a posterior and stirrup short leg splint.  Provided with crutches.  Assessment and Plan :   I have reviewed the PDMP during this encounter.  1. Closed fracture of distal end of right fibula, unspecified fracture morphology, initial encounter   2. Acute right ankle pain   3. Right ankle swelling     Patient splinted as above.  Emphasized need to wear it at all times, use crutches to ambulate.  Use Tylenol for regular pain control, hydrocodone for breakthrough pain.  Follow-up with an orthopedist as listed. Counseled patient on potential for adverse effects with medications prescribed/recommended today, ER and return-to-clinic precautions discussed, patient verbalized understanding.    Jaynee Eagles, [place] 06/01/22 2229

## 2022-06-01 NOTE — Discharge Instructions (Addendum)
Wear the splint at all times. Use crutches to move. Follow up with the orthopedist. Please schedule Tylenol at 500 mg - 650 mg once every 6 hours as needed for aches and pains.  If you still have pain despite taking Tylenol regularly, this is breakthrough pain.  You can use hydrocodone once every 6 hours for this.  Once your pain is better controlled, switch back to just Tylenol.  If you do not need hydrocodone for severe pain then just stick with plain Tylenol.  Follow up with one of the orthopedic practices listed below.

## 2022-06-01 NOTE — ED Triage Notes (Signed)
Pt states he slipped and twisted his right ankle. States he has been icing it.

## 2022-06-02 ENCOUNTER — Encounter: Payer: Self-pay | Admitting: Cardiology

## 2022-06-02 ENCOUNTER — Encounter: Payer: Self-pay | Admitting: Family Medicine

## 2022-06-02 ENCOUNTER — Other Ambulatory Visit: Payer: Self-pay | Admitting: Orthopaedic Surgery

## 2022-06-02 DIAGNOSIS — M25571 Pain in right ankle and joints of right foot: Secondary | ICD-10-CM | POA: Diagnosis not present

## 2022-06-02 DIAGNOSIS — E785 Hyperlipidemia, unspecified: Secondary | ICD-10-CM

## 2022-06-02 DIAGNOSIS — S8264XA Nondisplaced fracture of lateral malleolus of right fibula, initial encounter for closed fracture: Secondary | ICD-10-CM | POA: Diagnosis not present

## 2022-06-03 ENCOUNTER — Ambulatory Visit
Admission: RE | Admit: 2022-06-03 | Discharge: 2022-06-03 | Disposition: A | Discharge status: Home / Self Care | Payer: PPO | Source: Ambulatory Visit | Attending: Orthopaedic Surgery | Admitting: Orthopaedic Surgery

## 2022-06-03 DIAGNOSIS — S82431A Displaced oblique fracture of shaft of right fibula, initial encounter for closed fracture: Secondary | ICD-10-CM | POA: Diagnosis not present

## 2022-06-03 DIAGNOSIS — M7989 Other specified soft tissue disorders: Secondary | ICD-10-CM | POA: Diagnosis not present

## 2022-06-03 DIAGNOSIS — M25571 Pain in right ankle and joints of right foot: Secondary | ICD-10-CM

## 2022-06-03 DIAGNOSIS — S82831A Other fracture of upper and lower end of right fibula, initial encounter for closed fracture: Secondary | ICD-10-CM | POA: Diagnosis not present

## 2022-06-06 MED ORDER — PRAVASTATIN SODIUM 80 MG PO TABS
80.0000 mg | ORAL_TABLET | Freq: Every day | ORAL | 3 refills | Status: DC
  Filled 2022-07-22 – 2022-08-24 (×2): qty 90, 90d supply, fill #0
  Filled 2022-11-26: qty 90, 90d supply, fill #1
  Filled 2023-02-22: qty 90, 90d supply, fill #2

## 2022-06-06 NOTE — Addendum Note (Signed)
Addended by: Lamar Blinks C on: 06/06/2022 12:40 PM   Modules accepted: Orders

## 2022-06-07 DIAGNOSIS — S8264XA Nondisplaced fracture of lateral malleolus of right fibula, initial encounter for closed fracture: Secondary | ICD-10-CM | POA: Diagnosis not present

## 2022-06-08 ENCOUNTER — Ambulatory Visit: Payer: PPO | Admitting: Physical Therapy

## 2022-06-14 DIAGNOSIS — S8264XA Nondisplaced fracture of lateral malleolus of right fibula, initial encounter for closed fracture: Secondary | ICD-10-CM | POA: Diagnosis not present

## 2022-06-20 ENCOUNTER — Ambulatory Visit: Payer: PPO | Attending: Neurology | Admitting: Physical Therapy

## 2022-06-23 ENCOUNTER — Ambulatory Visit: Payer: PPO | Admitting: Physical Therapy

## 2022-06-24 DIAGNOSIS — S8264XA Nondisplaced fracture of lateral malleolus of right fibula, initial encounter for closed fracture: Secondary | ICD-10-CM | POA: Diagnosis not present

## 2022-06-27 ENCOUNTER — Encounter: Payer: Self-pay | Admitting: Neurology

## 2022-06-27 ENCOUNTER — Encounter: Payer: Self-pay | Admitting: Family Medicine

## 2022-06-27 DIAGNOSIS — E785 Hyperlipidemia, unspecified: Secondary | ICD-10-CM

## 2022-06-27 DIAGNOSIS — Z5181 Encounter for therapeutic drug level monitoring: Secondary | ICD-10-CM

## 2022-06-27 DIAGNOSIS — I10 Essential (primary) hypertension: Secondary | ICD-10-CM

## 2022-06-29 ENCOUNTER — Ambulatory Visit (INDEPENDENT_AMBULATORY_CARE_PROVIDER_SITE_OTHER): Payer: PPO | Admitting: Pharmacist

## 2022-06-29 DIAGNOSIS — Z79899 Other long term (current) drug therapy: Secondary | ICD-10-CM

## 2022-06-29 NOTE — Progress Notes (Signed)
Pharmacy Note  06/29/2022 Name: Curtis Tyler MRN: [id number] DOB: [birth year]  Subjective: Curtis Tyler is a 81 y.o. year old male who is a primary care patient of Copland, Gay Filler, MD. Clinical Pharmacist Practitioner referral was placed to assist with medication management.    Engaged with patient by telephone for initial visit today.  Patient wanted to discuss possible mail order options for his and his wife's medications. They are currently using [facility] and they love the pharmacist but they would prefer to not have to go into pharmacy to pick up medications.  [name] has spoken with clinical pharmacist Curtis Tyler in the past but he was not sure that he wanted to change his pharmacy at the time.   Objective: Review of patient status, including review of consultants reports, laboratory and other test data, was performed as part of comprehensive evaluation and provision of chronic care management services.   Lab Results  Component Value Date   CREATININE 1.11 12/29/2021   CREATININE 0.97 12/24/2021   CREATININE 1.08 08/16/2021    Lab Results  Component Value Date   HGBA1C 6.4 12/29/2021       Component Value Date/Time   CHOL 153 12/29/2021 0949   TRIG 86.0 12/29/2021 0949   HDL 66.50 12/29/2021 0949   CHOLHDL 2 12/29/2021 0949   VLDL 17.2 12/29/2021 0949   LDLCALC 69 12/29/2021 0949     Clinical ASCVD: Yes  The ASCVD Risk score (Arnett DK, et al., 2019) failed to calculate for the following reasons:   The 2019 ASCVD risk score is only valid for ages 68 to 28   The patient has a prior MI or stroke diagnosis    BP Readings from Last 3 Encounters:  06/01/22 (!) 122/52  05/30/22 111/60  05/11/22 (!) 165/81     No Known Allergies  Medications Reviewed Today     Reviewed by Curtis Tyler (Pharmacist) on 06/29/22 at [facility] List Status: <None>   Medication Order Taking? Sig Documenting Provider Last Dose Status Informant  acetaminophen  (TYLENOL) 325 MG tablet [id number] Yes Take 2 tablets (650 mg total) by mouth every 6 (six) hours as needed for moderate pain. Curtis Eagles, PA-C Taking Active   amLODipine (NORVASC) 10 MG tablet [id number] Yes TAKE ONE TABLET BY MOUTH DAILY  Patient taking differently: 5 mg.   Copland, Gay Filler, MD Taking Active   aspirin 81 MG tablet [id number] Yes Take 81 mg by mouth daily. [provider] Taking Active Self  Coenzyme Q10 100 MG TABS [id number] Yes Take 100 mg by mouth daily.  [provider] Taking Active   dicyclomine (BENTYL) 10 MG capsule [id number] No Take 1 pill by mouth every am. May take additional 3 times daily prn  Patient not taking: Reported on 06/29/2022   Curtis Champagne, PA-C Not Taking Active   fish oil-omega-3 fatty acids 1000 MG capsule [id number] Yes Take 1,400 mg by mouth daily. [provider] Taking Active Self  gabapentin (NEURONTIN) 300 MG capsule [id number] Yes Take 300 mg by mouth am, 300 mg by mouth noon as needed, 600 mg by mouth at bedtime Copland, Gay Filler, MD Taking Active   Glucosamine-Chondroitin-MSM 500-200-150 MG TABS [id number] Yes Take by mouth. [provider] Taking Active   hydrochlorothiazide (HYDRODIURIL) 25 MG tablet [id number] Yes Take 1 tablet (25 mg total) by mouth daily. Copland, Gay Filler, MD Taking Active   HYDROcodone-acetaminophen (NORCO/VICODIN) 5-325 MG tablet [id number]  Take 1 tablet by  mouth every 6 (six) hours as needed for severe pain. Curtis Eagles, PA-C  Active   latanoprost (XALATAN) 0.005 % ophthalmic solution [id number] Yes Place 1 drop into both eyes daily. [provider] Taking Active Self           Med Note Curtis Tyler, Curtis Tyler   Wed Sep 12, 2018  2:42 PM)    losartan (COZAAR) 100 MG tablet [id number] Yes Take 1 tablet (100 mg total) by mouth daily. Copland, Gay Filler, MD Taking Active   meloxicam (MOBIC) 15 MG tablet [id number]  Take 1 tablet by mouth with food for pain and inflammation. [provider]  Active   Plant Sterols and Stanols 450 MG TABS [id number] Yes Take 900 mg by mouth daily. [provider] Taking Active Self  pravastatin (PRAVACHOL) 80 MG tablet [id number] Yes Take 1 tablet (80 mg total) by mouth daily. Copland, Gay Filler, MD Taking Active   traZODone (DESYREL) 50 MG tablet [id number] Yes Take 50 mg by mouth at bedtime. [provider] Taking Active             Patient Active Problem List   Diagnosis Date Noted   LUQ pain 09/15/2021   Nausea without vomiting 09/15/2021   Educated about COVID-19 virus infection 05/11/2020   Lung nodule 04/10/2020   Elevated bilirubin 05/21/2019   Pre-diabetes 05/20/2019   Dyslipidemia 05/03/2019   Bradycardia 09/12/2018   Plantar porokeratosis, acquired 05/16/2018   Abnormal stress ECG with treadmill 02/12/2016   Colonic polyp 09/21/2015   Left lower quadrant pain 09/03/2015   Cellulitis of right lower extremity 05/14/2015   Need for immunization against influenza 05/14/2015   Bilateral low back pain with sciatica 09/03/2014   Hip pain, bilateral 09/03/2014   Bilateral carotid artery stenosis 07/21/2014   Myalgia 07/21/2014   Acute pain of left knee 07/15/2014   Benign prostatic hyperplasia with lower urinary tract symptoms 07/15/2014   Effusion of left knee 07/15/2014   History of elevated prostate specific antigen (PSA) 07/15/2014   Hypertrophy of nasal turbinates 07/15/2014   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  Patient affirms current coverage meets  needs.   Assessment / Plan:  Medication Management  Reviewed medication list and updated.  Discussed options of home delivery or picking up prescription at [facility]. Will also have packaging option in future if he or his wife would like this option. [facility] mail order / home delivery will also be preferred option for [facility] in 2024.  Use Medicare.gov to review medication costs at [facility] and [facility]  -should have similar cost at whichever pharmacy he prefers.  Patient to discuss with his wife and will get back to me.  Will provide assistance as needed to transition over if she changes pharmacy.   Follow Up:  Telephone follow up appointment with care management team member scheduled for:  Not scheduled but patient will discuss options with his wife and call me back. Will check back with them in 2 weeks if he hasn't call by then.    Curtis Tyler, PharmD Clinical Pharmacist [facility]  -  [facility] [employer] [phone]

## 2022-07-01 DIAGNOSIS — S8264XA Nondisplaced fracture of lateral malleolus of right fibula, initial encounter for closed fracture: Secondary | ICD-10-CM | POA: Diagnosis not present

## 2022-07-04 DIAGNOSIS — S8264XA Nondisplaced fracture of lateral malleolus of right fibula, initial encounter for closed fracture: Secondary | ICD-10-CM | POA: Diagnosis not present

## 2022-07-08 DIAGNOSIS — S8264XA Nondisplaced fracture of lateral malleolus of right fibula, initial encounter for closed fracture: Secondary | ICD-10-CM | POA: Diagnosis not present

## 2022-07-11 DIAGNOSIS — S8264XA Nondisplaced fracture of lateral malleolus of right fibula, initial encounter for closed fracture: Secondary | ICD-10-CM | POA: Diagnosis not present

## 2022-07-14 ENCOUNTER — Telehealth: Payer: Self-pay | Admitting: Family Medicine

## 2022-07-14 NOTE — Telephone Encounter (Signed)
Patient called to speak with Tammy about transferring all his and his wife's prescriptions to the pharmacy downstairs. He asked for a call back when she has time to discuss.

## 2022-07-15 DIAGNOSIS — S8264XA Nondisplaced fracture of lateral malleolus of right fibula, initial encounter for closed fracture: Secondary | ICD-10-CM | POA: Diagnosis not present

## 2022-07-18 DIAGNOSIS — S8264XA Nondisplaced fracture of lateral malleolus of right fibula, initial encounter for closed fracture: Secondary | ICD-10-CM | POA: Diagnosis not present

## 2022-07-19 ENCOUNTER — Other Ambulatory Visit (HOSPITAL_BASED_OUTPATIENT_CLINIC_OR_DEPARTMENT_OTHER): Payer: Self-pay

## 2022-07-19 ENCOUNTER — Other Ambulatory Visit (HOSPITAL_COMMUNITY): Payer: Self-pay

## 2022-07-19 MED ORDER — AMLODIPINE BESYLATE 5 MG PO TABS
5.0000 mg | ORAL_TABLET | Freq: Every day | ORAL | 1 refills | Status: DC
  Filled 2022-07-19 (×2): qty 90, 90d supply, fill #0
  Filled 2022-10-13: qty 90, 90d supply, fill #1

## 2022-07-19 NOTE — Telephone Encounter (Signed)
Patient called. Discussed mail order / deliver options with [facility]. Patient would like to transfer medications from [facility] on [address] to [facility]  He states he only needed amlodipine filled now (want '5mg'$  tablets).  All other meds will be profiled until patient needs.

## 2022-07-22 ENCOUNTER — Other Ambulatory Visit (HOSPITAL_COMMUNITY): Payer: Self-pay

## 2022-07-22 MED ORDER — EZETIMIBE 10 MG PO TABS
10.0000 mg | ORAL_TABLET | Freq: Every day | ORAL | 1 refills | Status: DC
  Filled 2022-07-22 – 2022-10-13 (×2): qty 90, 90d supply, fill #0

## 2022-07-22 MED ORDER — AMLODIPINE BESYLATE 10 MG PO TABS
10.0000 mg | ORAL_TABLET | Freq: Every day | ORAL | 1 refills | Status: DC
  Filled 2022-07-22: qty 90, 90d supply, fill #0

## 2022-07-22 MED ORDER — TRAZODONE HCL 50 MG PO TABS
25.0000 mg | ORAL_TABLET | Freq: Every evening | ORAL | 3 refills | Status: DC | PRN
  Filled 2022-07-22 – 2022-08-10 (×2): qty 30, 30d supply, fill #0

## 2022-07-22 MED ORDER — LATANOPROST 0.005 % OP SOLN
1.0000 [drp] | Freq: Every evening | OPHTHALMIC | 3 refills | Status: DC
  Filled 2022-07-22: qty 7.5, 56d supply, fill #0

## 2022-07-25 ENCOUNTER — Other Ambulatory Visit (HOSPITAL_COMMUNITY): Payer: Self-pay

## 2022-07-25 DIAGNOSIS — R972 Elevated prostate specific antigen [PSA]: Secondary | ICD-10-CM | POA: Diagnosis not present

## 2022-07-26 ENCOUNTER — Other Ambulatory Visit (HOSPITAL_COMMUNITY): Payer: Self-pay

## 2022-07-26 DIAGNOSIS — S8264XA Nondisplaced fracture of lateral malleolus of right fibula, initial encounter for closed fracture: Secondary | ICD-10-CM | POA: Diagnosis not present

## 2022-07-26 MED ORDER — MELOXICAM 15 MG PO TABS
15.0000 mg | ORAL_TABLET | Freq: Every day | ORAL | 3 refills | Status: DC
  Filled 2022-07-26: qty 30, 30d supply, fill #0

## 2022-07-29 DIAGNOSIS — N281 Cyst of kidney, acquired: Secondary | ICD-10-CM | POA: Diagnosis not present

## 2022-07-29 DIAGNOSIS — N400 Enlarged prostate without lower urinary tract symptoms: Secondary | ICD-10-CM | POA: Diagnosis not present

## 2022-08-01 DIAGNOSIS — S8264XA Nondisplaced fracture of lateral malleolus of right fibula, initial encounter for closed fracture: Secondary | ICD-10-CM | POA: Diagnosis not present

## 2022-08-03 ENCOUNTER — Other Ambulatory Visit: Payer: Self-pay | Admitting: Family Medicine

## 2022-08-03 ENCOUNTER — Other Ambulatory Visit (HOSPITAL_COMMUNITY): Payer: Self-pay

## 2022-08-03 DIAGNOSIS — I10 Essential (primary) hypertension: Secondary | ICD-10-CM

## 2022-08-04 ENCOUNTER — Telehealth: Payer: Self-pay | Admitting: Pharmacist

## 2022-08-04 ENCOUNTER — Other Ambulatory Visit (HOSPITAL_COMMUNITY): Payer: Self-pay

## 2022-08-04 MED ORDER — LOSARTAN POTASSIUM 100 MG PO TABS
100.0000 mg | ORAL_TABLET | Freq: Every day | ORAL | 1 refills | Status: DC
  Filled 2022-08-04: qty 90, 90d supply, fill #0
  Filled 2022-11-05: qty 90, 90d supply, fill #1

## 2022-08-04 NOTE — Telephone Encounter (Signed)
Called patient to check to see if all was going well with transition to new pharmacy. Patient reports he has received 1 prescription and has order 2 others. He has had great results with [facility] / mail order prescriptions (getting for himself and his wife Octave [place]) .  Provided with my number if he has any further questions or concerns.

## 2022-08-10 ENCOUNTER — Other Ambulatory Visit (HOSPITAL_COMMUNITY): Payer: Self-pay

## 2022-08-11 ENCOUNTER — Encounter: Payer: Self-pay | Admitting: Family Medicine

## 2022-08-11 ENCOUNTER — Telehealth: Payer: Self-pay | Admitting: Family Medicine

## 2022-08-11 DIAGNOSIS — Z87898 Personal history of other specified conditions: Secondary | ICD-10-CM

## 2022-08-11 DIAGNOSIS — E785 Hyperlipidemia, unspecified: Secondary | ICD-10-CM

## 2022-08-11 DIAGNOSIS — I2510 Atherosclerotic heart disease of native coronary artery without angina pectoris: Secondary | ICD-10-CM

## 2022-08-11 DIAGNOSIS — R7303 Prediabetes: Secondary | ICD-10-CM

## 2022-08-11 DIAGNOSIS — I10 Essential (primary) hypertension: Secondary | ICD-10-CM

## 2022-08-11 NOTE — Telephone Encounter (Signed)
Pt called stating that he would like to have lab work done prior to his appt on 12.20.23 in order to go over those results in the [facility]. Please Advise

## 2022-08-11 NOTE — Telephone Encounter (Signed)
Okay for lab appointment?  

## 2022-08-12 ENCOUNTER — Encounter: Payer: Self-pay | Admitting: Family Medicine

## 2022-08-12 ENCOUNTER — Other Ambulatory Visit (INDEPENDENT_AMBULATORY_CARE_PROVIDER_SITE_OTHER): Payer: PPO

## 2022-08-12 DIAGNOSIS — R7303 Prediabetes: Secondary | ICD-10-CM | POA: Diagnosis not present

## 2022-08-12 DIAGNOSIS — I10 Essential (primary) hypertension: Secondary | ICD-10-CM

## 2022-08-12 DIAGNOSIS — Z87898 Personal history of other specified conditions: Secondary | ICD-10-CM

## 2022-08-12 DIAGNOSIS — E785 Hyperlipidemia, unspecified: Secondary | ICD-10-CM | POA: Diagnosis not present

## 2022-08-12 LAB — LIPID PANEL
Cholesterol: 194 mg/dL (ref 0–200)
HDL: 60.6 mg/dL (ref >39.00)
LDL Cholesterol: 100 mg/dL — ABNORMAL HIGH (ref 0–99)
NonHDL: 133.36
Total CHOL/HDL Ratio: 3
Triglycerides: 167 mg/dL — ABNORMAL HIGH (ref 0.0–149.0)
VLDL: 33.4 mg/dL (ref 0.0–40.0)

## 2022-08-12 LAB — CBC
HCT: 46.2 % (ref 39.0–52.0)
Hemoglobin: 15.8 g/dL (ref 13.0–17.0)
MCHC: 34.2 g/dL (ref 30.0–36.0)
MCV: 94 fl (ref 78.0–100.0)
Platelets: 179 10*3/uL (ref 150.0–400.0)
RBC: 4.92 Mil/uL (ref 4.22–5.81)
RDW: 13 % (ref 11.5–15.5)
WBC: 5.8 10*3/uL (ref 4.0–10.5)

## 2022-08-12 LAB — BASIC METABOLIC PANEL
BUN: 26 mg/dL — ABNORMAL HIGH (ref 6–23)
CO2: 32 mEq/L (ref 19–32)
Calcium: 9.4 mg/dL (ref 8.4–10.5)
Chloride: 97 mEq/L (ref 96–112)
Creatinine, Ser: 1.1 mg/dL (ref 0.40–1.50)
GFR: 61.92 mL/min (ref >60.00)
Glucose, Bld: 134 mg/dL — ABNORMAL HIGH (ref 70–99)
Potassium: 3.8 mEq/L (ref 3.5–5.1)
Sodium: 138 mEq/L (ref 135–145)

## 2022-08-12 LAB — HEMOGLOBIN A1C: Hgb A1c MFr Bld: 6.6 % — ABNORMAL HIGH (ref 4.6–6.5)

## 2022-08-12 LAB — PSA: PSA: 4.11 ng/mL — ABNORMAL HIGH (ref 0.10–4.00)

## 2022-08-13 NOTE — Progress Notes (Unsigned)
[place] at [facility] [address], [place], [place] [address] [phone]  Date:  08/17/2022   Name:  Curtis Tyler   DOB:  Dec 29, 1940   MRN:  [id number]  PCP:  [name], MD    Chief Complaint: med check (Concerns/ questions: discuss medications/Flu shot today: received around 05/29/2022- with RSV, and covid  /AWV due)   History of Present Illness:  Curtis Tyler is a 81 y.o. very pleasant male patient who presents with the following:  Following up today, did labs recently and we will go over today history of prediabetes, coronary artery disease status post CABG, hyperlipidemia, hypertension, carotid stenosis, BPH   He did fall in late October and broke his right ankle He was in a fracture boot per [employer] for about 6 weeks and then an ankle brace- he is cleared to get back to his walking routine  He was laid up for several weeks and as such has not been able to exercise like he normally would  Flu vaccine- done Covid booster recommended - done  He was seen by urology just recently- pt states they are watching his PSA but they are not really concerned about his risk of prostate cancer at this point.  Per patient as long as his PSA stays below about 6 they are satisfied.  He has been seeing Dr Diona Fanti  with [facility] but he plans to retire soon   Lab Results  Component Value Date   PSA 4.11 (H) 08/12/2022   PSA 3.46 12/29/2021   PSA 3.81 05/17/2021   He is concerned about his lipids- he was having a lot of aches and pains.  He stopped using his crestor and zetia which did help He is now taking just pravastatin 80 mg and is tolerating it ok  He has some zetia at home and will try adding this back and see how he tolerates this- we can add back to his routine meds if he wishes too  His A1c did come up a bit- however as above he recently had an ankle fracture which limited his mobility and  exercise  Results for orders placed or performed in visit on 08/12/22  PSA  Result Value Ref Range   PSA 4.11 (H) 0.10 - 4.00 ng/mL  Lipid panel  Result Value Ref Range   Cholesterol 194 0 - 200 mg/dL   Triglycerides 167.0 (H) 0.0 - 149.0 mg/dL   HDL 60.60 >39.00 mg/dL   VLDL 33.4 0.0 - 40.0 mg/dL   LDL Cholesterol 100 (H) 0 - 99 mg/dL   Total CHOL/HDL Ratio 3    NonHDL 133.36   Hemoglobin A1c  Result Value Ref Range   Hgb A1c MFr Bld 6.6 (H) 4.6 - 6.5 %  CBC  Result Value Ref Range   WBC 5.8 4.0 - 10.5 K/uL   RBC 4.92 4.22 - 5.81 Mil/uL   Platelets 179.0 150.0 - 400.0 K/uL   Hemoglobin 15.8 13.0 - 17.0 g/dL   HCT 46.2 39.0 - 52.0 %   MCV 94.0 78.0 - 100.0 fl   MCHC 34.2 30.0 - 36.0 g/dL   RDW 13.0 11.5 - 76.7 %  Basic metabolic panel  Result Value Ref Range   Sodium 138 135 - 145 mEq/L   Potassium 3.8 3.5 - 5.1 mEq/L   Chloride 97 96 - 112 mEq/L   CO2 32 19 - 32 mEq/L   Glucose, Bld 134 (H) 70 -  99 mg/dL   BUN 26 (H) 6 - 23 mg/dL   Creatinine, Ser 1.10 0.40 - 1.50 mg/dL   GFR 61.92 >60.00 mL/min   Calcium 9.4 8.4 - 10.5 mg/dL     Patient Active Problem List   Diagnosis Date Noted   LUQ pain 09/15/2021   Educated about COVID-19 virus infection 05/11/2020   Lung nodule 04/10/2020   Elevated bilirubin 05/21/2019   Pre-diabetes 05/20/2019   Dyslipidemia 05/03/2019   Bradycardia 09/12/2018   Plantar porokeratosis, acquired 05/16/2018   Abnormal stress ECG with treadmill 02/12/2016   Colonic polyp 09/21/2015   Left lower quadrant pain 09/03/2015   Cellulitis of right lower extremity 05/14/2015   Need for immunization against influenza 05/14/2015   Bilateral low back pain with sciatica 09/03/2014   Hip pain, bilateral 09/03/2014   Bilateral carotid artery stenosis 07/21/2014   Myalgia 07/21/2014   Acute pain of left knee 07/15/2014   Benign prostatic hyperplasia with lower urinary tract symptoms 07/15/2014   Effusion of left knee 07/15/2014   History of  elevated prostate specific antigen (PSA) 07/15/2014   Hypertrophy of nasal turbinates 07/15/2014   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 The LAD had a proximal 60-70% stenosis. There was mid 40% stenosis. Diagonal is moderate size with 40% stenosis. The circumflex system included a large OM with 60% ostial stenosis the RCA was occluded before the PDA. The EF was 65%.  2014   Glaucoma    beginning of glaucoma- uses drops    Hyperlipidemia    Hypertension    Myocardial infarct ([place]) 1992   inferior posterior- discovered in 1992-? when happened    Obesity    Radiculopathy    Shortness of breath dyspnea    with exertion -     Past Surgical History:  Procedure Laterality Date   [place]  05/2004   SHOWING 100% OCCLUDED DISTAL RIGHT CORONARY WITH  LEFT  TO RIGHT COLLATERALS, AS WELL AS ANTEGRADE FLOW, NORMAL LEFT MAIN, 50% NARROWING IN LEFT CIRCUMFLEX  WITH IRREGULARITIES IN THE LAD   CARDIAC CATHETERIZATION N/A 02/12/2016   Procedure: Left Heart Cath and Coronary Angiography;  Surgeon: Leonie Man, MD;  Location: [facility] [facility];  Service: Cardiovascular;  Laterality: N/A;   CARDIOVASCULAR STRESS TEST  02/26/2010   EF 65%, SMALL AREA OF MILD INFARCT AND POSSIBLE PER-INFARCT ISCHEMIA IN THE INFEROBASAL WALL   COLONOSCOPY     CORONARY ANGIOPLASTY     RIGHT CORONARY  WERE UNSUCCESSFUL   CORONARY ARTERY BYPASS GRAFT N/A 02/25/2016   Procedure: CORONARY ARTERY BYPASS GRAFTING (CABG) times four using the left internal mammary artery and the right saphenus vein ;  Surgeon: [name], MD;  Location: [facility];  Service: Open Heart Surgery;  Laterality: N/A;   HERNIA REPAIR  1960   right hernia repair   INTRAOPERATIVE TRANSESOPHAGEAL ECHOCARDIOGRAM  N/A 02/25/2016   Procedure: INTRAOPERATIVE TRANSESOPHAGEAL ECHOCARDIOGRAM;  Surgeon: [name], MD;  Location: [place];  Service: Open Heart Surgery;  Laterality: N/A;   UMBILICAL HERNIA REPAIR     US ECHOCARDIOGRAPHY  01/03/2007   EF 55-60%    Social History   Tobacco Use   Smoking status: Former    Packs/day: 1.00    Years: 25.00    Total pack years: 25.00    Types: Cigarettes    Quit date: 07/23/1981    Years since quitting: 41.0   Smokeless tobacco: Never  Vaping Use   Vaping Use: Never used  Substance Use Topics   Alcohol use: Yes    Alcohol/week: 7.0 standard drinks of alcohol    Types: 7 Glasses of wine per week    Comment: glass of wine a night    Drug use: No    Family History  Problem Relation Age of Onset   Congestive Heart Failure Mother    Diabetes Mother    Stroke Father    Hypertension Neg Hx    Colon cancer Neg Hx    Colon polyps Neg Hx    Esophageal cancer Neg Hx    Rectal cancer Neg Hx    Stomach cancer Neg Hx    Neuropathy Neg Hx     No Known Allergies  Medication list has been reviewed and updated.  Current Outpatient Medications on File Prior to Visit  Medication Sig Dispense Refill   acetaminophen (TYLENOL) 325 MG tablet Take 2 tablets (650 mg total) by mouth every 6 (six) hours as needed for pain. 30 tablet 0   amLODipine (NORVASC) 5 MG tablet Take 1 tablet (5 mg total) by mouth daily. 90 tablet 1   aspirin 81 MG tablet Take 81 mg by mouth daily.     Coenzyme Q10 100 MG TABS Take 100 mg by mouth daily.      dicyclomine (BENTYL) 10 MG capsule Take 1  capsule (10 mg total) by mouth every morning. May take an additional 3 times daily as needed. 50 capsule 3   ezetimibe (ZETIA) 10 MG tablet Take 1 tablet (10 mg total) by mouth daily. 90 tablet 1   fish oil-omega-3 fatty acids 1000 MG capsule Take 1,400 mg by mouth daily.     gabapentin (NEURONTIN) 300 MG capsule Take 300 mg by mouth am, 300 mg by mouth noon as needed, 600 mg by mouth at bedtime 360 capsule 1   Glucosamine-Chondroitin-MSM 500-200-150 MG TABS Take by mouth.     hydrochlorothiazide (HYDRODIURIL) 25 MG tablet Take 1 tablet (25 mg total) by mouth daily. 90 tablet 3   latanoprost (XALATAN) 0.005 % ophthalmic solution Place 1 drop into both eyes nightly. 7.5 mL 3   losartan (COZAAR) 100 MG tablet Take 1 tablet (100 mg total) by mouth daily. 90 tablet 1   meloxicam (MOBIC) 15 MG tablet Take 1 tablet by mouth with food for pain and inflammation.     Plant Sterols and Stanols 450 MG TABS Take 900 mg by mouth daily.     pravastatin (PRAVACHOL) 80 MG tablet Take 1 tablet (80 mg total) by mouth daily. 90 tablet 3   traZODone (DESYREL) 50 MG tablet Take 0.5-1 tablets (25-50 mg total) by mouth at bedtime as needed for sleep. 30 tablet 3   No current facility-administered medications on file prior to visit.    Review of Systems:  As per HPI- otherwise negative.'  Physical Examination: Vitals:   08/17/22 0840  BP: (!) 142/78  Pulse: 60  Resp: 18  Temp: 97.7 F (36.5 C)  SpO2: 95%   Vitals:   08/17/22 0840  Weight: 180 lb (81.6 kg)  Height: '5\' 2"'$  (1.575 m)   Body mass index is 32.92 kg/m. Ideal Body Weight: Weight in (lb) to have BMI = 25: 136.4  GEN: no acute distress.  Obese, looks well  HEENT: Atraumatic, Normocephalic.  Ears and Nose: No external deformity. CV: RRR, No M/G/R. No JVD. No thrill. No extra heart sounds. PULM: CTA B, no wheezes, crackles, rhonchi. No retractions. No resp. distress. No accessory muscle use. ABD: S, NT, ND, +BS. No rebound. No HSM. EXTR: No  c/c/e PSYCH: Normally interactive. Conversant.    Assessment and Plan: Hyperlipidemia with target LDL less than 70  Essential hypertension  Coronary artery disease involving native coronary artery of native heart without angina pectoris  Pre-diabetes  Following up today, labs were drawn in advance as above.  We discussed these today and I faxed a copy of his most recent PSA to his urologist He would like to try adding ezetimibe back to his pravastatin, if he tolerates this well I am glad to refill ezetimibe. Blood pressure under adequate control, we hope to see more improvement when he gets back to his exercise routine By the same token anticipate his A1c will return to previous baseline and he is able to exercise again Plan to recheck in 6 months assuming all is well [name] also discusses some concerns he has about his wife's memory.  He may schedule them both neurology appointments as this may help her feel more comfortable Signed Lamar Blinks, MD

## 2022-08-16 DIAGNOSIS — S8264XA Nondisplaced fracture of lateral malleolus of right fibula, initial encounter for closed fracture: Secondary | ICD-10-CM | POA: Diagnosis not present

## 2022-08-17 ENCOUNTER — Ambulatory Visit (INDEPENDENT_AMBULATORY_CARE_PROVIDER_SITE_OTHER): Payer: PPO | Admitting: Family Medicine

## 2022-08-17 VITALS — BP 142/78 | HR 60 | Temp 97.7°F | Resp 18 | Ht 62.0 in | Wt 180.0 lb

## 2022-08-17 DIAGNOSIS — I2510 Atherosclerotic heart disease of native coronary artery without angina pectoris: Secondary | ICD-10-CM

## 2022-08-17 DIAGNOSIS — R7303 Prediabetes: Secondary | ICD-10-CM | POA: Diagnosis not present

## 2022-08-17 DIAGNOSIS — I10 Essential (primary) hypertension: Secondary | ICD-10-CM

## 2022-08-17 DIAGNOSIS — E785 Hyperlipidemia, unspecified: Secondary | ICD-10-CM | POA: Diagnosis not present

## 2022-08-17 NOTE — Patient Instructions (Addendum)
Please see me in about 6 months assuming all is well Try adding the zetia back if you like- if you tolerate this well I am glad to refill it.  However, your cholesterol looks pretty good on the pravachol only in my opinion  I suspect your A1c, lipids will also improve as you heal from your ankle fracture and get back into your exercise routine  Let me know if you need anything as far as neurology/ memory evaluation referrals

## 2022-08-24 ENCOUNTER — Other Ambulatory Visit (HOSPITAL_COMMUNITY): Payer: Self-pay

## 2022-08-30 ENCOUNTER — Other Ambulatory Visit (HOSPITAL_COMMUNITY): Payer: Self-pay

## 2022-08-30 ENCOUNTER — Other Ambulatory Visit: Payer: Self-pay

## 2022-09-02 ENCOUNTER — Telehealth: Payer: Self-pay | Admitting: Family Medicine

## 2022-09-02 NOTE — Telephone Encounter (Signed)
As long as patient still has [employer] for 2024 all the community pharmacies with [facility] are considered preferred pharmacies. Most of his medications would be $0 copay.  Tried to call patient back - unable to reach him but left a message on his VM with above information and my CB# [phone] or [phone] incase he has questions.

## 2022-09-02 NOTE — Telephone Encounter (Signed)
Pt called stating that he wanted to see about getting set up with the pharmacy off [address] to have his Rx's delivered to him rather than him going to pick them up but hi insurance stated that that pharmacy is not "preferred" and he would like to know why as the [facility] is preferred. Please Advise.

## 2022-09-07 ENCOUNTER — Telehealth: Payer: Self-pay | Admitting: Family Medicine

## 2022-09-07 ENCOUNTER — Other Ambulatory Visit (HOSPITAL_COMMUNITY): Payer: Self-pay

## 2022-09-07 NOTE — Telephone Encounter (Signed)
Copied from [place] [phone]. Topic: Medicare AWV >> Sep 07, 2022 11:41 AM Gillis Santa wrote: Reason for CRM: LVM PATIENT TO CALL [phone] TO SCHEDULE AWV [place]

## 2022-09-08 DIAGNOSIS — L821 Other seborrheic keratosis: Secondary | ICD-10-CM | POA: Diagnosis not present

## 2022-09-08 DIAGNOSIS — D225 Melanocytic nevi of trunk: Secondary | ICD-10-CM | POA: Diagnosis not present

## 2022-09-08 DIAGNOSIS — L570 Actinic keratosis: Secondary | ICD-10-CM | POA: Diagnosis not present

## 2022-09-08 DIAGNOSIS — L814 Other melanin hyperpigmentation: Secondary | ICD-10-CM | POA: Diagnosis not present

## 2022-09-14 ENCOUNTER — Other Ambulatory Visit (HOSPITAL_COMMUNITY): Payer: Self-pay

## 2022-09-14 ENCOUNTER — Telehealth: Payer: Self-pay | Admitting: Family Medicine

## 2022-09-14 ENCOUNTER — Other Ambulatory Visit: Payer: Self-pay

## 2022-09-14 ENCOUNTER — Other Ambulatory Visit: Payer: Self-pay | Admitting: Family Medicine

## 2022-09-14 MED ORDER — TRAZODONE HCL 50 MG PO TABS
25.0000 mg | ORAL_TABLET | Freq: Every evening | ORAL | 3 refills | Status: DC | PRN
  Filled 2022-09-14: qty 30, 30d supply, fill #0
  Filled 2022-10-12: qty 30, 30d supply, fill #1
  Filled 2022-12-15: qty 30, 30d supply, fill #2
  Filled 2023-01-19 – 2023-04-29 (×2): qty 30, 30d supply, fill #3

## 2022-09-14 NOTE — Telephone Encounter (Signed)
Spoke with patient to r/s his 09/19/22 AWV and he declined to r/s stating he choose not to participate think is a waste of time and money.

## 2022-09-19 ENCOUNTER — Ambulatory Visit: Payer: PPO

## 2022-09-21 ENCOUNTER — Other Ambulatory Visit (HOSPITAL_COMMUNITY): Payer: Self-pay

## 2022-09-26 ENCOUNTER — Other Ambulatory Visit (HOSPITAL_COMMUNITY): Payer: Self-pay

## 2022-09-26 MED ORDER — MELOXICAM 15 MG PO TABS
15.0000 mg | ORAL_TABLET | Freq: Every day | ORAL | 3 refills | Status: DC
  Filled 2022-09-26: qty 30, 30d supply, fill #0
  Filled 2023-01-19 – 2023-02-21 (×2): qty 30, 30d supply, fill #1
  Filled 2023-04-16: qty 30, 30d supply, fill #2
  Filled 2023-05-23: qty 30, 30d supply, fill #3

## 2022-09-27 DIAGNOSIS — S8264XA Nondisplaced fracture of lateral malleolus of right fibula, initial encounter for closed fracture: Secondary | ICD-10-CM | POA: Diagnosis not present

## 2022-09-28 ENCOUNTER — Other Ambulatory Visit (HOSPITAL_COMMUNITY): Payer: Self-pay

## 2022-09-28 ENCOUNTER — Other Ambulatory Visit: Payer: Self-pay

## 2022-09-28 ENCOUNTER — Telehealth: Payer: Self-pay | Admitting: Family Medicine

## 2022-09-28 DIAGNOSIS — H0288B Meibomian gland dysfunction left eye, upper and lower eyelids: Secondary | ICD-10-CM | POA: Diagnosis not present

## 2022-09-28 DIAGNOSIS — M79604 Pain in right leg: Secondary | ICD-10-CM

## 2022-09-28 DIAGNOSIS — H0014 Chalazion left upper eyelid: Secondary | ICD-10-CM | POA: Diagnosis not present

## 2022-09-28 DIAGNOSIS — H0288A Meibomian gland dysfunction right eye, upper and lower eyelids: Secondary | ICD-10-CM | POA: Diagnosis not present

## 2022-09-28 DIAGNOSIS — M79605 Pain in left leg: Secondary | ICD-10-CM

## 2022-09-28 MED ORDER — GABAPENTIN 300 MG PO CAPS
ORAL_CAPSULE | ORAL | 1 refills | Status: DC
  Filled 2022-09-28: qty 360, 90d supply, fill #0
  Filled 2022-12-22: qty 360, 90d supply, fill #1

## 2022-09-28 NOTE — Telephone Encounter (Signed)
Rx sent in and pt notified 

## 2022-09-28 NOTE — Telephone Encounter (Signed)
Medication: gabapentin (NEURONTIN) 300 MG capsule  Has the patient contacted their pharmacy? Yes.     Preferred Pharmacy: *new pharmacy*   [place] [address], [place] [address] Phone: [phone]  Fax: [phone]

## 2022-10-11 ENCOUNTER — Encounter: Payer: Self-pay | Admitting: Physical Therapy

## 2022-10-11 ENCOUNTER — Ambulatory Visit: Payer: PPO | Attending: Orthopaedic Surgery | Admitting: Physical Therapy

## 2022-10-11 DIAGNOSIS — R262 Difficulty in walking, not elsewhere classified: Secondary | ICD-10-CM | POA: Diagnosis not present

## 2022-10-11 DIAGNOSIS — M25571 Pain in right ankle and joints of right foot: Secondary | ICD-10-CM | POA: Diagnosis not present

## 2022-10-11 DIAGNOSIS — M25671 Stiffness of right ankle, not elsewhere classified: Secondary | ICD-10-CM | POA: Diagnosis not present

## 2022-10-11 DIAGNOSIS — M6281 Muscle weakness (generalized): Secondary | ICD-10-CM | POA: Insufficient documentation

## 2022-10-11 NOTE — Therapy (Signed)
OUTPATIENT PHYSICAL THERAPY LOWER EXTREMITY EVALUATION   Patient Name: Lennyn Vasbinder MRN: [id number] DOB:1941/03/16, 82 y.o., male Today's Date: 10/11/2022  END OF SESSION:  PT End of Session - 10/11/22 0757     Visit Number 1    Date for PT Re-Evaluation 01/09/23    Authorization Type HTA    PT Start Time 0758    PT Stop Time 0843    PT Time Calculation (min) 45 min    Activity Tolerance Patient tolerated treatment well    Behavior During Therapy [facility] for tasks assessed/performed             Past Medical History:  Diagnosis Date   Arthritis    BPH (benign prostatic hyperplasia)    Bunion    Callus    Carotid artery occlusion    Coronary artery disease    left main 60% stenosis. The LAD had a proximal 60-70% stenosis. There was mid 40% stenosis. Diagonal is moderate size with 40% stenosis. The circumflex system included a large OM with 60% ostial stenosis the RCA was occluded before the PDA. The EF was 65%.  2014   Glaucoma    beginning of glaucoma- uses drops    Hyperlipidemia    Hypertension    Myocardial infarct ([place]) 1992   inferior posterior- discovered in 1992-? when happened    Obesity    Radiculopathy    Shortness of breath dyspnea    with exertion -    Past Surgical History:  Procedure Laterality Date   [place]  05/2004   SHOWING 100% OCCLUDED DISTAL RIGHT CORONARY WITH  LEFT  TO RIGHT COLLATERALS, AS WELL AS ANTEGRADE FLOW, NORMAL LEFT MAIN, 50% NARROWING IN LEFT CIRCUMFLEX  WITH IRREGULARITIES IN THE LAD   CARDIAC CATHETERIZATION N/A 02/12/2016   Procedure: Left Heart Cath and Coronary Angiography;  Surgeon: Leonie Man, MD;  Location: [facility];  Service: Cardiovascular;  Laterality: N/A;   CARDIOVASCULAR STRESS TEST  02/26/2010   EF 65%, SMALL AREA OF MILD INFARCT AND POSSIBLE PER-INFARCT ISCHEMIA IN THE INFEROBASAL WALL   COLONOSCOPY     CORONARY ANGIOPLASTY     RIGHT CORONARY WERE UNSUCCESSFUL    CORONARY ARTERY BYPASS GRAFT N/A 02/25/2016   Procedure: CORONARY ARTERY BYPASS GRAFTING (CABG) times four using the left internal mammary artery and the right saphenus vein ;  Surgeon: [name], MD;  Location: [facility];  Service: Open Heart Surgery;  Laterality: N/A;   HERNIA REPAIR  1960   right hernia repair   INTRAOPERATIVE TRANSESOPHAGEAL ECHOCARDIOGRAM N/A 02/25/2016   Procedure: INTRAOPERATIVE TRANSESOPHAGEAL ECHOCARDIOGRAM;  Surgeon: [name], MD;  Location: [facility] [place];  Service: Open Heart Surgery;  Laterality: N/A;   UMBILICAL HERNIA REPAIR     US ECHOCARDIOGRAPHY  01/03/2007   EF 55-60%   Patient Active Problem List   Diagnosis Date Noted   LUQ pain 09/15/2021   Educated about COVID-19 virus infection 05/11/2020   Lung nodule 04/10/2020   Elevated bilirubin 05/21/2019   Pre-diabetes 05/20/2019   Dyslipidemia 05/03/2019   Bradycardia 09/12/2018   Plantar porokeratosis, acquired 05/16/2018   Abnormal stress ECG with treadmill 02/12/2016   Colonic polyp 09/21/2015   Left lower quadrant pain 09/03/2015   Cellulitis of right lower extremity 05/14/2015   Need for immunization against influenza 05/14/2015   Bilateral low back pain with sciatica 09/03/2014   Hip pain, bilateral 09/03/2014   Bilateral carotid artery stenosis 07/21/2014   Myalgia  07/21/2014   Acute pain of left knee 07/15/2014   Benign prostatic hyperplasia with lower urinary tract symptoms 07/15/2014   Effusion of left knee 07/15/2014   History of elevated prostate specific antigen (PSA) 07/15/2014   Hypertrophy of nasal turbinates 07/15/2014   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   Incomplete emptying of bladder 07/15/2014   Increased urinary frequency 07/15/2014   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   Chest pain 09/05/2012   Cough 08/06/2011   Coronary artery disease involving native coronary artery without angina pectoris 04/26/2011   Essential hypertension 04/26/2011   Hyperlipidemia with  target LDL less than 70 04/26/2011   Cerebrovascular disease 04/26/2011    PCP: Lorelei Pont, MD  REFERRING PROVIDER: Kathaleen Bury, MD  REFERRING DIAG: right ankle fracture  THERAPY DIAG:  Pain in right ankle and joints of right foot  Stiffness of right ankle, not elsewhere classified  Difficulty in walking, not elsewhere classified  Muscle weakness (generalized)  Rationale for Evaluation and Treatment: Rehabilitation  ONSET DATE: 06/01/22  SUBJECTIVE:   SUBJECTIVE STATEMENT: Patient reports that he fell and broke his right ankle 06/01/22.  He was placed in a boot and non weight bearing for 6 weeks, then 2 weeks int he boot WBAT.    PERTINENT HISTORY: See above PAIN:  Are you having pain? Yes: NPRS scale: 0/10 Pain location: right ankle Pain description: ache/fatigue Aggravating factors: walking, standing  pain a 3/10 Relieving factors: rest  PRECAUTIONS: None  WEIGHT BEARING RESTRICTIONS: No  FALLS:  Has patient fallen in last 6 months? Yes. Number of falls 1  LIVING ENVIRONMENT: Lives with: lives with their family Lives in: House/apartment Stairs: Yes: External: 6 steps; none Has following equipment at home: None  OCCUPATION: retired  PLOF: Independent  PATIENT GOALS: get back to walking and feel good about it, right now I am hesitant  NEXT MD VISIT: 4 weeks  OBJECTIVE:  PATIENT SURVEYS:  FOTO 55  COGNITION: Overall cognitive status: Within functional limits for tasks assessed     SENSATION: WFL  EDEMA:  Mild swelling in the right ankle  MUSCLE LENGTH: Tight calves and HS  POSTURE: rounded shoulders and forward head  PALPATION: Tender in the malleoli  LOWER EXTREMITY ROM:  Active ROM Right eval Left eval  Hip flexion    Hip extension    Hip abduction    Hip adduction    Hip internal rotation    Hip external rotation    Knee flexion    Knee extension    Ankle dorsiflexion 7   Ankle plantarflexion 35   Ankle inversion 15   Ankle  eversion 5    (Blank rows = not tested)  LOWER EXTREMITY MMT:  MMT Right eval Left eval  Hip flexion    Hip extension    Hip abduction    Hip adduction    Hip internal rotation    Hip external rotation    Knee flexion    Knee extension    Ankle dorsiflexion 4-   Ankle plantarflexion 4-   Ankle inversion 3+   Ankle eversion 3+    (Blank rows = not tested)  FUNCTIONAL TESTS:  Timed up and go (TUG): 13 Right SLS 2 seconds  GAIT: Distance walked: 100 feet Assistive device utilized: None Level of assistance: Complete Independence Comments: has a mild antalgic gait on the right with faster walking   TODAY'S TREATMENT:  DATE: see below    PATIENT EDUCATION:  Education details: HEP/POC Person educated: Patient Education method: Consulting civil engineer, Media planner, Verbal cues, and Handouts Education comprehension: verbalized understanding  HOME EXERCISE PROGRAM: Access Code: [email or web address] URL: [email or web address] Date: 10/11/2022 Prepared by: Lum Babe  Exercises - Ankle Dorsiflexion with Resistance  - 2 x daily - 7 x weekly - 2 sets - 10 reps - 2 hold - Ankle Plantar Flexion with Resistance  - 2 x daily - 7 x weekly - 2 sets - 10 reps - 2 hold - Seated Ankle Inversion with Resistance  - 2 x daily - 7 x weekly - 2 sets - 10 reps - 2 hold - Seated Ankle Eversion with Resistance  - 2 x daily - 7 x weekly - 2 sets - 10 reps - 2 hold - Single Leg Stance  - 2 x daily - 7 x weekly - 2 sets - 5 reps - 10 hold - Standing Single Leg Stance with Counter Support  - 2 x daily - 7 x weekly - 2 sets - 5 reps - 10 hold - Sidestepping  - 2 x daily - 7 x weekly - 2 sets - 10 reps - 30 hold - Tandem Walking  - 2 x daily - 7 x weekly - 2 sets - 10 reps - 30 hold  ASSESSMENT:  CLINICAL IMPRESSION: Patient is a 82 y.o. male who was seen today for  physical therapy evaluation and treatment for s/p right ankle fracture, he is overall doing well and reports that he only has some fear and hesitancy but he is overcoming that .  HE had some difficulty with SLS unable to stand > 2 seconds, had hesitancy on the airex with marching.     OBJECTIVE IMPAIRMENTS: Abnormal gait, cardiopulmonary status limiting activity, decreased activity tolerance, decreased balance, decreased coordination, decreased endurance, decreased mobility, difficulty walking, decreased ROM, decreased strength, increased fascial restrictions, increased muscle spasms, impaired flexibility, and pain.   REHAB POTENTIAL: Good  CLINICAL DECISION MAKING: Stable/uncomplicated  EVALUATION COMPLEXITY: Low   GOALS: Goals reviewed with patient? Yes  SHORT TERM GOALS: Target date: 10/25/22 Independent with initial HEP Goal status: met  LONG TERM GOALS: Target date: 01/09/23  Independent with advanced HEP Goal status: INITIAL  2.  Walk 1 mile in 25 minutes Goal status: INITIAL  PLAN:  PT FREQUENCY: 1-2x/week  PT DURATION: 12 weeks  PLANNED INTERVENTIONS: Therapeutic exercises, Therapeutic activity, Neuromuscular re-education, Balance training, Gait training, Patient/Family education, Self Care, Joint mobilization, Joint manipulation, Dry Needling, Electrical stimulation, Cryotherapy, Taping, Vasopneumatic device, and Manual therapy  PLAN FOR NEXT SESSION: we talked about the need for PT and we could do higher level balance activities and he feels he can do on his own, gave HEP, he is to call if needed   Sumner Boast, PT 10/11/2022, 7:58 AM

## 2022-10-12 ENCOUNTER — Other Ambulatory Visit (HOSPITAL_COMMUNITY): Payer: Self-pay

## 2022-10-13 ENCOUNTER — Other Ambulatory Visit: Payer: Self-pay

## 2022-10-13 ENCOUNTER — Other Ambulatory Visit (HOSPITAL_COMMUNITY): Payer: Self-pay

## 2022-10-21 ENCOUNTER — Other Ambulatory Visit: Payer: Self-pay

## 2022-10-21 ENCOUNTER — Other Ambulatory Visit (HOSPITAL_COMMUNITY): Payer: Self-pay

## 2022-10-21 ENCOUNTER — Other Ambulatory Visit: Payer: Self-pay | Admitting: Gastroenterology

## 2022-10-21 MED ORDER — DICYCLOMINE HCL 10 MG PO CAPS
10.0000 mg | ORAL_CAPSULE | Freq: Every morning | ORAL | 3 refills | Status: DC
  Filled 2022-10-21: qty 50, 13d supply, fill #0
  Filled 2023-01-19 – 2023-04-16 (×2): qty 50, 13d supply, fill #1
  Filled 2023-09-14: qty 50, 13d supply, fill #2

## 2022-10-26 ENCOUNTER — Other Ambulatory Visit (HOSPITAL_COMMUNITY): Payer: Self-pay

## 2022-10-26 ENCOUNTER — Telehealth: Payer: Self-pay | Admitting: Family Medicine

## 2022-10-26 ENCOUNTER — Other Ambulatory Visit: Payer: Self-pay

## 2022-10-26 MED ORDER — HYDROCHLOROTHIAZIDE 25 MG PO TABS
25.0000 mg | ORAL_TABLET | Freq: Every day | ORAL | 3 refills | Status: DC
  Filled 2022-10-26: qty 90, 90d supply, fill #0
  Filled 2022-12-22 – 2022-12-23 (×2): qty 90, 90d supply, fill #1
  Filled 2023-04-18: qty 90, 90d supply, fill #2
  Filled 2023-07-31: qty 90, 90d supply, fill #3

## 2022-10-26 NOTE — Telephone Encounter (Signed)
Prescription Request  10/26/2022  Is this a "Controlled Substance" medicine? No  LOV: 08/17/2022  What is the name of the medication or equipment?   hydrochlorothiazide (HYDRODIURIL) 25 MG tablet [id number]   Have you contacted your pharmacy to request a refill? No   Which pharmacy would you like this sent to?   [place] [address] Phone: [phone] Fax: [phone]    Patient notified that their request is being sent to the clinical staff for review and that they should receive a response within 2 business days.   Please advise at Mobile [phone] (mobile)

## 2022-10-26 NOTE — Telephone Encounter (Signed)
Refill has been sent to the desired pharmacy.

## 2022-11-05 ENCOUNTER — Other Ambulatory Visit (HOSPITAL_COMMUNITY): Payer: Self-pay

## 2022-11-08 ENCOUNTER — Telehealth: Payer: Self-pay | Admitting: Family Medicine

## 2022-11-08 NOTE — Telephone Encounter (Signed)
Contacted Curtis Tyler to schedule their annual wellness visit. Patient declined to schedule AWV at this time. Would like to speak with provider first   Curtis Tyler; [place] Direct Dial: [phone]

## 2022-11-12 ENCOUNTER — Other Ambulatory Visit (HOSPITAL_COMMUNITY): Payer: Self-pay

## 2022-11-17 DIAGNOSIS — S8264XA Nondisplaced fracture of lateral malleolus of right fibula, initial encounter for closed fracture: Secondary | ICD-10-CM | POA: Diagnosis not present

## 2022-11-21 ENCOUNTER — Telehealth: Payer: Self-pay | Admitting: Family Medicine

## 2022-11-21 ENCOUNTER — Other Ambulatory Visit: Payer: Self-pay

## 2022-11-21 DIAGNOSIS — R7303 Prediabetes: Secondary | ICD-10-CM

## 2022-11-21 DIAGNOSIS — E785 Hyperlipidemia, unspecified: Secondary | ICD-10-CM

## 2022-11-21 NOTE — Telephone Encounter (Signed)
Pt called asking if cholesterol and sugar labs could be put in for him to come in and have the labs done prior to his appt.

## 2022-11-21 NOTE — Telephone Encounter (Signed)
I have ordered labs, will call the pt to schedule the lab appointment.

## 2022-11-22 NOTE — Telephone Encounter (Signed)
Pt scheduled  

## 2022-11-23 ENCOUNTER — Other Ambulatory Visit (INDEPENDENT_AMBULATORY_CARE_PROVIDER_SITE_OTHER): Payer: PPO

## 2022-11-23 ENCOUNTER — Encounter: Payer: Self-pay | Admitting: Family Medicine

## 2022-11-23 DIAGNOSIS — R7303 Prediabetes: Secondary | ICD-10-CM

## 2022-11-23 DIAGNOSIS — E785 Hyperlipidemia, unspecified: Secondary | ICD-10-CM

## 2022-11-23 LAB — LIPID PANEL
Cholesterol: 176 mg/dL (ref 0–200)
HDL: 53.4 mg/dL (ref >39.00)
NonHDL: 122.11
Total CHOL/HDL Ratio: 3
Triglycerides: 203 mg/dL — ABNORMAL HIGH (ref 0.0–149.0)
VLDL: 40.6 mg/dL — ABNORMAL HIGH (ref 0.0–40.0)

## 2022-11-23 LAB — COMPREHENSIVE METABOLIC PANEL
ALT: 20 U/L (ref 0–53)
AST: 20 U/L (ref 0–37)
Albumin: 4.6 g/dL (ref 3.5–5.2)
Alkaline Phosphatase: 69 U/L (ref 39–117)
BUN: 35 mg/dL — ABNORMAL HIGH (ref 6–23)
CO2: 29 mEq/L (ref 19–32)
Calcium: 9.2 mg/dL (ref 8.4–10.5)
Chloride: 97 mEq/L (ref 96–112)
Creatinine, Ser: 1.26 mg/dL (ref 0.40–1.50)
GFR: 52.51 mL/min — ABNORMAL LOW (ref >60.00)
Glucose, Bld: 136 mg/dL — ABNORMAL HIGH (ref 70–99)
Potassium: 3.8 mEq/L (ref 3.5–5.1)
Sodium: 135 mEq/L (ref 135–145)
Total Bilirubin: 0.9 mg/dL (ref 0.2–1.2)
Total Protein: 7.4 g/dL (ref 6.0–8.3)

## 2022-11-23 LAB — LDL CHOLESTEROL, DIRECT: Direct LDL: 91 mg/dL

## 2022-11-23 LAB — HEMOGLOBIN A1C: Hgb A1c MFr Bld: 6.6 % — ABNORMAL HIGH (ref 4.6–6.5)

## 2022-11-25 NOTE — Progress Notes (Signed)
[facility] [address] [place], [place] [address] [phone]  Date:  11/28/2022   Name:  Curtis Tyler   DOB:  April 09, 1941   MRN:  [id number]  PCP:  [name], MD    Chief Complaint: DM, HLD f/u (Concerns/ questions: nausea and discomfort that has been and issue before but has come back. /AWV due)   History of Present Illness:  Curtis Tyler is a 82 y.o. very pleasant male patient who presents with the following:  Patient seen today with concern of nausea- history of prediabetes, coronary artery disease status post CABG, hyperlipidemia, hypertension, carotid stenosis, BPH  Most recent visit with myself was in December He has had 2 A1c over 6.5- change dx to diabetes   He broke his ankle last fall and was treated by [facility]-he is now out of his boot and getting back to his normal walking routine  We did blood work for him last week in preparation for this visit-see results below, we went over today  He has occasional Gi cramping and may use bentyl as needed - he had noted for a couple of weeks but now seems to be easing off  His GI, Dr Orvan Falconer is leaving the practice so he will need to establish with a new gastroenterologist Results for orders placed or performed in visit on 11/23/22  Lipid panel  Result Value Ref Range   Cholesterol 176 0 - 200 mg/dL   Triglycerides 440.1 (H) 0.0 - 149.0 mg/dL   HDL 02.72 >53.66 mg/dL   VLDL 44.0 (H) 0.0 - 34.7 mg/dL   Total CHOL/HDL Ratio 3    NonHDL 122.11   Hemoglobin A1c  Result Value Ref Range   Hgb A1c MFr Bld 6.6 (H) 4.6 - 6.5 %  Comprehensive metabolic panel  Result Value Ref Range   Sodium 135 135 - 145 mEq/L   Potassium 3.8 3.5 - 5.1 mEq/L   Chloride 97 96 - 112 mEq/L   CO2 29 19 - 32 mEq/L   Glucose, Bld 136 (H) 70 - 99 mg/dL   BUN 35 (H) 6 - 23 mg/dL   Creatinine, Ser 4.25 0.40 - 1.50 mg/dL   Total Bilirubin 0.9 0.2 - 1.2 mg/dL   Alkaline  Phosphatase 69 39 - 117 U/L   AST 20 0 - 37 U/L   ALT 20 0 - 53 U/L   Total Protein 7.4 6.0 - 8.3 g/dL   Albumin 4.6 3.5 - 5.2 g/dL   GFR 95.63 (L) >87.56 mL/min   Calcium 9.2 8.4 - 10.5 mg/dL  LDL cholesterol, direct  Result Value Ref Range   Direct LDL 91.0 mg/dL   Amlodipine 5 HCTZ 25 Losartan 100 Meloxicam Aspirin 81 Pravastatin Trazodone at bedtime Zetia 10 Omega-3 Gabapentin  BP Readings from Last 3 Encounters:  11/28/22 122/78  08/17/22 (!) 142/78  06/01/22 (!) 122/52     Patient Active Problem List   Diagnosis Date Noted   LUQ pain 09/15/2021   Educated about COVID-19 virus infection 05/11/2020   Lung nodule 04/10/2020   Elevated bilirubin 05/21/2019   Diabetes mellitus, type 2 05/20/2019   Dyslipidemia 05/03/2019   Bradycardia 09/12/2018   Plantar porokeratosis, acquired 05/16/2018   Abnormal stress ECG with treadmill 02/12/2016   Colonic polyp 09/21/2015   Left lower quadrant pain 09/03/2015   Cellulitis of right lower extremity 05/14/2015   Need for immunization against influenza 05/14/2015   Bilateral low back  pain with sciatica 09/03/2014   Hip pain, bilateral 09/03/2014   Bilateral carotid artery stenosis 07/21/2014   Myalgia 07/21/2014   Acute pain of left knee 07/15/2014   Benign prostatic hyperplasia with lower urinary tract symptoms 07/15/2014   Effusion of left knee 07/15/2014   History of elevated prostate specific antigen (PSA) 07/15/2014   Hypertrophy of nasal turbinates 07/15/2014   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 The LAD had a proximal 60-70% stenosis. There was mid 40% stenosis. Diagonal is moderate size with 40% stenosis. The circumflex system included a large OM with 60% ostial stenosis the RCA was occluded before the PDA. The EF was 65%.  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 when happened    Obesity    Radiculopathy    Shortness of breath dyspnea    with exertion -     Past Surgical History:  Procedure Laterality Date   APPENDECTOMY  1958   CARDIAC CATHETERIZATION  05/2004   SHOWING 100% OCCLUDED DISTAL RIGHT CORONARY WITH  LEFT  TO RIGHT COLLATERALS, AS WELL AS ANTEGRADE FLOW, NORMAL LEFT MAIN, 50% NARROWING IN LEFT CIRCUMFLEX  WITH IRREGULARITIES IN THE LAD   CARDIAC CATHETERIZATION N/A 02/12/2016   Procedure: Left Heart Cath and Coronary Angiography;  Surgeon: Marykay Lex, MD;  Location: [facility] INVASIVE CV LAB;  Service: Cardiovascular;  Laterality: N/A;   CARDIOVASCULAR STRESS TEST  02/26/2010   EF 65%, SMALL AREA OF MILD INFARCT AND POSSIBLE PER-INFARCT ISCHEMIA IN THE INFEROBASAL WALL   COLONOSCOPY     CORONARY ANGIOPLASTY     RIGHT CORONARY WERE UNSUCCESSFUL   CORONARY ARTERY BYPASS GRAFT N/A 02/25/2016   Procedure: CORONARY ARTERY BYPASS GRAFTING (CABG) times four using the left internal mammary artery and the right saphenus vein ;  Surgeon: Loreli Slot, MD;  Location: [facility] OR;  Service: Open Heart Surgery;  Laterality: N/A;   HERNIA REPAIR  1960   right hernia repair   INTRAOPERATIVE TRANSESOPHAGEAL ECHOCARDIOGRAM N/A 02/25/2016   Procedure: INTRAOPERATIVE TRANSESOPHAGEAL ECHOCARDIOGRAM;  Surgeon: Loreli Slot, MD;  Location[facility];  Service: Open Heart Surgery;  Laterality:  N/A;   UMBILICAL HERNIA REPAIR     US ECHOCARDIOGRAPHY  01/03/2007   EF 55-60%    Social History   Tobacco Use   Smoking status: Former    Packs/day: 1.00    Years: 25.00    Additional pack years: 0.00    Total pack years: 25.00    Types: Cigarettes    Quit date: 07/23/1981    Years since quitting: 41.3   Smokeless tobacco: Never  Vaping Use   Vaping Use: Never used  Substance Use Topics   Alcohol use: Yes    Alcohol/week: 7.0 standard drinks  of alcohol    Types: 7 Glasses of wine per week    Comment: glass of wine a night    Drug use: No    Family History  Problem Relation Age of Onset   Congestive Heart Failure Mother    Diabetes Mother    Stroke Father    Hypertension Neg Hx    Colon cancer Neg Hx    Colon polyps Neg Hx    Esophageal cancer Neg Hx    Rectal cancer Neg Hx    Stomach cancer Neg Hx    Neuropathy Neg Hx     No Known Allergies  Medication list has been reviewed and updated.  Current Outpatient Medications on File Prior to Visit  Medication Sig Dispense Refill   acetaminophen (TYLENOL) 325 MG tablet Take 2 tablets (650 mg total) by mouth every 6 (six) hours as needed for pain. 30 tablet 0   amLODipine (NORVASC) 5 MG tablet Take 1 tablet (5 mg total) by mouth daily. 90 tablet 1   aspirin 81 MG tablet Take 81 mg by mouth daily.     Coenzyme Q10 100 MG TABS Take 100 mg by mouth daily.      dicyclomine (BENTYL) 10 MG capsule Take 1 capsule (10 mg total) by mouth every morning. May take an additional 3 times daily as needed. 50 capsule 3   ezetimibe (ZETIA) 10 MG tablet Take 1 tablet (10 mg total) by mouth daily. 90 tablet 1   fish oil-omega-3 fatty acids 1000 MG capsule Take 1,400 mg by mouth daily.     gabapentin (NEURONTIN) 300 MG capsule Take 1 capsule (300 mg total) by mouth in the morning AND 1 capsule (300 mg total) daily at 12 noon as needed AND 2 capsules (600 mg total) at bedtime. 360 capsule 1   Glucosamine-Chondroitin-MSM 500-200-150 MG TABS  Take by mouth.     hydrochlorothiazide (HYDRODIURIL) 25 MG tablet Take 1 tablet (25 mg total) by mouth daily. 90 tablet 3   latanoprost (XALATAN) 0.005 % ophthalmic solution Place 1 drop into both eyes nightly. 7.5 mL 3   losartan (COZAAR) 100 MG tablet Take 1 tablet (100 mg total) by mouth daily. 90 tablet 1   meloxicam (MOBIC) 15 MG tablet Take 1 tablet (15 mg total) by mouth daily. 30 tablet 3   Plant Sterols and Stanols 450 MG TABS Take 900 mg by mouth daily.     pravastatin (PRAVACHOL) 80 MG tablet Take 1 tablet (80 mg total) by mouth daily. 90 tablet 3   traZODone (DESYREL) 50 MG tablet Take 0.5-1 tablets (25-50 mg total) by mouth at bedtime as needed for sleep. 30 tablet 3   No current facility-administered medications on file prior to visit.    Review of Systems:  As per HPI- otherwise negative.   Physical Examination: Vitals:   11/28/22 1531  BP: 122/78  Pulse: 70  Resp: 18  Temp: 97.7 F (36.5 C)   Vitals:   11/28/22 1531  Weight: 181 lb (82.1 kg)  Height: 5\' 2"  (1.575 m)   Body mass index is 33.11 kg/m. Ideal Body Weight: Weight in (lb) to have BMI = 25: 136.4  GEN: no acute distress. Central obesity, looks well  HEENT: Atraumatic, Normocephalic.  Ears and Nose: No external deformity. CV: RRR, No M/G/R. No JVD. No thrill. No extra heart sounds. PULM: CTA B, no wheezes, crackles, rhonchi. No retractions. No resp. distress. No accessory muscle use. ABD: S, NT, ND, +BS. No rebound. No  HSM.  Belly is benign on exam today  EXTR: No c/c/e PSYCH: Normally interactive. Conversant.    Assessment and Plan: Renal insufficiency - Plan: Basic metabolic panel  Hyperlipidemia with target LDL less than 70  Essential hypertension  Coronary artery disease involving native coronary artery of native heart without angina pectoris  Type 2 diabetes mellitus with chronic kidney disease, without long-term current use of insulin, unspecified CKD stage  Patient seen today for  follow-up.  We preordered labs as above which we went over today.  Blood pressure under good control on current regimen Advised that he now has diabetes as opposed to prediabetes, however clinically this is not a huge change for him.  Will continue to monitor A1c at least every 6 months His GFR dips below 60 which is a new finding for him.  May be a temporary effect.  We discussed potentially going down his diuretic but he does not wish to change his blood pressure regimen.  Will plan to recheck a BMP as a lab visit only in 2 to 3 months, otherwise follow-up in 6 months  Signed Abbe Amsterdam, MD

## 2022-11-26 ENCOUNTER — Other Ambulatory Visit (HOSPITAL_COMMUNITY): Payer: Self-pay

## 2022-11-28 ENCOUNTER — Ambulatory Visit (INDEPENDENT_AMBULATORY_CARE_PROVIDER_SITE_OTHER): Payer: PPO | Admitting: Family Medicine

## 2022-11-28 VITALS — BP 122/78 | HR 70 | Temp 97.7°F | Resp 18 | Ht 62.0 in | Wt 181.0 lb

## 2022-11-28 DIAGNOSIS — N289 Disorder of kidney and ureter, unspecified: Secondary | ICD-10-CM

## 2022-11-28 DIAGNOSIS — I10 Essential (primary) hypertension: Secondary | ICD-10-CM

## 2022-11-28 DIAGNOSIS — I2510 Atherosclerotic heart disease of native coronary artery without angina pectoris: Secondary | ICD-10-CM

## 2022-11-28 DIAGNOSIS — E785 Hyperlipidemia, unspecified: Secondary | ICD-10-CM

## 2022-11-28 DIAGNOSIS — E1122 Type 2 diabetes mellitus with diabetic chronic kidney disease: Secondary | ICD-10-CM | POA: Diagnosis not present

## 2022-11-28 NOTE — Patient Instructions (Addendum)
It was good to see you today- as we discussed, your A1c has been over 6.5% twice so we will change your diagnosis from pre-diabetes to diabetes.  This does not make a huge difference in your reality however!  Continue to check A1c about twice a year  Your kidney filtration rate dipped a bit- not shocking at age 82.  Let's plan to recheck your kidney function in 2-3 months; I will order for you if you wish to do as a lab visit only

## 2022-11-29 ENCOUNTER — Other Ambulatory Visit (HOSPITAL_COMMUNITY): Payer: Self-pay

## 2022-11-29 DIAGNOSIS — H5201 Hypermetropia, right eye: Secondary | ICD-10-CM | POA: Diagnosis not present

## 2022-11-29 DIAGNOSIS — H52203 Unspecified astigmatism, bilateral: Secondary | ICD-10-CM | POA: Diagnosis not present

## 2022-11-29 DIAGNOSIS — H401131 Primary open-angle glaucoma, bilateral, mild stage: Secondary | ICD-10-CM | POA: Diagnosis not present

## 2022-11-29 DIAGNOSIS — H524 Presbyopia: Secondary | ICD-10-CM | POA: Diagnosis not present

## 2022-11-29 DIAGNOSIS — H5212 Myopia, left eye: Secondary | ICD-10-CM | POA: Diagnosis not present

## 2022-11-29 DIAGNOSIS — Z961 Presence of intraocular lens: Secondary | ICD-10-CM | POA: Diagnosis not present

## 2022-11-29 DIAGNOSIS — H0288A Meibomian gland dysfunction right eye, upper and lower eyelids: Secondary | ICD-10-CM | POA: Diagnosis not present

## 2022-11-29 DIAGNOSIS — H0288B Meibomian gland dysfunction left eye, upper and lower eyelids: Secondary | ICD-10-CM | POA: Diagnosis not present

## 2022-11-29 MED ORDER — LATANOPROST 0.005 % OP SOLN
1.0000 [drp] | Freq: Every evening | OPHTHALMIC | 3 refills | Status: DC
  Filled 2022-11-29: qty 7.5, 75d supply, fill #0

## 2022-12-05 ENCOUNTER — Encounter: Payer: Self-pay | Admitting: Family Medicine

## 2022-12-05 DIAGNOSIS — R911 Solitary pulmonary nodule: Secondary | ICD-10-CM

## 2022-12-05 NOTE — Telephone Encounter (Signed)
Are you understanding what the pt needs?

## 2022-12-09 ENCOUNTER — Ambulatory Visit (HOSPITAL_BASED_OUTPATIENT_CLINIC_OR_DEPARTMENT_OTHER)
Admission: RE | Admit: 2022-12-09 | Discharge: 2022-12-09 | Disposition: A | Discharge status: Home / Self Care | Payer: PPO | Source: Ambulatory Visit | Attending: Family Medicine | Admitting: Family Medicine

## 2022-12-09 DIAGNOSIS — R911 Solitary pulmonary nodule: Secondary | ICD-10-CM | POA: Diagnosis not present

## 2022-12-13 ENCOUNTER — Encounter: Payer: Self-pay | Admitting: Family Medicine

## 2022-12-13 ENCOUNTER — Other Ambulatory Visit: Payer: Self-pay | Admitting: Family Medicine

## 2022-12-13 DIAGNOSIS — R911 Solitary pulmonary nodule: Secondary | ICD-10-CM

## 2022-12-14 NOTE — Telephone Encounter (Signed)
Would you like to offer the 11:40?

## 2022-12-15 ENCOUNTER — Other Ambulatory Visit (HOSPITAL_COMMUNITY): Payer: Self-pay

## 2022-12-18 NOTE — Progress Notes (Unsigned)
[facility] [address] [place], [place] [address] [phone]  Date:  12/21/2022   Name:  Curtis Tyler   DOB:  June 22, 1941   MRN:  [id number]  PCP:  [name], MD    Chief Complaint: No chief complaint on file.   History of Present Illness:  Curtis Tyler is a 82 y.o. very pleasant male patient who presents with the following:  History ofdiabetes, coronary artery disease status post CABG, hyperlipidemia, hypertension, carotid stenosis, BPH  Patient seen today for virtual visit, he wishes to discuss recent CT scan report-I sent him the following MyChart message on 4/16  CT CHEST WITHOUT CONTRAST  COMPARISON:  CT chest 04/08/2020 and CT abdomen and pelvis 10/02/2019.   FINDINGS: Cardiovascular: There is cardiomegaly. No pericardial effusion. Extensive calcific aortic and coronary atherosclerosis is noted. The patient is status post CABG.   Mediastinum/Nodes: No enlarged mediastinal or axillary lymph nodes. Thyroid gland, trachea, and esophagus demonstrate no significant findings. Calcified bilateral axillary lymph nodes are unchanged and consistent with some remote infectious or inflammatory process.   Lungs/Pleura: No pleural effusion. A 0.8 x 0.6 cm right lower lobe pulmonary nodule seen on image 84, series 4. On the prior chest CT, the nodule measured 0.9 x 0.3 cm. The lungs are otherwise clear.   Upper Abdomen: Imaged upper abdomen demonstrates an unchanged cyst in the upper pole of the right kidney.   Musculoskeletal: No acute or focal bony abnormality is identified.   IMPRESSION: Slight increase in the size of a right lower lobe pulmonary nodule over the past 2 years. Recommend repeat noncontrast chest CT in 6 months to ensure stability.   Calcific coronary artery disease.  The patient is status post CABG.   Aortic Atherosclerosis  (ICD10-I70.0).--------------------------------------------------   Radiology notes that a small nodule in the right lower lung has gotten a little bit bigger over the last 2 years.  They do want [place] to get a repeat CT in 6 months to make sure it is not growing larger   I called radiology and asked them about the small hiatal hernia noted in 2021.  The radiologist I spoke with said the hiatal hernia noted at that time was very small.  He did not appreciate any evidence of hiatal hernia on your CT this month.  If you are having stomach concerns, a CT scan is probably not the best test for you.  We may want to have you see gastroenterology.  Can you give me a few more details about what you have noticed?   I will order a repeat CT of your chest to be done in October-sometimes scheduling will call you and try to have this done right away.  If this happens, please remind them this is a follow-up to be done in 6 months!     Curtis Tyler was really just concerned about this pulmonary nodule, we discussed that radiology has a standardized protocol for follow-up of nodules depending on size, change over time, etc.  Explained that at this time a biopsy is not recommended but we will want to follow-up in 6 months. Curtis Tyler states understanding, he is comfortable with this plan.  He has no other concerns at this time, he has made a note in his calendar regarding when his next CT scan is due Patient Active Problem List   Diagnosis Date Noted   LUQ pain 09/15/2021   Educated about COVID-19 virus infection 05/11/2020  Lung nodule 04/10/2020   Elevated bilirubin 05/21/2019   Diabetes mellitus, type 2 05/20/2019   Dyslipidemia 05/03/2019   Bradycardia 09/12/2018   Plantar porokeratosis, acquired 05/16/2018   Abnormal stress ECG with treadmill 02/12/2016   Colonic polyp 09/21/2015   Left lower quadrant pain 09/03/2015   Cellulitis of right lower extremity 05/14/2015   Need for immunization against influenza 05/14/2015    Bilateral low back pain with sciatica 09/03/2014   Hip pain, bilateral 09/03/2014   Bilateral carotid artery stenosis 07/21/2014   Myalgia 07/21/2014   Acute pain of left knee 07/15/2014   Benign prostatic hyperplasia with lower urinary tract symptoms 07/15/2014   Effusion of left knee 07/15/2014   History of elevated prostate specific antigen (PSA) 07/15/2014   Hypertrophy of nasal turbinates 07/15/2014   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 The LAD had a proximal 60-70% stenosis. There was mid 40% stenosis. Diagonal is moderate size with 40% stenosis. The circumflex system included a large OM with 60% ostial stenosis the RCA was occluded before the PDA. The EF was 65%.  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 when happened    Obesity    Radiculopathy    Shortness of breath dyspnea    with exertion -     Past Surgical History:  Procedure Laterality Date   APPENDECTOMY  1958   CARDIAC CATHETERIZATION  05/2004   SHOWING 100% OCCLUDED DISTAL RIGHT CORONARY WITH  LEFT  TO RIGHT COLLATERALS, AS WELL AS ANTEGRADE FLOW, NORMAL LEFT MAIN, 50% NARROWING IN LEFT CIRCUMFLEX  WITH IRREGULARITIES IN THE LAD   CARDIAC CATHETERIZATION N/A 02/12/2016   Procedure: Left Heart Cath and Coronary  Angiography;  Surgeon: Marykay Lex, MD;  Location: [facility] INVASIVE CV LAB;  Service: Cardiovascular;  Laterality: N/A;   CARDIOVASCULAR STRESS TEST  02/26/2010   EF 65%, SMALL AREA OF MILD INFARCT AND POSSIBLE PER-INFARCT ISCHEMIA IN THE INFEROBASAL WALL   COLONOSCOPY     CORONARY ANGIOPLASTY     RIGHT CORONARY WERE UNSUCCESSFUL   CORONARY ARTERY BYPASS GRAFT N/A 02/25/2016   Procedure: CORONARY ARTERY BYPASS GRAFTING (CABG) times four using the left internal mammary artery and the right saphenus vein ;  Surgeon: Loreli Slot, MD;  Location: [facility] OR;  Service: Open Heart Surgery;  Laterality: N/A;   HERNIA REPAIR  1960   right hernia repair   INTRAOPERATIVE TRANSESOPHAGEAL ECHOCARDIOGRAM N/A 02/25/2016   Procedure: INTRAOPERATIVE TRANSESOPHAGEAL ECHOCARDIOGRAM;  Surgeon: Loreli Slot, MD;  Location: [facility] OR;  Service: Open Heart Surgery;  Laterality: N/A;   UMBILICAL HERNIA REPAIR     US ECHOCARDIOGRAPHY  01/03/2007   EF 55-60%    Social History   Tobacco Use   Smoking status: Former  Packs/day: 1.00    Years: 25.00    Additional pack years: 0.00    Total pack years: 25.00    Types: Cigarettes    Quit date: 07/23/1981    Years since quitting: 41.4   Smokeless tobacco: Never  Vaping Use   Vaping Use: Never used  Substance Use Topics   Alcohol use: Yes    Alcohol/week: 7.0 standard drinks of alcohol    Types: 7 Glasses of wine per week    Comment: glass of wine a night    Drug use: No    Family History  Problem Relation Age of Onset   Congestive Heart Failure Mother    Diabetes Mother    Stroke Father    Hypertension Neg Hx    Colon cancer Neg Hx    Colon polyps Neg Hx    Esophageal cancer Neg Hx    Rectal cancer Neg Hx    Stomach cancer Neg Hx    Neuropathy Neg Hx     No Known Allergies  Medication list has been reviewed and updated.  Current Outpatient Medications on File Prior to Visit  Medication Sig Dispense Refill   acetaminophen (TYLENOL) 325 MG  tablet Take 2 tablets (650 mg total) by mouth every 6 (six) hours as needed for pain. 30 tablet 0   amLODipine (NORVASC) 5 MG tablet Take 1 tablet (5 mg total) by mouth daily. 90 tablet 1   aspirin 81 MG tablet Take 81 mg by mouth daily.     Coenzyme Q10 100 MG TABS Take 100 mg by mouth daily.      dicyclomine (BENTYL) 10 MG capsule Take 1 capsule (10 mg total) by mouth every morning. May take an additional 3 times daily as needed. 50 capsule 3   ezetimibe (ZETIA) 10 MG tablet Take 1 tablet (10 mg total) by mouth daily. 90 tablet 1   fish oil-omega-3 fatty acids 1000 MG capsule Take 1,400 mg by mouth daily.     gabapentin (NEURONTIN) 300 MG capsule Take 1 capsule (300 mg total) by mouth in the morning AND 1 capsule (300 mg total) daily at 12 noon as needed AND 2 capsules (600 mg total) at bedtime. 360 capsule 1   Glucosamine-Chondroitin-MSM 500-200-150 MG TABS Take by mouth.     hydrochlorothiazide (HYDRODIURIL) 25 MG tablet Take 1 tablet (25 mg total) by mouth daily. 90 tablet 3   latanoprost (XALATAN) 0.005 % ophthalmic solution Place 1 drop into both eyes nightly. 7.5 mL 3   latanoprost (XALATAN) 0.005 % ophthalmic solution Place 1 drop into both eyes Nightly. 7.5 mL 3   losartan (COZAAR) 100 MG tablet Take 1 tablet (100 mg total) by mouth daily. 90 tablet 1   meloxicam (MOBIC) 15 MG tablet Take 1 tablet (15 mg total) by mouth daily. 30 tablet 3   Plant Sterols and Stanols 450 MG TABS Take 900 mg by mouth daily.     pravastatin (PRAVACHOL) 80 MG tablet Take 1 tablet (80 mg total) by mouth daily. 90 tablet 3   traZODone (DESYREL) 50 MG tablet Take 0.5-1 tablets (25-50 mg total) by mouth at bedtime as needed for sleep. 30 tablet 3   No current facility-administered medications on file prior to visit.    Review of Systems:  As per HPI- otherwise negative.   Physical Examination: There were no vitals filed for this visit. There were no vitals filed for this visit. There is no height or  weight on file to calculate BMI.  Ideal Body Weight:    Spoke with patient over MyChart video No distress or shortness of breath is noted  Assessment and Plan: Lung nodule  Chronic GERD - Plan: Ambulatory referral to Gastroenterology'  Discussed pulmonary nodule as above, patient is reassured and feels comfortable with our follow-up plan He also would like a referral to gastroenterology as his previous doctor left the practice.  I have placed this referral  Signed Abbe Amsterdam, MD

## 2022-12-21 ENCOUNTER — Telehealth (INDEPENDENT_AMBULATORY_CARE_PROVIDER_SITE_OTHER): Payer: PPO | Admitting: Family Medicine

## 2022-12-21 DIAGNOSIS — R911 Solitary pulmonary nodule: Secondary | ICD-10-CM | POA: Diagnosis not present

## 2022-12-21 DIAGNOSIS — K219 Gastro-esophageal reflux disease without esophagitis: Secondary | ICD-10-CM | POA: Diagnosis not present

## 2022-12-23 ENCOUNTER — Telehealth: Payer: Self-pay | Admitting: Family Medicine

## 2022-12-23 ENCOUNTER — Other Ambulatory Visit: Payer: Self-pay

## 2022-12-23 ENCOUNTER — Other Ambulatory Visit (HOSPITAL_COMMUNITY): Payer: Self-pay

## 2022-12-23 NOTE — Telephone Encounter (Signed)
Contacted [name] to schedule their annual wellness visit. Patient declined to schedule AWV at this time. Patient stated is not necessary he DECLINED AWV DO NOT CALL.   Verlee Rossetti; Care Guide Ambulatory Clinical Support [facility] Direct Dial: [phone]

## 2022-12-29 ENCOUNTER — Encounter: Payer: Self-pay | Admitting: Family Medicine

## 2022-12-29 DIAGNOSIS — K219 Gastro-esophageal reflux disease without esophagitis: Secondary | ICD-10-CM

## 2023-01-19 ENCOUNTER — Encounter: Payer: Self-pay | Admitting: Neurology

## 2023-01-19 ENCOUNTER — Other Ambulatory Visit: Payer: Self-pay

## 2023-01-19 ENCOUNTER — Other Ambulatory Visit: Payer: Self-pay | Admitting: Family Medicine

## 2023-01-19 ENCOUNTER — Other Ambulatory Visit (HOSPITAL_COMMUNITY): Payer: Self-pay

## 2023-01-20 ENCOUNTER — Other Ambulatory Visit (HOSPITAL_COMMUNITY): Payer: Self-pay

## 2023-01-20 ENCOUNTER — Other Ambulatory Visit: Payer: Self-pay

## 2023-01-20 MED ORDER — AMLODIPINE BESYLATE 5 MG PO TABS
5.0000 mg | ORAL_TABLET | Freq: Every day | ORAL | 1 refills | Status: DC
  Filled 2023-01-20: qty 90, 90d supply, fill #0
  Filled 2023-02-01 – 2023-04-16 (×3): qty 90, 90d supply, fill #1

## 2023-01-27 ENCOUNTER — Other Ambulatory Visit (HOSPITAL_COMMUNITY): Payer: Self-pay

## 2023-01-30 ENCOUNTER — Other Ambulatory Visit (HOSPITAL_BASED_OUTPATIENT_CLINIC_OR_DEPARTMENT_OTHER): Payer: Self-pay

## 2023-02-01 ENCOUNTER — Other Ambulatory Visit (HOSPITAL_COMMUNITY): Payer: Self-pay

## 2023-02-02 ENCOUNTER — Encounter: Payer: Self-pay | Admitting: Family Medicine

## 2023-02-02 ENCOUNTER — Other Ambulatory Visit: Payer: Self-pay

## 2023-02-02 DIAGNOSIS — R413 Other amnesia: Secondary | ICD-10-CM

## 2023-02-03 ENCOUNTER — Other Ambulatory Visit: Payer: Self-pay | Admitting: Family Medicine

## 2023-02-03 ENCOUNTER — Other Ambulatory Visit (HOSPITAL_COMMUNITY): Payer: Self-pay

## 2023-02-03 MED ORDER — EZETIMIBE 10 MG PO TABS
10.0000 mg | ORAL_TABLET | Freq: Every day | ORAL | 3 refills | Status: DC
  Filled 2023-02-03: qty 90, 90d supply, fill #0
  Filled 2023-04-30: qty 90, 90d supply, fill #1
  Filled 2023-07-31: qty 90, 90d supply, fill #2
  Filled 2023-10-23: qty 90, 90d supply, fill #3

## 2023-02-04 ENCOUNTER — Other Ambulatory Visit (HOSPITAL_COMMUNITY): Payer: Self-pay

## 2023-02-06 ENCOUNTER — Other Ambulatory Visit: Payer: Self-pay

## 2023-02-07 DIAGNOSIS — E119 Type 2 diabetes mellitus without complications: Secondary | ICD-10-CM | POA: Diagnosis not present

## 2023-02-07 DIAGNOSIS — L84 Corns and callosities: Secondary | ICD-10-CM | POA: Diagnosis not present

## 2023-02-07 DIAGNOSIS — L600 Ingrowing nail: Secondary | ICD-10-CM | POA: Diagnosis not present

## 2023-02-08 ENCOUNTER — Other Ambulatory Visit: Payer: Self-pay | Admitting: Family Medicine

## 2023-02-08 ENCOUNTER — Other Ambulatory Visit: Payer: Self-pay

## 2023-02-08 ENCOUNTER — Other Ambulatory Visit (HOSPITAL_COMMUNITY): Payer: Self-pay

## 2023-02-08 ENCOUNTER — Other Ambulatory Visit (INDEPENDENT_AMBULATORY_CARE_PROVIDER_SITE_OTHER): Payer: PPO

## 2023-02-08 DIAGNOSIS — R413 Other amnesia: Secondary | ICD-10-CM | POA: Diagnosis not present

## 2023-02-08 DIAGNOSIS — I10 Essential (primary) hypertension: Secondary | ICD-10-CM

## 2023-02-08 LAB — VITAMIN B12: Vitamin B-12: 345 pg/mL (ref 211–911)

## 2023-02-08 LAB — TSH: TSH: 2.82 u[IU]/mL (ref 0.35–5.50)

## 2023-02-08 MED ORDER — LOSARTAN POTASSIUM 100 MG PO TABS
100.0000 mg | ORAL_TABLET | Freq: Every day | ORAL | 1 refills | Status: DC
Start: 2023-02-08 — End: 2023-08-21
  Filled 2023-02-08: qty 90, 90d supply, fill #0
  Filled 2023-05-29: qty 90, 90d supply, fill #1

## 2023-02-09 ENCOUNTER — Encounter: Payer: Self-pay | Admitting: Family Medicine

## 2023-02-09 DIAGNOSIS — R413 Other amnesia: Secondary | ICD-10-CM

## 2023-02-09 LAB — RPR: RPR Ser Ql: NONREACTIVE

## 2023-02-16 ENCOUNTER — Other Ambulatory Visit (HOSPITAL_BASED_OUTPATIENT_CLINIC_OR_DEPARTMENT_OTHER): Payer: Self-pay

## 2023-02-16 DIAGNOSIS — L03032 Cellulitis of left toe: Secondary | ICD-10-CM | POA: Diagnosis not present

## 2023-02-16 MED ORDER — CEPHALEXIN 500 MG PO CAPS
500.0000 mg | ORAL_CAPSULE | Freq: Two times a day (BID) | ORAL | 0 refills | Status: AC
  Filled 2023-02-16: qty 14, 7d supply, fill #0

## 2023-02-21 ENCOUNTER — Other Ambulatory Visit: Payer: Self-pay

## 2023-02-22 ENCOUNTER — Other Ambulatory Visit (HOSPITAL_COMMUNITY): Payer: Self-pay

## 2023-02-23 ENCOUNTER — Telehealth: Payer: Self-pay | Admitting: Family Medicine

## 2023-02-23 DIAGNOSIS — Z9889 Other specified postprocedural states: Secondary | ICD-10-CM | POA: Diagnosis not present

## 2023-02-23 DIAGNOSIS — L03032 Cellulitis of left toe: Secondary | ICD-10-CM | POA: Diagnosis not present

## 2023-02-23 DIAGNOSIS — M19072 Primary osteoarthritis, left ankle and foot: Secondary | ICD-10-CM | POA: Diagnosis not present

## 2023-02-23 NOTE — Telephone Encounter (Signed)
Pt dropped off document to be filled out by provider ([employer] Form - 2 pages) Pt would like document faxed to [phone] when ready. Document put at front office tray under providers name.

## 2023-02-24 NOTE — Telephone Encounter (Signed)
Form placed in provider's folder to review 

## 2023-03-14 ENCOUNTER — Ambulatory Visit: Payer: PPO | Admitting: Gastroenterology

## 2023-03-15 ENCOUNTER — Ambulatory Visit: Payer: PPO | Admitting: *Deleted

## 2023-03-15 DIAGNOSIS — Z0000 Encounter for general adult medical examination without abnormal findings: Secondary | ICD-10-CM | POA: Diagnosis not present

## 2023-03-15 NOTE — Patient Instructions (Signed)
[name] , Thank you for taking time to come for your Medicare Wellness Visit. I appreciate your ongoing commitment to your health goals. Please review the following plan we discussed and let me know if I can assist you in the future.     This is a list of the screening recommended for you and due dates:  Health Maintenance  Topic Date Due   Complete foot exam   Never done   Yearly kidney health urinalysis for diabetes  Never done   COVID-19 Vaccine (7 - 2023-24 season) 07/24/2022   Flu Shot  03/30/2023   Hemoglobin A1C  05/26/2023   Yearly kidney function blood test for diabetes  11/23/2023   Eye exam for diabetics  11/29/2023   Medicare Annual Wellness Visit  03/14/2024   DTaP/Tdap/Td vaccine (3 - Td or Tdap) 12/01/2030   Zoster (Shingles) Vaccine  Completed   HPV Vaccine  Aged Out   Pneumonia Vaccine  Discontinued    Next appointment: Follow up in one year for your annual wellness visit.   Preventive Care 84 Years and Older, Male Preventive care refers to lifestyle choices and visits with your health care provider that can promote health and wellness. What does preventive care include? A yearly physical exam. This is also called an annual well check. Dental exams once or twice a year. Routine eye exams. Ask your health care provider how often you should have your eyes checked. Personal lifestyle choices, including: Daily care of your teeth and gums. Regular physical activity. Eating a healthy diet. Avoiding tobacco and drug use. Limiting alcohol use. Practicing safe sex. Taking low doses of aspirin every day. Taking vitamin and mineral supplements as recommended by your health care provider. What happens during an annual well check? The services and screenings done by your health care provider during your annual well check will depend on your age, overall health, lifestyle risk factors, and family history of disease. Counseling  Your health care provider may ask you  questions about your: Alcohol use. Tobacco use. Drug use. Emotional well-being. Home and relationship well-being. Sexual activity. Eating habits. History of falls. Memory and ability to understand (cognition). Work and work Astronomer. Screening  You may have the following tests or measurements: Height, weight, and BMI. Blood pressure. Lipid and cholesterol levels. These may be checked every 5 years, or more frequently if you are over 13 years old. Skin check. Lung cancer screening. You may have this screening every year starting at age 24 if you have a 30-pack-year history of smoking and currently smoke or have quit within the past 15 years. Fecal occult blood test (FOBT) of the stool. You may have this test every year starting at age 84. Flexible sigmoidoscopy or colonoscopy. You may have a sigmoidoscopy every 5 years or a colonoscopy every 10 years starting at age 43. Prostate cancer screening. Recommendations will vary depending on your family history and other risks. Hepatitis C blood test. Hepatitis B blood test. Sexually transmitted disease (STD) testing. Diabetes screening. This is done by checking your blood sugar (glucose) after you have not eaten for a while (fasting). You may have this done every 1-3 years. Abdominal aortic aneurysm (AAA) screening. You may need this if you are a current or former smoker. Osteoporosis. You may be screened starting at age 61 if you are at high risk. Talk with your health care provider about your test results, treatment options, and if necessary, the need for more tests. Vaccines  Your health care  provider may recommend certain vaccines, such as: Influenza vaccine. This is recommended every year. Tetanus, diphtheria, and acellular pertussis (Tdap, Td) vaccine. You may need a Td booster every 10 years. Zoster vaccine. You may need this after age 29. Pneumococcal 13-valent conjugate (PCV13) vaccine. One dose is recommended after age  77. Pneumococcal polysaccharide (PPSV23) vaccine. One dose is recommended after age 17. Talk to your health care provider about which screenings and vaccines you need and how often you need them. This information is not intended to replace advice given to you by your health care provider. Make sure you discuss any questions you have with your health care provider. Document Released: 09/11/2015 Document Revised: 05/04/2016 Document Reviewed: 06/16/2015 Elsevier Interactive Patient Education  2017 [employer].  Fall Prevention in the Home Falls can cause injuries. They can happen to people of all ages. There are many things you can do to make your home safe and to help prevent falls. What can I do on the outside of my home? Regularly fix the edges of walkways and driveways and fix any cracks. Remove anything that might make you trip as you walk through a door, such as a raised step or threshold. Trim any bushes or trees on the path to your home. Use bright outdoor lighting. Clear any walking paths of anything that might make someone trip, such as rocks or tools. Regularly check to see if handrails are loose or broken. Make sure that both sides of any steps have handrails. Any raised decks and porches should have guardrails on the edges. Have any leaves, snow, or ice cleared regularly. Use sand or salt on walking paths during winter. Clean up any spills in your garage right away. This includes oil or grease spills. What can I do in the bathroom? Use night lights. Install grab bars by the toilet and in the tub and shower. Do not use towel bars as grab bars. Use non-skid mats or decals in the tub or shower. If you need to sit down in the shower, use a plastic, non-slip stool. Keep the floor dry. Clean up any water that spills on the floor as soon as it happens. Remove soap buildup in the tub or shower regularly. Attach bath mats securely with double-sided non-slip rug tape. Do not have throw  rugs and other things on the floor that can make you trip. What can I do in the bedroom? Use night lights. Make sure that you have a light by your bed that is easy to reach. Do not use any sheets or blankets that are too big for your bed. They should not hang down onto the floor. Have a firm chair that has side arms. You can use this for support while you get dressed. Do not have throw rugs and other things on the floor that can make you trip. What can I do in the kitchen? Clean up any spills right away. Avoid walking on wet floors. Keep items that you use a lot in easy-to-reach places. If you need to reach something above you, use a strong step stool that has a grab bar. Keep electrical cords out of the way. Do not use floor polish or wax that makes floors slippery. If you must use wax, use non-skid floor wax. Do not have throw rugs and other things on the floor that can make you trip. What can I do with my stairs? Do not leave any items on the stairs. Make sure that there are handrails on both  sides of the stairs and use them. Fix handrails that are broken or loose. Make sure that handrails are as long as the stairways. Check any carpeting to make sure that it is firmly attached to the stairs. Fix any carpet that is loose or worn. Avoid having throw rugs at the top or bottom of the stairs. If you do have throw rugs, attach them to the floor with carpet tape. Make sure that you have a light switch at the top of the stairs and the bottom of the stairs. If you do not have them, ask someone to add them for you. What else can I do to help prevent falls? Wear shoes that: Do not have high heels. Have rubber bottoms. Are comfortable and fit you well. Are closed at the toe. Do not wear sandals. If you use a stepladder: Make sure that it is fully opened. Do not climb a closed stepladder. Make sure that both sides of the stepladder are locked into place. Ask someone to hold it for you, if  possible. Clearly mark and make sure that you can see: Any grab bars or handrails. First and last steps. Where the edge of each step is. Use tools that help you move around (mobility aids) if they are needed. These include: Canes. Walkers. Scooters. Crutches. Turn on the lights when you go into a dark area. Replace any light bulbs as soon as they burn out. Set up your furniture so you have a clear path. Avoid moving your furniture around. If any of your floors are uneven, fix them. If there are any pets around you, be aware of where they are. Review your medicines with your doctor. Some medicines can make you feel dizzy. This can increase your chance of falling. Ask your doctor what other things that you can do to help prevent falls. This information is not intended to replace advice given to you by your health care provider. Make sure you discuss any questions you have with your health care provider. Document Released: 06/11/2009 Document Revised: 01/21/2016 Document Reviewed: 09/19/2014 Elsevier Interactive Patient Education  2017 [employer].

## 2023-03-15 NOTE — Progress Notes (Signed)
Subjective:   Curtis Tyler is a 82 y.o. male who presents for Medicare Annual/Subsequent preventive examination.  Visit Complete: Virtual  I connected with  Curtis Tyler on 03/15/23 by a audio enabled telemedicine application and verified that I am speaking with the correct person using two identifiers.  Patient Location: Home  Provider Location: Office/Clinic  I discussed the limitations of evaluation and management by telemedicine. The patient expressed understanding and agreed to proceed.   Review of Systems     Cardiac Risk Factors include: advanced age (>59men, >67 women);male gender;diabetes mellitus;dyslipidemia;hypertension     Objective:    Per patient no change in vitals since last visit, unable to obtain new vitals due to telehealth visit      03/15/2023    9:49 AM 10/11/2022    7:57 AM 11/15/2019    9:49 AM 03/18/2019    3:13 PM 04/07/2016   10:12 AM 02/25/2016    6:03 AM 02/23/2016    1:36 PM  Advanced Directives  Does Patient Have a Medical Advance Directive? Yes No Yes No Yes Yes Yes  Type of Estate agent of [place];Living will  Healthcare Power of [place];Living will   Living will;Healthcare Power of [employer] Power of [place];Living will  Does patient want to make changes to medical advance directive? No - Patient declined  No - Patient declined   No - Patient declined   Copy of Healthcare Power of Attorney in Chart? No - copy requested  Yes - validated most recent copy scanned in chart (See row information)   Yes No - copy requested  Would patient like information on creating a medical advance directive?  No - Patient declined  No - Patient declined       Current Medications (verified) Outpatient Encounter Medications as of 03/15/2023  Medication Sig   acetaminophen (TYLENOL) 325 MG tablet Take 2 tablets (650 mg total) by mouth every 6 (six) hours as needed for pain.   amLODipine (NORVASC) 5 MG tablet Take 1 tablet (5 mg  total) by mouth daily.   aspirin 81 MG tablet Take 81 mg by mouth daily.   Coenzyme Q10 100 MG TABS Take 100 mg by mouth daily.    dicyclomine (BENTYL) 10 MG capsule Take 1 capsule (10 mg total) by mouth every morning. May take an additional 3 times daily as needed.   ezetimibe (ZETIA) 10 MG tablet Take 1 tablet (10 mg total) by mouth daily.   fish oil-omega-3 fatty acids 1000 MG capsule Take 1,400 mg by mouth daily.   gabapentin (NEURONTIN) 300 MG capsule Take 1 capsule (300 mg total) by mouth in the morning AND 1 capsule (300 mg total) daily at 12 noon as needed AND 2 capsules (600 mg total) at bedtime.   Glucosamine-Chondroitin-MSM 500-200-150 MG TABS Take by mouth.   hydrochlorothiazide (HYDRODIURIL) 25 MG tablet Take 1 tablet (25 mg total) by mouth daily.   latanoprost (XALATAN) 0.005 % ophthalmic solution Place 1 drop into both eyes nightly.   losartan (COZAAR) 100 MG tablet Take 1 tablet (100 mg total) by mouth daily.   meloxicam (MOBIC) 15 MG tablet Take 1 tablet (15 mg total) by mouth daily.   Plant Sterols and Stanols 450 MG TABS Take 900 mg by mouth daily.   pravastatin (PRAVACHOL) 80 MG tablet Take 1 tablet (80 mg total) by mouth daily.   traZODone (DESYREL) 50 MG tablet Take 0.5-1 tablets (25-50 mg total) by mouth at bedtime as needed for sleep.   [  DISCONTINUED] latanoprost (XALATAN) 0.005 % ophthalmic solution Place 1 drop into both eyes Nightly.   No facility-administered encounter medications on file as of 03/15/2023.    Allergies (verified) Patient has no known allergies.   History: Past Medical History:  Diagnosis Date   Arthritis    BPH (benign prostatic hyperplasia)    Bunion    Callus    Carotid artery occlusion    Coronary artery disease    left main 60% stenosis. The LAD had a proximal 60-70% stenosis. There was mid 40% stenosis. Diagonal is moderate size with 40% stenosis. The circumflex system included a large OM with 60% ostial stenosis the RCA was occluded  before the PDA. The EF was 65%.  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 when happened    Obesity    Radiculopathy    Shortness of breath dyspnea    with exertion -    Past Surgical History:  Procedure Laterality Date   APPENDECTOMY  1958   CARDIAC CATHETERIZATION  05/2004   SHOWING 100% OCCLUDED DISTAL RIGHT CORONARY WITH  LEFT  TO RIGHT COLLATERALS, AS WELL AS ANTEGRADE FLOW, NORMAL LEFT MAIN, 50% NARROWING IN LEFT CIRCUMFLEX  WITH IRREGULARITIES IN THE LAD   CARDIAC CATHETERIZATION N/A 02/12/2016   Procedure: Left Heart Cath and Coronary Angiography;  Surgeon: Marykay Lex, MD;  Location: [facility] INVASIVE CV LAB;  Service: Cardiovascular;  Laterality: N/A;   CARDIOVASCULAR STRESS TEST  02/26/2010   EF 65%, SMALL AREA OF MILD INFARCT AND POSSIBLE PER-INFARCT ISCHEMIA IN THE INFEROBASAL WALL   COLONOSCOPY     CORONARY ANGIOPLASTY     RIGHT CORONARY WERE UNSUCCESSFUL   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 OR;  Service: Open Heart Surgery;  Laterality: N/A;   HERNIA REPAIR  1960   right hernia repair   INTRAOPERATIVE TRANSESOPHAGEAL ECHOCARDIOGRAM N/A 02/25/2016   Procedure: INTRAOPERATIVE TRANSESOPHAGEAL ECHOCARDIOGRAM;  Surgeon: Loreli Slot, MD;  Location: [facility] OR;  Service: Open Heart Surgery;  Laterality: N/A;   UMBILICAL HERNIA REPAIR     US ECHOCARDIOGRAPHY  01/03/2007   EF 55-60%   Family History  Problem Relation Age of Onset   Congestive Heart Failure Mother    Diabetes Mother    Stroke Father    Hypertension Neg Hx    Colon cancer Neg Hx    Colon polyps Neg Hx    Esophageal cancer Neg Hx    Rectal cancer Neg Hx    Stomach cancer Neg Hx    Neuropathy Neg Hx     Social History   Socioeconomic History   Marital status: Married    Spouse name: Curtis Tyler   Number of children: 2   Years of education: HS   Highest education level: 11th grade  Occupational History   Occupation: retired  Tobacco Use   Smoking status: Former    Current packs/day: 0.00    Average packs/day: 1 pack/day for 25.0 years (25.0 ttl pk-yrs)    Types: Cigarettes    Start date: 07/23/1956    Quit date: 07/23/1981    Years since quitting: 41.6   Smokeless tobacco: Never  Vaping Use   Vaping status:  Never Used  Substance and Sexual Activity   Alcohol use: Yes    Alcohol/week: 7.0 standard drinks of alcohol    Types: 7 Glasses of wine per week    Comment: glass of wine a night    Drug use: No   Sexual activity: Yes  Other Topics Concern   Not on file  Social History Narrative   Positive HTN and Stroke among his parents who lived up until their late 80's   Patient lives at home with his spouse.   Caffeine Use: 2-4 cups daily   Social Determinants of Health   Financial Resource Strain: Low Risk  (11/21/2022)   Overall Financial Resource Strain (CARDIA)    Difficulty of Paying Living Expenses: Not hard at all  Food Insecurity: No Food Insecurity (11/21/2022)   Hunger Vital Sign    Worried About Running Out of Food in the Last Year: Never true    Ran Out of Food in the Last Year: Never true  Transportation Needs: No Transportation Needs (11/21/2022)   PRAPARE - Administrator, Civil Service (Medical): No    Lack of Transportation (Non-Medical): No  Physical Activity: Sufficiently Active (11/21/2022)   Exercise Vital Sign    Days of Exercise per Week: 4 days    Minutes of Exercise per Session: 40 min  Stress: No Stress Concern Present (11/21/2022)   [employer] of Occupational Health - Occupational Stress Questionnaire    Feeling of Stress : Not at all  Social Connections: Unknown (11/21/2022)   Social Connection and Isolation Panel [NHANES]     Frequency of Communication with Friends and Family: More than three times a week    Frequency of Social Gatherings with Friends and Family: Three times a week    Attends Religious Services: More than 4 times per year    Active Member of Clubs or Organizations: Not on file    Attends Banker Meetings: Not on file    Marital Status: Married    Tobacco Counseling Counseling given: Not Answered   Clinical Intake:  Pre-visit preparation completed: Yes  Pain : No/denies pain  Nutritional Risks: None Diabetes: Yes CBG done?: No Did pt. bring in CBG monitor from home?: No  How often do you need to have someone help you when you read instructions, pamphlets, or other written materials from your doctor or pharmacy?: 1 - Never  Interpreter Needed?: No  Information entered by :: Donne Anon, CMA   Activities of Daily Living    03/15/2023    9:41 AM  In your present state of health, do you have any difficulty performing the following activities:  Hearing? 1  Comment wears hearing aids  Vision? 0  Difficulty concentrating or making decisions? 1  Comment remembering  Walking or climbing stairs? 0  Dressing or bathing? 0  Doing errands, shopping? 0  Preparing Food and eating ? N  Using the Toilet? N  In the past six months, have you accidently leaked urine? N  Do you have problems with loss of bowel control? N  Managing your Medications? N  Managing your Finances? N  Housekeeping or managing your Housekeeping? N    Patient Care Team: Copland, Gwenlyn Found, MD as PCP - General (Family Medicine) Day, Dannielle Karvonen, [facility] (Inactive) as Pharmacist (Pharmacist)  Indicate any recent Medical Services you may have received from other than [facility] providers in the past year (date may be approximate).     Assessment:   This is a  routine wellness examination for [facility]  Hearing/Vision screen No results found.  Dietary issues and exercise activities discussed:     Goals  Addressed   None    Depression Screen    03/15/2023    9:48 AM 11/28/2022    3:39 PM 08/17/2022    8:50 AM 05/30/2022    1:31 PM 07/20/2021   10:01 AM 05/20/2021   11:06 AM 11/15/2019    9:53 AM  PHQ 2/9 Scores  PHQ - 2 Score 0 0 0 0 0 0 0    Fall Risk    03/15/2023    9:46 AM 11/28/2022    3:39 PM 08/17/2022    8:50 AM 05/30/2022    1:31 PM 07/20/2021   10:00 AM  Fall Risk   Falls in the past year? 1 0 0 0 0  Comment fell in the backyard      Number falls in past yr: 0 0 0 0 0  Injury with Fall? 1 0 0 0 0  Risk for fall due to : History of fall(s) No Fall Risks No Fall Risks No Fall Risks No Fall Risks  Follow up Falls evaluation completed Falls evaluation completed Falls evaluation completed Falls evaluation completed Falls evaluation completed    MEDICARE RISK AT HOME:  Medicare Risk at Home - 03/15/23 0943     Any stairs in or around the home? Yes    If so, are there any without handrails? No    Home free of loose throw rugs in walkways, pet beds, electrical cords, etc? Yes    Adequate lighting in your home to reduce risk of falls? Yes    Life alert? No    Use of a cane, walker or w/c? No    Grab bars in the bathroom? No    Shower chair or bench in shower? No    Elevated toilet seat or a handicapped toilet? No             TIMED UP AND GO:  Was the test performed?  No    Cognitive Function:        03/15/2023    9:49 AM  6CIT Screen  What Year? 0 points  What month? 0 points  What time? 0 points  Count back from 20 2 points  Months in reverse 0 points  Repeat phrase 6 points  Total Score 8 points    Immunizations Immunization History  Administered Date(s) Administered   Influenza Split 05/30/2011, 06/17/2021, 05/29/2022   PFIZER Comirnaty(Gray Top)Covid-19 Tri-Sucrose Vaccine 01/11/2021   PFIZER(Purple Top)SARS-COV-2 Vaccination 09/10/2019, 09/28/2019, 05/25/2020, 05/29/2022   Pfizer Covid-19 Vaccine Bivalent Booster 57yrs & up 07/16/2021    Pfizer Covid-19 Vaccine Bivalent Booster 5y-11y 07/14/2021   RSV,unspecified 05/29/2022   Rsv, Bivalent, Protein Subunit Rsvpref,pf Verdis Frederickson) 05/13/2022   Td 02/06/2007   Tdap 11/30/2020   Zoster Recombinant(Shingrix) 12/18/2017, 02/26/2018, 05/28/2018    TDAP status: Up to date  Flu Vaccine status: Up to date  Pneumococcal vaccine status: Up to date  Covid-19 vaccine status: Information provided on how to obtain vaccines.   Qualifies for Shingles Vaccine? Yes   Zostavax completed No   Shingrix Completed?: Yes  Screening Tests Health Maintenance  Topic Date Due   FOOT EXAM  Never done   Diabetic kidney evaluation - Urine ACR  Never done   Medicare Annual Wellness (AWV)  11/14/2020   COVID-19 Vaccine (7 - 2023-24 season) 07/24/2022   INFLUENZA VACCINE  03/30/2023   HEMOGLOBIN A1C  05/26/2023   Diabetic kidney evaluation - eGFR measurement  11/23/2023   OPHTHALMOLOGY EXAM  11/29/2023   DTaP/Tdap/Td (3 - Td or Tdap) 12/01/2030   Zoster Vaccines- Shingrix  Completed   HPV VACCINES  Aged Out   Pneumonia Vaccine 90+ Years old  Discontinued    Health Maintenance  Health Maintenance Due  Topic Date Due   FOOT EXAM  Never done   Diabetic kidney evaluation - Urine ACR  Never done   Medicare Annual Wellness (AWV)  11/14/2020   COVID-19 Vaccine (7 - 2023-24 season) 07/24/2022    Colorectal cancer screening: No longer required.   Lung Cancer Screening: (Low Dose CT Chest recommended if Age 33-80 years, 20 pack-year currently smoking OR have quit w/in 15years.) does not qualify.   Additional Screening:  Hepatitis C Screening: does not qualify  Vision Screening: Recommended annual ophthalmology exams for early detection of glaucoma and other disorders of the eye. Is the patient up to date with their annual eye exam?  Yes  Who is the provider or what is the name of the office in which the patient attends annual eye exams? Dr. Miguel Rota If pt is not established with a  provider, would they like to be referred to a provider to establish care? No .   Dental Screening: Recommended annual dental exams for proper oral hygiene  Diabetic Foot Exam: Diabetic Foot Exam: Overdue, Pt has been advised about the importance in completing this exam. Pt is scheduled for diabetic foot exam on N/a.  Community Resource Referral / Chronic Care Management: CRR required this visit?  No   CCM required this visit?  No     Plan:     I have personally reviewed and noted the following in the patient's chart:   Medical and social history Use of alcohol, tobacco or illicit drugs  Current medications and supplements including opioid prescriptions. Patient is not currently taking opioid prescriptions. Functional ability and status Nutritional status Physical activity Advanced directives List of other physicians Hospitalizations, surgeries, and ER visits in previous 12 months Vitals Screenings to include cognitive, depression, and falls Referrals and appointments  In addition, I have reviewed and discussed with patient certain preventive protocols, quality metrics, and best practice recommendations. A written personalized care plan for preventive services as well as general preventive health recommendations were provided to patient.     Donne Anon, CMA   03/15/2023   After Visit Summary: (MyChart) Due to this being a telephonic visit, the after visit summary with patients personalized plan was offered to patient via MyChart   Nurse Notes: None

## 2023-03-24 DIAGNOSIS — N401 Enlarged prostate with lower urinary tract symptoms: Secondary | ICD-10-CM | POA: Diagnosis not present

## 2023-03-24 DIAGNOSIS — R3915 Urgency of urination: Secondary | ICD-10-CM | POA: Diagnosis not present

## 2023-03-30 DIAGNOSIS — H903 Sensorineural hearing loss, bilateral: Secondary | ICD-10-CM | POA: Diagnosis not present

## 2023-04-03 ENCOUNTER — Other Ambulatory Visit: Payer: Self-pay | Admitting: Family Medicine

## 2023-04-03 ENCOUNTER — Other Ambulatory Visit: Payer: Self-pay

## 2023-04-03 ENCOUNTER — Other Ambulatory Visit (HOSPITAL_COMMUNITY): Payer: Self-pay

## 2023-04-03 DIAGNOSIS — M79604 Pain in right leg: Secondary | ICD-10-CM

## 2023-04-03 MED ORDER — GABAPENTIN 300 MG PO CAPS
ORAL_CAPSULE | ORAL | 1 refills | Status: DC
Start: 2023-04-03 — End: 2023-09-30
  Filled 2023-04-03: qty 360, 90d supply, fill #0
  Filled 2023-04-16 – 2023-06-22 (×2): qty 360, 90d supply, fill #1

## 2023-04-05 ENCOUNTER — Other Ambulatory Visit (HOSPITAL_BASED_OUTPATIENT_CLINIC_OR_DEPARTMENT_OTHER): Payer: Self-pay

## 2023-04-11 ENCOUNTER — Telehealth: Payer: Self-pay | Admitting: Family Medicine

## 2023-04-11 NOTE — Telephone Encounter (Signed)
Pt called stating that he received an EOB from [employer]: 7.17.24 stating that [employer] was not going to cover his Medicare AWV phone call. Advised that we are aware of this issue and will route a note back to look into this and call him back with info, once available.

## 2023-04-17 ENCOUNTER — Other Ambulatory Visit: Payer: Self-pay

## 2023-04-18 ENCOUNTER — Other Ambulatory Visit (HOSPITAL_COMMUNITY): Payer: Self-pay

## 2023-04-20 ENCOUNTER — Encounter: Payer: Self-pay | Admitting: Family Medicine

## 2023-04-20 DIAGNOSIS — H9190 Unspecified hearing loss, unspecified ear: Secondary | ICD-10-CM

## 2023-04-29 ENCOUNTER — Other Ambulatory Visit (HOSPITAL_COMMUNITY): Payer: Self-pay

## 2023-04-30 ENCOUNTER — Other Ambulatory Visit (HOSPITAL_COMMUNITY): Payer: Self-pay

## 2023-05-01 NOTE — Progress Notes (Deleted)
Cardiology Office Note:   Date:  05/01/2023  ID:  Curtis Tyler, DOB Mar 31, 1941, MRN [id number] PCP: [name], MD  [facility] Providers Cardiologist:  None {  History of Present Illness:   Curtis Tyler is a 82 y.o. male who presents for follow up of CAD.  He had a cardiac cath in 2014 demonstrating left main 60% stenosis. The LAD had a proximal 60-70% stenosis. There was mid 40% stenosis. Diagonal is moderate size with 40% stenosis. The circumflex system included a large OM with 60% ostial stenosis the RCA was occluded before the PDA. The EF was 65%. However, we decided to manage him medically unless he had worsening symptoms. In May 2017 I sent him for a screening POET (Plain Old Exercise Treadmill).  This was abnormal with ST depression and was a high risk study. He then underwent cardiac catheterization where he was found to have a 60% ostial left main, a 90% proximal LAD, 90% proximal circumflex, and a diffusely diseased right coronary with a chronically totally occluded posterior descending.  He was taken the operating room on 02/25/2016 for CABG x 4 by Dr. Dorris Fetch.   He had bradycardia and I stopped his beta blocker.  A Holter demonstrated sinus with Mobitz Type 1.  He did have brief runs of 2:1 block without symptoms.  There were no sustained pauses.     Since I last saw him ***   ***   he has done okay from a cardiac standpoint.  However, he is having more pain in his legs when he walks.  She is to cramping in his anterior pretibial area and somewhat behind his knees.  He has soreness in his feet to touch.  He is getting an injection in his knee but he says this is different from that.  He is not having any new swelling.  He denies any other cardiovascular symptoms such as chest discomfort, neck or arm discomfort.  He has had no new shortness of breath, PND or orthopnea.  He has had no weight gain or edema.  He is trying to walk to be active but he seems to be limited  because of the leg complaints.    ROS: ***  Studies Reviewed:    EKG:       ***  Risk Assessment/Calculations:   {Does this patient have ATRIAL FIBRILLATION?[phone]} No BP recorded.  {Refresh Note OR Click here to enter BP  :1}***        Physical Exam:   VS:  There were no vitals taken for this visit.   Wt Readings from Last 3 Encounters:  11/28/22 181 lb (82.1 kg)  08/17/22 180 lb (81.6 kg)  05/30/22 177 lb (80.3 kg)     GEN: Well nourished, well developed in no acute distress NECK: No JVD; No carotid bruits CARDIAC: ***RRR, no murmurs, rubs, gallops RESPIRATORY:  Clear to auscultation without rales, wheezing or rhonchi  ABDOMEN: Soft, non-tender, non-distended EXTREMITIES:  No edema; No deformity   ASSESSMENT AND PLAN:   CAD s/p CABG:    ***  The patient has no new sypmtoms.  No further cardiovascular testing is indicated.  We will continue with aggressive risk reduction and meds as listed.   HTN:   The blood pressure is ***   at target.  No change in therapy.    HYPERLIPIDEMIA:     LDL was ***  69 with an HDL of 66.  No change in therapy.    CAROTID STENOSIS:  This was mild on most recent imaging.  No further imaging.     LEG PAIN:   ***  He has excellent pulses.  I do not think this is vascular.  He might have neuropathy.  I have suggested benfotiamine.        {Are you ordering a CV Procedure (e.g. stress test, cath, DCCV, TEE, etc)?   Press F2        :[id number]}  Follow up ***  Signed, Rollene Rotunda, MD

## 2023-05-02 DIAGNOSIS — H903 Sensorineural hearing loss, bilateral: Secondary | ICD-10-CM | POA: Diagnosis not present

## 2023-05-02 DIAGNOSIS — H6121 Impacted cerumen, right ear: Secondary | ICD-10-CM | POA: Diagnosis not present

## 2023-05-03 ENCOUNTER — Ambulatory Visit: Payer: PPO | Admitting: Cardiology

## 2023-05-03 ENCOUNTER — Telehealth: Payer: Self-pay | Admitting: *Deleted

## 2023-05-03 DIAGNOSIS — I10 Essential (primary) hypertension: Secondary | ICD-10-CM

## 2023-05-03 DIAGNOSIS — I2510 Atherosclerotic heart disease of native coronary artery without angina pectoris: Secondary | ICD-10-CM

## 2023-05-03 DIAGNOSIS — E785 Hyperlipidemia, unspecified: Secondary | ICD-10-CM

## 2023-05-03 NOTE — Telephone Encounter (Signed)
Called patient to find out if he would be attending today's appt. Patient states he forgot.  RN reschedule for 05/25/23 at 1:30 pm Patient voiced understanding

## 2023-05-15 ENCOUNTER — Telehealth (HOSPITAL_BASED_OUTPATIENT_CLINIC_OR_DEPARTMENT_OTHER): Payer: Self-pay

## 2023-05-22 ENCOUNTER — Encounter: Payer: Self-pay | Admitting: Neurology

## 2023-05-22 ENCOUNTER — Ambulatory Visit: Payer: PPO | Admitting: Neurology

## 2023-05-22 VITALS — BP 141/70 | HR 68 | Ht 62.0 in | Wt 183.0 lb

## 2023-05-22 DIAGNOSIS — R4701 Aphasia: Secondary | ICD-10-CM | POA: Diagnosis not present

## 2023-05-22 DIAGNOSIS — R413 Other amnesia: Secondary | ICD-10-CM

## 2023-05-22 DIAGNOSIS — F03A0 Unspecified dementia, mild, without behavioral disturbance, psychotic disturbance, mood disturbance, and anxiety: Secondary | ICD-10-CM | POA: Diagnosis not present

## 2023-05-22 DIAGNOSIS — H9193 Unspecified hearing loss, bilateral: Secondary | ICD-10-CM

## 2023-05-22 NOTE — Patient Instructions (Signed)
Blood work today Neurocognitive testing - they will call to schedule If concerning for dementia specifically alzheimer's will order a CT Scan to look for amyloid in the brain and discuss medications Will keep informed of the results and follow up    Memory Compensation Strategies  Use "WARM" strategy.  W= write it down  A= associate it  R= repeat it  M= make a mental note  2.   You can keep a Glass blower/designer.  Use a 3-ring notebook with sections for the following: calendar, important names and phone numbers,  medications, doctors' names/phone numbers, lists/reminders, and a section to journal what you did  each day.   3.    Use a calendar to write appointments down.  4.    Write yourself a schedule for the day.  This can be placed on the calendar or in a separate section of the Memory Notebook.  Keeping a  regular schedule can help memory.  5.    Use medication organizer with sections for each day or morning/evening pills.  You may need help loading it  6.    Keep a basket, or pegboard by the door.  Place items that you need to take out with you in the basket or on the pegboard.  You may also want to  include a message board for reminders.  7.    Use sticky notes.  Place sticky notes with reminders in a place where the task is performed.  For example: " turn off the  stove" placed by the stove, "lock the door" placed on the door at eye level, " take your medications" on  the bathroom mirror or by the place where you normally take your medications.  8.    Use alarms/timers.  Use while cooking to remind yourself to check on food or as a reminder to take your medicine, or as a  reminder to make a call, or as a reminder to perform another task, etc.

## 2023-05-22 NOTE — Progress Notes (Signed)
[facility]    Provider:  Dr Curtis Tyler Requesting Provider: Patsy Lager Curtis Found, MD Primary Care Provider:  [name], MD  CC:  Muscle pain in the calfs, shins, no numbness or tingling in feet or pain, feeling better with changes in amlodipine and crestor but also increased gabapentin 05/11/2022 referred and seen today 05/22/2023 for memory loss.  He has a history of diabetes, coronary artery disease, status post CABG, hyperlipidemia, hypertension, carotid stenosis, BPH, lung nodule, bradycardia, abnormal stress EKG with treadmill, left lower quadrant pain, cellulitis of right lower extremity, bilateral low back pain with sciatica, bilateral hip pain, cerebrovascular disease.  05/22/2023: Patient was seen in the past for neuropathy here today for new chief complaint memory loss from Dr. Patsy Lager.  MRI of the brain in June of last year was normal as below.  Labs in June of this year 2024 showed a B12 of 345, RPR nonreactive, TSH normal, in March of this year 2024 CMP showed elevated glucose, BUN 35 and creatinine 1.26, hemoglobin A1c was 6.6 and it appears he has had prediabetes/diabetes for several years at least 1 year ago hemoglobin A1c 6.4, 2 years ago 6.4, and 3 years ago 6.3, 9 months ago 6.6.  He is concrened about short-term memory. He is here with his wife who provides much information. He tends to forget things but most bothersome is he will lose train of thought in his conversation mid word and he wants to know what is going. No hx of alzheimer's his mother was shary at 9 and father was 30 he had a stroke and lived 17 years after so unclear if memory issues. He has hearing issues for a very long time and maybe he hears sounds differently that connection is a big part. He has tried hearing aids they have not helped. There is a disconnect somewhere. 50% of the time he will repeat the wrong thing that she said often daily. Worsening.  Patient says his loss of concentration  has started in the last 6 months. Drives, doesn;t get lost, no spatial disorientation, no accidents, driving well, he plays golf, he has a social group, works on the yard, runs errands, goes shopping, doesn't stop, he pays all the bills not missing any bills, he has a system he pays his bills as they come in, not losing objects but every once in a while he forgets where he leaves is keys but did that at 18. Wife perseverates on the hearing disconnect, will send for neuropsych testing which can test for dyslexia, speech expression and receptive speech. Not asking the same questions   Reviewed notes, labs and imaging from outside physicians, which showed:  02/17/2022 EXAM: MRI HEAD WITHOUT AND WITH CONTRAST   TECHNIQUE: Multiplanar, multiecho pulse sequences of the brain and surrounding structures were obtained without and with intravenous contrast.   CONTRAST:  7.40mL GADAVIST GADOBUTROL 1 MMOL/ML IV SOLN   COMPARISON:  MR head 09/09/2013   FINDINGS: Brain: There is no acute intracranial hemorrhage, extra-axial fluid collection, or acute infarct.   Parenchymal volume is normal. The ventricles are normal in size. Gray-white differentiation is preserved. There is a minimal burden of underlying white matter microangiopathic change.   There is no suspicious parenchymal signal abnormality. There is no mass lesion. There is no abnormal enhancement. There is no mass effect or midline shift.   Vascular: Normal flow voids.   Skull and upper cervical spine: Normal marrow signal.   Sinuses/Orbits: The paranasal sinuses are clear. Bilateral  lens implants are in place. The globes and orbits are otherwise unremarkable.   Other: None.   IMPRESSION: Normal brain MRI with no acute intracranial pathology or other finding to explain the patient's symptoms.  Patient complains of symptoms per HPI as well as the following symptoms: hearing loss . Pertinent negatives and positives per HPI. All others  negative     HPI 05/11/2022:  Curtis Tyler is a 82 y.o. male here as requested by Copland, Curtis Found, MD for neuropathy.  Past medical history hypertension, coronary artery disease, cerebrovascular disease, bilateral carotid artery stenosis, lung nodule, hypogonadism in male, bilateral low back pain with sciatica, effusion of left knee, prediabetes, myalgia, hyperlipidemia, bilateral hip pain, dyslipidemia, cough, bradycardia, abnormal stress EKG with treadmill.  Several years ago started having feelings in the back of his knees warm and pain and radiates to the calfs and sometimes in his shins. Feels it in his shins and in bed at night. Nothing in the feet, feet are fine. No numbness, no tingling, no burning. Ongoing 20 years and saw neurology, nothing was Tyler(I reviewed Epic he saw Dr. Marjory Lies for headache in 2014 but I find nothing for his legs for neurology). The last neurologist decreased crestor and it helps. He can't walk around the store because of soreness, muscle soreness. Also has knee pain. He has shin pain. Sleeping with a pillow between the legs help. He has had sciatica in the past. Stretching helps, he went to PT when he had sciatica 5 years ago. No weakness. They cut the amlodipine in half and he improved. Now more in the calfs. Worse in the mornings. They stopped the cholesterol medication and is helping. Muscle pain in the calfs, shins, no numbness or tingling, feeling better with changes in amlodipine and crestor but also increased gabapentin. No thigh pain.No other focal neurologic deficits, associated symptoms, inciting events or modifiable factors.   Reviewed notes, labs and imaging from outside physicians, which showed:    03/15/2022: IMPRESSION: No evidence of right lower extreme deep venous thrombosis.  02/17/2022: IMPRESSION: Normal brain MRI with no acute intracranial pathology or other finding to explain the patient's symptoms.   I reviewed labs which include  ferritin, folate, B12, TSH, hemoglobin A1c, CMP, CBC Dec 29, 2021: All normal except glucose 120, BUN 28 creatinine 1.11 and hemoglobin A1c 6.4.  Review of Systems: Patient complains of symptoms per HPI as well as the following symptoms muscle soreness. Pertinent negatives and positives per HPI. All others negative.   Social History   Socioeconomic History   Marital status: Married    Spouse name: Marcelino Duster   Number of children: 2   Years of education: HS   Highest education level: 11th grade  Occupational History   Occupation: retired  Tobacco Use   Smoking status: Former    Current packs/day: 0.00    Average packs/day: 1 pack/day for 25.0 years (25.0 ttl pk-yrs)    Types: Cigarettes    Start date: 07/23/1956    Quit date: 07/23/1981    Years since quitting: 41.8   Smokeless tobacco: Never  Vaping Use   Vaping status: Never Used  Substance and Sexual Activity   Alcohol use: Yes    Alcohol/week: 7.0 standard drinks of alcohol    Types: 7 Glasses of wine per week    Comment: glass of wine a night    Drug use: No   Sexual activity: Yes  Other Topics Concern   Not on file  Social History Narrative  Positive HTN and Stroke among his parents who lived up until their late 80's   Patient lives at home with his spouse.   Caffeine Use: 2-4 cups daily   Social Determinants of Health   Financial Resource Strain: Low Risk  (11/21/2022)   Overall Financial Resource Strain (CARDIA)    Difficulty of Paying Living Expenses: Not hard at all  Food Insecurity: No Food Insecurity (11/21/2022)   Hunger Vital Sign    Worried About Running Out of Food in the Last Year: Never true    Ran Out of Food in the Last Year: Never true  Transportation Needs: No Transportation Needs (11/21/2022)   PRAPARE - Administrator, Civil Service (Medical): No    Lack of Transportation (Non-Medical): No  Physical Activity: Sufficiently Active (11/21/2022)   Exercise Vital Sign    Days of Exercise  per Week: 4 days    Minutes of Exercise per Session: 40 min  Stress: No Stress Concern Present (11/21/2022)   [employer] of Occupational Health - Occupational Stress Questionnaire    Feeling of Stress : Not at all  Social Connections: Unknown (11/21/2022)   Social Connection and Isolation Panel [NHANES]    Frequency of Communication with Friends and Family: More than three times a week    Frequency of Social Gatherings with Friends and Family: Three times a week    Attends Religious Services: More than 4 times per year    Active Member of Clubs or Organizations: Not on file    Attends Club or Organization Meetings: Not on file    Marital Status: Married  Intimate Partner Violence: Not At Risk (03/15/2023)   Humiliation, Afraid, Rape, and Kick questionnaire    Fear of Current or Ex-Partner: No    Emotionally Abused: No    Physically Abused: No    Sexually Abused: No    Family History  Problem Relation Age of Onset   Congestive Heart Failure Mother    Diabetes Mother    Stroke Father    Hypertension Neg Hx    Colon cancer Neg Hx    Colon polyps Neg Hx    Esophageal cancer Neg Hx    Rectal cancer Neg Hx    Stomach cancer Neg Hx    Neuropathy Neg Hx    Alzheimer's disease Neg Hx    Dementia Neg Hx     Past Medical History:  Diagnosis Date   Arthritis    BPH (benign prostatic hyperplasia)    Bunion    Callus    Carotid artery occlusion    Coronary artery disease    left main 60% stenosis. The LAD had a proximal 60-70% stenosis. There was mid 40% stenosis. Diagonal is moderate size with 40% stenosis. The circumflex system included a large OM with 60% ostial stenosis the RCA was occluded before the PDA. The EF was 65%.  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 when happened    Obesity    Radiculopathy    Shortness of breath dyspnea    with  exertion -     Patient Active Problem List   Diagnosis Date Noted   LUQ pain 09/15/2021   Educated about COVID-19 virus infection 05/11/2020   Lung nodule 04/10/2020   Elevated bilirubin 05/21/2019   Diabetes mellitus, type 2 (HCC) 05/20/2019  Dyslipidemia 05/03/2019   Bradycardia 09/12/2018   Plantar porokeratosis, acquired 05/16/2018   Abnormal stress ECG with treadmill 02/12/2016   Colonic polyp 09/21/2015   Left lower quadrant pain 09/03/2015   Cellulitis of right lower extremity 05/14/2015   Need for immunization against influenza 05/14/2015   Bilateral low back pain with sciatica 09/03/2014   Hip pain, bilateral 09/03/2014   Bilateral carotid artery stenosis 07/21/2014   Myalgia 07/21/2014   Acute pain of left knee 07/15/2014   Benign prostatic hyperplasia with lower urinary tract symptoms 07/15/2014   Effusion of left knee 07/15/2014   History of elevated prostate specific antigen (PSA) 07/15/2014   Hypertrophy of nasal turbinates 07/15/2014   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   Incomplete emptying of bladder 07/15/2014   Increased urinary frequency 07/15/2014   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   Chest pain 09/05/2012   Cough 08/06/2011   Coronary artery disease involving native coronary artery without angina pectoris 04/26/2011   Essential hypertension 04/26/2011   Hyperlipidemia with target LDL less than 70 04/26/2011   Cerebrovascular disease 04/26/2011    Past Surgical History:  Procedure Laterality Date   APPENDECTOMY  1958   CARDIAC CATHETERIZATION  05/2004   SHOWING 100% OCCLUDED DISTAL RIGHT CORONARY WITH  LEFT  TO RIGHT COLLATERALS, AS WELL AS ANTEGRADE FLOW, NORMAL LEFT MAIN, 50% NARROWING IN LEFT CIRCUMFLEX  WITH IRREGULARITIES IN THE LAD   CARDIAC CATHETERIZATION N/A 02/12/2016   Procedure: Left Heart Cath and Coronary Angiography;  Surgeon: Marykay Lex, MD;  Location: [facility] INVASIVE CV LAB;  Service: Cardiovascular;  Laterality: N/A;    CARDIOVASCULAR STRESS TEST  02/26/2010   EF 65%, SMALL AREA OF MILD INFARCT AND POSSIBLE PER-INFARCT ISCHEMIA IN THE INFEROBASAL WALL   COLONOSCOPY     CORONARY ANGIOPLASTY     RIGHT CORONARY WERE UNSUCCESSFUL   CORONARY ARTERY BYPASS GRAFT N/A 02/25/2016   Procedure: CORONARY ARTERY BYPASS GRAFTING (CABG) times four using the left internal mammary artery and the right saphenus vein ;  Surgeon: Loreli Slot, MD;  Location: [facility] OR;  Service: Open Heart Surgery;  Laterality: N/A;   HERNIA REPAIR  1960   right hernia repair   INTRAOPERATIVE TRANSESOPHAGEAL ECHOCARDIOGRAM N/A 02/25/2016   Procedure: INTRAOPERATIVE TRANSESOPHAGEAL ECHOCARDIOGRAM;  Surgeon: Loreli Slot, MD;  Location: [facility] OR;  Service: Open Heart Surgery;  Laterality: N/A;   UMBILICAL HERNIA REPAIR     US ECHOCARDIOGRAPHY  01/03/2007   EF 55-60%    Current Outpatient Medications  Medication Sig Dispense Refill   acetaminophen (TYLENOL) 325 MG tablet Take 2 tablets (650 mg total) by mouth every 6 (six) hours as needed for pain. (Patient taking differently: Take 650 mg by mouth as needed for moderate pain.) 30 tablet 0   amLODipine (NORVASC) 5 MG tablet Take 1 tablet (5 mg total) by mouth daily. 90 tablet 1   aspirin 81 MG tablet Take 81 mg by mouth daily.     Coenzyme Q10 100 MG TABS Take 100 mg by mouth daily.      dicyclomine (BENTYL) 10 MG capsule Take 1 capsule (10 mg total) by mouth every morning. May take an additional 3 times daily as needed. 50 capsule 3   ezetimibe (ZETIA) 10 MG tablet Take 1 tablet (10 mg total) by mouth daily. 90 tablet 3   fish oil-omega-3 fatty acids 1000 MG capsule Take 1,400 mg by mouth daily.     gabapentin (NEURONTIN) 300 MG capsule Take 1 capsule (300 mg total) by mouth in  the morning AND 1 capsule (300 mg total) daily at 12 noon as needed AND 2 capsules (600 mg total) at bedtime. (Patient taking differently: Take 1 capsule (300 mg total)  Pt takes 1200 mg daily ) 360 capsule 1    Glucosamine-Chondroitin-MSM 500-200-150 MG TABS Take by mouth.     hydrochlorothiazide (HYDRODIURIL) 25 MG tablet Take 1 tablet (25 mg total) by mouth daily. 90 tablet 3   latanoprost (XALATAN) 0.005 % ophthalmic solution Place 1 drop into both eyes nightly. 7.5 mL 3   losartan (COZAAR) 100 MG tablet Take 1 tablet (100 mg total) by mouth daily. 90 tablet 1   meloxicam (MOBIC) 15 MG tablet Take 1 tablet (15 mg total) by mouth daily. 30 tablet 3   Plant Sterols and Stanols 450 MG TABS Take 900 mg by mouth daily.     pravastatin (PRAVACHOL) 80 MG tablet Take 1 tablet (80 mg total) by mouth daily. 90 tablet 3   traZODone (DESYREL) 50 MG tablet Take 0.5-1 tablets (25-50 mg total) by mouth at bedtime as needed for sleep. 30 tablet 3   No current facility-administered medications for this visit.    Allergies as of 05/22/2023   (No Known Allergies)    Vitals: BP (!) 141/70   Pulse 68   Ht 5\' 2"  (1.575 m)   Wt 183 lb (83 kg)   BMI 33.47 kg/m  Last Weight:  Wt Readings from Last 1 Encounters:  05/22/23 183 lb (83 kg)   Last Height:   Ht Readings from Last 1 Encounters:  05/22/23 5\' 2"  (1.575 m)     Physical exam: Exam: Gen: NAD, conversant, well nourised, obese, well groomed                     CV: RRR, no MRG. No Carotid Bruits. No peripheral edema, warm, nontender Eyes: Conjunctivae clear without exudates or hemorrhage  Neuro: Detailed Neurologic Exam  Speech:    Speech is normal; fluent and spontaneous with normal comprehension.  Cognition:     05/22/2023    9:46 AM  MMSE - Mini Mental State Exam  Orientation to time 4  Orientation to Place 4  Registration 3  Attention/ Calculation 1  Recall 1  Language- name 2 objects 2  Language- repeat 1  Language- follow 3 step command 3  Language- read & follow direction 1  Write a sentence 1  Copy design 0  Total score 21    Cranial Nerves:    The pupils are equal, round, and reactive to light. Attempted, pupils too  small to visualize. Visual fields are full to finger confrontation. Extraocular movements are intact. Trigeminal sensation is intact and the muscles of mastication are normal. The face is symmetric. The palate elevates in the midline. Hearing intact. Voice is normal. Shoulder shrug is normal. The tongue has normal motion without fasciculations.   Coordination:    Normal  Gait:    Heel-toe intact, some imbalance with tandem gait. Slightly wide based  Motor Observation:    No asymmetry, no atrophy, and no involuntary movements noted. Tone:    Normal muscle tone.    Posture:    Posture is normal. normal erect    Strength:    Strength is V/V in the upper and lower limbs.      Sensation: intact to LT, pin prick, vibration distally     Reflex Exam:  DTR's:    Deep tendon reflexes in the upper and lower extremities are  normal bilaterally.   Toes:    The toes are downgoing bilaterally.   Clonus:    Clonus is absent.    Assessment/Plan:  Lovely 82 y.o. male here as requested by Copland, Curtis Found, MD for memory loss.  Past medical history hypertension, coronary artery disease, cerebrovascular disease, bilateral carotid artery stenosis, lung nodule, hypogonadism in male, bilateral low back pain with sciatica, effusion of left knee, prediabetes, myalgia, hyperlipidemia, bilateral hip pain, dyslipidemia, cough, bradycardia, abnormal stress EKG with treadmill.   Address hearing, multiple studies have shown hearing is important in memory.  Wife perseverates on the hearing "disconnect", will send for neuropsych testing which can test for dyslexia, speech expression and receptive speech, but if she repeats slowly he will inderstand. He cannot understand some of the things on the MMSE had to be repeated the words are not clear. Recommend wearing his hearing aids at all time (one isn't working recommend he follow up with this)  Blood work: will check ATN and apoe gener testing MMSE 21/30  MRI of  the brain, last was completed in 2023 and he states it was occurring before the MRI of the brain will not repeat at this time  After bloodwork if concerning would recommend CT Amyloid Scan  Discussed medications and the new mabs would discuss after testing  Highly encouraged multiple times to get new hearing aids or repair the existing and wear all the time  He sleeps well, he is not tired during the day, wake up refreshed, no signs per patient and wife of OSA    Orders Placed This Encounter  Procedures   ATN PROFILE   APOE Alzheimer's Risk   Ambulatory referral to Neuropsychology     Cc: Copland, Curtis Found, MD,  Copland, Curtis Found, MD  Naomie Dean, MD  [facility] [address] [place], [place] [address]  Phone [phone] Fax [phone]  I spent over 40 minutes of face-to-face and non-face-to-face time with patient on the  1. Short-term memory loss   2. Bilateral hearing loss, unspecified hearing loss type   3. Receptive aphasia   4. screen for Mild dementia without behavioral disturbance, psychotic disturbance, mood disturbance, or anxiety, unspecified dementia type (HCC)    diagnosis.  This included previsit chart review, lab review, study review, order entry, electronic health record documentation, patient education on the different diagnostic and therapeutic options, counseling and coordination of care, risks and benefits of management, compliance, or risk factor reduction

## 2023-05-23 ENCOUNTER — Other Ambulatory Visit: Payer: Self-pay

## 2023-05-24 ENCOUNTER — Telehealth: Payer: Self-pay | Admitting: Neurology

## 2023-05-24 ENCOUNTER — Encounter: Payer: Self-pay | Admitting: Neurology

## 2023-05-24 DIAGNOSIS — R4701 Aphasia: Secondary | ICD-10-CM

## 2023-05-24 DIAGNOSIS — F03A0 Unspecified dementia, mild, without behavioral disturbance, psychotic disturbance, mood disturbance, and anxiety: Secondary | ICD-10-CM

## 2023-05-24 DIAGNOSIS — R413 Other amnesia: Secondary | ICD-10-CM

## 2023-05-24 NOTE — Telephone Encounter (Signed)
Referral for neuropsychology fax to [facility]. Phone: [phone], Fax: [phone]. 

## 2023-05-24 NOTE — Progress Notes (Unsigned)
Cardiology Office Note:   Date:  05/25/2023  ID:  Curtis Tyler, DOB October 18, 1940, MRN [id number] PCP: [name], MD  [facility] Providers Cardiologist:  None {   History of Present Illness:   Curtis Tyler is a 82 y.o. male  who presents for follow up of CAD.  He had a cardiac cath in 2014 demonstrating left main 60% stenosis. The LAD had a proximal 60-70% stenosis. There was mid 40% stenosis. Diagonal is moderate size with 40% stenosis. The circumflex system included a large OM with 60% ostial stenosis the RCA was occluded before the PDA. The EF was 65%. However, we decided to manage him medically unless he had worsening symptoms. In May 2017 I sent him for a screening POET (Plain Old Exercise Treadmill).  This was abnormal with ST depression and was a high risk study. He then underwent cardiac catheterization where he was found to have a 60% ostial left main, a 90% proximal LAD, 90% proximal circumflex, and a diffusely diseased right coronary with a chronically totally occluded posterior descending.  He was taken the operating room on 02/25/2016 for CABG x 4 by Dr. Dorris Fetch.   He had bradycardia and I stopped his beta blocker.  A Holter demonstrated sinus with Mobitz Type 1.  He did have brief runs of 2:1 block without symptoms.  There were no sustained pauses.     Since I last saw him he has had no new cardiovascular complaints.  Continues to complain of leg soreness in his calf muscles that limits him in his activity.  He still does all the chores around his house.  He stays as physically active as he can.  He is not having any new cardiovascular symptoms.  He is not having any chest pressure, neck or arm discomfort.  He is not having any palpitations, presyncope or syncope.  He denies PND or orthopnea.  He has noted to have slow heart rate today as reported below.  He does not have any symptoms related to this.  ROS: As stated in the HPI and negative for all other  systems.  Studies Reviewed:    EKG:   EKG Interpretation Date/Time:  Thursday May 25 2023 13:37:40 EDT Ventricular Rate:  45 PR Interval:    QRS Duration:  112 QT Interval:  412 QTC Calculation: 356 R Axis:   -33  Text Interpretation: Sinus rhythm with 2nd degree A-V block (Mobitz I) with 2:1 A-V conduction Left axis deviation Incomplete right bundle branch block Minimal voltage criteria for LVH, may be normal variant ( R in aVL ) When compared with ECG of 26-Feb-2016 06:49, Sinus rhythm is now with 2nd degree A-V block (Mobitz I) Incomplete right bundle branch block is now Present Confirmed by Rollene Rotunda ([id number]) on 05/25/2023 1:48:35 PM    Risk Assessment/Calculations:              Physical Exam:   VS:  BP (!) 104/58   Pulse (!) 45   Ht 5\' 2"  (1.575 m)   Wt 184 lb (83.5 kg)   SpO2 96%   BMI 33.65 kg/m    Wt Readings from Last 3 Encounters:  05/25/23 184 lb (83.5 kg)  05/22/23 183 lb (83 kg)  11/28/22 181 lb (82.1 kg)     GEN: Well nourished, well developed in no acute distress NECK: No JVD; No carotid bruits CARDIAC: RRR, no murmurs, rubs, gallops RESPIRATORY:  Clear to auscultation without rales, wheezing or rhonchi  ABDOMEN: Soft, non-tender,  non-distended EXTREMITIES:  No edema; No deformity   ASSESSMENT AND PLAN:   CAD s/p CABG:   The patient has no new sypmtoms.  No further cardiovascular testing is indicated.  We will continue with aggressive risk reduction and meds as listed.  HTN:   The blood pressure is at target.no change in therapy.    HYPERLIPIDEMIA:     LDL was 91.  He has not tolerated other statins.  He is taking Zetia.  We talked about a lower cholesterol diet and he is can work on this.  No change in therapy.   CAROTID STENOSIS:   This was mild in 2022.  No further imaging. =   LEG PAIN: He has excellent pulses.  He had normal ABIs.  This has been a chronic issue.  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  No change in  therapy.  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  I would like to have him wear a 3-day Zio patch to screen for any more significant bradycardia arrhythmias.  However, he is not having any symptoms.        Follow up with me in one year.   Signed, Rollene Rotunda, MD

## 2023-05-25 ENCOUNTER — Ambulatory Visit (INDEPENDENT_AMBULATORY_CARE_PROVIDER_SITE_OTHER): Payer: PPO

## 2023-05-25 ENCOUNTER — Encounter: Payer: Self-pay | Admitting: Cardiology

## 2023-05-25 ENCOUNTER — Ambulatory Visit: Payer: PPO | Attending: Cardiology | Admitting: Cardiology

## 2023-05-25 VITALS — BP 104/58 | HR 45 | Ht 62.0 in | Wt 184.0 lb

## 2023-05-25 DIAGNOSIS — I6523 Occlusion and stenosis of bilateral carotid arteries: Secondary | ICD-10-CM | POA: Diagnosis not present

## 2023-05-25 DIAGNOSIS — I10 Essential (primary) hypertension: Secondary | ICD-10-CM

## 2023-05-25 DIAGNOSIS — I2510 Atherosclerotic heart disease of native coronary artery without angina pectoris: Secondary | ICD-10-CM

## 2023-05-25 DIAGNOSIS — E785 Hyperlipidemia, unspecified: Secondary | ICD-10-CM | POA: Diagnosis not present

## 2023-05-25 DIAGNOSIS — R001 Bradycardia, unspecified: Secondary | ICD-10-CM

## 2023-05-25 LAB — ATN PROFILE
A -- Beta-amyloid 42/40 Ratio: 0.105 (ref >0.102)
Beta-amyloid 40: 215.19 pg/mL
Beta-amyloid 42: 22.55 pg/mL
N -- NfL, Plasma: 3.47 pg/mL (ref 0.00–11.55)
T -- p-tau181: 0.96 pg/mL (ref 0.00–0.97)

## 2023-05-25 LAB — APOE ALZHEIMER'S RISK

## 2023-05-25 NOTE — Progress Notes (Unsigned)
Enrolled patient for a 3 day Zio XT monitor to be mailed to patients home

## 2023-05-25 NOTE — Patient Instructions (Signed)
Medication Instructions:  NO CHANGES  *If you need a refill on your cardiac medications before your next appointment, please call your pharmacy*    Testing/Procedures: ZIO XT- Long Term Monitor Instructions  Your physician has requested you wear a ZIO patch monitor for THREE days.  This is a single patch monitor. [employer] supplies one patch monitor per enrollment. Additional stickers are not available. Please do not apply patch if you will be having a Nuclear Stress Test,  Echocardiogram, Cardiac CT, MRI, or Chest Xray during the period you would be wearing the  monitor. The patch cannot be worn during these tests. You cannot remove and re-apply the  ZIO XT patch monitor.  Your ZIO patch monitor will be mailed 3 day USPS to your address on file. It may take 3-5 days  to receive your monitor after you have been enrolled.  Once you have received your monitor, please review the enclosed instructions. Your monitor  has already been registered assigning a specific monitor serial # to you.  Billing and Patient Assistance Program Information  We have supplied [employer] with any of your insurance information on file for billing purposes. [employer] offers a sliding scale Patient Assistance Program for patients that do not have  insurance, or whose insurance does not completely cover the cost of the ZIO monitor.  You must apply for the Patient Assistance Program to qualify for this discounted rate.  To apply, please call [employer] at [phone], select option 4, select option 2, ask to apply for  Patient Assistance Program. Meredeth Ide will ask your household income, and how many people  are in your household. They will quote your out-of-pocket cost based on that information.  [employer] will also be able to set up a 7-month, interest-free payment plan if needed.  Applying the monitor   Shave hair from upper left chest.  Hold abrader disc by orange tab. Rub abrader in 40 strokes over the upper left  chest as  indicated in your monitor instructions.  Clean area with 4 enclosed alcohol pads. Let dry.  Apply patch as indicated in monitor instructions. Patch will be placed under collarbone on left  side of chest with arrow pointing upward.  Rub patch adhesive wings for 2 minutes. Remove white label marked "1". Remove the white  label marked "2". Rub patch adhesive wings for 2 additional minutes.  While looking in a mirror, press and release button in center of patch. A small green light will  flash 3-4 times. This will be your only indicator that the monitor has been turned on.  Do not shower for the first 24 hours. You may shower after the first 24 hours.  Press the button if you feel a symptom. You will hear a small click. Record Date, Time and  Symptom in the Patient Logbook.  When you are ready to remove the patch, follow instructions on the last 2 pages of Patient  Logbook. Stick patch monitor onto the last page of Patient Logbook.  Place Patient Logbook in the blue and white box. Use locking tab on box and tape box closed  securely. The blue and white box has prepaid postage on it. Please place it in the mailbox as  soon as possible. Your physician should have your test results approximately 7 days after the  monitor has been mailed back to [facility].  Call [facility] Customer Care at [phone] if you have questions regarding  your ZIO XT patch monitor. Call them immediately if you see an  orange light blinking on your  monitor.  If your monitor falls off in less than 4 days, contact our Monitor department at [phone].  If your monitor becomes loose or falls off after 4 days call [employer] at [phone] for  suggestions on securing your monitor    Follow-Up: At [facility], you and your health needs are our priority.  As part of our continuing mission to provide you with exceptional heart care, we have created designated Provider Care Teams.  These Care  Teams include your primary Cardiologist (physician) and Advanced Practice Providers (APPs -  Physician Assistants and Nurse Practitioners) who all work together to provide you with the care you need, when you need it.  We recommend signing up for the patient portal called "MyChart".  Sign up information is provided on this After Visit Summary.  MyChart is used to connect with patients for Virtual Visits (Telemedicine).  Patients are able to view lab/test results, encounter notes, upcoming appointments, etc.  Non-urgent messages can be sent to your provider as well.   To learn more about what you can do with MyChart, go to [email or web address].    Your next appointment:    12 months with Dr. Antoine Poche

## 2023-05-27 DIAGNOSIS — R001 Bradycardia, unspecified: Secondary | ICD-10-CM

## 2023-05-29 ENCOUNTER — Other Ambulatory Visit: Payer: Self-pay | Admitting: Family Medicine

## 2023-05-29 ENCOUNTER — Other Ambulatory Visit (HOSPITAL_COMMUNITY): Payer: Self-pay

## 2023-05-29 DIAGNOSIS — E785 Hyperlipidemia, unspecified: Secondary | ICD-10-CM

## 2023-05-29 MED ORDER — PRAVASTATIN SODIUM 80 MG PO TABS
80.0000 mg | ORAL_TABLET | Freq: Every day | ORAL | 0 refills | Status: DC
  Filled 2023-05-29: qty 90, 90d supply, fill #0

## 2023-05-29 MED ORDER — TRAZODONE HCL 50 MG PO TABS
25.0000 mg | ORAL_TABLET | Freq: Every evening | ORAL | 0 refills | Status: DC | PRN
  Filled 2023-05-29: qty 90, 90d supply, fill #0

## 2023-05-29 NOTE — Progress Notes (Unsigned)
[facility] [address] [place], [place] [address] [phone]  Date:  05/31/2023   Name:  Curtis Tyler   DOB:  1941/06/03   MRN:  [id number]  PCP:  [name], MD    Chief Complaint: No chief complaint on file.   History of Present Illness:  Curtis Tyler is a 82 y.o. very pleasant male patient who presents with the following:  Patient seen today for medication refills-  history of mild diabetes, coronary artery disease status post CABG, hyperlipidemia, hypertension, carotid stenosis, BPH  Most recent visit with myself was a virtual visit in April, we also visited in person earlier in April  We noted a mild dip in GFR in March, can follow-up on this today  We noted a small lung nodule on a recent CT chest, radiology recommended follow-up in 6 months  Lab Results  Component Value Date   HGBA1C 6.6 (H) 11/23/2022    Amlodipine 5 Aspirin 81 Bentyl as needed Zetia 10 Gabapentin HCTZ 25 Losartan 100 Pravachol Trazodone Meloxicam Patient Active Problem List   Diagnosis Date Noted   LUQ pain 09/15/2021   Educated about COVID-19 virus infection 05/11/2020   Lung nodule 04/10/2020   Elevated bilirubin 05/21/2019   Diabetes mellitus, type 2 (HCC) 05/20/2019   Dyslipidemia 05/03/2019   Bradycardia 09/12/2018   Plantar porokeratosis, acquired 05/16/2018   Abnormal stress ECG with treadmill 02/12/2016   Colonic polyp 09/21/2015   Left lower quadrant pain 09/03/2015   Cellulitis of right lower extremity 05/14/2015   Need for immunization against influenza 05/14/2015   Bilateral low back pain with sciatica 09/03/2014   Hip pain, bilateral 09/03/2014   Bilateral carotid artery stenosis 07/21/2014   Myalgia 07/21/2014   Acute pain of left knee 07/15/2014   Benign prostatic hyperplasia with lower urinary tract symptoms 07/15/2014   Effusion of left knee 07/15/2014   History of elevated prostate  specific antigen (PSA) 07/15/2014   Hypertrophy of nasal turbinates 07/15/2014   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 The LAD had a proximal 60-70% stenosis. There was mid 40% stenosis. Diagonal is moderate size with 40% stenosis. The circumflex system included a large OM with 60% ostial stenosis the RCA was occluded before the PDA. The EF was 65%.  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 when happened    Obesity    Radiculopathy    Shortness of breath dyspnea    with exertion -     Past Surgical History:  Procedure Laterality Date   APPENDECTOMY  1958   CARDIAC CATHETERIZATION  05/2004   SHOWING 100% OCCLUDED DISTAL RIGHT CORONARY WITH  LEFT  TO RIGHT COLLATERALS, AS WELL  AS ANTEGRADE FLOW, NORMAL LEFT MAIN, 50% NARROWING IN LEFT CIRCUMFLEX  WITH IRREGULARITIES IN THE LAD   CARDIAC CATHETERIZATION N/A 02/12/2016   Procedure: Left Heart Cath and Coronary Angiography;  Surgeon: Marykay Lex, MD;  Location: [facility] INVASIVE CV LAB;  Service: Cardiovascular;  Laterality: N/A;   CARDIOVASCULAR STRESS TEST  02/26/2010   EF 65%, SMALL AREA OF MILD INFARCT AND POSSIBLE PER-INFARCT ISCHEMIA IN THE INFEROBASAL WALL   COLONOSCOPY     CORONARY ANGIOPLASTY     RIGHT CORONARY WERE UNSUCCESSFUL    CORONARY ARTERY BYPASS GRAFT N/A 02/25/2016   Procedure: CORONARY ARTERY BYPASS GRAFTING (CABG) times four using the left internal mammary artery and the right saphenus vein ;  Surgeon: Loreli Slot, MD;  Location: [facility] OR;  Service: Open Heart Surgery;  Laterality: N/A;   HERNIA REPAIR  1960   right hernia repair   INTRAOPERATIVE TRANSESOPHAGEAL ECHOCARDIOGRAM N/A 02/25/2016   Procedure: INTRAOPERATIVE TRANSESOPHAGEAL ECHOCARDIOGRAM;  Surgeon: Loreli Slot, MD;  Location: [facility] OR;  Service: Open Heart Surgery;  Laterality: N/A;   UMBILICAL HERNIA REPAIR     US ECHOCARDIOGRAPHY  01/03/2007   EF 55-60%    Social History   Tobacco Use   Smoking status: Former    Current packs/day: 0.00    Average packs/day: 1 pack/day for 25.0 years (25.0 ttl pk-yrs)    Types: Cigarettes    Start date: 07/23/1956    Quit date: 07/23/1981    Years since quitting: 41.8   Smokeless tobacco: Never  Vaping Use   Vaping status: Never Used  Substance Use Topics   Alcohol use: Yes    Alcohol/week: 7.0 standard drinks of alcohol    Types: 7 Glasses of wine per week    Comment: glass of wine a night    Drug use: No    Family History  Problem Relation Age of Onset   Congestive Heart Failure Mother    Diabetes Mother    Stroke Father    Hypertension Neg Hx    Colon cancer Neg Hx    Colon polyps Neg Hx    Esophageal cancer Neg Hx    Rectal cancer Neg Hx    Stomach cancer Neg Hx    Neuropathy Neg Hx    Alzheimer's disease Neg Hx    Dementia Neg Hx     No Known Allergies  Medication list has been reviewed and updated.  Current Outpatient Medications on File Prior to Visit  Medication Sig Dispense Refill   acetaminophen (TYLENOL) 325 MG tablet Take 2 tablets (650 mg total) by mouth every 6 (six) hours as needed for pain. (Patient taking differently: Take 650 mg by mouth as needed for moderate pain.) 30 tablet 0   amLODipine (NORVASC) 5 MG tablet Take 1 tablet (5 mg total) by mouth  daily. 90 tablet 1   aspirin 81 MG tablet Take 81 mg by mouth daily.     Coenzyme Q10 100 MG TABS Take 100 mg by mouth daily.      dicyclomine (BENTYL) 10 MG capsule Take 1 capsule (10 mg total) by mouth every morning. May take an additional 3 times daily as needed. 50 capsule 3   ezetimibe (ZETIA) 10 MG tablet Take 1 tablet (10 mg total) by mouth daily. 90 tablet 3   fish oil-omega-3 fatty acids 1000 MG capsule Take 1,400 mg by mouth daily.     gabapentin (NEURONTIN) 300 MG capsule Take 1 capsule (300 mg total) by mouth in the  morning AND 1 capsule (300 mg total) daily at 12 noon as needed AND 2 capsules (600 mg total) at bedtime. (Patient taking differently: Take 1 capsule (300 mg total)  Pt takes 1200 mg daily ) 360 capsule 1   Glucosamine-Chondroitin-MSM 500-200-150 MG TABS Take by mouth.     hydrochlorothiazide (HYDRODIURIL) 25 MG tablet Take 1 tablet (25 mg total) by mouth daily. 90 tablet 3   latanoprost (XALATAN) 0.005 % ophthalmic solution Place 1 drop into both eyes nightly. 7.5 mL 3   losartan (COZAAR) 100 MG tablet Take 1 tablet (100 mg total) by mouth daily. 90 tablet 1   meloxicam (MOBIC) 15 MG tablet Take 1 tablet (15 mg total) by mouth daily. 30 tablet 3   Plant Sterols and Stanols 450 MG TABS Take 900 mg by mouth daily.     pravastatin (PRAVACHOL) 80 MG tablet Take 1 tablet (80 mg total) by mouth daily. 90 tablet 0   traZODone (DESYREL) 50 MG tablet Take 0.5-1 tablets (25-50 mg total) by mouth at bedtime as needed for sleep. 90 tablet 0   No current facility-administered medications on file prior to visit.    Review of Systems:  As per HPI- otherwise negative.   Physical Examination: There were no vitals filed for this visit. There were no vitals filed for this visit. There is no height or weight on file to calculate BMI. Ideal Body Weight:    GEN: no acute distress. HEENT: Atraumatic, Normocephalic.  Ears and Nose: No external deformity. CV: RRR, No M/G/R. No JVD.  No thrill. No extra heart sounds. PULM: CTA B, no wheezes, crackles, rhonchi. No retractions. No resp. distress. No accessory muscle use. ABD: S, NT, ND, +BS. No rebound. No HSM. EXTR: No c/c/e PSYCH: Normally interactive. Conversant.  Due for foot exam  Assessment and Plan: ***  Signed Abbe Amsterdam, MD

## 2023-05-29 NOTE — Patient Instructions (Incomplete)
It was good to see you again today!   

## 2023-05-30 ENCOUNTER — Other Ambulatory Visit (HOSPITAL_COMMUNITY): Payer: Self-pay

## 2023-05-30 DIAGNOSIS — L600 Ingrowing nail: Secondary | ICD-10-CM | POA: Diagnosis not present

## 2023-05-30 DIAGNOSIS — E119 Type 2 diabetes mellitus without complications: Secondary | ICD-10-CM | POA: Diagnosis not present

## 2023-05-30 NOTE — Telephone Encounter (Signed)
[employer] sent a fax that the patient refused to schedule due to being out of network with their insurance.

## 2023-05-31 ENCOUNTER — Ambulatory Visit (INDEPENDENT_AMBULATORY_CARE_PROVIDER_SITE_OTHER): Payer: PPO | Admitting: Family Medicine

## 2023-05-31 VITALS — BP 118/60 | HR 68 | Temp 97.6°F | Resp 18 | Ht 62.0 in | Wt 178.8 lb

## 2023-05-31 DIAGNOSIS — N289 Disorder of kidney and ureter, unspecified: Secondary | ICD-10-CM

## 2023-05-31 DIAGNOSIS — I10 Essential (primary) hypertension: Secondary | ICD-10-CM | POA: Diagnosis not present

## 2023-05-31 DIAGNOSIS — E785 Hyperlipidemia, unspecified: Secondary | ICD-10-CM

## 2023-05-31 DIAGNOSIS — E1122 Type 2 diabetes mellitus with diabetic chronic kidney disease: Secondary | ICD-10-CM | POA: Diagnosis not present

## 2023-05-31 DIAGNOSIS — I2510 Atherosclerotic heart disease of native coronary artery without angina pectoris: Secondary | ICD-10-CM | POA: Diagnosis not present

## 2023-06-01 ENCOUNTER — Encounter: Payer: Self-pay | Admitting: Family Medicine

## 2023-06-01 ENCOUNTER — Encounter: Payer: Self-pay | Admitting: Psychology

## 2023-06-01 LAB — HEMOGLOBIN A1C: Hgb A1c MFr Bld: 6.7 % — ABNORMAL HIGH (ref 4.6–6.5)

## 2023-06-01 LAB — BASIC METABOLIC PANEL
BUN: 23 mg/dL (ref 6–23)
CO2: 27 meq/L (ref 19–32)
Calcium: 9.6 mg/dL (ref 8.4–10.5)
Chloride: 99 meq/L (ref 96–112)
Creatinine, Ser: 1.17 mg/dL (ref 0.40–1.50)
GFR: 57.18 mL/min — ABNORMAL LOW (ref >60.00)
Glucose, Bld: 100 mg/dL — ABNORMAL HIGH (ref 70–99)
Potassium: 3.8 meq/L (ref 3.5–5.1)
Sodium: 137 meq/L (ref 135–145)

## 2023-06-01 LAB — CBC
HCT: 47.9 % (ref 39.0–52.0)
Hemoglobin: 15.8 g/dL (ref 13.0–17.0)
MCHC: 33 g/dL (ref 30.0–36.0)
MCV: 95.3 fL (ref 78.0–100.0)
Platelets: 204 10*3/uL (ref 150.0–400.0)
RBC: 5.03 Mil/uL (ref 4.22–5.81)
RDW: 12.9 % (ref 11.5–15.5)
WBC: 7.4 10*3/uL (ref 4.0–10.5)

## 2023-06-01 LAB — LIPID PANEL
Cholesterol: 189 mg/dL (ref 0–200)
HDL: 57.3 mg/dL (ref >39.00)
LDL Cholesterol: 96 mg/dL (ref 0–99)
NonHDL: 131.3
Total CHOL/HDL Ratio: 3
Triglycerides: 175 mg/dL — ABNORMAL HIGH (ref 0.0–149.0)
VLDL: 35 mg/dL (ref 0.0–40.0)

## 2023-06-01 LAB — MICROALBUMIN / CREATININE URINE RATIO
Creatinine,U: 92.7 mg/dL
Microalb Creat Ratio: 1.9 mg/g (ref 0.0–30.0)
Microalb, Ur: 1.7 mg/dL (ref 0.0–1.9)

## 2023-06-05 ENCOUNTER — Ambulatory Visit (HOSPITAL_BASED_OUTPATIENT_CLINIC_OR_DEPARTMENT_OTHER)
Admission: RE | Admit: 2023-06-05 | Discharge: 2023-06-05 | Disposition: A | Discharge status: Home / Self Care | Payer: PPO | Source: Ambulatory Visit | Attending: Family Medicine | Admitting: Family Medicine

## 2023-06-05 DIAGNOSIS — R911 Solitary pulmonary nodule: Secondary | ICD-10-CM | POA: Diagnosis not present

## 2023-06-05 DIAGNOSIS — I700 Atherosclerosis of aorta: Secondary | ICD-10-CM | POA: Diagnosis not present

## 2023-06-05 DIAGNOSIS — J432 Centrilobular emphysema: Secondary | ICD-10-CM | POA: Diagnosis not present

## 2023-06-05 DIAGNOSIS — R918 Other nonspecific abnormal finding of lung field: Secondary | ICD-10-CM | POA: Diagnosis not present

## 2023-06-05 DIAGNOSIS — J479 Bronchiectasis, uncomplicated: Secondary | ICD-10-CM | POA: Diagnosis not present

## 2023-06-07 ENCOUNTER — Ambulatory Visit: Payer: PPO | Admitting: Family Medicine

## 2023-06-08 ENCOUNTER — Encounter: Payer: Self-pay | Admitting: Family Medicine

## 2023-06-12 ENCOUNTER — Telehealth: Payer: Self-pay

## 2023-06-12 ENCOUNTER — Telehealth: Payer: Self-pay | Admitting: Cardiology

## 2023-06-12 ENCOUNTER — Encounter: Payer: Self-pay | Admitting: Cardiology

## 2023-06-12 DIAGNOSIS — R001 Bradycardia, unspecified: Secondary | ICD-10-CM | POA: Diagnosis not present

## 2023-06-12 NOTE — Telephone Encounter (Signed)
Spoke Curtis Tyler from [facility] calling with end of summary report. Patient wore monitor form 05/27/23- 05/30/23  Complete heart block 30 bpm for 7.8 secs page 6 strip 4

## 2023-06-12 NOTE — Telephone Encounter (Signed)
[employer] calling to report abnormal test results. Please advise

## 2023-06-12 NOTE — Telephone Encounter (Signed)
Spoke with DOD for patient end of summary report. And he recommended appointment with APP.  Spoke with patient and he is scheduled with APP for Friday 10/18. That is what worked for him. He is asymptomatic.

## 2023-06-12 NOTE — Telephone Encounter (Signed)
Addressed in phone note from 06/12/23. See separate telephone encounter.

## 2023-06-13 DIAGNOSIS — H401131 Primary open-angle glaucoma, bilateral, mild stage: Secondary | ICD-10-CM | POA: Diagnosis not present

## 2023-06-13 DIAGNOSIS — H52203 Unspecified astigmatism, bilateral: Secondary | ICD-10-CM | POA: Diagnosis not present

## 2023-06-13 DIAGNOSIS — H524 Presbyopia: Secondary | ICD-10-CM | POA: Diagnosis not present

## 2023-06-14 ENCOUNTER — Encounter: Payer: Self-pay | Admitting: Cardiology

## 2023-06-14 NOTE — Progress Notes (Deleted)
Cardiology Clinic Note   Patient Name: Curtis Tyler Date of Encounter: 06/14/2023  Primary Care Provider:  [name], MD Primary Cardiologist:  None  Patient Profile    ***  Past Medical History    Past Medical History:  Diagnosis Date   Arthritis    BPH (benign prostatic hyperplasia)    Bunion    Callus    Carotid artery occlusion    Coronary artery disease    left main 60% stenosis. The LAD had a proximal 60-70% stenosis. There was mid 40% stenosis. Diagonal is moderate size with 40% stenosis. The circumflex system included a large OM with 60% ostial stenosis the RCA was occluded before the PDA. The EF was 65%.  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 when happened    Obesity    Radiculopathy    Shortness of breath dyspnea    with exertion -    Past Surgical History:  Procedure Laterality Date   APPENDECTOMY  1958   CARDIAC CATHETERIZATION  05/2004   SHOWING 100% OCCLUDED DISTAL RIGHT CORONARY WITH  LEFT  TO RIGHT COLLATERALS, AS WELL AS ANTEGRADE FLOW, NORMAL LEFT MAIN, 50% NARROWING IN LEFT CIRCUMFLEX  WITH IRREGULARITIES IN THE LAD   CARDIAC CATHETERIZATION N/A 02/12/2016   Procedure: Left Heart Cath and Coronary Angiography;  Surgeon: Marykay Lex, MD;  Location: [facility] INVASIVE CV LAB;  Service: Cardiovascular;  Laterality: N/A;   CARDIOVASCULAR STRESS TEST  02/26/2010   EF 65%, SMALL AREA OF MILD INFARCT AND POSSIBLE PER-INFARCT ISCHEMIA IN THE INFEROBASAL WALL   COLONOSCOPY     CORONARY ANGIOPLASTY     RIGHT CORONARY WERE UNSUCCESSFUL   CORONARY ARTERY BYPASS GRAFT N/A 02/25/2016   Procedure: CORONARY ARTERY BYPASS GRAFTING (CABG) times four using the left internal mammary artery and the right saphenus vein ;  Surgeon: Loreli Slot, MD;  Location: [facility] OR;  Service: Open Heart Surgery;  Laterality: N/A;   HERNIA REPAIR   1960   right hernia repair   INTRAOPERATIVE TRANSESOPHAGEAL ECHOCARDIOGRAM N/A 02/25/2016   Procedure: INTRAOPERATIVE TRANSESOPHAGEAL ECHOCARDIOGRAM;  Surgeon: Loreli Slot, MD;  Location: [facility] OR;  Service: Open Heart Surgery;  Laterality: N/A;   UMBILICAL HERNIA REPAIR     US ECHOCARDIOGRAPHY  01/03/2007   EF 55-60%    Allergies  No Known Allergies  History of Present Illness    ***  Home Medications    Prior to Admission medications   Medication Sig Start Date End Date Taking? Authorizing Provider  acetaminophen (TYLENOL) 325 MG tablet Take 2 tablets (650 mg total) by mouth every 6 (six) hours as needed for pain. Patient taking differently: Take 650 mg by mouth as needed for moderate pain. 06/01/22   Wallis Bamberg, PA-C  amLODipine (NORVASC) 5 MG tablet Take 1 tablet (5 mg total) by mouth daily. 01/20/23   Copland, Gwenlyn Found, MD  aspirin 81 MG tablet Take 81 mg by mouth daily.    [provider]  Coenzyme Q10 100 MG TABS Take 100 mg by mouth daily.     [provider]  dicyclomine (BENTYL) 10 MG capsule Take 1 capsule (10 mg total) by mouth every morning. May take an additional 3 times daily as needed. 10/21/22   Zehr, Princella Pellegrini, PA-C  ezetimibe (ZETIA) 10 MG tablet Take 1 tablet (10  mg total) by mouth daily. 02/03/23   Copland, Gwenlyn Found, MD  fish oil-omega-3 fatty acids 1000 MG capsule Take 1,400 mg by mouth daily.    [provider]  gabapentin (NEURONTIN) 300 MG capsule Take 1 capsule (300 mg total) by mouth in the morning AND 1 capsule (300 mg total) daily at 12 noon as needed AND 2 capsules (600 mg total) at bedtime. Patient taking differently: Take 1 capsule (300 mg total)  Pt takes 1200 mg daily  04/03/23   Copland, Gwenlyn Found, MD  Glucosamine-Chondroitin-MSM 500-200-150 MG TABS Take by mouth.    [provider]  hydrochlorothiazide (HYDRODIURIL) 25 MG tablet Take 1 tablet (25 mg total) by mouth daily. 10/26/22   Copland, Gwenlyn Found, MD   latanoprost (XALATAN) 0.005 % ophthalmic solution Place 1 drop into both eyes nightly. 02/07/22     losartan (COZAAR) 100 MG tablet Take 1 tablet (100 mg total) by mouth daily. 02/08/23   Copland, Gwenlyn Found, MD  meloxicam (MOBIC) 15 MG tablet Take 1 tablet (15 mg total) by mouth daily. 09/26/22     Plant Sterols and Stanols 450 MG TABS Take 900 mg by mouth daily.    [provider]  pravastatin (PRAVACHOL) 80 MG tablet Take 1 tablet (80 mg total) by mouth daily. 05/29/23   Copland, Gwenlyn Found, MD  traZODone (DESYREL) 50 MG tablet Take 0.5-1 tablets (25-50 mg total) by mouth at bedtime as needed for sleep. 05/29/23   Copland, Gwenlyn Found, MD    Family History    Family History  Problem Relation Age of Onset   Congestive Heart Failure Mother    Diabetes Mother    Stroke Father    Hypertension Neg Hx    Colon cancer Neg Hx    Colon polyps Neg Hx    Esophageal cancer Neg Hx    Rectal cancer Neg Hx    Stomach cancer Neg Hx    Neuropathy Neg Hx    Alzheimer's disease Neg Hx    Dementia Neg Hx    He indicated that his mother is deceased. He indicated that his father is deceased. He indicated that his maternal grandmother is deceased. He indicated that his maternal grandfather is deceased. He indicated that his paternal grandmother is deceased. He indicated that his paternal grandfather is deceased. He indicated that the status of his neg hx is unknown.  Social History    Social History   Socioeconomic History   Marital status: Married    Spouse name: Marcelino Duster   Number of children: 2   Years of education: HS   Highest education level: 11th grade  Occupational History   Occupation: retired  Tobacco Use   Smoking status: Former    Current packs/day: 0.00    Average packs/day: 1 pack/day for 25.0 years (25.0 ttl pk-yrs)    Types: Cigarettes    Start date: 07/23/1956    Quit date: 07/23/1981    Years since quitting: 41.9   Smokeless tobacco: Never  Vaping Use   Vaping status:  Never Used  Substance and Sexual Activity   Alcohol use: Yes    Alcohol/week: 7.0 standard drinks of alcohol    Types: 7 Glasses of wine per week    Comment: glass of wine a night    Drug use: No   Sexual activity: Yes  Other Topics Concern   Not on file  Social History Narrative   Positive HTN and Stroke among his parents who lived up until their  late 36's   Patient lives at home with his spouse.   Caffeine Use: 2-4 cups daily   Social Determinants of Health   Financial Resource Strain: Low Risk  (11/21/2022)   Overall Financial Resource Strain (CARDIA)    Difficulty of Paying Living Expenses: Not hard at all  Food Insecurity: No Food Insecurity (11/21/2022)   Hunger Vital Sign    Worried About Running Out of Food in the Last Year: Never true    Ran Out of Food in the Last Year: Never true  Transportation Needs: No Transportation Needs (11/21/2022)   PRAPARE - Administrator, Civil Service (Medical): No    Lack of Transportation (Non-Medical): No  Physical Activity: Sufficiently Active (11/21/2022)   Exercise Vital Sign    Days of Exercise per Week: 4 days    Minutes of Exercise per Session: 40 min  Stress: No Stress Concern Present (11/21/2022)   [employer] of Occupational Health - Occupational Stress Questionnaire    Feeling of Stress : Not at all  Social Connections: Unknown (11/21/2022)   Social Connection and Isolation Panel [NHANES]    Frequency of Communication with Friends and Family: More than three times a week    Frequency of Social Gatherings with Friends and Family: Three times a week    Attends Religious Services: More than 4 times per year    Active Member of Clubs or Organizations: Not on file    Attends Banker Meetings: Not on file    Marital Status: Married  Intimate Partner Violence: Not At Risk (03/15/2023)   Humiliation, Afraid, Rape, and Kick questionnaire    Fear of Current or Ex-Partner: No    Emotionally Abused: No     Physically Abused: No    Sexually Abused: No     Review of Systems    General:  No chills, fever, night sweats or weight changes.  Cardiovascular:  No chest pain, dyspnea on exertion, edema, orthopnea, palpitations, paroxysmal nocturnal dyspnea. Dermatological: No rash, lesions/masses Respiratory: No cough, dyspnea Urologic: No hematuria, dysuria Abdominal:   No nausea, vomiting, diarrhea, bright red blood per rectum, melena, or hematemesis Neurologic:  No visual changes, wkns, changes in mental status. All other systems reviewed and are otherwise negative except as noted above.  Physical Exam    VS:  There were no vitals taken for this visit. , BMI There is no height or weight on file to calculate BMI. GEN: Well nourished, well developed, in no acute distress. HEENT: normal. Neck: Supple, no JVD, carotid bruits, or masses. Cardiac: RRR, no murmurs, rubs, or gallops. No clubbing, cyanosis, edema.  Radials/DP/PT 2+ and equal bilaterally.  Respiratory:  Respirations regular and unlabored, clear to auscultation bilaterally. GI: Soft, nontender, nondistended, BS + x 4. MS: no deformity or atrophy. Skin: warm and dry, no rash. Neuro:  Strength and sensation are intact. Psych: Normal affect.  Accessory Clinical Findings    Recent Labs: 11/23/2022: ALT 20 02/08/2023: TSH 2.82 05/31/2023: BUN 23; Creatinine, Ser 1.17; Hemoglobin 15.8; Platelets 204.0; Potassium 3.8; Sodium 137   Recent Lipid Panel    Component Value Date/Time   CHOL 189 05/31/2023 1538   TRIG 175.0 (H) 05/31/2023 1538   HDL 57.30 05/31/2023 1538   CHOLHDL 3 05/31/2023 1538   VLDL 35.0 05/31/2023 1538   LDLCALC 96 05/31/2023 1538   LDLDIRECT 91.0 11/23/2022 0801    No BP recorded.  {Refresh Note OR Click here to enter BP  :1}***  ECG personally reviewed by me today- ***          Assessment & Plan   1.  ***   Thomasene Ripple. Dj Senteno NP-C     06/14/2023, 8:50 AM [facility]  [facility] [address] Office [phone] Fax [phone]    I spent***minutes examining this patient, reviewing medications, and using patient centered shared decision making involving her cardiac care.   I spent greater than 20 minutes reviewing her past medical history,  medications, and prior cardiac tests.

## 2023-06-15 NOTE — Progress Notes (Signed)
Cardiology Office Note    Date:  06/16/2023  ID:  Markevis Menzel, DOB [birth year], MRN [id number] PCP:  [name], MD  Cardiologist:  Rollene Rotunda, MD  Electrophysiologist:  None   Chief Complaint: Bradycardia, cardiac monitor results   History of Present Illness: .    Barron Binner is a 82 y.o. male with visit-pertinent history of CAD s/p CABGx4 (LIMA to LAD, SVG to posterior descending, sequential saphenous vein graft to obtuse marginals of 1 and 2), bradycardia, hypertension, hyperlipidemia, bilateral carotid artery stenosis, remote tobacco use.  Cardiac catheterization in 2014 demonstrated left main 60% stenosis, LAD with proximal 60 to 70% stenosis, mid 40% stenosis, EF at that time was 65%.  He was managed medically at that time.  In May 2017 he had a poet that was abnormal with ST depression and a high risk study.  Cardiac cath at that time indicated 60% ostial left main, 90% proximal LAD, 90% proximal left circumflex and a diffusely diseased RCA with a chronically totally occluded posterior descending.  He underwent CABG x 4 on 02/25/19 x 18 by Dr. Dorris Fetch.  In 08/2018 he wore a Holter monitor for bradycardia which showed Mobitz type I, no sustained pauses, 2-1 block transient.  He was last seen by Dr. Antoine Poche on 05/25/2023.  He had remained stable from a cardiac perspective.  He reported leg soreness in his calf muscles that was limiting his activity, noted to be a chronic issue with history of normal ABIs and excellent pulses.  He was noted to have 2-1, Mobitz type I heart block on his EKG it was recommended he wear a 3-day Zio patch to screen for any significant bradycardia, he denied having any symptoms.  His cardiac monitor showed evidence of first-degree AV block, first-degree type II block and brief run of possible third-degree AV block during sleeping hours.  Today patient presents with his wife for follow-up regarding cardiac monitor.  He reports he is doing well, he  has no cardiac concerns or complaints today.  He denies chest pain, shortness of breath, palpitations, dizziness, lightheadedness presyncope or syncope.  He has maintained his normal activity, he does note that in the past few years he feels as though he fatigues slightly faster than he previously did.  We reviewed the results of his cardiac monitor and possible findings of complete heart block, he has been asymptomatic.  We discussed referral to EP, which he would greatly appreciate.  Labwork independently reviewed: 05/31/2023: Sodium 137, potassium 3.8, creatinine 1.17, total cholesterol 189, triglycerides 175, HDL 57 and LDL 96.  ROS: .   Today he denies chest pain, shortness of breath, lower extremity edema, fatigue, palpitations, melena, hematuria, hemoptysis, diaphoresis, weakness, presyncope, syncope, orthopnea, and PND.  All other systems are reviewed and otherwise negative. Objective   Studies Reviewed: Marland Kitchen    EKG:  EKG not ordered today.   CV Studies:  Cardiac Studies & Procedures   CARDIAC CATHETERIZATION  CARDIAC CATHETERIZATION 02/12/2016  Narrative Images from the original result were not included.  Ost LM lesion, 60% stenosed. LM lesion, 50% stenosed. Calcified  Prox LAD to Mid LAD lesion, 90% stenosed. Calcified - napkin-ring like lesion between D1 & D2.  Prox Cx lesion, 95% stenosed. Calcified lesion at the takeoff of OM1  Prox RCA to Dist RCA lesion, 60% stenosed.  Dist RCA lesion, 100% stenosed. Known CTO with bridging collaterals and left-to-right collaterals filling the distal RCA system  The left ventricular systolic function is normal.  Normal  LVEDP  Patient clearly has progression of disease from 2014 most notably in the proximal LAD and proximal circumflex. Visually, the left main disease also appears to be worse especially the ostial lesion.  Likely reason for his now abnormal treadmill stress test is progression of disease in the vessel providing  collaterals to the inferior wall.  I discussed the findings with Dr. Antoine Poche at the end the procedure, he agrees with my interpretation and feel strongly that we should refer the patient for CABG at this time. He feels strongly the patient should probably have this done and not travel to [place] as planned.  I have contacted the CT surgery consult service. My definitive the plan will be for him to be seen early next week for CABG to be scheduled. They will stop Plavix at that time.  I spent roughly 30 minutes discussing the findings and recommendations/plan with the patient's wife following the procedure.  Plan: Discharge home today after bedrest and TR band removal. Continue current medications. Anticipate that he'll be scheduled to seen by a CT surgeon early next week for scheduling of CABG   Bryan Lemma, M.D., M.S. Interventional Cardiologist  Pager # [phone] Phone # [phone] [address] [place], [place] [address]  Findings Coronary Findings Diagnostic  Dominance: Right  Left Main . Vessel is large. Tubular located proximal to major branch. Moderately Calcified tubular located proximal to major branch.  Left Anterior Descending Moderately Calcified diffuse. Moderately Calcified discrete.  Progression of disease. Appears to be a napkin ring lesion with poststenotic dilatation  First Diagonal Branch The vessel is moderate in size and exhibits minimal luminal irregularities.  First Septal Branch The vessel is moderate in size.  Second Septal Branch The vessel is moderate in size.  Third Diagonal Branch The vessel is small in size.  Third Septal Branch The vessel is small in size.  Left Circumflex Severely Calcified eccentric.  Definitely progression of disease  First Obtuse Marginal Branch The vessel is moderate in size.  Lateral First Obtuse Marginal Branch The vessel is small in size.  Lateral Second Obtuse Marginal Branch The  vessel is small in size.  Right Coronary Artery . Vessel is large. Moderately Calcified diffuse. Calcified discrete chronic total occlusionand has bridging left-to-right right-to-right collateral flow. Unsuccessful attempted PCI in 2005  Acute Marginal Branch The vessel is small in size.  Inferior Septal The vessel is small in size. Collaterals Inf Sept filled by collaterals from 1st Sept.  Right Posterior Atrioventricular Artery The vessel is moderate in size. Collaterals RPAV filled by collaterals from Dist Cx.  First Right Posterolateral Branch The vessel is small in size. Collaterals 1st RPL filled by collaterals from Lat 2nd Mrg.  Second Right Posterolateral Branch The vessel is small in size. Collaterals 2nd RPL filled by collaterals from 2nd Mrg.  Intervention  No interventions have been documented.   STRESS TESTS  EXERCISE TOLERANCE TEST (ETT) 02/09/2016  Narrative  Downsloping ST segment depression ST segment depression of 2 mm was noted during stress in the II, III, aVF, V4, V5 and V6 leads. There is ST elevation as well in aVR.  Abnormal high risk exercise treadmill test with electrocardiographic evidence of ischemia, possibly global ischemia.  Blood pressure overall decreased as exercise intensified. Shortness of breath.  Donato Schultz, MD     MONITORS  LONG TERM MONITOR (3-14 DAYS) 06/12/2023  Narrative Normal sinus rhythm First degree AV block First degree Type two block Brief run of possible third degree  AV block during the sleeping hours.           Current Reported Medications:.    Current Meds  Medication Sig   acetaminophen (TYLENOL) 325 MG tablet Take 2 tablets (650 mg total) by mouth every 6 (six) hours as needed for pain.   amLODipine (NORVASC) 5 MG tablet Take 1 tablet (5 mg total) by mouth daily.   aspirin 81 MG tablet Take 81 mg by mouth daily.   Coenzyme Q10 100 MG TABS Take 100 mg by mouth daily.    dicyclomine (BENTYL) 10 MG  capsule Take 1 capsule (10 mg total) by mouth every morning. May take an additional 3 times daily as needed.   ezetimibe (ZETIA) 10 MG tablet Take 1 tablet (10 mg total) by mouth daily.   fish oil-omega-3 fatty acids 1000 MG capsule Take 1,400 mg by mouth daily.   gabapentin (NEURONTIN) 300 MG capsule Take 1 capsule (300 mg total) by mouth in the morning AND 1 capsule (300 mg total) daily at 12 noon as needed AND 2 capsules (600 mg total) at bedtime. (Patient taking differently: Take 1 capsule (300 mg total)  Pt takes 1200 mg daily )   Glucosamine-Chondroitin-MSM 500-200-150 MG TABS Take by mouth.   hydrochlorothiazide (HYDRODIURIL) 25 MG tablet Take 1 tablet (25 mg total) by mouth daily.   latanoprost (XALATAN) 0.005 % ophthalmic solution Place 1 drop into both eyes nightly.   losartan (COZAAR) 100 MG tablet Take 1 tablet (100 mg total) by mouth daily.   meloxicam (MOBIC) 15 MG tablet Take 1 tablet (15 mg total) by mouth daily.   Plant Sterols and Stanols 450 MG TABS Take 900 mg by mouth daily.   pravastatin (PRAVACHOL) 80 MG tablet Take 1 tablet (80 mg total) by mouth daily.   traZODone (DESYREL) 50 MG tablet Take 0.5-1 tablets (25-50 mg total) by mouth at bedtime as needed for sleep.   Physical Exam:   VS:  BP 120/62 (BP Location: Right Arm, Patient Position: Sitting, Cuff Size: Normal)   Pulse 65   Ht 5\' 2"  (1.575 m)   Wt 179 lb 12.8 oz (81.6 kg)   SpO2 97%   BMI 32.89 kg/m    Wt Readings from Last 3 Encounters:  06/16/23 179 lb 12.8 oz (81.6 kg)  05/31/23 178 lb 12.8 oz (81.1 kg)  05/25/23 184 lb (83.5 kg)    GEN: Well nourished, well developed in no acute distress NECK: No JVD; No carotid bruits CARDIAC: RRR, no murmurs, rubs, gallops RESPIRATORY:  Clear to auscultation without rales, wheezing or rhonchi  ABDOMEN: Soft, non-tender, non-distended EXTREMITIES:  No edema; No acute deformity     Asessement and Plan:.   Bradycardia/?Complete heart block: Know history of mobitz  type I, recent cardiac monitor showed evidence of first-degree AV block, first-degree type II block and brief run of possible third-degree AV block during sleeping hours.  Today patient denies any symptoms of bradycardia.  He does note in recent years that he feels he fatigues slightly faster than he used to, but denies any new symptoms. We reviewed ED precautions. Recommended that he discontinue use of trazodone until seen by Dr. Elberta Fortis as this medication can result in bradycardia and heart block. Patient remains asymptomatic, he requests referral to EP. This is not an urgent referral, do not anticipate need for pacemaker at this time.  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   CAD: s/p CABGx4 in 2017. Stable with no  anginal symptoms. No indication for ischemic evaluation.  Continue amlodipine 5 mg daily, aspirin 81 mg daily, Zetia 10 mg daily, hydrochlorothiazide 25 mg daily, losartan 100 mg daily and pravastatin 80 mg daily.   HTN: Blood pressure today 120/62. Continue antihypertensive regimen as noted above.  HLD: Last lipid profile on 05/31/2023 indicated total cholesterol 189, triglycerides 175, HDL 57, LDL 96. History of intolerance to high intensity statins.  Recommended referral to lipid clinic for consideration of PCSK9 inhibitor, patient is agreeable.  Carotid disease: Noted to have mild bilateral carotid stenosis in 2022.  Per Dr. Antoine Poche no further imaging indicated.  Disposition: F/u with Dr. Elberta Fortis at next available appointment, follow up with Dr. Antoine Poche in 6 months (patient request).   Signed, Rip Harbour, NP

## 2023-06-16 ENCOUNTER — Encounter: Payer: Self-pay | Admitting: Physician Assistant

## 2023-06-16 ENCOUNTER — Ambulatory Visit: Payer: PPO | Attending: General Practice | Admitting: Cardiology

## 2023-06-16 VITALS — BP 120/62 | HR 65 | Ht 62.0 in | Wt 179.8 lb

## 2023-06-16 DIAGNOSIS — I10 Essential (primary) hypertension: Secondary | ICD-10-CM | POA: Diagnosis not present

## 2023-06-16 DIAGNOSIS — I459 Conduction disorder, unspecified: Secondary | ICD-10-CM

## 2023-06-16 DIAGNOSIS — I2510 Atherosclerotic heart disease of native coronary artery without angina pectoris: Secondary | ICD-10-CM | POA: Diagnosis not present

## 2023-06-16 DIAGNOSIS — R001 Bradycardia, unspecified: Secondary | ICD-10-CM | POA: Diagnosis not present

## 2023-06-16 DIAGNOSIS — E785 Hyperlipidemia, unspecified: Secondary | ICD-10-CM | POA: Diagnosis not present

## 2023-06-16 NOTE — Patient Instructions (Signed)
Medication Instructions:  NO CHANGES *If you need a refill on your cardiac medications before your next appointment, please call your pharmacy*   Lab Work: NO LABS If you have labs (blood work) drawn today and your tests are completely normal, you will receive your results only by: MyChart Message (if you have MyChart) OR A paper copy in the mail If you have any lab test that is abnormal or we need to change your treatment, we will call you to review the results.   Testing/Procedures: NO TESTING   Follow-Up: At [facility], you and your health needs are our priority.  As part of our continuing mission to provide you with exceptional heart care, we have created designated Provider Care Teams.  These Care Teams include your primary Cardiologist (physician) and Advanced Practice Providers (APPs -  Physician Assistants and Nurse Practitioners) who all work together to provide you with the care you need, when you need it.   Your next appointment:   6 month(s)  Provider:   Rollene Rotunda, MD   Other Instructions Referral to Electrophysiology Referral to Pharm[facility] Lipid Clinic

## 2023-06-19 ENCOUNTER — Encounter: Payer: Self-pay | Admitting: Family Medicine

## 2023-06-19 NOTE — Telephone Encounter (Signed)
Pt has been informed already about imaging issue.

## 2023-06-20 ENCOUNTER — Telehealth: Payer: Self-pay | Admitting: Neurology

## 2023-06-20 NOTE — Telephone Encounter (Signed)
Pt would like a call from the nurse to discuss order for bloodwork. Insurance contacted to make me aware denied coverage blood work due to no referral/authorization on file.

## 2023-06-20 NOTE — Telephone Encounter (Signed)
LMVM for pt that we returned call.

## 2023-06-20 NOTE — Telephone Encounter (Signed)
See mychart.  

## 2023-06-20 NOTE — Telephone Encounter (Signed)
Pt returned call.  This concerning labs that he had done.  (Insurance needing authorization for these).  He will try to scan to mychart for me to see.  I did not see that have come in for [place] ? From [facility].

## 2023-06-22 ENCOUNTER — Other Ambulatory Visit (HOSPITAL_COMMUNITY): Payer: Self-pay

## 2023-06-22 ENCOUNTER — Other Ambulatory Visit: Payer: Self-pay

## 2023-06-22 MED ORDER — LATANOPROST 0.005 % OP SOLN
1.0000 [drp] | Freq: Every evening | OPHTHALMIC | 3 refills | Status: DC
  Filled 2023-06-22 (×2): qty 7.5, 60d supply, fill #0
  Filled 2023-09-07: qty 7.5, 75d supply, fill #1
  Filled 2023-12-17: qty 7.5, 75d supply, fill #2
  Filled 2024-05-07: qty 7.5, 75d supply, fill #3

## 2023-06-23 ENCOUNTER — Ambulatory Visit: Payer: PPO | Admitting: General Practice

## 2023-07-04 ENCOUNTER — Ambulatory Visit: Payer: PPO | Admitting: Gastroenterology

## 2023-07-04 ENCOUNTER — Encounter: Payer: Self-pay | Admitting: Gastroenterology

## 2023-07-04 ENCOUNTER — Telehealth: Payer: Self-pay | Admitting: Gastroenterology

## 2023-07-04 VITALS — BP 120/64 | HR 68 | Ht 62.0 in | Wt 178.2 lb

## 2023-07-04 DIAGNOSIS — R1012 Left upper quadrant pain: Secondary | ICD-10-CM

## 2023-07-04 DIAGNOSIS — R1032 Left lower quadrant pain: Secondary | ICD-10-CM

## 2023-07-04 DIAGNOSIS — R110 Nausea: Secondary | ICD-10-CM

## 2023-07-04 NOTE — Progress Notes (Signed)
07/04/2023 Curtis Tyler [id number] March 20, 1941   HISTORY OF PRESENT ILLNESS:  This is an 82 year old male who is a patient of Dr. Orvan Falconer previously.  His care will be assigned to Dr. Leonides Schanz.  He is here today with complaints of left mid-abdominal pain that occurs intermittently for years.  Is occurring more frequently and lasting longer, however.  Bentyl does help, but sometimes symptoms last days or even up to a week when this occurs.  No other associated symptoms other than increased gas/belching.  This has been occurring for years, but like I said, more frequent and symptoms lasting longer.   Bowel movements are normal.  Last CT scan 09/2019 for the same symptoms.  EGD in 08/2019 with gastritis.  Colonoscopy 08/2020 with colon polyps and diverticulosis.  He does not think that his food or dietary triggered.  He said that he can typically eat anything without GI symptoms.  Past Medical History:  Diagnosis Date   Arthritis    BPH (benign prostatic hyperplasia)    Bunion    Callus    Carotid artery occlusion    Coronary artery disease    left main 60% stenosis. The LAD had a proximal 60-70% stenosis. There was mid 40% stenosis. Diagonal is moderate size with 40% stenosis. The circumflex system included a large OM with 60% ostial stenosis the RCA was occluded before the PDA. The EF was 65%.  2014   Glaucoma    beginning of glaucoma- uses drops    Hyperlipidemia    Hypertension    Leg fracture    right   Myocardial infarct (HCC) 1992   inferior posterior- discovered in 1992-? when happened    Obesity    Radiculopathy    Shortness of breath dyspnea    with exertion -    Past Surgical History:  Procedure Laterality Date   APPENDECTOMY  1958   CARDIAC CATHETERIZATION  05/2004   SHOWING 100% OCCLUDED DISTAL RIGHT CORONARY WITH  LEFT  TO RIGHT COLLATERALS, AS WELL AS ANTEGRADE FLOW, NORMAL LEFT MAIN, 50% NARROWING IN LEFT CIRCUMFLEX  WITH IRREGULARITIES IN THE LAD   CARDIAC  CATHETERIZATION N/A 02/12/2016   Procedure: Left Heart Cath and Coronary Angiography;  Surgeon: Marykay Lex, MD;  Location: [facility] INVASIVE CV LAB;  Service: Cardiovascular;  Laterality: N/A;   CARDIOVASCULAR STRESS TEST  02/26/2010   EF 65%, SMALL AREA OF MILD INFARCT AND POSSIBLE PER-INFARCT ISCHEMIA IN THE INFEROBASAL WALL   COLONOSCOPY     CORONARY ANGIOPLASTY     RIGHT CORONARY WERE UNSUCCESSFUL   CORONARY ARTERY BYPASS GRAFT N/A 02/25/2016   Procedure: CORONARY ARTERY BYPASS GRAFTING (CABG) times four using the left internal mammary artery and the right saphenus vein ;  Surgeon: Loreli Slot, MD;  Location: [facility] OR;  Service: Open Heart Surgery;  Laterality: N/A;   HERNIA REPAIR  1960   right hernia repair   INTRAOPERATIVE TRANSESOPHAGEAL ECHOCARDIOGRAM N/A 02/25/2016   Procedure: INTRAOPERATIVE TRANSESOPHAGEAL ECHOCARDIOGRAM;  Surgeon: Loreli Slot, MD;  Location: [facility] OR;  Service: Open Heart Surgery;  Laterality: N/A;   UMBILICAL HERNIA REPAIR     US ECHOCARDIOGRAPHY  01/03/2007   EF 55-60%    reports that he quit smoking about 41 years ago. His smoking use included cigarettes. He started smoking about 66 years ago. He has a 25 pack-year smoking history. He has never used smokeless tobacco. He reports current alcohol use of about 7.0 standard drinks of alcohol per week. He  reports that he does not use drugs. family history includes Congestive Heart Failure in his mother; Diabetes in his mother; Stroke in his father. No Known Allergies    Outpatient Encounter Medications as of 07/04/2023  Medication Sig   acetaminophen (TYLENOL) 325 MG tablet Take 2 tablets (650 mg total) by mouth every 6 (six) hours as needed for pain.   amLODipine (NORVASC) 5 MG tablet Take 1 tablet (5 mg total) by mouth daily.   aspirin 81 MG tablet Take 81 mg by mouth daily.   Coenzyme Q10 100 MG TABS Take 100 mg by mouth daily.    dicyclomine (BENTYL) 10 MG capsule Take 1 capsule (10 mg total) by mouth  every morning. May take an additional 3 times daily as needed.   ezetimibe (ZETIA) 10 MG tablet Take 1 tablet (10 mg total) by mouth daily.   fish oil-omega-3 fatty acids 1000 MG capsule Take 1,400 mg by mouth daily.   gabapentin (NEURONTIN) 300 MG capsule Take 1 capsule (300 mg total) by mouth in the morning AND 1 capsule (300 mg total) daily at 12 noon as needed AND 2 capsules (600 mg total) at bedtime. (Patient taking differently: Take 1 capsule (300 mg total)  Pt takes 1200 mg daily )   Glucosamine-Chondroitin-MSM 500-200-150 MG TABS Take by mouth.   hydrochlorothiazide (HYDRODIURIL) 25 MG tablet Take 1 tablet (25 mg total) by mouth daily.   latanoprost (XALATAN) 0.005 % ophthalmic solution Place 1 drop into both eyes nightly.   losartan (COZAAR) 100 MG tablet Take 1 tablet (100 mg total) by mouth daily.   meloxicam (MOBIC) 15 MG tablet Take 1 tablet (15 mg total) by mouth daily.   Plant Sterols and Stanols 450 MG TABS Take 900 mg by mouth daily.   pravastatin (PRAVACHOL) 80 MG tablet Take 1 tablet (80 mg total) by mouth daily.   traZODone (DESYREL) 50 MG tablet Take 0.5-1 tablets (25-50 mg total) by mouth at bedtime as needed for sleep.   No facility-administered encounter medications on file as of 07/04/2023.    REVIEW OF SYSTEMS  : All other systems reviewed and negative except where noted in the History of Present Illness.   PHYSICAL EXAM: BP 120/64 (BP Location: Right Arm, Patient Position: Sitting, Cuff Size: Normal)   Pulse 68   Ht 5\' 2"  (1.575 m)   Wt 178 lb 4 oz (80.9 kg)   BMI 32.60 kg/m  General: Well developed white male in no acute distress Head: Normocephalic and atraumatic Eyes:  Sclerae anicteric, conjunctiva pink. Ears: Normal auditory acuity Lungs: Clear throughout to auscultation; no W/R/R. Heart: Regular rate and rhythm; no M/R/G. Abdomen: Soft, non-distended.  BS present.  Non-tender.  Ventral hernia noted, diastasis recti.   Musculoskeletal: Symmetrical with  no gross deformities  Skin: No lesions on visible extremities Extremities: No edema  Neurological: Alert oriented x 4, grossly non-focal Psychological:  Alert and cooperative. Normal mood and affect  ASSESSMENT AND PLAN: *Left sided abdominal pain:  Left mid-abdominal pain that occurs intermittently.  Is occurring more frequently and lasting longer.  Bentyl does help, but sometimes symptoms last days or even up to a week when this occurs.  No other associated symptoms other than increased gas/belching.  This has been occurring for years, but like I said, more frequent and symptoms lasting longer.  Will repeat a CT scan of the abdomen and pelvis with contrast since the last was in February 2021.  He does not think that it is dietary triggered.  CC:  Copland, Gwenlyn Found, MD

## 2023-07-04 NOTE — Patient Instructions (Signed)
Your provider has requested that you go to the basement level for lab work before leaving today. Press "B" on the elevator. The lab is located at the first door on the left as you exit the elevator.  You have been scheduled for a CT scan of the abdomen and pelvis at [facility], 1st floor Radiology. You are scheduled on Tuesday 07/11/23 at 2:30 pm. Please arrive at 12:15 pm to your appointment time for registration and to drink oral contrast.   Please follow the written instructions below on the day of your exam:   1) Do not eat anything after 10:30 am (4 hours prior to your test)    You may take any medications as prescribed with a small amount of water, if necessary. If you take any of the following medications: METFORMIN, GLUCOPHAGE, GLUCOVANCE, AVANDAMET, RIOMET, FORTAMET, ACTOPLUS MET, JANUMET, GLUMETZA or METAGLIP, you MAY be asked to HOLD this medication 48 hours AFTER the exam.   The purpose of you drinking the oral contrast is to aid in the visualization of your intestinal tract. The contrast solution may cause some diarrhea. Depending on your individual set of symptoms, you may also receive an intravenous injection of x-ray contrast/dye. Plan on being at [facility] for 45 minutes or longer, depending on the type of exam you are having performed.   If you have any questions regarding your exam or if you need to reschedule, you may call [facility] at [phone] between the hours of 8:00 am and 5:00 pm, Monday-Friday.

## 2023-07-04 NOTE — Telephone Encounter (Signed)
Patient called and stated that he would like the lab order to be sent over to his primary care. Please advise

## 2023-07-04 NOTE — Progress Notes (Signed)
I agree with the assessment and plan as outlined by Ms. Cristi Loron. Could consider SIBO breath test in the future.

## 2023-07-05 NOTE — Telephone Encounter (Signed)
Order updated per Lab request. Patient informed.

## 2023-07-07 ENCOUNTER — Encounter: Payer: Self-pay | Admitting: Pharmacist Clinician (PhC)/ Clinical Pharmacy Specialist

## 2023-07-07 ENCOUNTER — Ambulatory Visit: Payer: PPO | Attending: Cardiovascular Disease | Admitting: Pharmacist Clinician (PhC)/ Clinical Pharmacy Specialist

## 2023-07-07 ENCOUNTER — Telehealth: Payer: Self-pay | Admitting: Pharmacist Clinician (PhC)/ Clinical Pharmacy Specialist

## 2023-07-07 DIAGNOSIS — E785 Hyperlipidemia, unspecified: Secondary | ICD-10-CM

## 2023-07-07 NOTE — Telephone Encounter (Signed)
Please do Repatha PA

## 2023-07-07 NOTE — Assessment & Plan Note (Signed)
Assessment: Patient with ASCVD not at LDL goal of < 55 Most recent LDL 66 on 05/31/23 Has been compliant with moderate intensity statin/ezetimibe : pravastatin 80, ezetimibe 10 Not able to tolerate high intensity statins secondary to myalgias - rosuvastatin, atorvastatin Reviewed options for lowering LDL cholesterol, including PCSK-9 inhibitors, bempedoic acid and inclisiran.  Discussed mechanisms of action, dosing, side effects, potential decreases in LDL cholesterol and costs.  Also reviewed potential options for patient assistance.  Plan: Patient agreeable to starting Repatha 140 mg q14d Repeat labs after:  3 months Lipid Liver function Patient was given information on [employer] - will sign patient up when PA approved Marital status - married Income < $102,000 (married)

## 2023-07-07 NOTE — Progress Notes (Unsigned)
Office Visit    Patient Name: Curtis Tyler Date of Encounter: 07/07/2023  Primary Care Provider:  [name], MD Primary Cardiologist:  Rollene Rotunda, MD  Chief Complaint    Hyperlipidemia   Significant Past Medical History   CAD CABG x 4  bradycardia AV block, asymptomatic, referred to EP  HTN Controlled on amlodipine, hctz, losartan  PAD Bilateral carotid stenosis (1-39%)  DM2 10/24 A1c 6.7     No Known Allergies  History of Present Illness    Curtis Tyler is a 82 y.o. male patient of Dr Antoine Poche, in the office today to discuss options for cholesterol management.  He is currently on pravastatin 80 mg which he tolerates.  He does note some myalgias, but states they were much worse with atorvastatin and rosuvastatin, and he is not bothered by current level of discomfort.     Pharmacy:  [facility] pharmacy mail order  (pt uses MyChart)  Insurance Carrier:   Manufacturing engineer, Plan 1  Repatha $47 in 2025  LDL Cholesterol goal:  LDL < 55  Current Medications:   pravastatin 80 mg every day (pm), ezetimibe 10 mg every day (pm) , plant sterols/stanols 2 every day (pm)  Previously tried: rosuvastatin, atorvastatin - myalgias   Family Hx:  father died at 42, no heart disease, mother lived to 52.;, sister and brother both with hypertension; children (2) healthy  Social Hx: Tobacco: no - quit 45 years ago Alcohol: wine most nights, occasional scotch   Diet:   low sodium, not much fried foods, does like cheese; doesn't eat out regularly  Exercise: broken leg last year,  had to cut back on 3-4 mile walks; now just stays busy all day moving around   Accessory Clinical Findings   Lab Results  Component Value Date   CHOL 189 05/31/2023   HDL 57.30 05/31/2023   LDLCALC 96 05/31/2023   LDLDIRECT 91.0 11/23/2022   TRIG 175.0 (H) 05/31/2023   CHOLHDL 3 05/31/2023    No results found for: "LIPOA"  Lab Results  Component Value Date   ALT 20 11/23/2022   AST 20  11/23/2022   ALKPHOS 69 11/23/2022   BILITOT 0.9 11/23/2022   Lab Results  Component Value Date   CREATININE 1.17 05/31/2023   BUN 23 05/31/2023   NA 137 05/31/2023   K 3.8 05/31/2023   CL 99 05/31/2023   CO2 27 05/31/2023   Lab Results  Component Value Date   HGBA1C 6.7 (H) 05/31/2023    Home Medications    Current Outpatient Medications  Medication Sig Dispense Refill   acetaminophen (TYLENOL) 325 MG tablet Take 2 tablets (650 mg total) by mouth every 6 (six) hours as needed for pain. 30 tablet 0   amLODipine (NORVASC) 5 MG tablet Take 1 tablet (5 mg total) by mouth daily. 90 tablet 1   aspirin 81 MG tablet Take 81 mg by mouth daily.     Coenzyme Q10 100 MG TABS Take 100 mg by mouth daily.      dicyclomine (BENTYL) 10 MG capsule Take 1 capsule (10 mg total) by mouth every morning. May take an additional 3 times daily as needed. 50 capsule 3   ezetimibe (ZETIA) 10 MG tablet Take 1 tablet (10 mg total) by mouth daily. 90 tablet 3   fish oil-omega-3 fatty acids 1000 MG capsule Take 1,400 mg by mouth daily.     gabapentin (NEURONTIN) 300 MG capsule Take 1 capsule (300 mg total) by mouth  in the morning AND 1 capsule (300 mg total) daily at 12 noon as needed AND 2 capsules (600 mg total) at bedtime. (Patient taking differently: Take 1 capsule (300 mg total)  Pt takes 1200 mg daily ) 360 capsule 1   Glucosamine-Chondroitin-MSM 500-200-150 MG TABS Take by mouth.     hydrochlorothiazide (HYDRODIURIL) 25 MG tablet Take 1 tablet (25 mg total) by mouth daily. 90 tablet 3   latanoprost (XALATAN) 0.005 % ophthalmic solution Place 1 drop into both eyes nightly. 7.5 mL 3   losartan (COZAAR) 100 MG tablet Take 1 tablet (100 mg total) by mouth daily. 90 tablet 1   meloxicam (MOBIC) 15 MG tablet Take 1 tablet (15 mg total) by mouth daily. 30 tablet 3   Plant Sterols and Stanols 450 MG TABS Take 900 mg by mouth daily.     pravastatin (PRAVACHOL) 80 MG tablet Take 1 tablet (80 mg total) by mouth  daily. 90 tablet 0   traZODone (DESYREL) 50 MG tablet Take 0.5-1 tablets (25-50 mg total) by mouth at bedtime as needed for sleep. 90 tablet 0   No current facility-administered medications for this visit.     Assessment & Plan    Hyperlipidemia with target LDL less than 70 Assessment: Patient with ASCVD not at LDL goal of < 55 Most recent LDL 66 on 05/31/23 Has been compliant with moderate intensity statin/ezetimibe : pravastatin 80, ezetimibe 10 Not able to tolerate high intensity statins secondary to myalgias - rosuvastatin, atorvastatin Reviewed options for lowering LDL cholesterol, including PCSK-9 inhibitors, bempedoic acid and inclisiran.  Discussed mechanisms of action, dosing, side effects, potential decreases in LDL cholesterol and costs.  Also reviewed potential options for patient assistance.  Plan: Patient agreeable to starting Repatha 140 mg q14d Repeat labs after:  3 months Lipid Liver function Patient was given information on [employer] - will sign patient up when PA approved Marital status - married Income < $102,000 (married)   Phillips Hay, PharmD [facility] [address]  [place], [place] [address] [phone]  07/07/2023, 4:29 PM

## 2023-07-07 NOTE — Patient Instructions (Signed)
Your Results:             Your most recent labs Goal  Total Cholesterol 189 < 200  Triglycerides 175 < 150  HDL (happy/good cholesterol) 57.3 > 40  LDL (lousy/bad cholesterol 96 < 55   Medication changes:  Repatha - injection every 14 days, will help reduce plaque in the arteries, lowers LDL cholesterol by 50-70%  Nexletol - oral daily tablet, lowers LDL by about 30-35%, increased risk of gout  Leqvio - similar to Repatha, but first 2 doses are 3 months apart, then every 6 months.  About 40-60% reduction in LDL  Lab orders:  We want to repeat labs after 2-3 months.  We will send you a lab order to remind you once we get closer to that time.    Patient Assistance:    We will sign you up for a [employer] Grant once your medication is approved by [employer].  I will call you with the ID number, then you will take this information to the pharmacy.  They will bill it after your insurance, bringing your copay to $0.  The grant will pay the first $2,500 in a one year period.    ID   BIN [id number]  PCN [id number]  GRP [id number]    Questions or concerns, please reach out to me Belenda Cruise) at [phone]  Thank you for choosing [facility]

## 2023-07-10 ENCOUNTER — Encounter: Payer: Self-pay | Admitting: Cardiology

## 2023-07-10 ENCOUNTER — Telehealth: Payer: Self-pay | Admitting: Pharmacy Technician

## 2023-07-10 ENCOUNTER — Other Ambulatory Visit (HOSPITAL_COMMUNITY): Payer: Self-pay

## 2023-07-10 ENCOUNTER — Other Ambulatory Visit (INDEPENDENT_AMBULATORY_CARE_PROVIDER_SITE_OTHER): Payer: PPO

## 2023-07-10 ENCOUNTER — Ambulatory Visit: Payer: PPO | Attending: Cardiology | Admitting: Cardiology

## 2023-07-10 VITALS — BP 110/66 | HR 74 | Ht 62.0 in | Wt 175.1 lb

## 2023-07-10 DIAGNOSIS — R1032 Left lower quadrant pain: Secondary | ICD-10-CM | POA: Diagnosis not present

## 2023-07-10 DIAGNOSIS — I10 Essential (primary) hypertension: Secondary | ICD-10-CM | POA: Diagnosis not present

## 2023-07-10 DIAGNOSIS — I2510 Atherosclerotic heart disease of native coronary artery without angina pectoris: Secondary | ICD-10-CM

## 2023-07-10 DIAGNOSIS — R1012 Left upper quadrant pain: Secondary | ICD-10-CM | POA: Diagnosis not present

## 2023-07-10 DIAGNOSIS — I441 Atrioventricular block, second degree: Secondary | ICD-10-CM

## 2023-07-10 NOTE — Progress Notes (Signed)
  Electrophysiology Office Note:   Date:  07/10/2023  ID:  Curtis Tyler, DOB 1941/04/06, MRN [id number]  Primary Cardiologist: Rollene Rotunda, MD Electrophysiologist: None      History of Present Illness:   Curtis Tyler is a 82 y.o. male with h/o coronary artery disease post CABG x 4, hypertension, hyperlipidemia, carotid stenosis, bradycardia seen today for  for Electrophysiology evaluation of bradycardia at the request of [facility].    He saw cardiology 05/25/2023.  He was noted to be in 2-1 AV block.  He wore a ZIO monitor that showed 2-1 AV block and an episode of complete heart block.  He currently feels well.  He is able to exert himself without issue.  He has no chest pain and no shortness of breath.  He does get more fatigued at the end of the day, but has no restrictions on activity.  Review of systems complete and found to be negative unless listed in HPI.   EP Information / Studies Reviewed:    EKG is not ordered today. EKG from 05/25/23 reviewed which showed sinus rhythm, Mobitz 1 AV block        Risk Assessment/Calculations:              Physical Exam:   VS:  BP 110/66   Pulse 74   Ht 5\' 2"  (1.575 m)   Wt 175 lb 1.3 oz (79.4 kg)   SpO2 95%   BMI 32.02 kg/m    Wt Readings from Last 3 Encounters:  07/10/23 175 lb 1.3 oz (79.4 kg)  07/04/23 178 lb 4 oz (80.9 kg)  06/16/23 179 lb 12.8 oz (81.6 kg)     GEN: Well nourished, well developed in no acute distress NECK: No JVD; No carotid bruits CARDIAC: Regular rate and rhythm, no murmurs, rubs, gallops RESPIRATORY:  Clear to auscultation without rales, wheezing or rhonchi  ABDOMEN: Soft, non-tender, non-distended EXTREMITIES:  No edema; No deformity   ASSESSMENT AND PLAN:    1.  Second-degree Mobitz 1 AV block: He is minimally symptomatic.  He can exert himself without issue.  He has no chest pain or shortness of breath.  He does get tired towards the end of the day, but has no acute complaints.  Dylann Layne hold off  on pacemaker implant for now.  2.  Coronary artery disease: Post CABG.  No current chest pain.  Plan per primary cardiology.  3.  Hypertension: Currently well-controlled  4.  Hyperlipidemia: Continue management per primary cardiology  Follow up with Dr. Elberta Fortis  as needed   Signed, Monseratt Ledin Jorja Loa, MD

## 2023-07-10 NOTE — Telephone Encounter (Signed)
Pharmacy Patient Advocate Encounter  Received notification from [facility] [employer] that Prior Authorization for repatha has been APPROVED from 07/10/23 to 01/06/24. Ran test claim, Copay is $47.00- one month. This test claim was processed through [facility]- copay amounts may vary at other pharmacies due to pharmacy/plan contracts, or as the patient moves through the different stages of their insurance plan.   PA #/Case ID/Reference #: [id number]

## 2023-07-10 NOTE — Telephone Encounter (Signed)
Pharmacy Patient Advocate Encounter   Received notification from Pt Calls Messages that prior authorization for repatha is required/requested.   Insurance verification completed.   The patient is insured through [facility] ADVANTAGE/RX ADVANCE .   Per test claim: PA required; PA submitted to above mentioned insurance via CoverMyMeds Key/confirmation #/EOC [id number] Status is pending

## 2023-07-11 ENCOUNTER — Encounter (INDEPENDENT_AMBULATORY_CARE_PROVIDER_SITE_OTHER): Payer: PPO | Admitting: Family Medicine

## 2023-07-11 ENCOUNTER — Other Ambulatory Visit (HOSPITAL_COMMUNITY): Payer: PPO

## 2023-07-11 DIAGNOSIS — R799 Abnormal finding of blood chemistry, unspecified: Secondary | ICD-10-CM

## 2023-07-11 LAB — BASIC METABOLIC PANEL
BUN: 27 mg/dL — ABNORMAL HIGH (ref 6–23)
CO2: 32 meq/L (ref 19–32)
Calcium: 9.2 mg/dL (ref 8.4–10.5)
Chloride: 95 meq/L — ABNORMAL LOW (ref 96–112)
Creatinine, Ser: 1.11 mg/dL (ref 0.40–1.50)
GFR: 60.87 mL/min (ref >60.00)
Glucose, Bld: 98 mg/dL (ref 70–99)
Potassium: 3.7 meq/L (ref 3.5–5.1)
Sodium: 137 meq/L (ref 135–145)

## 2023-07-12 ENCOUNTER — Other Ambulatory Visit: Payer: Self-pay

## 2023-07-12 ENCOUNTER — Ambulatory Visit (HOSPITAL_COMMUNITY)
Admission: RE | Admit: 2023-07-12 | Discharge: 2023-07-12 | Disposition: A | Discharge status: Home / Self Care | Payer: PPO | Source: Ambulatory Visit | Attending: Gastroenterology | Admitting: Gastroenterology

## 2023-07-12 ENCOUNTER — Encounter: Payer: Self-pay | Admitting: Pharmacist Clinician (PhC)/ Clinical Pharmacy Specialist

## 2023-07-12 DIAGNOSIS — R1032 Left lower quadrant pain: Secondary | ICD-10-CM | POA: Insufficient documentation

## 2023-07-12 DIAGNOSIS — K8020 Calculus of gallbladder without cholecystitis without obstruction: Secondary | ICD-10-CM | POA: Diagnosis not present

## 2023-07-12 DIAGNOSIS — N400 Enlarged prostate without lower urinary tract symptoms: Secondary | ICD-10-CM | POA: Diagnosis not present

## 2023-07-12 DIAGNOSIS — R109 Unspecified abdominal pain: Secondary | ICD-10-CM | POA: Diagnosis not present

## 2023-07-12 DIAGNOSIS — R799 Abnormal finding of blood chemistry, unspecified: Secondary | ICD-10-CM

## 2023-07-12 MED ORDER — IOHEXOL 300 MG/ML  SOLN
30.0000 mL | Freq: Once | INTRAMUSCULAR | Status: AC | PRN
  Administered 2023-07-12: 30 mL via ORAL

## 2023-07-12 MED ORDER — REPATHA SURECLICK 140 MG/ML ~~LOC~~ SOAJ
140.0000 mg | SUBCUTANEOUS | 3 refills | Status: DC
Start: 2023-07-12 — End: 2024-06-06
  Filled 2023-07-12: qty 6, 84d supply, fill #0
  Filled 2023-09-26: qty 6, 84d supply, fill #1
  Filled 2023-12-17: qty 6, 84d supply, fill #2
  Filled 2024-03-18: qty 6, 84d supply, fill #3

## 2023-07-12 MED ORDER — IOHEXOL 300 MG/ML  SOLN
100.0000 mL | Freq: Once | INTRAMUSCULAR | Status: AC | PRN
  Administered 2023-07-12: 100 mL via INTRAVENOUS

## 2023-07-12 NOTE — Telephone Encounter (Signed)

## 2023-07-12 NOTE — Telephone Encounter (Signed)
Repatha ordered

## 2023-07-12 NOTE — Addendum Note (Signed)
Addended by: Rosalee Kaufman on: 07/12/2023 01:26 PM   Modules accepted: Orders

## 2023-07-13 ENCOUNTER — Other Ambulatory Visit (HOSPITAL_COMMUNITY): Payer: Self-pay

## 2023-07-13 ENCOUNTER — Other Ambulatory Visit: Payer: Self-pay

## 2023-07-13 DIAGNOSIS — M25552 Pain in left hip: Secondary | ICD-10-CM | POA: Diagnosis not present

## 2023-07-13 DIAGNOSIS — M545 Low back pain, unspecified: Secondary | ICD-10-CM | POA: Diagnosis not present

## 2023-07-13 DIAGNOSIS — M25551 Pain in right hip: Secondary | ICD-10-CM | POA: Diagnosis not present

## 2023-07-13 MED ORDER — METHOCARBAMOL 500 MG PO TABS
500.0000 mg | ORAL_TABLET | Freq: Two times a day (BID) | ORAL | 0 refills | Status: DC | PRN
  Filled 2023-07-13: qty 30, 15d supply, fill #0

## 2023-07-17 ENCOUNTER — Telehealth: Payer: Self-pay | Admitting: Family Medicine

## 2023-07-17 NOTE — Telephone Encounter (Signed)
Please call pt on mobile number

## 2023-07-18 NOTE — Telephone Encounter (Signed)
Call was actually in regards to his wife. Telephone note opened for her.

## 2023-07-18 NOTE — Telephone Encounter (Signed)
Patient called stated he will be speak with with his urologist and requested we send results to the urologist as well.

## 2023-07-18 NOTE — Telephone Encounter (Signed)
The pt has been advised that the report has been sent to Dr Laverle Patter at [facility].

## 2023-07-24 DIAGNOSIS — H43812 Vitreous degeneration, left eye: Secondary | ICD-10-CM | POA: Diagnosis not present

## 2023-07-24 DIAGNOSIS — H43392 Other vitreous opacities, left eye: Secondary | ICD-10-CM | POA: Diagnosis not present

## 2023-07-26 ENCOUNTER — Encounter: Payer: Self-pay | Admitting: Family Medicine

## 2023-07-26 DIAGNOSIS — N400 Enlarged prostate without lower urinary tract symptoms: Secondary | ICD-10-CM

## 2023-07-31 ENCOUNTER — Other Ambulatory Visit: Payer: Self-pay | Admitting: Family Medicine

## 2023-07-31 ENCOUNTER — Other Ambulatory Visit: Payer: Self-pay

## 2023-07-31 ENCOUNTER — Other Ambulatory Visit (INDEPENDENT_AMBULATORY_CARE_PROVIDER_SITE_OTHER): Payer: PPO

## 2023-07-31 DIAGNOSIS — N400 Enlarged prostate without lower urinary tract symptoms: Secondary | ICD-10-CM

## 2023-07-31 MED ORDER — AMLODIPINE BESYLATE 5 MG PO TABS
5.0000 mg | ORAL_TABLET | Freq: Every day | ORAL | 1 refills | Status: DC
  Filled 2023-07-31: qty 90, 90d supply, fill #0
  Filled 2023-10-23: qty 90, 90d supply, fill #1

## 2023-08-01 ENCOUNTER — Institutional Professional Consult (permissible substitution): Payer: PPO | Admitting: Cardiology

## 2023-08-01 DIAGNOSIS — N401 Enlarged prostate with lower urinary tract symptoms: Secondary | ICD-10-CM | POA: Diagnosis not present

## 2023-08-01 DIAGNOSIS — R3915 Urgency of urination: Secondary | ICD-10-CM | POA: Diagnosis not present

## 2023-08-01 LAB — PSA: PSA: 3.68 ng/mL (ref 0.10–4.00)

## 2023-08-02 ENCOUNTER — Encounter: Payer: Self-pay | Admitting: Family Medicine

## 2023-08-14 ENCOUNTER — Other Ambulatory Visit (HOSPITAL_COMMUNITY): Payer: Self-pay

## 2023-08-14 ENCOUNTER — Other Ambulatory Visit: Payer: Self-pay | Admitting: Family Medicine

## 2023-08-14 ENCOUNTER — Other Ambulatory Visit: Payer: Self-pay

## 2023-08-14 DIAGNOSIS — E785 Hyperlipidemia, unspecified: Secondary | ICD-10-CM

## 2023-08-14 MED ORDER — PRAVASTATIN SODIUM 80 MG PO TABS
80.0000 mg | ORAL_TABLET | Freq: Every day | ORAL | 1 refills | Status: DC
  Filled 2023-08-14: qty 90, 90d supply, fill #0
  Filled 2023-11-26: qty 90, 90d supply, fill #1

## 2023-08-17 ENCOUNTER — Other Ambulatory Visit (HOSPITAL_BASED_OUTPATIENT_CLINIC_OR_DEPARTMENT_OTHER): Payer: Self-pay

## 2023-08-17 ENCOUNTER — Other Ambulatory Visit (HOSPITAL_COMMUNITY): Payer: Self-pay

## 2023-08-17 MED ORDER — MIRABEGRON ER 50 MG PO TB24
50.0000 mg | ORAL_TABLET | Freq: Every day | ORAL | 3 refills | Status: DC
  Filled 2023-08-17: qty 90, 90d supply, fill #0
  Filled 2023-11-12: qty 90, 90d supply, fill #1
  Filled 2024-02-14: qty 90, 90d supply, fill #0
  Filled 2024-05-10 (×2): qty 90, 90d supply, fill #1

## 2023-08-21 ENCOUNTER — Other Ambulatory Visit: Payer: Self-pay | Admitting: Family Medicine

## 2023-08-21 ENCOUNTER — Other Ambulatory Visit (HOSPITAL_COMMUNITY): Payer: Self-pay

## 2023-08-21 ENCOUNTER — Other Ambulatory Visit (HOSPITAL_BASED_OUTPATIENT_CLINIC_OR_DEPARTMENT_OTHER): Payer: Self-pay

## 2023-08-21 DIAGNOSIS — I10 Essential (primary) hypertension: Secondary | ICD-10-CM

## 2023-08-22 ENCOUNTER — Other Ambulatory Visit: Payer: Self-pay

## 2023-08-22 MED ORDER — LOSARTAN POTASSIUM 100 MG PO TABS
100.0000 mg | ORAL_TABLET | Freq: Every day | ORAL | 1 refills | Status: DC
  Filled 2023-08-22: qty 90, 90d supply, fill #0
  Filled 2023-11-26: qty 90, 90d supply, fill #1

## 2023-09-07 ENCOUNTER — Other Ambulatory Visit (HOSPITAL_COMMUNITY): Payer: Self-pay

## 2023-09-11 DIAGNOSIS — D225 Melanocytic nevi of trunk: Secondary | ICD-10-CM | POA: Diagnosis not present

## 2023-09-11 DIAGNOSIS — L821 Other seborrheic keratosis: Secondary | ICD-10-CM | POA: Diagnosis not present

## 2023-09-11 DIAGNOSIS — L814 Other melanin hyperpigmentation: Secondary | ICD-10-CM | POA: Diagnosis not present

## 2023-09-14 ENCOUNTER — Other Ambulatory Visit: Payer: Self-pay | Admitting: Family Medicine

## 2023-09-15 ENCOUNTER — Other Ambulatory Visit (HOSPITAL_COMMUNITY): Payer: Self-pay

## 2023-09-15 ENCOUNTER — Other Ambulatory Visit: Payer: Self-pay

## 2023-09-15 MED ORDER — TRAZODONE HCL 50 MG PO TABS
25.0000 mg | ORAL_TABLET | Freq: Every evening | ORAL | 0 refills | Status: DC | PRN
  Filled 2023-09-15: qty 90, 90d supply, fill #0

## 2023-09-24 ENCOUNTER — Encounter: Payer: Self-pay | Admitting: Pharmacist Clinician (PhC)/ Clinical Pharmacy Specialist

## 2023-09-24 DIAGNOSIS — E785 Hyperlipidemia, unspecified: Secondary | ICD-10-CM

## 2023-09-26 ENCOUNTER — Other Ambulatory Visit (HOSPITAL_COMMUNITY): Payer: Self-pay

## 2023-09-27 DIAGNOSIS — E785 Hyperlipidemia, unspecified: Secondary | ICD-10-CM | POA: Diagnosis not present

## 2023-09-28 LAB — LIPID PANEL
Chol/HDL Ratio: 1.6 {ratio} (ref 0.0–5.0)
Cholesterol, Total: 116 mg/dL (ref 100–199)
HDL: 72 mg/dL (ref >39)
LDL Chol Calc (NIH): 26 mg/dL (ref 0–99)
Triglycerides: 94 mg/dL (ref 0–149)
VLDL Cholesterol Cal: 18 mg/dL (ref 5–40)

## 2023-09-28 LAB — HEPATIC FUNCTION PANEL
ALT: 21 [IU]/L (ref 0–44)
AST: 21 [IU]/L (ref 0–40)
Albumin: 4.7 g/dL (ref 3.7–4.7)
Alkaline Phosphatase: 86 [IU]/L (ref 44–121)
Bilirubin Total: 0.8 mg/dL (ref 0.0–1.2)
Bilirubin, Direct: 0.33 mg/dL (ref 0.00–0.40)
Total Protein: 7.4 g/dL (ref 6.0–8.5)

## 2023-09-30 ENCOUNTER — Other Ambulatory Visit: Payer: Self-pay | Admitting: Family Medicine

## 2023-09-30 DIAGNOSIS — M79605 Pain in left leg: Secondary | ICD-10-CM

## 2023-10-02 ENCOUNTER — Other Ambulatory Visit (HOSPITAL_COMMUNITY): Payer: Self-pay

## 2023-10-02 ENCOUNTER — Encounter: Payer: Self-pay | Admitting: Pharmacist Clinician (PhC)/ Clinical Pharmacy Specialist

## 2023-10-02 MED ORDER — GABAPENTIN 300 MG PO CAPS
ORAL_CAPSULE | ORAL | 0 refills | Status: DC
  Filled 2023-10-02: qty 360, 90d supply, fill #0

## 2023-10-03 ENCOUNTER — Other Ambulatory Visit (HOSPITAL_COMMUNITY): Payer: Self-pay

## 2023-10-23 ENCOUNTER — Other Ambulatory Visit: Payer: Self-pay | Admitting: Family Medicine

## 2023-10-23 ENCOUNTER — Other Ambulatory Visit (HOSPITAL_COMMUNITY): Payer: Self-pay

## 2023-10-23 ENCOUNTER — Other Ambulatory Visit: Payer: Self-pay

## 2023-10-23 MED ORDER — HYDROCHLOROTHIAZIDE 25 MG PO TABS
25.0000 mg | ORAL_TABLET | Freq: Every day | ORAL | 3 refills | Status: DC
  Filled 2023-10-23: qty 90, 90d supply, fill #0
  Filled 2024-02-24: qty 90, 90d supply, fill #1

## 2023-10-25 ENCOUNTER — Encounter: Payer: Self-pay | Admitting: Family Medicine

## 2023-10-25 NOTE — Telephone Encounter (Signed)
 Pt stated that the issue has been resolved.

## 2023-11-12 ENCOUNTER — Other Ambulatory Visit (HOSPITAL_COMMUNITY): Payer: Self-pay

## 2023-11-13 ENCOUNTER — Other Ambulatory Visit (HOSPITAL_COMMUNITY): Payer: Self-pay

## 2023-11-22 ENCOUNTER — Ambulatory Visit: Payer: PPO | Admitting: Neurology

## 2023-11-22 NOTE — Telephone Encounter (Signed)
error 

## 2023-11-26 ENCOUNTER — Other Ambulatory Visit: Payer: Self-pay | Admitting: Gastroenterology

## 2023-11-27 ENCOUNTER — Other Ambulatory Visit (HOSPITAL_COMMUNITY): Payer: Self-pay

## 2023-11-27 ENCOUNTER — Other Ambulatory Visit: Payer: Self-pay

## 2023-11-27 MED ORDER — DICYCLOMINE HCL 10 MG PO CAPS
10.0000 mg | ORAL_CAPSULE | Freq: Every morning | ORAL | 3 refills | Status: AC
  Filled 2023-11-27: qty 50, 13d supply, fill #0
  Filled 2023-12-17: qty 50, 13d supply, fill #1

## 2023-11-28 ENCOUNTER — Other Ambulatory Visit (HOSPITAL_COMMUNITY): Payer: Self-pay

## 2023-11-28 ENCOUNTER — Other Ambulatory Visit: Payer: Self-pay

## 2023-11-28 DIAGNOSIS — M51369 Other intervertebral disc degeneration, lumbar region without mention of lumbar back pain or lower extremity pain: Secondary | ICD-10-CM | POA: Diagnosis not present

## 2023-11-28 DIAGNOSIS — M5451 Vertebrogenic low back pain: Secondary | ICD-10-CM | POA: Diagnosis not present

## 2023-11-28 MED ORDER — PREDNISONE 5 MG (21) PO TBPK
ORAL_TABLET | ORAL | 1 refills | Status: DC
  Filled 2023-11-28: qty 21, 6d supply, fill #0
  Filled 2023-12-26: qty 21, 6d supply, fill #1

## 2023-11-29 ENCOUNTER — Other Ambulatory Visit: Payer: Self-pay

## 2023-12-04 ENCOUNTER — Telehealth: Payer: Self-pay

## 2023-12-04 ENCOUNTER — Encounter: Payer: Self-pay | Admitting: Family Medicine

## 2023-12-04 DIAGNOSIS — E1122 Type 2 diabetes mellitus with diabetic chronic kidney disease: Secondary | ICD-10-CM

## 2023-12-04 DIAGNOSIS — E785 Hyperlipidemia, unspecified: Secondary | ICD-10-CM

## 2023-12-04 DIAGNOSIS — I10 Essential (primary) hypertension: Secondary | ICD-10-CM

## 2023-12-04 NOTE — Telephone Encounter (Signed)
 Please advise? Pt has an appt scheduled with you on 12/14/23.

## 2023-12-04 NOTE — Telephone Encounter (Signed)
I sent him a mychart.

## 2023-12-04 NOTE — Telephone Encounter (Signed)
 Copied from CRM [id number]. Topic: Clinical - Request for Lab/Test Order >> Dec 04, 2023  8:44 AM Saverio Danker wrote: Reason for CRM: Patient is calling because he would like a lab put in for a full blood workup

## 2023-12-05 ENCOUNTER — Encounter: Payer: Self-pay | Admitting: Family Medicine

## 2023-12-05 ENCOUNTER — Other Ambulatory Visit (INDEPENDENT_AMBULATORY_CARE_PROVIDER_SITE_OTHER)

## 2023-12-05 DIAGNOSIS — E785 Hyperlipidemia, unspecified: Secondary | ICD-10-CM

## 2023-12-05 DIAGNOSIS — I10 Essential (primary) hypertension: Secondary | ICD-10-CM

## 2023-12-05 DIAGNOSIS — E1122 Type 2 diabetes mellitus with diabetic chronic kidney disease: Secondary | ICD-10-CM | POA: Diagnosis not present

## 2023-12-05 LAB — HEMOGLOBIN A1C: Hgb A1c MFr Bld: 6.9 % — ABNORMAL HIGH (ref 4.6–6.5)

## 2023-12-05 LAB — CBC
HCT: 49.3 % (ref 39.0–52.0)
Hemoglobin: 16.8 g/dL (ref 13.0–17.0)
MCHC: 34.1 g/dL (ref 30.0–36.0)
MCV: 95.1 fl (ref 78.0–100.0)
Platelets: 182 10*3/uL (ref 150.0–400.0)
RBC: 5.19 Mil/uL (ref 4.22–5.81)
RDW: 12.8 % (ref 11.5–15.5)
WBC: 6.4 10*3/uL (ref 4.0–10.5)

## 2023-12-05 LAB — LIPID PANEL
Cholesterol: 109 mg/dL (ref 0–200)
HDL: 65.5 mg/dL (ref >39.00)
LDL Cholesterol: 17 mg/dL (ref 0–99)
NonHDL: 43.61
Total CHOL/HDL Ratio: 2
Triglycerides: 132 mg/dL (ref 0.0–149.0)
VLDL: 26.4 mg/dL (ref 0.0–40.0)

## 2023-12-10 ENCOUNTER — Other Ambulatory Visit: Payer: Self-pay | Admitting: Family Medicine

## 2023-12-11 ENCOUNTER — Other Ambulatory Visit (HOSPITAL_COMMUNITY): Payer: Self-pay

## 2023-12-11 ENCOUNTER — Other Ambulatory Visit: Payer: Self-pay

## 2023-12-11 MED ORDER — TRAZODONE HCL 50 MG PO TABS
25.0000 mg | ORAL_TABLET | Freq: Every evening | ORAL | 0 refills | Status: DC | PRN
  Filled 2023-12-11: qty 90, 90d supply, fill #0

## 2023-12-13 NOTE — Progress Notes (Unsigned)
 [facility] [address], [address] [place], [place] [address] [phone]  Date:  12/14/2023   Name:  Curtis Tyler   DOB:  02-22-1941   MRN:  [id number]  PCP:  Kaylee Partridge, MD    Chief Complaint: No chief complaint on file.   History of Present Illness:  Curtis Tyler is a 83 y.o. very pleasant male patient who presents with the following:  Patient seen today for follow-up.  Most recent visit with myself was in October history of mild diabetes, coronary artery disease status post CABG, hyperlipidemia, hypertension, carotid stenosis, BPH  He has been concerned about memory. Dr Tresia Fruit did arrange for neuropsychiatric testing but was not particularly concerned at their visit  He also has cardiology care with Dr. Lavonne Prairie   He requested to do lab work in advance of this visit, labs from earlier this month as below  Results for orders placed or performed in visit on 12/05/23  Lipid panel    Collection Time: 12/05/23  8:43 AM  Result Value Ref Range    Cholesterol 109 0 - 200 mg/dL    Triglycerides 696.2 0.0 - 149.0 mg/dL    HDL 95.28 >41.32 mg/dL    VLDL 44.0 0.0 - 10.2 mg/dL    LDL Cholesterol 17 0 - 99 mg/dL    Total CHOL/HDL Ratio 2      NonHDL 43.61    Hemoglobin A1c    Collection Time: 12/05/23  8:43 AM  Result Value Ref Range    Hgb A1c MFr Bld 6.9 (H) 4.6 - 6.5 %  CBC    Collection Time: 12/05/23  8:43 AM  Result Value Ref Range    WBC 6.4 4.0 - 10.5 K/uL    RBC 5.19 4.22 - 5.81 Mil/uL    Platelets 182.0 150.0 - 400.0 K/uL    Hemoglobin 16.8 13.0 - 17.0 g/dL    HCT 72.5 36.6 - 44.0 %    MCV 95.1 78.0 - 100.0 fl    MCHC 34.1 30.0 - 36.0 g/dL    RDW 34.7 42.5 - 95.6 %     He sees Dr. Rozanne Corners with urology for PSA management  Lab Results  Component Value Date   PSA 3.68 07/31/2023   PSA 4.11 (H) 08/12/2022   PSA 3.46 12/29/2021   Amlodipine 5 Aspirin 81 Repatha Zetia Omega 3 Pravastatin  80 Gabapentin HCTZ 25 Losartan 100 Trazodone  RSV, Shingrix complete  Patient Active Problem List   Diagnosis Date Noted   Left lower quadrant abdominal pain 07/04/2023   LUQ pain 09/15/2021   Educated about COVID-19 virus infection 05/11/2020   Lung nodule 04/10/2020   Elevated bilirubin 05/21/2019   Diabetes mellitus, type 2 (HCC) 05/20/2019   Dyslipidemia 05/03/2019   Bradycardia 09/12/2018   Plantar porokeratosis, acquired 05/16/2018   Abnormal stress ECG with treadmill 02/12/2016   Colonic polyp 09/21/2015   Left lower quadrant pain 09/03/2015   Cellulitis of right lower extremity 05/14/2015   Need for immunization against influenza 05/14/2015   Bilateral low back pain with sciatica 09/03/2014   Hip pain, bilateral 09/03/2014   Bilateral carotid artery stenosis 07/21/2014   Myalgia 07/21/2014   Acute pain of left knee 07/15/2014   Benign prostatic hyperplasia with lower urinary tract symptoms 07/15/2014   Effusion of left knee 07/15/2014   History of elevated prostate specific antigen (PSA) 07/15/2014   Hypertrophy of nasal turbinates 07/15/2014   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 The LAD had a proximal 60-70% stenosis. There was mid 40% stenosis. Diagonal is moderate size with 40% stenosis. The circumflex system included a large OM with 60% ostial stenosis the RCA was occluded  before the PDA. The EF was 65%.  2014   Glaucoma    beginning of glaucoma- uses drops    Hyperlipidemia    Hypertension    Leg fracture    right   Myocardial infarct (HCC) 1992   inferior posterior- discovered in 1992-? when happened    Obesity    Radiculopathy    Shortness of breath dyspnea    with exertion -     Past Surgical History:  Procedure Laterality Date   APPENDECTOMY  1958   CARDIAC CATHETERIZATION  05/2004   SHOWING 100% OCCLUDED DISTAL RIGHT CORONARY WITH  LEFT  TO RIGHT COLLATERALS, AS WELL AS ANTEGRADE FLOW, NORMAL LEFT MAIN, 50% NARROWING IN LEFT CIRCUMFLEX  WITH IRREGULARITIES IN THE LAD   CARDIAC CATHETERIZATION N/A 02/12/2016   Procedure: Left Heart Cath and Coronary Angiography;  Surgeon: Marykay Lex, MD;  Location: [facility] INVASIVE CV LAB;  Service: Cardiovascular;  Laterality: N/A;   CARDIOVASCULAR STRESS TEST  02/26/2010   EF 65%, SMALL AREA OF MILD INFARCT AND POSSIBLE PER-INFARCT ISCHEMIA IN THE INFEROBASAL WALL   COLONOSCOPY     CORONARY ANGIOPLASTY     RIGHT CORONARY WERE UNSUCCESSFUL   CORONARY ARTERY BYPASS GRAFT N/A 02/25/2016   Procedure: CORONARY ARTERY BYPASS GRAFTING (CABG) times four using the left internal mammary artery and the right saphenus vein ;  Surgeon: Loreli Slot, MD;  Location[facility];  Service: Open Heart Surgery;  Laterality: N/A;   HERNIA REPAIR  1960   right hernia repair   INTRAOPERATIVE TRANSESOPHAGEAL ECHOCARDIOGRAM N/A 02/25/2016   Procedure: INTRAOPERATIVE TRANSESOPHAGEAL ECHOCARDIOGRAM;  Surgeon: Loreli Slot, MD;  Location: [facility] OR;  Service: Open Heart Surgery;  Laterality: N/A;   UMBILICAL HERNIA REPAIR     US ECHOCARDIOGRAPHY  01/03/2007   EF 55-60%    Social History   Tobacco Use   Smoking status: Former    Current packs/day: 0.00    Average packs/day: 1 pack/day for 25.0 years (25.0 ttl pk-yrs)    Types: Cigarettes    Start date: 07/23/1956    Quit date: 07/23/1981    Years since quitting: 42.4    Smokeless tobacco: Never  Vaping Use   Vaping status: Never Used  Substance Use Topics   Alcohol use: Yes    Alcohol/week: 7.0 standard drinks of alcohol    Types: 7 Glasses of wine per week    Comment: glass of wine a night    Drug use: No    Family History  Problem Relation Age of Onset   Congestive Heart Failure Mother    Diabetes Mother    Stroke Father    Hypertension Neg Hx  Colon cancer Neg Hx    Colon polyps Neg Hx    Esophageal cancer Neg Hx    Rectal cancer Neg Hx    Stomach cancer Neg Hx    Neuropathy Neg Hx    Alzheimer's disease Neg Hx    Dementia Neg Hx     No Known Allergies  Medication list has been reviewed and updated.  Current Outpatient Medications on File Prior to Visit  Medication Sig Dispense Refill   acetaminophen (TYLENOL) 325 MG tablet Take 2 tablets (650 mg total) by mouth every 6 (six) hours as needed for pain. 30 tablet 0   amLODipine (NORVASC) 5 MG tablet Take 1 tablet (5 mg total) by mouth daily. 90 tablet 1   aspirin 81 MG tablet Take 81 mg by mouth daily.     Coenzyme Q10 100 MG TABS Take 100 mg by mouth daily.      dicyclomine (BENTYL) 10 MG capsule Take 1 capsule (10 mg total) by mouth every morning. May take an additional 3 times daily as needed. 50 capsule 3   Evolocumab (REPATHA SURECLICK) 140 MG/ML SOAJ Inject 140 mg into the skin every 14 (fourteen) days. 6 mL 3   ezetimibe (ZETIA) 10 MG tablet Take 1 tablet (10 mg total) by mouth daily. 90 tablet 3   fish oil-omega-3 fatty acids 1000 MG capsule Take 1,400 mg by mouth daily.     gabapentin (NEURONTIN) 300 MG capsule Take 1 capsule (300 mg total) by mouth in the morning AND 1 capsule (300 mg total) daily at 12 noon as needed AND 2 capsules (600 mg total) at bedtime. 360 capsule 0   Glucosamine-Chondroitin-MSM 500-200-150 MG TABS Take by mouth.     hydrochlorothiazide (HYDRODIURIL) 25 MG tablet Take 1 tablet (25 mg total) by mouth daily. 90 tablet 3   latanoprost (XALATAN) 0.005 %  ophthalmic solution Place 1 drop into both eyes nightly. 7.5 mL 3   losartan (COZAAR) 100 MG tablet Take 1 tablet (100 mg total) by mouth daily. 90 tablet 1   meloxicam (MOBIC) 15 MG tablet Take 1 tablet (15 mg total) by mouth daily. 30 tablet 3   methocarbamol (ROBAXIN) 500 MG tablet Take 1 tablet (500 mg total) by mouth 2 (two) times daily as needed for low back pain 30 tablet 0   mirabegron ER (MYRBETRIQ) 50 MG TB24 tablet Take 1 tablet (50 mg total) by mouth daily. 90 tablet 3   Plant Sterols and Stanols 450 MG TABS Take 900 mg by mouth daily.     pravastatin (PRAVACHOL) 80 MG tablet Take 1 tablet (80 mg total) by mouth daily. 90 tablet 1   predniSONE (STERAPRED UNI-PAK 21 TAB) 5 MG (21) TBPK tablet Take by mouth as directed for 6 days with food. 21 tablet 1   traZODone (DESYREL) 50 MG tablet Take 0.5-1 tablets (25-50 mg total) by mouth at bedtime as needed for sleep. 90 tablet 0   No current facility-administered medications on file prior to visit.    Review of Systems:  As per HPI- otherwise negative.   Physical Examination: There were no vitals filed for this visit. There were no vitals filed for this visit. There is no height or weight on file to calculate BMI. Ideal Body Weight:    GEN: no acute distress. HEENT: Atraumatic, Normocephalic.  Ears and Nose: No external deformity. CV: RRR, No M/G/R. No JVD. No thrill. No extra heart sounds. PULM: CTA B, no wheezes, crackles, rhonchi. No retractions. No resp.  distress. No accessory muscle use. ABD: S, NT, ND, +BS. No rebound. No HSM. EXTR: No c/c/e PSYCH: Normally interactive. Conversant.    Assessment and Plan: ***  Signed Gates Kasal, MD

## 2023-12-13 NOTE — Patient Instructions (Incomplete)
 It was good to see you again today  Assuming all is well please see me in about 6 months  You may be due for an eye exam?    Also, recommend a COVID booster if none the last 6 months or so/if you have not yet done 2 doses during the 2020/2025 season

## 2023-12-14 ENCOUNTER — Ambulatory Visit (INDEPENDENT_AMBULATORY_CARE_PROVIDER_SITE_OTHER): Admitting: Family Medicine

## 2023-12-14 VITALS — BP 122/68 | HR 78 | Temp 98.6°F | Resp 18 | Ht 62.0 in | Wt 174.4 lb

## 2023-12-14 DIAGNOSIS — I2510 Atherosclerotic heart disease of native coronary artery without angina pectoris: Secondary | ICD-10-CM | POA: Diagnosis not present

## 2023-12-14 DIAGNOSIS — E1122 Type 2 diabetes mellitus with diabetic chronic kidney disease: Secondary | ICD-10-CM | POA: Diagnosis not present

## 2023-12-14 DIAGNOSIS — N400 Enlarged prostate without lower urinary tract symptoms: Secondary | ICD-10-CM

## 2023-12-14 DIAGNOSIS — I10 Essential (primary) hypertension: Secondary | ICD-10-CM | POA: Diagnosis not present

## 2023-12-14 DIAGNOSIS — E785 Hyperlipidemia, unspecified: Secondary | ICD-10-CM | POA: Diagnosis not present

## 2023-12-18 ENCOUNTER — Other Ambulatory Visit (HOSPITAL_COMMUNITY): Payer: Self-pay

## 2023-12-18 ENCOUNTER — Other Ambulatory Visit: Payer: Self-pay

## 2023-12-18 NOTE — Progress Notes (Signed)
 Cardiology Office Note:   Date:  12/19/2023  ID:  Curtis Tyler, DOB [birth year], MRN [id number] PCP: Kaylee Partridge, MD  [facility] Providers Cardiologist:  Eilleen Grates, MD {  History of Present Illness:   Curtis Tyler is a 83 y.o. male who presents for follow up of CAD.  He had a cardiac cath in 2014 demonstrating left main 60% stenosis. The LAD had a proximal 60-70% stenosis. There was mid 40% stenosis. Diagonal is moderate size with 40% stenosis. The circumflex system included a large OM with 60% ostial stenosis the RCA was occluded before the PDA. The EF was 65%. However, we decided to manage him medically unless he had worsening symptoms. In May 2017 I sent him for a screening POET (Plain Old Exercise Treadmill).  This was abnormal with ST depression and was a high risk study. He then underwent cardiac catheterization where he was found to have a 60% ostial left main, a 90% proximal LAD, 90% proximal circumflex, and a diffusely diseased right coronary with a chronically totally occluded posterior descending.  He was taken the operating room on 02/25/2016 for CABG x 4 by Dr. Luna Salinas.   He had bradycardia and I stopped his beta blocker.  A Holter demonstrated sinus with Mobitz Type 1.  He did have brief runs of 2:1 block without symptoms.  There were no sustained pauses.     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  He had some complete heart block.  However, he had no symptoms.  He saw Dr. Lawana Pray and he did not require pacing.  He returns for follow-up.    He has been doing well.  He is a little bit limited by leg pain but still gets in 10,000 steps a day. The patient denies any new symptoms such as chest discomfort, neck or arm discomfort. There has been no new shortness of breath, PND or orthopnea. There have been no reported palpitations, presyncope or syncope.     ROS:   Positive for back pain. Otherwise as stated in the HPI and negative for all  other systems.  Studies Reviewed:    EKG:   NA  Risk Assessment/Calculations:         Physical Exam:   VS:  BP (!) 100/56 (BP Location: Right Arm, Patient Position: Sitting, Cuff Size: Normal)   Pulse 69   Ht 5\' 2"  (1.575 m)   Wt 174 lb 9.6 oz (79.2 kg)   SpO2 93%   BMI 31.93 kg/m    Wt Readings from Last 3 Encounters:  12/19/23 174 lb 9.6 oz (79.2 kg)  12/14/23 174 lb 6.4 oz (79.1 kg)  07/10/23 175 lb 1.3 oz (79.4 kg)     GEN: Well nourished, well developed in no acute distress NECK: No JVD; No carotid bruits CARDIAC: RRR, no murmurs, rubs, gallops RESPIRATORY:  Clear to auscultation without rales, wheezing or rhonchi  ABDOMEN: Soft, non-tender, non-distended EXTREMITIES:  Trace leg edema; No deformity   ASSESSMENT AND PLAN:   CAD s/p CABG:   The patient has no new sypmtoms.  No further cardiovascular testing is indicated.  We will continue with aggressive risk reduction and meds as listed.   HTN:   The blood pressure is at target.  No change in therapy.  HYPERLIPIDEMIA:     LDL was 17.  He is now on Repatha .  I will stop the Zetia .     CAROTID STENOSIS:   This was  mild in 2022.  No further imaging at this time.    BRADYCARDIA:   He is now seen EP and he has no symptoms.  He knows to present and call 911 if he has any syncope or presyncope.  There is no indication for pacing at this point.      Follow up with me in one year.   Signed, Eilleen Grates, MD

## 2023-12-19 ENCOUNTER — Encounter: Payer: Self-pay | Admitting: Cardiology

## 2023-12-19 ENCOUNTER — Ambulatory Visit: Payer: PPO | Attending: Cardiology | Admitting: Cardiology

## 2023-12-19 VITALS — BP 100/56 | HR 69 | Ht 62.0 in | Wt 174.6 lb

## 2023-12-19 DIAGNOSIS — I10 Essential (primary) hypertension: Secondary | ICD-10-CM

## 2023-12-19 DIAGNOSIS — R001 Bradycardia, unspecified: Secondary | ICD-10-CM

## 2023-12-19 DIAGNOSIS — E785 Hyperlipidemia, unspecified: Secondary | ICD-10-CM

## 2023-12-19 DIAGNOSIS — I2510 Atherosclerotic heart disease of native coronary artery without angina pectoris: Secondary | ICD-10-CM | POA: Diagnosis not present

## 2023-12-19 DIAGNOSIS — I6523 Occlusion and stenosis of bilateral carotid arteries: Secondary | ICD-10-CM

## 2023-12-19 NOTE — Patient Instructions (Addendum)
 Medication Instructions:  STOP Ezetimibe  (Zetia ) 10 mg daily  *If you need a refill on your cardiac medications before your next appointment, please call your pharmacy*  Follow-Up: At [facility], you and your health needs are our priority.  As part of our continuing mission to provide you with exceptional heart care, our providers are all part of one team.  This team includes your primary Cardiologist (physician) and Advanced Practice Providers or APPs (Physician Assistants and Nurse Practitioners) who all work together to provide you with the care you need, when you need it.  Your next appointment:   12 month(s)  Provider:   Eilleen Grates, MD     We recommend signing up for the patient portal called "MyChart".  Sign up information is provided on this After Visit Summary.  MyChart is used to connect with patients for Virtual Visits (Telemedicine).  Patients are able to view lab/test results, encounter notes, upcoming appointments, etc.  Non-urgent messages can be sent to your provider as well.   To learn more about what you can do with MyChart, go to [email or web address].   Other Instructions    1st Floor: - Lobby - Registration  - Pharmacy  - Lab - Cafe  2nd Floor: - PV Lab - Diagnostic Testing (echo, CT, nuclear med)  3rd Floor: - Vacant  4th Floor: - TCTS (cardiothoracic surgery) - AFib Clinic - Structural Heart Clinic - Vascular Surgery  - Vascular Ultrasound  5th Floor: - HeartCare Cardiology (general and EP) - Clinical Pharmacy for coumadin, hypertension, lipid, weight-loss medications, and med management appointments    Valet parking services will be available as well.

## 2023-12-19 NOTE — Addendum Note (Signed)
 Addended by: Filbert Huff on: 12/19/2023 03:47 PM   Modules accepted: Orders

## 2023-12-25 ENCOUNTER — Other Ambulatory Visit: Payer: Self-pay | Admitting: Family Medicine

## 2023-12-25 DIAGNOSIS — M79605 Pain in left leg: Secondary | ICD-10-CM

## 2023-12-26 ENCOUNTER — Other Ambulatory Visit (HOSPITAL_COMMUNITY): Payer: Self-pay

## 2023-12-26 MED ORDER — GABAPENTIN 300 MG PO CAPS
ORAL_CAPSULE | ORAL | 0 refills | Status: DC
  Filled 2023-12-26 – 2024-01-01 (×2): qty 360, 90d supply, fill #0

## 2023-12-27 ENCOUNTER — Other Ambulatory Visit: Payer: Self-pay

## 2023-12-27 DIAGNOSIS — H0288A Meibomian gland dysfunction right eye, upper and lower eyelids: Secondary | ICD-10-CM | POA: Diagnosis not present

## 2023-12-27 DIAGNOSIS — H43392 Other vitreous opacities, left eye: Secondary | ICD-10-CM | POA: Diagnosis not present

## 2023-12-27 DIAGNOSIS — Z961 Presence of intraocular lens: Secondary | ICD-10-CM | POA: Diagnosis not present

## 2023-12-27 DIAGNOSIS — H52203 Unspecified astigmatism, bilateral: Secondary | ICD-10-CM | POA: Diagnosis not present

## 2023-12-27 DIAGNOSIS — H0288B Meibomian gland dysfunction left eye, upper and lower eyelids: Secondary | ICD-10-CM | POA: Diagnosis not present

## 2023-12-27 DIAGNOSIS — H524 Presbyopia: Secondary | ICD-10-CM | POA: Diagnosis not present

## 2023-12-27 DIAGNOSIS — H43812 Vitreous degeneration, left eye: Secondary | ICD-10-CM | POA: Diagnosis not present

## 2023-12-27 DIAGNOSIS — E119 Type 2 diabetes mellitus without complications: Secondary | ICD-10-CM | POA: Diagnosis not present

## 2023-12-27 DIAGNOSIS — H401131 Primary open-angle glaucoma, bilateral, mild stage: Secondary | ICD-10-CM | POA: Diagnosis not present

## 2023-12-27 DIAGNOSIS — H5203 Hypermetropia, bilateral: Secondary | ICD-10-CM | POA: Diagnosis not present

## 2023-12-27 LAB — HM DIABETES EYE EXAM

## 2023-12-28 DIAGNOSIS — E119 Type 2 diabetes mellitus without complications: Secondary | ICD-10-CM | POA: Diagnosis not present

## 2023-12-28 DIAGNOSIS — L84 Corns and callosities: Secondary | ICD-10-CM | POA: Diagnosis not present

## 2024-01-01 ENCOUNTER — Other Ambulatory Visit (HOSPITAL_COMMUNITY): Payer: Self-pay

## 2024-01-01 ENCOUNTER — Other Ambulatory Visit: Payer: Self-pay

## 2024-01-24 ENCOUNTER — Other Ambulatory Visit (HOSPITAL_COMMUNITY): Payer: Self-pay

## 2024-01-24 NOTE — Patient Instructions (Incomplete)
 Good to see you again today

## 2024-01-24 NOTE — Progress Notes (Unsigned)
 [facility] [address] [place], [place] [address] [phone]  Date:  01/25/2024   Name:  Curtis Tyler   DOB:  1941-06-25   MRN:  [id number]  PCP:  Kaylee Partridge, MD    Chief Complaint: Rash (On both legs and lower back on left side; started about 3 days ago; think it may be "poison ivy")   History of Present Illness:  Curtis Tyler is a 83 y.o. very pleasant male patient who presents with the following:  Patient seen today with concern of a rash on his legs.  Most recent visit with myself was about 6 weeks ago for routine follow-up- history of mild diabetes, coronary artery disease status post CABG, hyperlipidemia, hypertension, carotid stenosis, BPH  He is followed by urology, cardiology, electrophysiology, neurology  He thinks he may have PI His wife is getting hip surgery next week and he wants to make sure he is not contagious He first noted a rash 3-4 days ago It is itchy but not terrible Otherwise he feels fine- no facial swelling or difficulty breathing   Patient Active Problem List   Diagnosis Date Noted   Left lower quadrant abdominal pain 07/04/2023   LUQ pain 09/15/2021   Lung nodule 04/10/2020   Elevated bilirubin 05/21/2019   Diabetes mellitus, type 2 (HCC) 05/20/2019   Dyslipidemia 05/03/2019   Bradycardia 09/12/2018   Plantar porokeratosis, acquired 05/16/2018   Abnormal stress ECG with treadmill 02/12/2016   Colonic polyp 09/21/2015   Left lower quadrant pain 09/03/2015   Cellulitis of right lower extremity 05/14/2015   Need for immunization against influenza 05/14/2015   Bilateral low back pain with sciatica 09/03/2014   Hip pain, bilateral 09/03/2014   Bilateral carotid artery stenosis 07/21/2014   Myalgia 07/21/2014   Acute pain of left knee 07/15/2014   Benign prostatic hyperplasia with lower urinary tract symptoms 07/15/2014   History of elevated prostate specific antigen (PSA)  07/15/2014   Hypertrophy of nasal turbinates 07/15/2014   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 The LAD had a proximal 60-70% stenosis. There was mid 40% stenosis. Diagonal is moderate size with 40% stenosis. The circumflex system included a large OM with 60% ostial stenosis the RCA was occluded before the PDA. The EF was 65%.  2014   Glaucoma    beginning of glaucoma- uses drops    Hyperlipidemia    Hypertension    Leg fracture    right   Myocardial infarct (HCC) 1992   inferior posterior- discovered in 1992-? when happened    Obesity    Radiculopathy    Shortness of breath dyspnea    with exertion -     Past Surgical History:  Procedure Laterality Date   APPENDECTOMY  1958   CARDIAC CATHETERIZATION  05/2004   SHOWING 100% OCCLUDED DISTAL RIGHT CORONARY WITH  LEFT  TO RIGHT COLLATERALS, AS WELL AS ANTEGRADE FLOW, NORMAL LEFT MAIN, 50% NARROWING IN LEFT CIRCUMFLEX  WITH IRREGULARITIES IN THE LAD   CARDIAC CATHETERIZATION N/A 02/12/2016   Procedure: Left Heart Cath and Coronary Angiography;  Surgeon: Arleen Lacer, MD;  Location: [facility] INVASIVE CV LAB;  Service: Cardiovascular;  Laterality: N/A;   CARDIOVASCULAR STRESS TEST  02/26/2010   EF 65%, SMALL AREA OF MILD INFARCT AND POSSIBLE PER-INFARCT ISCHEMIA IN THE INFEROBASAL WALL   COLONOSCOPY     CORONARY ANGIOPLASTY     RIGHT CORONARY WERE UNSUCCESSFUL   CORONARY ARTERY  BYPASS GRAFT N/A 02/25/2016   Procedure: CORONARY ARTERY BYPASS GRAFTING (CABG) times four using the left internal mammary artery and the right saphenus vein ;  Surgeon: Zelphia Higashi, MD;  Location: [facility] OR;  Service: Open Heart Surgery;  Laterality: N/A;   HERNIA REPAIR  1960   right hernia repair   INTRAOPERATIVE TRANSESOPHAGEAL ECHOCARDIOGRAM N/A 02/25/2016   Procedure: INTRAOPERATIVE TRANSESOPHAGEAL ECHOCARDIOGRAM;  Surgeon: Zelphia Higashi, MD;  Location: [facility] OR;  Service: Open Heart Surgery;  Laterality: N/A;   UMBILICAL HERNIA REPAIR     US  ECHOCARDIOGRAPHY  01/03/2007   EF 55-60%    Social History   Tobacco Use   Smoking status: Former    Current packs/day: 0.00    Average packs/day: 1 pack/day for 25.0 years (25.0 ttl pk-yrs)    Types: Cigarettes    Start date: 07/23/1956    Quit date: 07/23/1981    Years since quitting: 42.5   Smokeless tobacco: Never  Vaping Use   Vaping status: Never Used  Substance Use Topics   Alcohol use: Yes    Alcohol/week: 7.0 standard drinks of alcohol    Types: 7 Glasses of wine per week    Comment: glass of wine a night    Drug use: No    Family History  Problem Relation Age of Onset   Congestive Heart Failure Mother    Diabetes Mother    Stroke Father    Hypertension Neg Hx    Colon cancer Neg Hx    Colon polyps Neg Hx    Esophageal cancer Neg Hx    Rectal cancer Neg Hx    Stomach cancer Neg Hx    Neuropathy Neg Hx    Alzheimer's disease Neg Hx    Dementia Neg Hx     No Known Allergies  Medication list has been reviewed and updated.  Current Outpatient Medications on File Prior to Visit  Medication Sig Dispense Refill   acetaminophen  (TYLENOL ) 650 MG CR tablet Take 650 mg by mouth every 8 (eight) hours as needed for pain.     amLODipine  (NORVASC ) 5 MG tablet Take 1 tablet (5 mg total) by mouth daily. 90 tablet 1   aspirin  81 MG tablet Take 81 mg by mouth daily.     Coenzyme Q10 100 MG TABS Take 100 mg by mouth  daily.      dicyclomine  (BENTYL ) 10 MG capsule Take 1 capsule (10 mg total) by mouth every morning. May take an additional 3 times daily as needed. 50 capsule 3   Evolocumab  (REPATHA  SURECLICK) 140 MG/ML SOAJ Inject 140 mg into the skin every 14 (fourteen) days. 6 mL 3   fish oil-omega-3 fatty acids 1000 MG capsule Take 1,400 mg by mouth daily.     gabapentin  (NEURONTIN ) 300 MG capsule Take 1 capsule (300 mg total) by mouth in the morning AND 1 capsule (300 mg total) daily at  12 noon as needed AND 2 capsules (600 mg total) at bedtime. 360 capsule 0   Glucosamine-Chondroitin-MSM 500-200-150 MG TABS Take by mouth.     hydrochlorothiazide  (HYDRODIURIL ) 25 MG tablet Take 1 tablet (25 mg total) by mouth daily. 90 tablet 3   latanoprost  (XALATAN ) 0.005 % ophthalmic solution Place 1 drop into both eyes nightly. 7.5 mL 3   losartan  (COZAAR ) 100 MG tablet Take 1 tablet (100 mg total) by mouth daily. 90 tablet 1   methocarbamol  (ROBAXIN ) 500 MG tablet Take 1 tablet (500 mg total) by mouth 2 (two) times daily as needed for low back pain 30 tablet 0   mirabegron  ER (MYRBETRIQ ) 50 MG TB24 tablet Take 1 tablet (50 mg total) by mouth daily. 90 tablet 3   predniSONE  (STERAPRED UNI-PAK 21 TAB) 5 MG (21) TBPK tablet Take by mouth as directed for 6 days with food. 21 tablet 1   traZODone  (DESYREL ) 50 MG tablet Take 0.5-1 tablets (25-50 mg total) by mouth at bedtime as needed for sleep. 90 tablet 0   predniSONE  (DELTASONE ) 5 MG tablet Take 5 mg by mouth.     No current facility-administered medications on file prior to visit.    Review of Systems:  As per HPI- otherwise negative.   Physical Examination: Vitals:   01/25/24 1350  BP: 122/60  Pulse: 75  Temp: 97.9 F (36.6 C)  SpO2: 96%   Vitals:   01/25/24 1350  Weight: 170 lb (77.1 kg)  Height: 5\' 2"  (1.575 m)   Body mass index is 31.09 kg/m. Ideal Body Weight: Weight in (lb) to have BMI = 25: 136.4  GEN: No acute distress;  alert,appropriate. PULM: Breathing comfortably in no respiratory distress PSYCH: Normally interactive.  Mild rhus dermatitis on both lower legs, right arm and left trunk   Assessment and Plan: Rhus dermatitis Pt seen today with mild rhus dermatitis- he is not overly bothered but wanted to make sure this is not contagious to his wife who is having a hip replacement,  reassured that once the skin is washed this is not contagious Will use topical treatment as needed He will contact me if getting worse- right now oral steroids not needed   Signed Gates Kasal, MD

## 2024-01-25 ENCOUNTER — Encounter: Payer: Self-pay | Admitting: Family Medicine

## 2024-01-25 ENCOUNTER — Ambulatory Visit (INDEPENDENT_AMBULATORY_CARE_PROVIDER_SITE_OTHER): Admitting: Family Medicine

## 2024-01-25 VITALS — BP 122/60 | HR 75 | Temp 97.9°F | Ht 62.0 in | Wt 170.0 lb

## 2024-01-25 DIAGNOSIS — L255 Unspecified contact dermatitis due to plants, except food: Secondary | ICD-10-CM | POA: Diagnosis not present

## 2024-01-26 ENCOUNTER — Telehealth: Payer: Self-pay | Admitting: *Deleted

## 2024-01-26 NOTE — Telephone Encounter (Signed)
 See my chart message

## 2024-01-27 ENCOUNTER — Other Ambulatory Visit: Payer: Self-pay | Admitting: Family Medicine

## 2024-01-29 ENCOUNTER — Other Ambulatory Visit: Payer: Self-pay

## 2024-01-29 ENCOUNTER — Other Ambulatory Visit (HOSPITAL_COMMUNITY): Payer: Self-pay

## 2024-01-29 MED ORDER — AMLODIPINE BESYLATE 5 MG PO TABS
5.0000 mg | ORAL_TABLET | Freq: Every day | ORAL | 1 refills | Status: DC
  Filled 2024-01-29: qty 90, 90d supply, fill #0

## 2024-02-01 ENCOUNTER — Telehealth: Payer: Self-pay | Admitting: Cardiology

## 2024-02-01 NOTE — Telephone Encounter (Signed)
 Pt says he has been a little light headed since yesterday and feeling like he is in a "fog"... his BP has been consistent 116/60 HR staying in 70's... he does not have any other symptoms... no SOB, no chest pain or palpitations..his BP while talking to me was 145/76 hr 73.  No headache or weakness.   He will continue to monitor and if anything worsens or changes he will call and let us  know.   He will hydrate and eat well...he has been dieting to lose weight... he will try to eat some healthy snacks to be sure that his BS is not dropping too low.   I will alert Dr Lavonne Prairie for any further recommendations.

## 2024-02-01 NOTE — Telephone Encounter (Signed)
 STAT if patient feels like he/she is going to faint   1. Are you feeling dizzy, lightheaded, or faint right now? lightheaded    2. Have you passed out?  No  (If yes move to .SYNCOPECHMG)   3. Do you have any other symptoms? No    4. Have you checked your HR and BP (record if available)? 116/66

## 2024-02-02 ENCOUNTER — Telehealth: Payer: Self-pay | Admitting: Cardiology

## 2024-02-02 NOTE — Telephone Encounter (Signed)
 Eilleen Grates, MD  You5 minutes ago (11:01 AM)    No change in therapy unless he is having increasing symptoms.  He can call us  back if the dizziness persists.   Pt advised and will keep in touch with us  an let us  know how he is doing

## 2024-02-02 NOTE — Telephone Encounter (Signed)
 Will close this encounter and document on other open encounter.

## 2024-02-02 NOTE — Telephone Encounter (Signed)
 Pt c/o BP issue:  1. What are your last 5 BP readings? Yesterday  it was 141/66, 116/66,  today 124/65, 184/51 94/49 and 84/51  2. Are you having any other symptoms (ex. Dizziness, headache, blurred vision, passed out)? Eye feel tired and a little discomfort in his eyes  3. What is your medication issue? Blood pressure is lowl

## 2024-02-02 NOTE — Telephone Encounter (Signed)
 Pt called back this morning reporting that his head fogginess had improved yesterday after hydrating well..his BP when he got up this morning was 124/65 and after taking his Losartan  100 mg at 7 am his BP now is 84/51.... he feels fairly well... just says his eyes are tired.. no dizziness, no SOB.   He take his Amlodipine  at night.   He will continue to monitor, hydrate, he has eaten breakfast this morning.   I will send to Dr Lavonne Prairie for his review.

## 2024-02-14 ENCOUNTER — Other Ambulatory Visit (HOSPITAL_COMMUNITY): Payer: Self-pay

## 2024-02-15 NOTE — Telephone Encounter (Signed)
 Pt called back to report that his BP is still fluctuating... he gets up to the 140's and down under 100... all at different times of the day. He says he hydrates but when it gets down low he gets dizzy and feels like he is in a fog.   Pt says he held his HCTZ to see if it prevented the low readings but he says his BP shot up and so he decreased it by 1/2 for a few more days and did not have much of a difference.   I made him an appt for tomorrow with an APP.Aaron Aas he will bring his readings and his cuff with him to the appt.

## 2024-02-15 NOTE — Progress Notes (Signed)
 Cardiology Office Note    Patient Name: Curtis Tyler Date of Encounter: 02/15/2024  Primary Care Provider:  Watt Harlene BROCKS, MD Primary Cardiologist:  Lynwood Schilling, MD Primary Electrophysiologist: None   Past Medical History    Past Medical History:  Diagnosis Date   Arthritis    BPH (benign prostatic hyperplasia)    Bunion    Callus    Carotid artery occlusion    Coronary artery disease    left main 60% stenosis. The LAD had a proximal 60-70% stenosis. There was mid 40% stenosis. Diagonal is moderate size with 40% stenosis. The circumflex system included a large OM with 60% ostial stenosis the RCA was occluded before the PDA. The EF was 65%.  2014   Glaucoma    beginning of glaucoma- uses drops    Hyperlipidemia    Hypertension    Leg fracture    right   Myocardial infarct (HCC) 1992   inferior posterior- discovered in 1992-? when happened    Obesity    Radiculopathy    Shortness of breath dyspnea    with exertion -     History of Present Illness  Curtis Tyler is a 83 y.o. 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  Curtis Tyler was previously followed by Dr. Tisa and is currently followed by Dr. Schilling from that of CAD.  He has a history of coronary artery disease prior inferior posterior MI in 1992 and CABG x 4 in 2017 (LIMA to LAD, SVG to posterior descending, sequential saphenous vein graft to obtuse marginals of 1 and 2).  Experience bradycardia following bypass surgery and had beta-blocker discontinued.  He wore a Holter monitor that showed Mobitz type I with brief run of 2-1 AV block without symptoms. He completed a carotid Doppler study in 2022 that demonstrated bilateral 1-39% stenosis.  He was seen by Dr. Schilling on  05/25/2023 EKG was completed showing 2-1 Mobitz type I patient wore a 3-day Zio patch that showed first-degree AVB with brief run of third-degree AVB while sleeping.  He was referred to EP and evaluated by Dr. Inocencio with advisement to hold off on pacemaker implant at that time.    He was last seen by Dr. Schilling on 12/19/2023 reported doing well walking 10,000 steps a day.  His blood pressure was 100/56 changes in current therapy.  He contacted our office on 02/01/2024 with complaint of lightheadedness and fluctuating BP.  He noted head fogginess that improved after hydrating with BP dropping to 84/51 after taking losartan  100 mg.  He is currently taking amlodipine  5 mg at night.  Curtis Tyler presents today for complaint of labile blood pressures and dizziness. He describes symptoms of dizziness and a lack of coordination between his head and feet, particularly when walking, along with a sensation of 'cloudiness' in his head. He has been managing his medications, including adjusting his diuretic dose. He has experienced weight loss, having intentionally lost 13 pounds, which he notes has affected his blood pressure. His weight fluctuated between 173 and 175 pounds, but he recently increased to 179 pounds before losing weight again. He attributes some of his weight loss to dietary changes, including reducing portion sizes and avoiding late-night snacking. He is currently taking several medications,  including losartan , hydrochlorothiazide , and gabapentin . He has adjusted his hydrochlorothiazide  dose to half a pill. Patient denies chest pain, palpitations, dyspnea, PND, orthopnea, nausea, vomiting, dizziness, syncope, edema, weight gain, or early satiety.  Discussed the use of AI scribe software for clinical note transcription with the patient, who gave verbal consent to proceed.  History of Present Illness   Review of Systems  Please see the history of present illness.    All other systems reviewed and are  otherwise negative except as noted above.  Physical Exam    Wt Readings from Last 3 Encounters:  01/25/24 170 lb (77.1 kg)  12/19/23 174 lb 9.6 oz (79.2 kg)  12/14/23 174 lb 6.4 oz (79.1 kg)   CD:Uyzmz were no vitals filed for this visit.,There is no height or weight on file to calculate BMI. GEN: Well nourished, well developed in no acute distress Neck: No JVD; No carotid bruits Pulmonary: Clear to auscultation without rales, wheezing or rhonchi  Cardiovascular: Normal rate. Regular rhythm. Normal S1. Normal S2.   Murmurs: There is no murmur.  ABDOMEN: Soft, non-tender, non-distended EXTREMITIES:  No edema; No deformity   EKG/LABS/ Recent Cardiac Studies   ECG personally reviewed by me today -none completed today  Risk Assessment/Calculations:          Lab Results  Component Value Date   WBC 6.4 12/05/2023   HGB 16.8 12/05/2023   HCT 49.3 12/05/2023   MCV 95.1 12/05/2023   PLT 182.0 12/05/2023   Lab Results  Component Value Date   CREATININE 1.11 07/10/2023   BUN 27 (H) 07/10/2023   NA 137 07/10/2023   K 3.7 07/10/2023   CL 95 (L) 07/10/2023   CO2 32 07/10/2023   Lab Results  Component Value Date   CHOL 109 12/05/2023   HDL 65.50 12/05/2023   LDLCALC 17 12/05/2023   LDLDIRECT 91.0 11/23/2022   TRIG 132.0 12/05/2023   CHOLHDL 2 12/05/2023    Lab Results  Component Value Date   HGBA1C 6.9 (H) 12/05/2023   Assessment & Plan    Assessment & Plan   1.  Orthostatic hypotension Orthostatic hypotension likely due to medication effects and recent weight loss.    Orthostatic VS for the past 24 hrs (Last 3 readings):  BP- Lying Pulse- Lying BP- Sitting Pulse- Sitting BP- Standing at 0 minutes Pulse- Standing at 0 minutes BP- Standing at 3 minutes Pulse- Standing at 3 minutes  02/16/24 1222 120/58 64 90/64 65 (!) 86/62 65 108/64 57    - Discontinue amlodipine  to assess response. - Continue hydrochlorothiazide  at 25 mg. - Continue losartan  at 100 mg, consider  reducing to 50 mg if symptoms persist. - Monitor blood pressure regularly and log readings. - Ensure adequate hydration, especially in hot weather. - Consider adjusting gabapentin  if symptoms persist after medication changes. - Administer losartan  at night and hydrochlorothiazide  during the day.  2.  History of CAD: -s/p inferior posterior MI in 1992 and CABG x 4 in 2017  - Currently reports no chest pain or angina since previous follow-up. - Continue current GDMT with ASA 81 mg, Repatha  140 mg q. 14 days  3.  Hyperlipidemia: - Patient's last LDL cholesterol was 17 - Continue Repatha  140 mg q. 14 days  4.  Carotid stenosis: - Bilateral 1-39% stenosis present - Currently asymptomatic - Continue Repatha  and ASA 81 mg  5.  History of second-degree Mobitz type I: - Currently followed by Dr. Inocencio with no indication for pacemaker at this  time - Patient aware to report symptoms of fatigue or low heart rate.  Disposition: Follow-up with Lynwood Schilling, MD as scheduled    Signed, Wyn Raddle, Jackee Shove, NP 02/15/2024, 6:39 PM [facility]

## 2024-02-15 NOTE — Telephone Encounter (Signed)
 Pt requesting a c/b from [place] he would like to give her a update. Please advise

## 2024-02-16 ENCOUNTER — Encounter: Payer: Self-pay | Admitting: Nurse Practitioner

## 2024-02-16 ENCOUNTER — Ambulatory Visit: Attending: Nurse Practitioner | Admitting: Nurse Practitioner

## 2024-02-16 VITALS — BP 124/60 | HR 65 | Ht 62.0 in | Wt 168.0 lb

## 2024-02-16 DIAGNOSIS — I6523 Occlusion and stenosis of bilateral carotid arteries: Secondary | ICD-10-CM

## 2024-02-16 DIAGNOSIS — I2510 Atherosclerotic heart disease of native coronary artery without angina pectoris: Secondary | ICD-10-CM

## 2024-02-16 DIAGNOSIS — I10 Essential (primary) hypertension: Secondary | ICD-10-CM

## 2024-02-16 DIAGNOSIS — I441 Atrioventricular block, second degree: Secondary | ICD-10-CM

## 2024-02-16 DIAGNOSIS — E785 Hyperlipidemia, unspecified: Secondary | ICD-10-CM

## 2024-02-16 NOTE — Patient Instructions (Addendum)
 Medication Instructions:  STOP Norvasc   TAKE Hydrochlorothiazide  25mg  daily in the mornings TAKE Losartan  daily in the evenings.  *If you need a refill on your cardiac medications before your next appointment, please call your pharmacy*  Lab Work: None ordered If you have labs (blood work) drawn today and your tests are completely normal, you will receive your results only by: MyChart Message (if you have MyChart) OR A paper copy in the mail If you have any lab test that is abnormal or we need to change your treatment, we will call you to review the results.  Testing/Procedures: None ordered  Follow-Up: At [facility], you and your health needs are our priority.  As part of our continuing mission to provide you with exceptional heart care, our providers are all part of one team.  This team includes your primary Cardiologist (physician) and Advanced Practice Providers or APPs (Physician Assistants and Nurse Practitioners) who all work together to provide you with the care you need, when you need it.  Your next appointment:   10 month(s) (April 2026)  Provider:   Eilleen Grates, MD   We recommend signing up for the patient portal called MyChart.  Sign up information is provided on this After Visit Summary.  MyChart is used to connect with patients for Virtual Visits (Telemedicine).  Patients are able to view lab/test results, encounter notes, upcoming appointments, etc.  Non-urgent messages can be sent to your provider as well.   To learn more about what you can do with MyChart, go to [email or web address].   Other Instructions Continue to monitor your blood pressure

## 2024-02-24 ENCOUNTER — Other Ambulatory Visit: Payer: Self-pay | Admitting: Family Medicine

## 2024-02-24 ENCOUNTER — Other Ambulatory Visit (HOSPITAL_COMMUNITY): Payer: Self-pay

## 2024-02-24 ENCOUNTER — Encounter: Payer: Self-pay | Admitting: Family Medicine

## 2024-02-24 DIAGNOSIS — E785 Hyperlipidemia, unspecified: Secondary | ICD-10-CM

## 2024-02-26 ENCOUNTER — Other Ambulatory Visit (HOSPITAL_COMMUNITY): Payer: Self-pay

## 2024-02-26 ENCOUNTER — Other Ambulatory Visit: Payer: Self-pay

## 2024-02-26 MED ORDER — PRAVASTATIN SODIUM 40 MG PO TABS
40.0000 mg | ORAL_TABLET | Freq: Every day | ORAL | 3 refills | Status: AC
  Filled 2024-02-26: qty 90, 90d supply, fill #0
  Filled 2024-05-30: qty 90, 90d supply, fill #1
  Filled 2024-08-07: qty 90, 90d supply, fill #2

## 2024-02-26 MED ORDER — LOSARTAN POTASSIUM 50 MG PO TABS
50.0000 mg | ORAL_TABLET | Freq: Every day | ORAL | 3 refills | Status: DC
Start: 2024-02-26 — End: 2024-04-26
  Filled 2024-02-26: qty 90, 90d supply, fill #0
  Filled 2024-03-07: qty 30, 30d supply, fill #1

## 2024-02-26 NOTE — Telephone Encounter (Signed)
 Pt calling to speak to nurse Harlene because he still has questions

## 2024-02-26 NOTE — Addendum Note (Signed)
 Addended by: PHILIS HARLENE SAUNDERS on: 02/26/2024 08:33 AM   Modules accepted: Orders

## 2024-02-26 NOTE — Telephone Encounter (Signed)
 Called patient back about message. Patient stated that he thinks he has been missed understood. Patient stated his BP in the AM before any medications is 130s/80's, then after taking on hydrochlorothiazide  SBP deeps dwon 2 hours later in the low 100's and 90's (107, 93, 106). Then in the afternoon patient stated his SBP goes back to normal. Patient stated he has been taking his losartan  100 mg at night and has no issues with this medication, and so he does not want to change his losartan . Patient feels like his hydrochlorothiazide  25 mg is to blame because he said like clock work, his BP drops two hours after taking it and then evens out by the afternoon. Patient stated he would benefit more by taking a reduced dose of hydrochlorothiazide . Encouraged patient tomorrow to take half of his hydrochlorothiazide  to see if he feels the same way. Will forward to [name] NP for advisement.

## 2024-02-27 NOTE — Progress Notes (Unsigned)
 [facility] [address] [place], [place] [address] [phone]  Date:  02/28/2024   Name:  Curtis Tyler   DOB:  01-Sep-1940   MRN:  [id number]  PCP:  Curtis Harlene BROCKS, MD    Chief Complaint: No chief complaint on file.   History of Present Illness:  Curtis Tyler is a 83 y.o. very pleasant male patient who presents with the following:  Patient seen today for blood pressure follow-up.  Most recent visit with myself was about a month ago with poison ivy-  history of mild diabetes, coronary artery disease status post CABG, hyperlipidemia, hypertension, carotid stenosis, BPH. He is followed by urology, cardiology, electrophysiology, neurology  He was seen by his cardiology nurse practitioner on June 20: Curtis Tyler is a 83 y.o. 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 ------------- 1.  Orthostatic hypotension Orthostatic hypotension likely due to medication effects and recent weight loss.   Orthostatic VS for the past 24 hrs (Last 3 readings):   BP- Lying Pulse- Lying BP- Sitting Pulse- Sitting BP- Standing at 0 minutes Pulse- Standing at 0 minutes BP- Standing at 3 minutes Pulse- Standing at 3 minutes  02/16/24 1222 120/58 64 90/64 65 (!) 86/62 65 108/64 57    - Discontinue amlodipine  to assess response. - Continue hydrochlorothiazide  at 25 mg. - Continue losartan  at 100 mg, consider reducing to 50 mg if symptoms persist. - Monitor blood pressure regularly and log readings. - Ensure adequate hydration, especially in hot weather. - Consider adjusting gabapentin  if symptoms persist after medication changes. - Administer losartan  at night and hydrochlorothiazide  during the day. 2.   History of CAD: -s/p inferior posterior MI in 1992 and CABG x 4 in 2017  - Currently reports no chest pain or angina since previous follow-up. - Continue current GDMT with ASA 81 mg, Repatha  140 mg q. 14 days 3.  Hyperlipidemia: - Patient's last LDL cholesterol was 17 - Continue Repatha  140 mg q. 14 days 4.  Carotid stenosis: - Bilateral 1-39% stenosis present - Currently asymptomatic - Continue Repatha  and ASA 81 mg 5.  History of second-degree Mobitz type I: - Currently followed by Dr. Inocencio with no indication for pacemaker at this time - Patient aware to report symptoms of fatigue or low heart rate.    Lab Results  Component Value Date   HGBA1C 6.9 (H) 12/05/2023   Connected with pt via mychart video Pt is at home and I am at office pt ID confirmed with 2 factors, he gives consent for a virtual visit today.  Pt and myself are on the visit today  Pt notes his BP was running quite low- for example 92/45.  He got in touch with cardiology and he was seen as above  They stopped his amlodipine  due to hypotension  [name] checks his BP every am first thing.  He notes his am BP is running fine 120- 130s/ over 80s However around 9 or 10 am his BP seems to drop and he feels dizzy.  He notes yesterday at 9am 103/57, the day prior 97/52 Pulse is running about 70  No syncope- he may feel lightheaded when his BP is too low  He has lost weight intentionally   Wt Readings from Last 3 Encounters:  02/16/24 168 lb (76.2 kg)  01/25/24 170 lb (77.1 kg)  12/19/23 174 lb 9.6 oz (79.2 kg)   He notes his weight is now 163  Patient Active Problem List   Diagnosis Date Noted   Left lower quadrant abdominal pain 07/04/2023   LUQ pain 09/15/2021   Lung nodule 04/10/2020   Elevated bilirubin 05/21/2019   Diabetes mellitus, type 2 (HCC) 05/20/2019   Dyslipidemia 05/03/2019   Bradycardia 09/12/2018   Plantar porokeratosis, acquired 05/16/2018   Abnormal stress ECG with treadmill 02/12/2016    Colonic polyp 09/21/2015   Left lower quadrant pain 09/03/2015   Cellulitis of right lower extremity 05/14/2015   Need for immunization against influenza 05/14/2015   Bilateral low back pain with sciatica 09/03/2014   Hip pain, bilateral 09/03/2014   Bilateral carotid artery stenosis 07/21/2014   Myalgia 07/21/2014   Acute pain of left knee 07/15/2014   Benign prostatic hyperplasia with lower urinary tract symptoms 07/15/2014   History of elevated prostate specific antigen (PSA) 07/15/2014   Hypertrophy of nasal turbinates 07/15/2014   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 The LAD had a proximal 60-70% stenosis. There was mid 40% stenosis. Diagonal is moderate size with 40% stenosis. The circumflex system included a large OM with 60% ostial stenosis the RCA was occluded before the PDA. The EF was 65%.  2014   Glaucoma    beginning of glaucoma- uses drops    Hyperlipidemia    Hypertension    Leg fracture    right   Myocardial infarct (HCC) 1992   inferior posterior- discovered in 1992-? when happened    Obesity    Radiculopathy    Shortness of breath dyspnea    with exertion -     Past Surgical History:  Procedure Laterality Date   APPENDECTOMY  1958   CARDIAC CATHETERIZATION  05/2004   SHOWING 100% OCCLUDED DISTAL RIGHT CORONARY WITH  LEFT  TO RIGHT COLLATERALS, AS WELL AS ANTEGRADE FLOW, NORMAL LEFT MAIN, 50%  NARROWING IN LEFT CIRCUMFLEX  WITH IRREGULARITIES IN THE LAD   CARDIAC CATHETERIZATION N/A 02/12/2016   Procedure: Left Heart Cath and Coronary Angiography;  Surgeon: Alm LELON Clay, MD;  Location: [facility] INVASIVE CV LAB;  Service: Cardiovascular;  Laterality: N/A;   CARDIOVASCULAR STRESS TEST  02/26/2010   EF 65%, SMALL AREA OF MILD INFARCT AND POSSIBLE PER-INFARCT ISCHEMIA IN THE INFEROBASAL WALL   COLONOSCOPY     CORONARY ANGIOPLASTY     RIGHT CORONARY WERE UNSUCCESSFUL   CORONARY ARTERY BYPASS GRAFT N/A 02/25/2016   Procedure: CORONARY ARTERY BYPASS GRAFTING (CABG) times four using the left internal mammary artery and the right saphenus vein ;  Surgeon: Elspeth JAYSON Millers, MD;  Location: [facility] OR;  Service: Open Heart Surgery;  Laterality: N/A;   HERNIA REPAIR  1960   right hernia  repair   INTRAOPERATIVE TRANSESOPHAGEAL ECHOCARDIOGRAM N/A 02/25/2016   Procedure: INTRAOPERATIVE TRANSESOPHAGEAL ECHOCARDIOGRAM;  Surgeon: Elspeth JAYSON Millers, MD;  Location: [facility] OR;  Service: Open Heart Surgery;  Laterality: N/A;   UMBILICAL HERNIA REPAIR     US  ECHOCARDIOGRAPHY  01/03/2007   EF 55-60%    Social History   Tobacco Use   Smoking status: Former    Current packs/day: 0.00    Average packs/day: 1 pack/day for 25.0 years (25.0 ttl pk-yrs)    Types: Cigarettes    Start date: 07/23/1956    Quit date: 07/23/1981    Years since quitting: 42.6   Smokeless tobacco: Never  Vaping Use   Vaping status: Never Used  Substance Use Topics   Alcohol use: Yes    Alcohol/week: 7.0 standard drinks of alcohol    Types: 7 Glasses of wine per week    Comment: glass of wine a night    Drug use: No    Family History  Problem Relation Age of Onset   Congestive Heart Failure Mother    Diabetes Mother    Stroke Father    Hypertension Neg Hx    Colon cancer Neg Hx    Colon polyps Neg Hx    Esophageal cancer Neg Hx    Rectal cancer Neg Hx    Stomach cancer Neg Hx    Neuropathy Neg Hx    Alzheimer's disease  Neg Hx    Dementia Neg Hx     No Known Allergies  Medication list has been reviewed and updated.  Current Outpatient Medications on File Prior to Visit  Medication Sig Dispense Refill   acetaminophen  (TYLENOL ) 650 MG CR tablet Take 650 mg by mouth every 8 (eight) hours as needed for pain.     aspirin  81 MG tablet Take 81 mg by mouth daily.     Coenzyme Q10 100 MG TABS Take 100 mg by mouth daily.      dicyclomine  (BENTYL ) 10 MG capsule Take 1 capsule (10 mg total) by mouth every morning. May take an additional 3 times daily as needed. (Patient not taking: Reported on 02/16/2024) 50 capsule 3   Evolocumab  (REPATHA  SURECLICK) 140 MG/ML SOAJ Inject 140 mg into the skin every 14 (fourteen) days. 6 mL 3   fish oil-omega-3 fatty acids 1000 MG capsule Take 1,400 mg by mouth daily.     gabapentin  (NEURONTIN ) 300 MG capsule Take 1 capsule (300 mg total) by mouth in the morning AND 1 capsule (300 mg total) daily at 12 noon as needed AND 2 capsules (600 mg total) at bedtime. 360 capsule 0   Glucosamine-Chondroitin-MSM 500-200-150 MG TABS Take by mouth.     hydrochlorothiazide  (HYDRODIURIL ) 25 MG tablet Take 1 tablet (25 mg total) by mouth daily. 90 tablet 3   latanoprost  (XALATAN ) 0.005 % ophthalmic solution Place 1 drop into both eyes nightly. 7.5 mL 3   losartan  (COZAAR ) 50 MG tablet Take 1 tablet (50 mg total) by mouth daily. 90 tablet 3   methocarbamol  (ROBAXIN ) 500 MG tablet Take 1 tablet (500 mg total) by mouth 2 (two) times daily as needed for low back pain 30 tablet 0   mirabegron  ER (MYRBETRIQ ) 50 MG TB24 tablet Take 1 tablet (50 mg total) by mouth daily. 90 tablet 3   pravastatin  (PRAVACHOL ) 40 MG tablet Take 1 tablet (40 mg total) by mouth daily. 90 tablet 3   predniSONE  (DELTASONE ) 5 MG tablet Take 5 mg by mouth.     predniSONE  (STERAPRED  UNI-PAK 21 TAB) 5 MG (21) TBPK tablet Take by mouth as directed for 6 days with food. (Patient not taking: Reported on 02/16/2024) 21 tablet 1   traZODone   (DESYREL ) 50 MG tablet Take 0.5-1 tablets (25-50 mg total) by mouth at bedtime as needed for sleep. 90 tablet 0   No current facility-administered medications on file prior to visit.    Review of Systems:  As per HPI- otherwise negative.   Physical Examination: There were no vitals filed for this visit. There were no vitals filed for this visit. There is no height or weight on file to calculate BMI. Ideal Body Weight:    Pt obseved via mychart video   Assessment and Plan: Hyperlipidemia with target LDL less than 70  Essential hypertension  Type 2 diabetes mellitus with chronic kidney disease, without long-term current use of insulin , unspecified CKD stage (HCC)  Coronary artery disease involving native coronary artery of native heart without angina pectoris  Pt seen virtually today with concern of hypotension on BP meds  Doing overall better but still running low in the late morning until about 1pm Will decrease hydrochlorothiazide  to 12.5 mg which is his preference over changing losartan  at this time He will let me know how this works for him    Signed Harlene Schroeder, MD

## 2024-02-28 ENCOUNTER — Telehealth (INDEPENDENT_AMBULATORY_CARE_PROVIDER_SITE_OTHER): Admitting: Family Medicine

## 2024-02-28 ENCOUNTER — Encounter: Payer: Self-pay | Admitting: Family Medicine

## 2024-02-28 VITALS — BP 101/63 | Ht 62.0 in

## 2024-02-28 DIAGNOSIS — E1122 Type 2 diabetes mellitus with diabetic chronic kidney disease: Secondary | ICD-10-CM

## 2024-02-28 DIAGNOSIS — I2510 Atherosclerotic heart disease of native coronary artery without angina pectoris: Secondary | ICD-10-CM

## 2024-02-28 DIAGNOSIS — I10 Essential (primary) hypertension: Secondary | ICD-10-CM | POA: Diagnosis not present

## 2024-02-28 DIAGNOSIS — E785 Hyperlipidemia, unspecified: Secondary | ICD-10-CM

## 2024-02-29 ENCOUNTER — Other Ambulatory Visit (HOSPITAL_COMMUNITY): Payer: Self-pay

## 2024-03-02 ENCOUNTER — Other Ambulatory Visit (HOSPITAL_COMMUNITY): Payer: Self-pay

## 2024-03-07 ENCOUNTER — Other Ambulatory Visit (HOSPITAL_COMMUNITY): Payer: Self-pay

## 2024-03-07 ENCOUNTER — Telehealth: Payer: Self-pay

## 2024-03-07 DIAGNOSIS — I10 Essential (primary) hypertension: Secondary | ICD-10-CM

## 2024-03-07 DIAGNOSIS — E1122 Type 2 diabetes mellitus with diabetic chronic kidney disease: Secondary | ICD-10-CM

## 2024-03-07 NOTE — Telephone Encounter (Signed)
 Copied from CRM [id number]. Topic: Clinical - Prescription Issue >> Mar 07, 2024  3:54 PM Deleta RAMAN wrote: Reason for CRM: patient is returning call for Md Copland regarding prescription mishap. You can reach him at [phone]

## 2024-03-07 NOTE — Telephone Encounter (Signed)
 Spoke with pt, pt states the cardiologist sent in a Rx of Losartan  50 mg, pt states I don't not want the 50 mg tablet, I want the 100 mg tablet and the 25 mg sent in to make it 75 mg before I go all the way down to 50 mg. Advised pt he could still do the 75 mg with a 50 mg tablet and a 25 mg tablet pt declined and is requesting PCP send in the dosage pt and provider discussed, advised pt a message would be sent back to PCP.

## 2024-03-07 NOTE — Telephone Encounter (Signed)
 Copied from CRM [id number]. Topic: Clinical - Medication Question >> Mar 07, 2024  3:34 PM Burnard DEL wrote: Reason for CRM: Patient is  upset  about is losartan  medication being switched to 50 mg.It looks like his cardiologist switched his dosage from 100 mg to 50 mg. Patient is unaware of this being done.Please advise or reach out to patient to discuss.

## 2024-03-09 ENCOUNTER — Other Ambulatory Visit (HOSPITAL_COMMUNITY): Payer: Self-pay

## 2024-03-09 ENCOUNTER — Other Ambulatory Visit: Payer: Self-pay | Admitting: Family Medicine

## 2024-03-11 ENCOUNTER — Other Ambulatory Visit (HOSPITAL_COMMUNITY): Payer: Self-pay

## 2024-03-11 MED ORDER — TRAZODONE HCL 50 MG PO TABS
25.0000 mg | ORAL_TABLET | Freq: Every evening | ORAL | 0 refills | Status: DC | PRN
  Filled 2024-03-11: qty 90, 90d supply, fill #0

## 2024-03-17 ENCOUNTER — Encounter: Payer: Self-pay | Admitting: Pharmacist Clinician (PhC)/ Clinical Pharmacy Specialist

## 2024-03-18 ENCOUNTER — Other Ambulatory Visit (HOSPITAL_COMMUNITY): Payer: Self-pay

## 2024-03-18 ENCOUNTER — Telehealth: Payer: Self-pay | Admitting: Pharmacy Technician

## 2024-03-18 ENCOUNTER — Telehealth (HOSPITAL_COMMUNITY): Payer: Self-pay

## 2024-03-18 NOTE — Telephone Encounter (Signed)
 Pharmacy Patient Advocate Encounter   Received notification from Pt Calls Messages that prior authorization for Repatha  is required/requested.   Insurance verification completed.   The patient is insured through [facility] ADVANTAGE/RX ADVANCE .   Per test claim: PA required; PA submitted to above mentioned insurance via CoverMyMeds Key/confirmation #/EOC [id number] Status is pending

## 2024-03-18 NOTE — Telephone Encounter (Signed)
 PA request has been Received. New Encounter has been or will be created for follow up. For additional info see Pharmacy Prior Auth telephone encounter from 03/18/24.

## 2024-03-19 ENCOUNTER — Other Ambulatory Visit (HOSPITAL_COMMUNITY): Payer: Self-pay

## 2024-03-19 NOTE — Telephone Encounter (Signed)
 Pharmacy Patient Advocate Encounter  Received notification from [facility] ADVANT[employer]/RX ADVANCE that Prior Authorization for Repatha  has been APPROVED from 03/18/24 to 03/18/25. Ran test claim, Copay is $0.00. This test claim was processed through [facility]- copay amounts may vary at other pharmacies due to pharmacy/plan contracts, or as the patient moves through the different stages of their insurance plan.   PA #/Case ID/Reference #: [id number]

## 2024-03-27 ENCOUNTER — Encounter: Payer: Self-pay | Admitting: Neurology

## 2024-03-27 DIAGNOSIS — R413 Other amnesia: Secondary | ICD-10-CM

## 2024-03-27 DIAGNOSIS — F03A0 Unspecified dementia, mild, without behavioral disturbance, psychotic disturbance, mood disturbance, and anxiety: Secondary | ICD-10-CM

## 2024-03-27 DIAGNOSIS — R4701 Aphasia: Secondary | ICD-10-CM

## 2024-03-27 NOTE — Telephone Encounter (Signed)
 Referral for Neuropsychology sent thru epic to [facility]

## 2024-04-01 NOTE — Addendum Note (Signed)
 Addended by: WATT RAISIN C on: 04/01/2024 10:15 AM   Modules accepted: Orders

## 2024-04-03 ENCOUNTER — Other Ambulatory Visit (INDEPENDENT_AMBULATORY_CARE_PROVIDER_SITE_OTHER)

## 2024-04-03 ENCOUNTER — Ambulatory Visit: Payer: Self-pay | Admitting: Family Medicine

## 2024-04-03 DIAGNOSIS — I10 Essential (primary) hypertension: Secondary | ICD-10-CM | POA: Diagnosis not present

## 2024-04-03 DIAGNOSIS — E1122 Type 2 diabetes mellitus with diabetic chronic kidney disease: Secondary | ICD-10-CM | POA: Diagnosis not present

## 2024-04-03 LAB — CBC
HCT: 50.5 % (ref 39.0–52.0)
Hemoglobin: 16.6 g/dL (ref 13.0–17.0)
MCHC: 32.9 g/dL (ref 30.0–36.0)
MCV: 94.9 fl (ref 78.0–100.0)
Platelets: 168 K/uL (ref 150.0–400.0)
RBC: 5.32 Mil/uL (ref 4.22–5.81)
RDW: 13.1 % (ref 11.5–15.5)
WBC: 6.1 K/uL (ref 4.0–10.5)

## 2024-04-03 LAB — COMPREHENSIVE METABOLIC PANEL WITH GFR
ALT: 15 U/L (ref 0–53)
AST: 16 U/L (ref 0–37)
Albumin: 4.6 g/dL (ref 3.5–5.2)
Alkaline Phosphatase: 60 U/L (ref 39–117)
BUN: 21 mg/dL (ref 6–23)
CO2: 31 meq/L (ref 19–32)
Calcium: 9.5 mg/dL (ref 8.4–10.5)
Chloride: 99 meq/L (ref 96–112)
Creatinine, Ser: 1.08 mg/dL (ref 0.40–1.50)
GFR: 62.58 mL/min (ref >60.00)
Glucose, Bld: 113 mg/dL — ABNORMAL HIGH (ref 70–99)
Potassium: 4.1 meq/L (ref 3.5–5.1)
Sodium: 138 meq/L (ref 135–145)
Total Bilirubin: 1.3 mg/dL — ABNORMAL HIGH (ref 0.2–1.2)
Total Protein: 7.4 g/dL (ref 6.0–8.3)

## 2024-04-03 LAB — HEMOGLOBIN A1C: Hgb A1c MFr Bld: 6.4 % (ref 4.6–6.5)

## 2024-04-03 LAB — TSH: TSH: 2.23 u[IU]/mL (ref 0.35–5.50)

## 2024-04-05 ENCOUNTER — Encounter

## 2024-04-05 ENCOUNTER — Other Ambulatory Visit: Payer: Self-pay

## 2024-04-05 ENCOUNTER — Other Ambulatory Visit (HOSPITAL_COMMUNITY): Payer: Self-pay

## 2024-04-05 MED ORDER — METHOCARBAMOL 500 MG PO TABS
500.0000 mg | ORAL_TABLET | Freq: Two times a day (BID) | ORAL | 0 refills | Status: AC | PRN
  Filled 2024-04-05: qty 30, 15d supply, fill #0

## 2024-04-06 NOTE — Patient Instructions (Signed)
 It was good to see you again today Recommend a flu shot and COVID booster this

## 2024-04-06 NOTE — Progress Notes (Addendum)
 [facility] [address], [address] [place], [place] [address] [phone]  Date:  04/08/2024   Name:  Curtis Tyler   DOB:  1941-01-18   MRN:  [id number]  PCP:  Curtis Harlene BROCKS, MD    Chief Complaint: No chief complaint on file.   History of Present Illness:  Curtis Tyler is a 83 y.o. very pleasant male patient who presents with the following:  Patient seen today for physical exam.  I last saw him in April for routine follow-up, and then in May for poison ivy -history of mild diabetes, coronary artery disease status post CABG, hyperlipidemia, hypertension, carotid stenosis, BPH.  Neurology is seeing him set up neuropsychiatric testing due to concerns about memory His cardiologist is Dr. Lavona.  He followed up with cardiology Curtis Tyler, nurse practitioner in June At that time he had complained of labile blood pressure and orthostatic hypotension thought to potentially to recent weight loss:  Discontinue amlodipine  to assess response. - Continue hydrochlorothiazide  at 25 mg. - Continue losartan  at 100 mg, consider reducing to 50 mg if symptoms persist. - Monitor blood pressure regularly and log readings. - Ensure adequate hydration, especially in hot weather. - Consider adjusting gabapentin  if symptoms persist after medication changes. - Administer losartan  at night and hydrochlorothiazide  during the day. 2.  History of CAD: -s/p inferior posterior MI in 1992 and CABG x 4 in 2017  - Currently reports no chest pain or angina since previous follow-up. - Continue current GDMT with ASA 81 mg, Repatha  140 mg q. 14 days 3.  Hyperlipidemia: - Patient's last LDL cholesterol was 17 - Continue Repatha  140 mg q. 14 days 4.  Carotid stenosis: - Bilateral 1-39% stenosis present - Currently asymptomatic - Continue Repatha  and ASA 81 mg 5.  History of second-degree Mobitz type I: - Currently followed by Dr. Inocencio with no  indication for pacemaker at this time - Patient aware to report symptoms of fatigue or low heart rate.  Curtis Tyler completed labs prior to his visit, see below We have seen minimal elevation of his bilirubin in the past, doubt significant finding Results for orders placed or performed in visit on 04/03/24  Hemoglobin A1c   Collection Time: 04/03/24  7:58 AM  Result Value Ref Range   Hgb A1c MFr Bld 6.4 4.6 - 6.5 %  TSH   Collection Time: 04/03/24  7:58 AM  Result Value Ref Range   TSH 2.23 0.35 - 5.50 uIU/mL  Comprehensive metabolic panel with GFR   Collection Time: 04/03/24  7:58 AM  Result Value Ref Range   Sodium 138 135 - 145 mEq/L   Potassium 4.1 3.5 - 5.1 mEq/L   Chloride 99 96 - 112 mEq/L   CO2 31 19 - 32 mEq/L   Glucose, Bld 113 (H) 70 - 99 mg/dL   BUN 21 6 - 23 mg/dL   Creatinine, Ser 8.91 0.40 - 1.50 mg/dL   Total Bilirubin 1.3 (H) 0.2 - 1.2 mg/dL   Alkaline Phosphatase 60 39 - 117 U/L   AST 16 0 - 37 U/L   ALT 15 0 - 53 U/L   Total Protein 7.4 6.0 - 8.3 g/dL   Albumin  4.6 3.5 - 5.2 g/dL   GFR 37.41 >39.99 mL/min   Calcium  9.5 8.4 - 10.5 mg/dL  CBC   Collection Time: 04/03/24  7:58 AM  Result Value Ref Range   WBC 6.1 4.0 - 10.5 K/uL  RBC 5.32 4.22 - 5.81 Mil/uL   Platelets 168.0 150.0 - 400.0 K/uL   Hemoglobin 16.6 13.0 - 17.0 g/dL   HCT 49.4 60.9 - 47.9 %   MCV 94.9 78.0 - 100.0 fl   MCHC 32.9 30.0 - 36.0 g/dL   RDW 86.8 88.4 - 84.4 %   Aspirin  81 mg Repatha  Gabapentin  HCTZ 25 Losartan  100 Myrbetriq  50 Pravastatin  Trazodone   This fall recommend COVID-vaccine and flu shot. Eye exam Get update foot exam Can also update urine micro He has completed Shingrix and RSV   Patient Active Problem List   Diagnosis Date Noted   Left lower quadrant abdominal pain 07/04/2023   LUQ pain 09/15/2021   Lung nodule 04/10/2020   Elevated bilirubin 05/21/2019   Diabetes mellitus, type 2 (HCC) 05/20/2019   Dyslipidemia 05/03/2019   Bradycardia 09/12/2018   Plantar  porokeratosis, acquired 05/16/2018   Abnormal stress ECG with treadmill 02/12/2016   Colonic polyp 09/21/2015   Left lower quadrant pain 09/03/2015   Cellulitis of right lower extremity 05/14/2015   Need for immunization against influenza 05/14/2015   Bilateral low back pain with sciatica 09/03/2014   Hip pain, bilateral 09/03/2014   Bilateral carotid artery stenosis 07/21/2014   Myalgia 07/21/2014   Acute pain of left knee 07/15/2014   Benign prostatic hyperplasia with lower urinary tract symptoms 07/15/2014   History of elevated prostate specific antigen (PSA) 07/15/2014   Hypertrophy of nasal turbinates 07/15/2014   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 The LAD had a proximal 60-70% stenosis. There was mid 40% stenosis. Diagonal is moderate size with 40% stenosis. The circumflex system included a large OM with 60% ostial stenosis the RCA was occluded before the PDA. The EF was 65%.  2014   Glaucoma    beginning of glaucoma- uses drops    Hyperlipidemia    Hypertension    Leg fracture    right   Myocardial infarct (HCC) 1992   inferior posterior- discovered in 1992-? when happened    Obesity    Radiculopathy    Shortness of breath dyspnea    with exertion -     Past Surgical History:  Procedure Laterality Date   APPENDECTOMY  1958   CARDIAC CATHETERIZATION  05/2004   SHOWING 100% OCCLUDED DISTAL RIGHT CORONARY  WITH  LEFT  TO RIGHT COLLATERALS, AS WELL AS ANTEGRADE FLOW, NORMAL LEFT MAIN, 50% NARROWING IN LEFT CIRCUMFLEX  WITH IRREGULARITIES IN THE LAD   CARDIAC CATHETERIZATION N/A 02/12/2016   Procedure: Left Heart Cath and Coronary Angiography;  Surgeon: Alm LELON Clay, MD;  Location: [facility] INVASIVE CV LAB;  Service: Cardiovascular;  Laterality: N/A;   CARDIOVASCULAR STRESS TEST  02/26/2010   EF 65%, SMALL AREA OF MILD INFARCT AND POSSIBLE PER-INFARCT ISCHEMIA IN THE INFEROBASAL WALL   COLONOSCOPY     CORONARY ANGIOPLASTY     RIGHT CORONARY WERE UNSUCCESSFUL   CORONARY ARTERY BYPASS  GRAFT N/A 02/25/2016   Procedure: CORONARY ARTERY BYPASS GRAFTING (CABG) times four using the left internal mammary artery and the right saphenus vein ;  Surgeon: Elspeth JAYSON Millers, MD;  Location: [facility] OR;  Service: Open Heart Surgery;  Laterality: N/A;   HERNIA REPAIR  1960   right hernia repair   INTRAOPERATIVE TRANSESOPHAGEAL ECHOCARDIOGRAM N/A 02/25/2016   Procedure: INTRAOPERATIVE TRANSESOPHAGEAL ECHOCARDIOGRAM;  Surgeon: Elspeth JAYSON Millers, MD;  Location: [facility] OR;  Service: Open Heart Surgery;  Laterality: N/A;   UMBILICAL HERNIA REPAIR     US  ECHOCARDIOGRAPHY  01/03/2007   EF 55-60%    Social History   Tobacco Use   Smoking status: Former    Current packs/day: 0.00    Average packs/day: 1 pack/day for 25.0 years (25.0 ttl pk-yrs)    Types: Cigarettes    Start date: 07/23/1956    Quit date: 07/23/1981    Years since quitting: 42.7   Smokeless tobacco: Never  Vaping Use   Vaping status: Never Used  Substance Use Topics   Alcohol use: Yes    Alcohol/week: 7.0 standard drinks of alcohol    Types: 7 Glasses of wine per week    Comment: glass of wine a night    Drug use: No    Family History  Problem Relation Age of Onset   Congestive Heart Failure Mother    Diabetes Mother    Stroke Father    Hypertension Neg Hx    Colon cancer Neg Hx    Colon polyps Neg Hx    Esophageal cancer Neg Hx    Rectal  cancer Neg Hx    Stomach cancer Neg Hx    Neuropathy Neg Hx    Alzheimer's disease Neg Hx    Dementia Neg Hx     No Known Allergies  Medication list has been reviewed and updated.  Current Outpatient Medications on File Prior to Visit  Medication Sig Dispense Refill   acetaminophen  (TYLENOL ) 650 MG CR tablet Take 650 mg by mouth every 8 (eight) hours as needed for pain.     aspirin  81 MG tablet Take 81 mg by mouth daily.     Coenzyme Q10 100 MG TABS Take 100 mg by mouth daily.      dicyclomine  (BENTYL ) 10 MG capsule Take 1 capsule (10 mg total) by mouth every morning. May take an additional 3 times daily as needed. 50 capsule 3   Evolocumab  (REPATHA  SURECLICK) 140 MG/ML SOAJ Inject 140 mg into the skin every 14 (fourteen) days. 6 mL 3   fish oil-omega-3 fatty acids 1000 MG capsule Take 1,400 mg by mouth daily.     gabapentin  (NEURONTIN ) 300 MG capsule Take 1 capsule (300 mg total) by mouth in the morning AND 1 capsule (300 mg total) daily at 12 noon as needed AND 2 capsules (600 mg total) at bedtime. 360 capsule 0   Glucosamine-Chondroitin-MSM 500-200-150 MG TABS Take by mouth.     hydrochlorothiazide  (HYDRODIURIL ) 25 MG tablet Take 1 tablet (25 mg total) by mouth daily. 90 tablet 3   latanoprost  (XALATAN ) 0.005 % ophthalmic solution Place 1 drop into both eyes nightly. 7.5 mL 3   losartan  (COZAAR ) 50 MG tablet Take 1 tablet (50 mg total) by mouth daily. 90 tablet 3   methocarbamol  (ROBAXIN ) 500 MG tablet Take 1 tablet (500 mg total) by mouth 2 (two) times daily as needed for low back pain. 30 tablet 0   mirabegron  ER (MYRBETRIQ ) 50 MG TB24 tablet Take 1  tablet (50 mg total) by mouth daily. 90 tablet 3   pravastatin  (PRAVACHOL ) 40 MG tablet Take 1 tablet (40 mg total) by mouth daily. 90 tablet 3   predniSONE  (DELTASONE ) 5 MG tablet Take 5 mg by mouth.     predniSONE  (STERAPRED UNI-PAK 21 TAB) 5 MG (21) TBPK tablet Take by mouth as directed for 6 days with food. 21 tablet 1   traZODone   (DESYREL ) 50 MG tablet Take 0.5-1 tablets (25-50 mg total) by mouth at bedtime as needed for sleep. 90 tablet 0   No current facility-administered medications on file prior to visit.    Review of Systems:  As per HPI- otherwise negative.   Physical Examination: There were no vitals filed for this visit. There were no vitals filed for this visit. There is no height or weight on file to calculate BMI. Ideal Body Weight:    GEN: no acute distress. HEENT: Atraumatic, Normocephalic.  Ears and Nose: No external deformity. CV: RRR, No M/G/R. No JVD. No thrill. No extra heart sounds. PULM: CTA B, no wheezes, crackles, rhonchi. No retractions. No resp. distress. No accessory muscle use. ABD: S, NT, ND, +BS. No rebound. No HSM. EXTR: No c/c/e PSYCH: Normally interactive. Conversant.  Foot exam  Assessment and Plan: *** Physical exam today.  Encouraged healthy diet and exercise routine Will plan further follow- up pending labs.  Signed Harlene Schroeder, MD

## 2024-04-08 ENCOUNTER — Ambulatory Visit (INDEPENDENT_AMBULATORY_CARE_PROVIDER_SITE_OTHER): Admitting: Family Medicine

## 2024-04-08 ENCOUNTER — Encounter: Payer: Self-pay | Admitting: Family Medicine

## 2024-04-08 VITALS — BP 118/70 | HR 66 | Temp 97.8°F | Resp 16 | Ht 62.0 in | Wt 162.0 lb

## 2024-04-08 DIAGNOSIS — E785 Hyperlipidemia, unspecified: Secondary | ICD-10-CM

## 2024-04-08 DIAGNOSIS — Z0000 Encounter for general adult medical examination without abnormal findings: Secondary | ICD-10-CM | POA: Diagnosis not present

## 2024-04-08 DIAGNOSIS — I10 Essential (primary) hypertension: Secondary | ICD-10-CM

## 2024-04-08 DIAGNOSIS — E1122 Type 2 diabetes mellitus with diabetic chronic kidney disease: Secondary | ICD-10-CM

## 2024-04-08 DIAGNOSIS — R42 Dizziness and giddiness: Secondary | ICD-10-CM | POA: Diagnosis not present

## 2024-04-08 DIAGNOSIS — I2510 Atherosclerotic heart disease of native coronary artery without angina pectoris: Secondary | ICD-10-CM | POA: Diagnosis not present

## 2024-04-08 DIAGNOSIS — I6523 Occlusion and stenosis of bilateral carotid arteries: Secondary | ICD-10-CM

## 2024-04-08 DIAGNOSIS — N401 Enlarged prostate with lower urinary tract symptoms: Secondary | ICD-10-CM | POA: Diagnosis not present

## 2024-04-09 ENCOUNTER — Encounter: Payer: Self-pay | Admitting: Family Medicine

## 2024-04-09 DIAGNOSIS — E119 Type 2 diabetes mellitus without complications: Secondary | ICD-10-CM | POA: Diagnosis not present

## 2024-04-09 DIAGNOSIS — L84 Corns and callosities: Secondary | ICD-10-CM | POA: Diagnosis not present

## 2024-04-09 LAB — FERRITIN: Ferritin: 119.1 ng/mL (ref 22.0–322.0)

## 2024-04-09 LAB — MICROALBUMIN / CREATININE URINE RATIO
Creatinine,U: 67.7 mg/dL
Microalb Creat Ratio: 19.8 mg/g (ref 0.0–30.0)
Microalb, Ur: 1.3 mg/dL (ref 0.0–1.9)

## 2024-04-09 LAB — VITAMIN B12: Vitamin B-12: 402 pg/mL (ref 211–911)

## 2024-04-15 ENCOUNTER — Encounter: Payer: Self-pay | Admitting: Family Medicine

## 2024-04-15 ENCOUNTER — Encounter: Payer: PPO | Admitting: Psychology

## 2024-04-24 ENCOUNTER — Telehealth: Payer: Self-pay | Admitting: Cardiology

## 2024-04-24 NOTE — Progress Notes (Unsigned)
 Cardiology Office Note:   Date:  04/26/2024  ID:  Curtis Tyler, DOB 1940/12/29, MRN [id number] PCP: Watt Harlene BROCKS, MD  [facility] Providers Cardiologist:  Lynwood Schilling, MD {  History of Present Illness:   Curtis Tyler is a 83 y.o. male who presents for follow up of CAD.  He had a cardiac cath in 2014 demonstrating left main 60% stenosis. The LAD had a proximal 60-70% stenosis. There was mid 40% stenosis. Diagonal is moderate size with 40% stenosis. The circumflex system included a large OM with 60% ostial stenosis the RCA was occluded before the PDA. The EF was 65%. However, we decided to manage him medically unless he had worsening symptoms. In May 2017 I sent him for a screening POET (Plain Old Exercise Treadmill).  This was abnormal with ST depression and was a high risk study. He then underwent cardiac catheterization where he was found to have a 60% ostial left main, a 90% proximal LAD, 90% proximal circumflex, and a diffusely diseased right coronary with a chronically totally occluded posterior descending.  He was taken the operating room on 02/25/2016 for CABG x 4 by Dr. Kerrin.   He had bradycardia and I stopped his beta blocker.  A Holter demonstrated sinus with Mobitz Type 1.  He did have brief runs of 2:1 block without symptoms.  There were no sustained pauses.  He had a monitor that demonstrated 2-1 AV block.  He had some complete heart block.  However, he had no symptoms.  He saw Dr. Inocencio and he did not require pacing.    Since I last saw him he has had problems with his BP.  It has been elevated and he has had multiple medication manipulations first in our clinic and then by his primary provider.  During all of this he has felt very foggy.  He is cut back on his Neurontin  without improvement.  He really thinks amlodipine  was making him foggy.  He does not think he was tolerating HCTZ.  He is now on Cozaar  alone.  He says he is little bit better but his blood pressure  is not controlled.  He does have bradycardia as noted.  He has had some heart rates in the 48-43's but when I see most of them they seem to be in the 60s.  He is not having any new chest pressure, neck or arm discomfort.  Is not having any new shortness of breath, PND or orthopnea.  He has had no weight gain or edema.  ROS: As stated in the HPI and negative for all other systems.  Studies Reviewed:    EKG:     Sinus rhythm, rate 69, axis within normal limits, first-degree AV block, RSR prime V1, leftward axis, no acute ST-T wave changes, 12/19/2023  Risk Assessment/Calculations:       Physical Exam:   VS:  BP (!) 168/73 (BP Location: Left Arm, Patient Position: Sitting, Cuff Size: Normal)   Pulse 63   Resp 16   Ht 5' 2 (1.575 m)   Wt 163 lb (73.9 kg)   SpO2 97%   BMI 29.81 kg/m    Wt Readings from Last 3 Encounters:  04/26/24 163 lb (73.9 kg)  04/08/24 162 lb (73.5 kg)  02/16/24 168 lb (76.2 kg)     GEN: Well nourished, well developed in no acute distress NECK: No JVD; No carotid bruits CARDIAC: RRR, no murmurs, rubs, gallops RESPIRATORY:  Clear to auscultation without rales, wheezing or rhonchi  ABDOMEN: Soft, non-tender, non-distended EXTREMITIES:  No edema; No deformity   ASSESSMENT AND PLAN:   CAD s/p CABG: He is not having any new chest discomfort.  He is being active.  No change in therapy.  HTN:   The blood pressure is currently not controlled.  I am going to try spironolactone  12.5 mg daily.  He will give me a blood pressure diary in the next step would probably be to increase this if he tolerates it and his pressure is not controlled.  We will take a small measured steps.    HYPERLIPIDEMIA:     LDL was 17 in April.  No change in therapy.    CAROTID STENOSIS:   This was mild in in 2022.  No further imaging.    BRADYCARDIA: His symptoms are not necessarily related to this.  He is getting over the dizziness and his heart rate still runs low.  He has been evaluated by  EP.  At this point there is no indication for pacing.  I will however continue to follow this.  DIZZINESS: He thinks is related to some of his medications.  It does not seem to be associated with blood pressure and that he can be normotensive and still dizzy.  I have asked him to discontinue the Neurontin  altogether to see if that helps.  I have also suggested possibly ENT.    Follow up with HTN clinic in 3 months.   Signed, Lynwood Schilling, MD

## 2024-04-24 NOTE — Telephone Encounter (Signed)
 Error

## 2024-04-26 ENCOUNTER — Ambulatory Visit: Attending: Cardiology | Admitting: Cardiology

## 2024-04-26 ENCOUNTER — Other Ambulatory Visit: Payer: Self-pay

## 2024-04-26 ENCOUNTER — Encounter: Payer: Self-pay | Admitting: Cardiology

## 2024-04-26 ENCOUNTER — Other Ambulatory Visit (HOSPITAL_COMMUNITY): Payer: Self-pay

## 2024-04-26 VITALS — BP 168/73 | HR 63 | Resp 16 | Ht 62.0 in | Wt 163.0 lb

## 2024-04-26 DIAGNOSIS — I2510 Atherosclerotic heart disease of native coronary artery without angina pectoris: Secondary | ICD-10-CM

## 2024-04-26 DIAGNOSIS — I6529 Occlusion and stenosis of unspecified carotid artery: Secondary | ICD-10-CM | POA: Diagnosis not present

## 2024-04-26 DIAGNOSIS — E785 Hyperlipidemia, unspecified: Secondary | ICD-10-CM

## 2024-04-26 DIAGNOSIS — R001 Bradycardia, unspecified: Secondary | ICD-10-CM

## 2024-04-26 DIAGNOSIS — I10 Essential (primary) hypertension: Secondary | ICD-10-CM

## 2024-04-26 MED ORDER — SPIRONOLACTONE 25 MG PO TABS
12.5000 mg | ORAL_TABLET | Freq: Every day | ORAL | 3 refills | Status: DC
  Filled 2024-04-26: qty 45, 90d supply, fill #0
  Filled 2024-04-26: qty 15, 30d supply, fill #0
  Filled 2024-05-20: qty 15, 30d supply, fill #1
  Filled 2024-06-13: qty 15, 30d supply, fill #2

## 2024-04-26 NOTE — Patient Instructions (Addendum)
 Medication Instructions:  Start Spironolactone  12.5 mg once daily *If you need a refill on your cardiac medications before your next appointment, please call your pharmacy*  Lab Work: NONE If you have labs (blood work) drawn today and your tests are completely normal, you will receive your results only by: MyChart Message (if you have MyChart) OR A paper copy in the mail If you have any lab test that is abnormal or we need to change your treatment, we will call you to review the results.  Testing/Procedures: NONE  Follow-Up: At [facility], you and your health needs are our priority.  As part of our continuing mission to provide you with exceptional heart care, our providers are all part of one team.  This team includes your primary Cardiologist (physician) and Advanced Practice Providers or APPs (Physician Assistants and Nurse Practitioners) who all work together to provide you with the care you need, when you need it.  Your next appointment:   PharmD for hypertension in 2 months   We recommend signing up for the patient portal called MyChart.  Sign up information is provided on this After Visit Summary.  MyChart is used to connect with patients for Virtual Visits (Telemedicine).  Patients are able to view lab/test results, encounter notes, upcoming appointments, etc.  Non-urgent messages can be sent to your provider as well.   To learn more about what you can do with MyChart, go to [email or web address].   Other Instructions Blood pressure diary: take you blood pressure twice daily for 10 days

## 2024-05-03 ENCOUNTER — Telehealth: Payer: Self-pay | Admitting: Cardiology

## 2024-05-03 NOTE — Telephone Encounter (Signed)
 Pt c/o BP issue: STAT if pt c/o blurred vision, one-sided weakness or slurred speech.  STAT if BP is GREATER than 180/120 TODAY.  STAT if BP is LESS than 90/60 and SYMPTOMATIC TODAY  1. What is your BP concern?   Patient wants a call back to discuss his BP readings  2. Have you taken any BP medication today?  Yes  3. What are your last 5 BP readings?  171/95 144/81 127/66 143/78 164/90  4. Are you having any other symptoms (ex. Dizziness, headache, blurred vision, passed out)?   Lightheaded, sometimes a slight headache  Patient is concerned about his high BP readings.

## 2024-05-03 NOTE — Telephone Encounter (Signed)
 Called pt in regards to BP readings.  Reports the following 1st reading is prior to taking medication, 2nd mid day and 3rd at bedtime.  9/2-136/76, 124/69, 132/72 9/3- 162/94, 141/84, 171/95 9/4- 144/81, 127/66, 143/78 9/5- 164/90  Reports HR runs 60-74  takes BP meds after 1st BP reading of the day.    Denies HA and increased salt intake.   Per pt has a recent June hx of light headedness r/t low BP.  Some of medications were stopped for this reason.   At 04/26/24 OV MD started pt on spironolactone  12.5 mg PO every day.  Advised pt may need more time for medication to get in system.  Especially with recent symptomatic low BP.  Pt reports is nervous d/t family history of stroke.   Advised will send to MD and nurse to f/u.

## 2024-05-03 NOTE — Telephone Encounter (Signed)
 Called pt advised of MD response:  There is really only 1 blood pressure in there that I am concerned about. Lets have him keep blood pressures 3 times a day for another 10 days. No change in therapy.   Pt expresses understanding no further questions at this time.

## 2024-05-06 ENCOUNTER — Encounter (HOSPITAL_BASED_OUTPATIENT_CLINIC_OR_DEPARTMENT_OTHER): Payer: Self-pay | Admitting: Pharmacist Clinician (PhC)/ Clinical Pharmacy Specialist

## 2024-05-07 ENCOUNTER — Other Ambulatory Visit (HOSPITAL_COMMUNITY): Payer: Self-pay

## 2024-05-08 DIAGNOSIS — M21611 Bunion of right foot: Secondary | ICD-10-CM | POA: Diagnosis not present

## 2024-05-08 DIAGNOSIS — L84 Corns and callosities: Secondary | ICD-10-CM | POA: Diagnosis not present

## 2024-05-08 DIAGNOSIS — E1122 Type 2 diabetes mellitus with diabetic chronic kidney disease: Secondary | ICD-10-CM | POA: Diagnosis not present

## 2024-05-10 ENCOUNTER — Other Ambulatory Visit (HOSPITAL_BASED_OUTPATIENT_CLINIC_OR_DEPARTMENT_OTHER): Payer: Self-pay

## 2024-05-10 ENCOUNTER — Other Ambulatory Visit (HOSPITAL_COMMUNITY): Payer: Self-pay

## 2024-05-10 ENCOUNTER — Other Ambulatory Visit: Payer: Self-pay

## 2024-05-13 ENCOUNTER — Telehealth: Payer: Self-pay | Admitting: Cardiology

## 2024-05-13 ENCOUNTER — Other Ambulatory Visit (HOSPITAL_BASED_OUTPATIENT_CLINIC_OR_DEPARTMENT_OTHER): Payer: Self-pay

## 2024-05-13 ENCOUNTER — Other Ambulatory Visit: Payer: Self-pay

## 2024-05-13 ENCOUNTER — Other Ambulatory Visit (HOSPITAL_COMMUNITY): Payer: Self-pay

## 2024-05-13 MED ORDER — PREDNISONE 5 MG (21) PO TBPK
ORAL_TABLET | ORAL | 1 refills | Status: AC
  Filled 2024-05-13 (×2): qty 21, 6d supply, fill #0
  Filled 2024-05-24: qty 21, 6d supply, fill #1

## 2024-05-13 NOTE — Telephone Encounter (Signed)
 Patient dropped off his BP log today.    I will place in Hochrein's mailbox shortly.    Thank you.

## 2024-05-14 ENCOUNTER — Encounter: Payer: Self-pay | Admitting: Cardiology

## 2024-05-16 ENCOUNTER — Other Ambulatory Visit (HOSPITAL_COMMUNITY): Payer: Self-pay

## 2024-05-16 ENCOUNTER — Encounter: Payer: Self-pay | Admitting: Family Medicine

## 2024-05-16 ENCOUNTER — Other Ambulatory Visit: Payer: Self-pay

## 2024-05-16 MED ORDER — LOSARTAN POTASSIUM 100 MG PO TABS
100.0000 mg | ORAL_TABLET | Freq: Every day | ORAL | 1 refills | Status: DC
  Filled 2024-05-16: qty 90, 90d supply, fill #0

## 2024-05-16 NOTE — Telephone Encounter (Signed)
 Pt called in stating he hasn't heard back about BP. Informed him mychart message was sent this morning. I read him the message and stated he was on Norvasc  previously his PCP took him off in June. Please advise.

## 2024-05-16 NOTE — Telephone Encounter (Signed)
 See MyChart message from 9/16

## 2024-05-20 ENCOUNTER — Other Ambulatory Visit (HOSPITAL_COMMUNITY): Payer: Self-pay

## 2024-05-20 ENCOUNTER — Telehealth: Payer: Self-pay | Admitting: Cardiology

## 2024-05-20 NOTE — Telephone Encounter (Signed)
 Patient reports BP has been elevated over the past several days. He reports the following readings: 161/93, 157/93, 154/80, 141/81, and 141/91. He is taking lostartan 100 mg once daily and spironolactone  12.5 mg once daily. He would like to know if he can increase spironolactone  to 25 mg.  He is scheduled for an appointment with an APP on 10/3 to discuss blood pressures.

## 2024-05-20 NOTE — Telephone Encounter (Signed)
 Pt c/o medication issue:  1. Name of Medication:   spironolactone  (ALDACTONE ) 25 MG tablet    2. How are you currently taking this medication (dosage and times per day)?   3. Are you having a reaction (difficulty breathing--STAT)?   4. What is your medication issue?  Patient would like to know if he can be switched to a whole tablet based on BP readings: 173/90 this morning 150-160's yesterday

## 2024-05-21 NOTE — Progress Notes (Deleted)
 Cardiology Office Note:    Date:  05/21/2024   ID:  Dushawn Pusey, DOB [birth year], MRN [id number]  PCP:  Watt Harlene BROCKS, MD  Cardiologist:  Lynwood Schilling, MD { Click to update primary MD,subspecialty MD or APP then REFRESH:1}    Referring MD: Copland, Harlene BROCKS, MD   Chief Complaint: follow-up of hypertension  History of Present Illness:    Curtis Tyler is a 83 y.o. 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 Schilling and presents today for follow-up of hypertension.   Patient has a history of CAD and ultimately underwent CABG x4 with LIMA-LAD, SVG-PDA, and sequential OM1 and OM2 in 2017. Post-op course was unremarkable. He has a history of bradycardia. 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 Repeat monitor in 04/2023 showed Wenckebach and one short episode of possible complete heart block that occurred during sleeping hours. However, he was asymptomatic with this and has not required pacing.   Over the last couple of months, his BP has been running high and multiple medication changes were made by our office and PCP. He was last seen by Dr. Schilling in 03/2024 and BP was still elevated. He reported that he felt like Amlodipine  made him foggy/ dizzy and he did not tolerate HCTZ either. He was only on Losartan  at that time. He was started on Spironolactone . He was also advised to stop Neurontin  to see if that helped his dizziness.   Patient presents today for follow-up. ***  Hypertension BP *** - Continue Losartan  100mg  daily and Spironolactone  12.5mg  daily.   CAD S/p CABG x4 in 2017.  - No chest pain.  - Continue Aspirin  81mg  daily and Pravastatin  40mg  daily.    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 Repeat monitor in 04/2023 showed Wenckebach and one short episode of possible complete heart block that occurred during sleeping hours. - *** - Asymptomatic. - Avoid AV nodal agents.   Mild Carotid Stenosis Carotid dopplers in 06/2021 showed 1-39% stenosis of bilateral ICAs, >50% stenosis of bilateral ECAs, and a stenotic right subclavian artery.  - Continue Aspirin  and Pravastatin .  - No additional routine surveillance felt to be needed.   Hyperlipidemia Lipid panel in 11/2023: Total Cholesterol 109, Triglycerides 132, HDL 65, LDL 17. LDL goal <70.  - Continue Pravastatin  40mg  daily.  - Labs followed by PCP.   EKGs/Labs/Other Studies Reviewed:    The following studies were reviewed:  Left Cardiac Catheterization 02/12/2016: Ost LM lesion, 60% stenosed. LM lesion, 50% stenosed. Calcified Prox LAD to Mid LAD lesion, 90% stenosed. Calcified - napkin-ring like lesion between D1 & D2. Prox Cx lesion, 95% stenosed. Calcified lesion at the takeoff of OM1 Prox RCA to Dist RCA lesion, 60% stenosed. Dist RCA lesion, 100% stenosed. Known CTO with bridging collaterals and left-to-right collaterals filling the distal RCA system The left ventricular systolic function is normal. Normal LVEDP  Plan: CABG _______________  Monitor 2020: NSR,  Mobitz Type 1 No sustained pauses Rare ventricular ectopy.   2:1 block transient _______________  Carotid Dopplers  07/02/2021: Summary: - Right Carotid: Velocities in the right ICA are consistent with a 1-39% stenosis. The ECA appears >50% stenosed.  - Left Carotid: Velocities in the left ICA are consistent with a 1-39% stenosis, high-end of range based on PSV. Non-hemodynamically significant plaque  <50% noted in the CCA. The ECA appears >50% stenosed.  - Vertebrals:  Right vertebral artery demonstrates atypical antegrade flow. Left vertebral artery demonstrates antegrade flow.  - Subclavians: Right subclavian artery was stenotic. Normal flow hemodynamics were seen in the left subclavian artery.  _______________  ABIs/ TBIs 03/18/2022: Summary:  Right: Resting right ankle-brachial index is within normal range. No  evidence of significant right lower extremity arterial disease. The right  toe-brachial index is abnormal.   Left: Resting left ankle-brachial index is within normal range. No  evidence of significant left lower extremity arterial disease. The left  toe-brachial index is normal.  _______________  Monitor 05/27/2023 to 05/30/2023: Normal sinus rhythm First degree AV block First degree Type two block  Brief run of possible third degree AV block during the sleeping hours.     EKG:  EKG not ordered today.   Recent Labs: 04/03/2024: ALT 15; BUN 21; Creatinine, Ser 1.08; Hemoglobin 16.6; Platelets 168.0; Potassium 4.1; Sodium 138; TSH 2.23  Recent Lipid Panel    Component Value Date/Time   CHOL 109 12/05/2023 0843   CHOL 116 09/27/2023 1008   TRIG 132.0 12/05/2023 0843   HDL 65.50 12/05/2023 0843   HDL 72 09/27/2023 1008   CHOLHDL 2 12/05/2023 0843   VLDL 26.4 12/05/2023 0843   LDLCALC 17 12/05/2023 0843   LDLCALC 26 09/27/2023 1008   LDLDIRECT 91.0 11/23/2022 0801    Physical Exam:    Vital Signs: There were no vitals taken for this visit.    Wt Readings from Last 3 Encounters:  04/26/24 163 lb (73.9 kg)  04/08/24 162 lb (73.5 kg)  02/16/24 168 lb (76.2 kg)     General: 83 y.o. male in no acute distress. HEENT: Normocephalic and atraumatic. Sclera clear.  Neck: Supple. No carotid bruits. No JVD. Heart: *** RRR. Distinct S1 and S2. No murmurs, gallops, or rubs.  Lungs: No increased work of breathing. Clear to ausculation bilaterally. No wheezes, rhonchi, or  rales.  Abdomen: Soft, non-distended, and non-tender to palpation.  Extremities: No lower extremity edema.  Radial and distal pedal pulses 2+ and equal bilaterally. Skin: Warm and dry. Neuro: No focal deficits. Psych: Normal affect. Responds appropriately.   Assessment:    No diagnosis found.  Plan:     Disposition: Follow up in ***   Signed, Aline FORBES Jadine DEVONNA  05/21/2024 8:47 PM    [facility]

## 2024-05-24 ENCOUNTER — Other Ambulatory Visit: Payer: Self-pay | Admitting: Family Medicine

## 2024-05-24 DIAGNOSIS — M79604 Pain in right leg: Secondary | ICD-10-CM

## 2024-05-27 ENCOUNTER — Other Ambulatory Visit (HOSPITAL_COMMUNITY): Payer: Self-pay

## 2024-05-27 ENCOUNTER — Other Ambulatory Visit: Payer: Self-pay

## 2024-05-27 MED ORDER — GABAPENTIN 300 MG PO CAPS
ORAL_CAPSULE | ORAL | 1 refills | Status: DC
  Filled 2024-05-27: qty 360, 90d supply, fill #0

## 2024-05-28 ENCOUNTER — Other Ambulatory Visit (HOSPITAL_COMMUNITY): Payer: Self-pay

## 2024-05-30 ENCOUNTER — Other Ambulatory Visit (HOSPITAL_COMMUNITY): Payer: Self-pay

## 2024-05-30 ENCOUNTER — Other Ambulatory Visit: Payer: Self-pay

## 2024-05-30 NOTE — Progress Notes (Unsigned)
 Cardiology Office Note:    Date:  05/30/2024   ID:  Curtis Tyler, DOB 1941-06-21, MRN [id number]  PCP:  Curtis Harlene BROCKS, MD  Cardiologist:  Curtis Schilling, MD {  Referring MD: Curtis Harlene BROCKS, MD   Chief Complaint: follow-up of hypertension  History of Present Illness:    Curtis Tyler is a 83 y.o. 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 Schilling and presents today for follow-up of hypertension.   Patient has a history of CAD and ultimately underwent CABG x4 with LIMA-LAD, SVG-PDA, and sequential OM1 and OM2 in 2017. Post-op course was unremarkable. He has a history of bradycardia. 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 Repeat monitor in 04/2023 showed Wenckebach and one short episode of possible complete heart block that occurred during sleeping hours. However, he was asymptomatic with this and has not required pacing.   Over the last couple of months, his BP has been running high and multiple medication changes were made by our office and PCP. He was last seen by Dr. Schilling in 03/2024 and BP was still elevated. He reported that he felt like Amlodipine  made him foggy/ dizzy and he did not tolerate HCTZ either. He was only on Losartan  at that time. He was started on Spironolactone . He was also advised to stop Neurontin  to see if that helped his dizziness.   Patient presents today for follow-up. ***  Hypertension BP *** - Continue Losartan  100mg  daily and Spironolactone  12.5mg  daily.   CAD S/p CABG x4 in 2017.  - No chest pain.  - Continue Aspirin  81mg  daily and Pravastatin  40mg  daily.   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 Repeat monitor in 04/2023 showed Wenckebach and one short episode of possible complete heart block that occurred during sleeping hours. - *** - Asymptomatic. - Avoid AV nodal agents.   Mild Carotid Stenosis Carotid dopplers in 06/2021 showed 1-39% stenosis of bilateral ICAs, >50% stenosis of bilateral ECAs, and a stenotic right subclavian artery.  - Continue Aspirin  and Pravastatin .  - No additional routine surveillance felt to be needed.   Hyperlipidemia Lipid panel in 11/2023: Total Cholesterol 109, Triglycerides 132, HDL 65, LDL 17. LDL goal <70.  - Continue Pravastatin  40mg  daily.  - Labs followed by PCP.   EKGs/Labs/Other Studies Reviewed:    The following studies were reviewed:  Left Cardiac Catheterization 02/12/2016: Ost LM lesion, 60% stenosed. LM lesion, 50% stenosed. Calcified Prox LAD to Mid LAD lesion, 90% stenosed. Calcified - napkin-ring like lesion between D1 & D2. Prox Cx lesion, 95% stenosed. Calcified lesion at the takeoff of OM1 Prox RCA to Dist RCA lesion, 60% stenosed. Dist RCA lesion, 100% stenosed. Known CTO with bridging collaterals and left-to-right collaterals filling the distal RCA system The left ventricular systolic function is normal. Normal LVEDP  Plan: CABG _______________  Monitor 2020: NSR,  Mobitz Type 1 No sustained pauses Rare ventricular ectopy.   2:1 block transient _______________  Carotid Dopplers 07/02/2021: Summary: - Right Carotid: Velocities in the right ICA are consistent  with a 1-39% stenosis. The ECA appears >50% stenosed.  - Left Carotid: Velocities in the left ICA are consistent with a 1-39% stenosis, high-end of range based on PSV. Non-hemodynamically significant plaque <50% noted in the CCA. The ECA appears >50% stenosed.  - Vertebrals:  Right vertebral  artery demonstrates atypical antegrade flow. Left vertebral artery demonstrates antegrade flow.  - Subclavians: Right subclavian artery was stenotic. Normal flow hemodynamics were seen in the left subclavian artery.  _______________  ABIs/ TBIs 03/18/2022: Summary:  Right: Resting right ankle-brachial index is within normal range. No  evidence of significant right lower extremity arterial disease. The right  toe-brachial index is abnormal.   Left: Resting left ankle-brachial index is within normal range. No  evidence of significant left lower extremity arterial disease. The left  toe-brachial index is normal.  _______________  Monitor 05/27/2023 to 05/30/2023: Normal sinus rhythm First degree AV block First degree Type two block  Brief run of possible third degree AV block during the sleeping hours.     EKG:  EKG not ordered today.   Recent Labs: 04/03/2024: ALT 15; BUN 21; Creatinine, Ser 1.08; Hemoglobin 16.6; Platelets 168.0; Potassium 4.1; Sodium 138; TSH 2.23  Recent Lipid Panel    Component Value Date/Time   CHOL 109 12/05/2023 0843   CHOL 116 09/27/2023 1008   TRIG 132.0 12/05/2023 0843   HDL 65.50 12/05/2023 0843   HDL 72 09/27/2023 1008   CHOLHDL 2 12/05/2023 0843   VLDL 26.4 12/05/2023 0843   LDLCALC 17 12/05/2023 0843   LDLCALC 26 09/27/2023 1008   LDLDIRECT 91.0 11/23/2022 0801    Physical Exam:    Vital Signs: There were no vitals taken for this visit.    Wt Readings from Last 3 Encounters:  04/26/24 163 lb (73.9 kg)  04/08/24 162 lb (73.5 kg)  02/16/24 168 lb (76.2 kg)     General: 83 y.o. male in no acute distress. HEENT: Normocephalic and atraumatic. Sclera clear.  Neck: Supple. No carotid bruits. No JVD. Heart: *** RRR. Distinct S1 and S2. No murmurs, gallops, or rubs.  Lungs: No increased work of breathing. Clear to ausculation bilaterally. No wheezes, rhonchi, or rales.  Abdomen: Soft, non-distended, and non-tender to palpation.  Extremities: No  lower extremity edema.  Radial and distal pedal pulses 2+ and equal bilaterally. Skin: Warm and dry. Neuro: No focal deficits. Psych: Normal affect. Responds appropriately.   Assessment:    No diagnosis found.  Plan:     Disposition: Follow up in ***   Signed, Orren LOISE Fabry, PA-C  05/30/2024 12:22 PM    [facility]

## 2024-05-31 ENCOUNTER — Ambulatory Visit: Attending: Physician Assistant | Admitting: Physician Assistant

## 2024-05-31 ENCOUNTER — Ambulatory Visit: Admitting: Student

## 2024-05-31 ENCOUNTER — Encounter: Payer: Self-pay | Admitting: Physician Assistant

## 2024-05-31 ENCOUNTER — Other Ambulatory Visit (HOSPITAL_COMMUNITY): Payer: Self-pay

## 2024-05-31 ENCOUNTER — Other Ambulatory Visit: Payer: Self-pay

## 2024-05-31 VITALS — BP 178/76 | HR 60 | Ht 62.0 in | Wt 160.0 lb

## 2024-05-31 DIAGNOSIS — I6529 Occlusion and stenosis of unspecified carotid artery: Secondary | ICD-10-CM

## 2024-05-31 DIAGNOSIS — I2510 Atherosclerotic heart disease of native coronary artery without angina pectoris: Secondary | ICD-10-CM

## 2024-05-31 DIAGNOSIS — E785 Hyperlipidemia, unspecified: Secondary | ICD-10-CM

## 2024-05-31 DIAGNOSIS — R001 Bradycardia, unspecified: Secondary | ICD-10-CM

## 2024-05-31 DIAGNOSIS — I10 Essential (primary) hypertension: Secondary | ICD-10-CM

## 2024-05-31 MED ORDER — LOSARTAN POTASSIUM 100 MG PO TABS: 100.0000 mg | ORAL_TABLET | Freq: Every day | ORAL | 3 refills | Status: DC

## 2024-05-31 MED ORDER — BLOOD PRESSURE CUFF MISC
1.0000 [IU] | Freq: Three times a day (TID) | 0 refills | Status: DC
  Filled 2024-05-31: qty 1, fill #0

## 2024-05-31 MED ORDER — OMRON 3 SERIES BP MONITOR DEVI
1.0000 [IU] | Freq: Three times a day (TID) | 0 refills | Status: AC
  Filled 2024-05-31: qty 1, 30d supply, fill #0

## 2024-05-31 MED ORDER — HYDRALAZINE HCL 10 MG PO TABS
10.0000 mg | ORAL_TABLET | Freq: Three times a day (TID) | ORAL | 1 refills | Status: DC | PRN
  Filled 2024-05-31: qty 90, 30d supply, fill #0

## 2024-05-31 NOTE — Patient Instructions (Addendum)
 Medication Instructions:   START  TAKING : HYDRALAZINE  10 MG THREE TIMES A DAY  AS NEEDED   START TAKING : LOSARTAN   AT NIGHT   MAKE SURE YOU CHECK YOUR  BLOOD PRESSURE  1 TO 2 TIMES   A WEEK   *If you need a refill on your cardiac medications before your next appointment, please call your pharmacy*   Lab Work: NONE ORDERED  TODAY    If you have labs (blood work) drawn today and your tests are completely normal, you will receive your results only by: MyChart Message (if you have MyChart) OR A paper copy in the mail If you have any lab test that is abnormal or we need to change your treatment, we will call you to review the results.    Testing/Procedures: Your physician has requested that you have a renal artery duplex. During this test, an ultrasound is used to evaluate blood flow to the kidneys. Allow one hour for this exam. Do not eat after midnight the day before and avoid carbonated beverages. Take your medications as you usually do.    Follow-Up:   At [facility], you and your health needs are our priority.  As part of our continuing mission to provide you with exceptional heart care, our providers are all part of one team.  This team includes your primary Cardiologist (physician) and Advanced Practice Providers or APPs (Physician Assistants and Nurse Practitioners) who all work together to provide you with the care you need, when you need it.   Your next appointment:  2 -3  month(s)   Provider:   Lynwood Schilling, MD or Orren Fabry, PA-C          We recommend signing up for the patient portal called MyChart.  Sign up information is provided on this After Visit Summary.  MyChart is used to connect with patients for Virtual Visits (Telemedicine).  Patients are able to view lab/test results, encounter notes, upcoming appointments, etc.  Non-urgent messages can be sent to your provider as well.   To learn more about what you can do with MyChart, go to  [email or web address].   Other Instructions   Low-Sodium Eating Plan Salt (sodium) helps you keep a healthy balance of fluids in your body. Too much sodium can raise your blood pressure. It can also cause fluid and waste to be held in your body. Your health care provider or dietitian may recommend a low-sodium eating plan if you have high blood pressure (hypertension), kidney disease, liver disease, or heart failure. Eating less sodium can help lower your blood pressure and reduce swelling. It can also protect your heart, liver, and kidneys. What are tips for following this plan? Reading food labels  Check food labels for the amount of sodium per serving. If you eat more than one serving, you must multiply the listed amount by the number of servings. Choose foods with less than 140 milligrams (mg) of sodium per serving. Avoid foods with 300 mg of sodium or more per serving. Always check how much sodium is in a product, even if the label says unsalted or no salt added. Shopping  Buy products labeled as low-sodium or no salt added. Buy fresh foods. Avoid canned foods and pre-made or frozen meals. Avoid canned, cured, or processed meats. Buy breads that have less than 80 mg of sodium per slice. Cooking  Eat more home-cooked food. Try to eat less restaurant, buffet, and fast food. Try not to add salt when  you cook. Use salt-free seasonings or herbs instead of table salt or sea salt. Check with your provider or pharmacist before using salt substitutes. Cook with plant-based oils, such as canola, sunflower, or olive oil. Meal planning When eating at a restaurant, ask if your food can be made with less salt or no salt. Avoid dishes labeled as brined, pickled, cured, or smoked. Avoid dishes made with soy sauce, miso, or teriyaki sauce. Avoid foods that have monosodium glutamate (MSG) in them. MSG may be added to some restaurant food, sauces, soups, bouillon, and canned foods. Make  meals that can be grilled, baked, poached, roasted, or steamed. These are often made with less sodium. General information Try to limit your sodium intake to 1,500-2,300 mg each day, or the amount told by your provider. What foods should I eat? Fruits Fresh, frozen, or canned fruit. Fruit juice. Vegetables Fresh or frozen vegetables. No salt added canned vegetables. No salt added tomato sauce and paste. Low-sodium or reduced-sodium tomato and vegetable juice. Grains Low-sodium cereals, such as oats, puffed wheat and rice, and shredded wheat. Low-sodium crackers. Unsalted rice. Unsalted pasta. Low-sodium bread. Whole grain breads and whole grain pasta. Meats and other proteins Fresh or frozen meat, poultry, seafood, and fish. These should have no added salt. Low-sodium canned tuna and salmon. Unsalted nuts. Dried peas, beans, and lentils without added salt. Unsalted canned beans. Eggs. Unsalted nut butters. Dairy Milk. Soy milk. Cheese that is naturally low in sodium, such as ricotta cheese, fresh mozzarella, or Swiss cheese. Low-sodium or reduced-sodium cheese. Cream cheese. Yogurt. Seasonings and condiments Fresh and dried herbs and spices. Salt-free seasonings. Low-sodium mustard and ketchup. Sodium-free salad dressing. Sodium-free light mayonnaise. Fresh or refrigerated horseradish. Lemon juice. Vinegar. Other foods Homemade, reduced-sodium, or low-sodium soups. Unsalted popcorn and pretzels. Low-salt or salt-free chips. The items listed above may not be all the foods and drinks you can have. Talk to a dietitian to learn more. What foods should I avoid? Vegetables Sauerkraut, pickled vegetables, and relishes. Olives. [place] fries. Onion rings. Regular canned vegetables, except low-sodium or reduced-sodium items. Regular canned tomato sauce and paste. Regular tomato and vegetable juice. Frozen vegetables in sauces. Grains Instant hot cereals. Bread stuffing, pancake, and biscuit mixes.  Croutons. Seasoned rice or pasta mixes. Noodle soup cups. Boxed or frozen macaroni and cheese. Regular salted crackers. Self-rising flour. Meats and other proteins Meat or fish that is salted, canned, smoked, spiced, or pickled. Precooked or cured meat, such as sausages or meat loaves. [place]. Ham. Pepperoni. Hot dogs. Corned beef. Chipped beef. Salt pork. Jerky. Pickled herring, anchovies, and sardines. Regular canned tuna. Salted nuts. Dairy Processed cheese and cheese spreads. Hard cheeses. Cheese curds. Blue cheese. Feta cheese. String cheese. Regular cottage cheese. Buttermilk. Canned milk. Fats and oils Salted butter. Regular margarine. Ghee. Bacon fat. Seasonings and condiments Onion salt, garlic salt, seasoned salt, table salt, and sea salt. Canned and packaged gravies. Worcestershire sauce. Tartar sauce. Barbecue sauce. Teriyaki sauce. Soy sauce, including reduced-sodium soy sauce. Steak sauce. Fish sauce. Oyster sauce. Cocktail sauce. Horseradish that you find on the shelf. Regular ketchup and mustard. Meat flavorings and tenderizers. Bouillon cubes. Hot sauce. Pre-made or packaged marinades. Pre-made or packaged taco seasonings. Relishes. Regular salad dressings. Salsa. Other foods Salted popcorn and pretzels. Corn chips and puffs. Potato and tortilla chips. Canned or dried soups. Pizza. Frozen entrees and pot pies. The items listed above may not be all the foods and drinks you should avoid. Talk to a dietitian to learn  more. This information is not intended to replace advice given to you by your health care provider. Make sure you discuss any questions you have with your health care provider. Document Revised: 09/01/2022 Document Reviewed: 09/01/2022 Elsevier Patient Education  2024 [employer].

## 2024-06-06 ENCOUNTER — Other Ambulatory Visit: Payer: Self-pay | Admitting: Cardiology

## 2024-06-06 ENCOUNTER — Other Ambulatory Visit: Payer: Self-pay | Admitting: Family Medicine

## 2024-06-06 ENCOUNTER — Other Ambulatory Visit: Payer: Self-pay

## 2024-06-06 ENCOUNTER — Other Ambulatory Visit (HOSPITAL_COMMUNITY): Payer: Self-pay

## 2024-06-06 MED ORDER — REPATHA SURECLICK 140 MG/ML ~~LOC~~ SOAJ
140.0000 mg | SUBCUTANEOUS | 3 refills | Status: AC
  Filled 2024-06-06: qty 6, 84d supply, fill #0
  Filled 2024-09-09 – 2024-09-17 (×2): qty 6, 84d supply, fill #1

## 2024-06-06 MED ORDER — TRAZODONE HCL 50 MG PO TABS
25.0000 mg | ORAL_TABLET | Freq: Every evening | ORAL | 1 refills | Status: AC | PRN
  Filled 2024-06-06: qty 90, 90d supply, fill #0
  Filled 2024-09-05: qty 90, 90d supply, fill #1

## 2024-06-07 ENCOUNTER — Emergency Department (HOSPITAL_BASED_OUTPATIENT_CLINIC_OR_DEPARTMENT_OTHER)

## 2024-06-07 ENCOUNTER — Telehealth: Payer: Self-pay | Admitting: Cardiology

## 2024-06-07 ENCOUNTER — Emergency Department (HOSPITAL_BASED_OUTPATIENT_CLINIC_OR_DEPARTMENT_OTHER): Admission: EM | Admit: 2024-06-07 | Discharge: 2024-06-07 | Disposition: A | Discharge status: Home / Self Care

## 2024-06-07 ENCOUNTER — Other Ambulatory Visit: Payer: Self-pay

## 2024-06-07 ENCOUNTER — Encounter (HOSPITAL_BASED_OUTPATIENT_CLINIC_OR_DEPARTMENT_OTHER): Payer: Self-pay

## 2024-06-07 DIAGNOSIS — R079 Chest pain, unspecified: Secondary | ICD-10-CM | POA: Diagnosis not present

## 2024-06-07 DIAGNOSIS — Z7982 Long term (current) use of aspirin: Secondary | ICD-10-CM | POA: Diagnosis not present

## 2024-06-07 DIAGNOSIS — I700 Atherosclerosis of aorta: Secondary | ICD-10-CM | POA: Diagnosis not present

## 2024-06-07 DIAGNOSIS — R519 Headache, unspecified: Secondary | ICD-10-CM | POA: Insufficient documentation

## 2024-06-07 DIAGNOSIS — Z79899 Other long term (current) drug therapy: Secondary | ICD-10-CM | POA: Diagnosis not present

## 2024-06-07 DIAGNOSIS — I10 Essential (primary) hypertension: Secondary | ICD-10-CM | POA: Insufficient documentation

## 2024-06-07 DIAGNOSIS — R030 Elevated blood-pressure reading, without diagnosis of hypertension: Secondary | ICD-10-CM | POA: Diagnosis present

## 2024-06-07 LAB — CBC
HCT: 50.1 % (ref 39.0–52.0)
Hemoglobin: 16.9 g/dL (ref 13.0–17.0)
MCH: 31.1 pg (ref 26.0–34.0)
MCHC: 33.7 g/dL (ref 30.0–36.0)
MCV: 92.1 fL (ref 80.0–100.0)
Platelets: 159 K/uL (ref 150–400)
RBC: 5.44 MIL/uL (ref 4.22–5.81)
RDW: 12.4 % (ref 11.5–15.5)
WBC: 6.8 K/uL (ref 4.0–10.5)
nRBC: 0 % (ref 0.0–0.2)

## 2024-06-07 LAB — BASIC METABOLIC PANEL WITH GFR
Anion gap: 11 (ref 5–15)
BUN: 20 mg/dL (ref 8–23)
CO2: 27 mmol/L (ref 22–32)
Calcium: 9.3 mg/dL (ref 8.9–10.3)
Chloride: 100 mmol/L (ref 98–111)
Creatinine, Ser: 1 mg/dL (ref 0.61–1.24)
GFR, Estimated: >60 mL/min (ref >60)
Glucose, Bld: 104 mg/dL — ABNORMAL HIGH (ref 70–99)
Potassium: 4.3 mmol/L (ref 3.5–5.1)
Sodium: 137 mmol/L (ref 135–145)

## 2024-06-07 LAB — TROPONIN T, HIGH SENSITIVITY: Troponin T High Sensitivity: <15 ng/L (ref 0–19)

## 2024-06-07 NOTE — ED Notes (Signed)
 Dc instructions given, pt verbalized understanding. Out of ED with steady gait, not in visible distress.

## 2024-06-07 NOTE — Telephone Encounter (Signed)
 Spoke with pt regarding his blood pressure. Pt stated his blood pressure has been erratic for the last couple days. Pt stated he had one day where he took his medications as prescribed including 10 mg of hydralazine and his blood pressures were 120s/80s. The next day the pt stated he again took his medications as prescribed and his blood pressures were very high even though he had taken 10 mg of hydralazine 3 times that day. Pt stated he took his blood pressure today 2 hours after taking his medications and it was 213/92. Pt took his blood pressure while on the phone with me and it was 226/92. Pt denied any symptoms other than a warm neck. Pt was advised to go to the ED. Pt was upset at this suggestion. Pt was again advised to go to the ED. Pt verbalized understanding. All questions if any were answered.

## 2024-06-07 NOTE — ED Triage Notes (Signed)
 Reports systolic BP was 200+ at home since this morning.  Denies headache, dizziness, chest pain, SHOB, visual changes  Reports issues w BP management since June

## 2024-06-07 NOTE — ED Provider Notes (Signed)
 [place] [facility] Provider Note   CSN: [id number] Arrival date & time: 06/07/24  1214     Patient presents with: Hypertension   Curtis Tyler is a 83 y.o. male.   83 year old male with past medical history of hypertension presents the emergency department today with elevated blood pressure.  The patient states that he checked his blood pressure at home today and it was over 200 systolic.  The patient states that he had a mild headache with this.  Denies any associated chest pain.  He called his cardiologist office and was told to come to the ER for further evaluation.  Patient states that his symptoms have resolved now.  He states that he is on losartan  and was recently given hydralazine to take as needed.  He did take this this morning and when it remained elevated he came to the ER for further evaluation.   Hypertension       Prior to Admission medications   Medication Sig Start Date End Date Taking? Authorizing Provider  acetaminophen  (TYLENOL ) 650 MG CR tablet Take 650 mg by mouth every 8 (eight) hours as needed for pain.    [provider]  aspirin  81 MG tablet Take 81 mg by mouth daily.    [provider]  Blood Pressure Monitoring (BLOOD PRESSURE CUFF) MISC 1 Units by Does not apply route in the morning, at noon, and at bedtime. 05/31/24   Lucien Orren SAILOR, PA-C  Blood Pressure Monitoring (OMRON 3 SERIES BP MONITOR) DEVI Use to measure blood pressure in the morning, at noon, and at bedtime. 05/31/24   Lucien Orren SAILOR, PA-C  Coenzyme Q10 100 MG TABS Take 100 mg by mouth daily.     [provider]  dicyclomine  (BENTYL ) 10 MG capsule Take 1 capsule (10 mg total) by mouth every morning. May take an additional 3 times daily as needed. 11/27/23   Zehr, Jessica D, PA-C  Evolocumab  (REPATHA  SURECLICK) 140 MG/ML SOAJ Inject 140 mg into the skin every 14 (fourteen) days. 06/06/24   Lavona Agent, MD  fish oil-omega-3 fatty  acids 1000 MG capsule Take 1,400 mg by mouth daily.    [provider]  gabapentin  (NEURONTIN ) 300 MG capsule Take 1 capsule (300 mg total) by mouth in the morning AND 1 capsule (300 mg total) daily at 12 noon as needed AND 2 capsules (600 mg total) at bedtime. Patient taking differently: 2 by mouth at bedtime   05/27/24   Copland, Jessica C, MD  hydrALAZINE (APRESOLINE) 10 MG tablet Take 1 tablet (10 mg total) by mouth 3 (three) times daily as needed. 05/31/24   Conte, Tessa N, PA-C  latanoprost  (XALATAN ) 0.005 % ophthalmic solution Place 1 drop into both eyes nightly. 06/22/23     losartan  (COZAAR ) 100 MG tablet Take 1 tablet (100 mg total) by mouth at bedtime. 05/31/24   Lucien Orren SAILOR, PA-C  methocarbamol  (ROBAXIN ) 500 MG tablet Take 1 tablet (500 mg total) by mouth 2 (two) times daily as needed for low back pain. 04/05/24     mirabegron  ER (MYRBETRIQ ) 50 MG TB24 tablet Take 1 tablet (50 mg total) by mouth daily. 08/17/23     pravastatin  (PRAVACHOL ) 40 MG tablet Take 1 tablet (40 mg total) by mouth daily. 02/26/24   Copland, Harlene BROCKS, MD  predniSONE  (STERAPRED UNI-PAK 21 TAB) 5 MG (21) TBPK tablet Take as directed on package for 6 days 05/13/24     spironolactone  (ALDACTONE ) 25 MG  tablet Take 0.5 tablets (12.5 mg total) by mouth daily. 04/26/24   Lavona Agent, MD  traZODone  (DESYREL ) 50 MG tablet Take 0.5-1 tablets (25-50 mg total) by mouth at bedtime as needed for sleep. 06/06/24   Copland, Jessica C, MD  VITAMIN E PO Take by mouth.    [provider]    Allergies: Patient has no known allergies.    Review of Systems  All other systems reviewed and are negative.   Updated Vital Signs BP (!) 152/78   Pulse 66   Temp 97.8 F (36.6 C)   Resp 18   SpO2 100%   Physical Exam Vitals and nursing note reviewed.   Gen: NAD Eyes: PERRL, EOMI HEENT: no oropharyngeal swelling Neck: trachea midline Resp: clear to auscultation bilaterally Card: RRR, no murmurs, rubs, or  gallops Abd: nontender, nondistended Extremities: no calf tenderness, no edema Vascular: 2+ radial pulses bilaterally, 2+ DP pulses bilaterally Neuro: No focal deficits Skin: no rashes Psyc: acting appropriately   (all labs ordered are listed, but only abnormal results are displayed) Labs Reviewed  BASIC METABOLIC PANEL WITH GFR - Abnormal; Notable for the following components:      Result Value   Glucose, Bld 104 (*)    All other components within normal limits  CBC  TROPONIN T, HIGH SENSITIVITY  TROPONIN T, HIGH SENSITIVITY    EKG: None  Radiology: CT Head Wo Contrast Result Date: 06/07/2024 EXAM: CT HEAD WITHOUT CONTRAST 06/07/2024 02:00:00 PM TECHNIQUE: CT of the head was performed without the administration of intravenous contrast. Automated exposure control, iterative reconstruction, and/or weight based adjustment of the mA/kV was utilized to reduce the radiation dose to as low as reasonably achievable. COMPARISON: None available. CLINICAL HISTORY: Headache, increasing frequency or severity. Images from the original note were not included. FINDINGS: BRAIN AND VENTRICLES: No acute hemorrhage. No evidence of acute infarct. No hydrocephalus. No extra-axial collection. No mass effect or midline shift. ORBITS: No acute abnormality. SINUSES: No acute abnormality. SOFT TISSUES AND SKULL: No acute soft tissue abnormality. No skull fracture. IMPRESSION: 1. No acute intracranial abnormality. Electronically signed by: Gilmore Molt MD 06/07/2024 02:40 PM EDT RP Workstation: [id number]   DG Chest Port 1 View Result Date: 06/07/2024 EXAM: 1 VIEW(S) XRAY OF THE CHEST 06/07/2024 01:48:00 PM COMPARISON: None available. CLINICAL HISTORY: Cp. Reports systolic BP was 200+ at home since this morning. Denies headache, dizziness, chest pain, SHOB, visual changes. Reports issues w BP management since June. FINDINGS: LUNGS AND PLEURA: No focal pulmonary opacity. No pulmonary edema. No pleural  effusion. No pneumothorax. HEART AND MEDIASTINUM: Postoperative changes in mediastinum. Calcified aorta. BONES AND SOFT TISSUES: Median sternotomy wires noted. No acute osseous abnormality. IMPRESSION: 1. Postoperative changes in the mediastinum with median sternotomy wires 2. Calcified aorta, suggestive of atherosclerosis 3. No acute cardiopulmonary abnormality Electronically signed by: Dayne Hassell MD 06/07/2024 02:03 PM EDT RP Workstation: [id number]     Procedures   Medications Ordered in the ED - No data to display                                  Medical Decision Making 83 year old male with past medical history of hypertension presenting to the emergency department today with elevated blood pressure readings.  I will further evaluate the patient here with an EKG and troponin to eval for endorgan dysfunction as well as a BMP and chest x-ray to eval for pulmonary edema.  Will obtain a head CT for further evaluation for intracranial hemorrhage.  I will reevaluate for ultimate disposition.  The patient's work appears reassuring.  EKG is nonischemic and troponin is negative.  The patient remains well-appearing here.  I think that he is stable for discharge with return precautions.  Amount and/or Complexity of Data Reviewed Labs: ordered. Radiology: ordered.        Final diagnoses:  Hypertension, unspecified type    ED Discharge Orders     None          Ula Prentice SAUNDERS, MD 06/07/24 1535

## 2024-06-07 NOTE — Telephone Encounter (Signed)
 Pt c/o BP issue: STAT if pt c/o blurred vision, one-sided weakness or slurred speech.  STAT if BP is GREATER than 180/120 TODAY.  STAT if BP is LESS than 90/60 and SYMPTOMATIC TODAY  1. What is your BP concern?   Patient is concerned his BP has been erratic  2. Have you taken any BP medication today?  Yes  3. What are your last 5 BP readings?  213/92 - today 175/91 - today when first woke up 120/54 - yesterday at 10:30 am 121/66 - yesterday at 2:30 pm  4. Are you having any other symptoms (ex. Dizziness, headache, blurred vision, passed out)?   No   Patient is concerned his BP is erratic and wants advice on next steps.

## 2024-06-07 NOTE — ED Notes (Signed)
 Our Bp 152 78  rt arm ours   His BP cuff 161 89   Rt arm

## 2024-06-07 NOTE — Discharge Instructions (Addendum)
 Your workup today was reassuring.  You may take the medications prescribed by cardiology.  If your blood pressure is significantly elevated again it is okay to take 20 mg of the hydralazine as needed.  Please call their office Monday to schedule a follow-up appointment.  Return to the ER for worsening symptoms.

## 2024-06-11 ENCOUNTER — Other Ambulatory Visit (HOSPITAL_COMMUNITY): Payer: Self-pay

## 2024-06-12 ENCOUNTER — Encounter: Payer: Self-pay | Admitting: Physician Assistant

## 2024-06-13 ENCOUNTER — Other Ambulatory Visit (HOSPITAL_COMMUNITY): Payer: Self-pay

## 2024-06-14 DIAGNOSIS — M7061 Trochanteric bursitis, right hip: Secondary | ICD-10-CM | POA: Diagnosis not present

## 2024-06-14 DIAGNOSIS — M7062 Trochanteric bursitis, left hip: Secondary | ICD-10-CM | POA: Diagnosis not present

## 2024-06-17 ENCOUNTER — Telehealth: Payer: Self-pay | Admitting: Physician Assistant

## 2024-06-17 ENCOUNTER — Ambulatory Visit (HOSPITAL_COMMUNITY)

## 2024-06-17 ENCOUNTER — Other Ambulatory Visit (HOSPITAL_BASED_OUTPATIENT_CLINIC_OR_DEPARTMENT_OTHER): Payer: Self-pay

## 2024-06-17 ENCOUNTER — Other Ambulatory Visit (HOSPITAL_COMMUNITY): Payer: Self-pay

## 2024-06-17 ENCOUNTER — Other Ambulatory Visit: Payer: Self-pay

## 2024-06-17 MED ORDER — VALSARTAN 320 MG PO TABS
320.0000 mg | ORAL_TABLET | Freq: Every day | ORAL | 3 refills | Status: DC
  Filled 2024-06-17 (×3): qty 90, 90d supply, fill #0

## 2024-06-17 NOTE — Telephone Encounter (Signed)
 Patient is aware of recommendations from [facility]. Prescription has been sent in.

## 2024-06-17 NOTE — Telephone Encounter (Signed)
 Pt c/o BP issue: STAT if pt c/o blurred vision, one-sided weakness or slurred speech.  STAT if BP is GREATER than 180/120 TODAY.  STAT if BP is LESS than 90/60 and SYMPTOMATIC TODAY  1. What is your BP concern? Hypertension   2. Have you taken any BP medication today? Yes   3. What are your last 5 BP readings?155/78  4. Are you having any other symptoms (ex. Dizziness, headache, blurred vision, passed out)?   Pt states T. Lucien advised him to callback if his bp continued to run high. Pt requesting a call back about bp medication management. Please advise.

## 2024-06-17 NOTE — Telephone Encounter (Signed)
 Spoke with the patient who states that he went to the ER recently with BP of 224/92. He states that the ER provider advised him to start taking hydralazine three times per day (previously prescribed as needed). Patient reports that this has not helped. He reports blood pressure yesterday of 197/91. Today BP was 155/78. He reports that his systolic is consistently staying anywhere from 150-170. He denies any symptoms but is concerned about these continued elevated readings and his risk of stroke. He confirms that he is taking losartan  100 mg as well. Will send to [facility] for recommendations.

## 2024-06-18 ENCOUNTER — Other Ambulatory Visit (HOSPITAL_COMMUNITY): Payer: Self-pay

## 2024-06-19 ENCOUNTER — Ambulatory Visit: Payer: Self-pay | Admitting: Physician Assistant

## 2024-06-19 ENCOUNTER — Ambulatory Visit (HOSPITAL_COMMUNITY)
Admission: RE | Admit: 2024-06-19 | Discharge: 2024-06-19 | Disposition: A | Discharge status: Home / Self Care | Source: Ambulatory Visit | Attending: Physician Assistant | Admitting: Physician Assistant

## 2024-06-19 DIAGNOSIS — I10 Essential (primary) hypertension: Secondary | ICD-10-CM | POA: Insufficient documentation

## 2024-06-20 DIAGNOSIS — L84 Corns and callosities: Secondary | ICD-10-CM | POA: Diagnosis not present

## 2024-06-20 DIAGNOSIS — M79671 Pain in right foot: Secondary | ICD-10-CM | POA: Diagnosis not present

## 2024-06-24 NOTE — Progress Notes (Unsigned)
 Patient ID: Curtis Tyler                 DOB: [birth year]                      MRN: [id number]      HPI: Curtis Tyler is a 83 y.o. male referred by Dr. Watt to HTN clinic. 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 Initial advice to increase fluid intake provided only temporary relief. Multiple medication adjustments were made over time:  Amlodipine  was discontinued due to hypotension. Losartan  was reduced to 50 mg, and hydrochlorothiazide  to 12.5 mg. Eventually, all medications were stopped except losartan , which was later increased back to 100 mg, and spironolactone  was added at a half dose.  Despite these changes, the patient continues to experience significant BP variability, with elevated readings in the morning and lower readings between 9 AM and 3 PM. He brought in his home BP monitor and log, and the device was validated in clinic. Notably, three consecutive BP readings taken in-office showed a 30-point variation in systolic pressure, highlighting the labile nature of his BP. The patient expressed frustration over conflicting guidance regarding hydralazine--specifically whether it should be taken daily or only when BP exceeds 160 mmHg, as advised by the ED versus our team. He gets flushing feeling around neck and ears when his BP is elevated. His home BP varies. When it is low he gets dizzy.   Current HTN meds: valsartan 320 mg daily in the evening and he only takes hydralazine 10 mg when his BP >150 or 160   Previously tried: Amlodipine  made him foggy/ dizzy and he did not tolerate HCTZ either  BP goal: <130/80   Family History: father and paternal GF - stroke   Social History:   Alcohol 1 glass of wine  Smoking - quite smoking 45 years ago  Diet: low salt home cooked meals   Exercise: none  Resistance exercise 2-3 times per week, stays active unable to walk long distance due to arthritis    Home BP readings: varies a lot 118-163/59-89    Wt Readings from Last 3 Encounters:  05/31/24 160 lb (72.6 kg)  04/26/24 163 lb (73.9 kg)  04/08/24 162 lb (73.5 kg)   BP Readings from Last 3 Encounters:  06/25/24 120/68  06/07/24 (!) 152/78  05/31/24 (!) 178/76   Pulse Readings from Last 3 Encounters:  06/25/24 67  06/07/24 66  05/31/24 60    Renal function: CrCl cannot be calculated (Unknown ideal weight.).  Past Medical History:  Diagnosis Date   Arthritis    BPH (benign prostatic hyperplasia)    Bunion    Callus    Carotid artery occlusion    Coronary artery disease    left main 60% stenosis. The LAD had a proximal 60-70% stenosis. There was mid 40% stenosis. Diagonal is moderate size with 40% stenosis. The circumflex system included a large OM with 60% ostial stenosis the RCA was occluded before the PDA. The EF was 65%.  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 when happened    Obesity    Radiculopathy    Shortness of breath dyspnea    with exertion -     Current Outpatient Medications on File Prior to Visit  Medication Sig Dispense Refill   acetaminophen  (TYLENOL ) 650 MG CR tablet Take 650 mg by mouth every 8 (eight) hours as needed for pain.     aspirin  81 MG tablet Take 81 mg by mouth daily.     Blood Pressure Monitoring (BLOOD PRESSURE CUFF) MISC 1 Units by Does not apply route in the morning, at noon, and at bedtime. 1 each 0   Blood Pressure Monitoring (OMRON 3 SERIES BP MONITOR) DEVI Use to measure blood pressure in the morning, at noon, and at bedtime. 1 each 0   Coenzyme Q10 100 MG TABS  Take 100 mg by mouth daily.      dicyclomine  (BENTYL ) 10 MG capsule Take 1 capsule (10 mg total) by mouth every morning. May take an additional 3 times daily as needed. 50 capsule 3   Evolocumab  (REPATHA  SURECLICK) 140 MG/ML SOAJ Inject 140 mg into the skin every 14 (fourteen) days. 6 mL 3   fish oil-omega-3 fatty acids 1000 MG capsule Take 1,400 mg by mouth daily.     gabapentin  (NEURONTIN ) 300 MG capsule Take 1 capsule (300 mg total) by mouth in the morning AND 1 capsule (300 mg total) daily at 12 noon as needed AND 2 capsules (600 mg total) at bedtime. (Patient taking differently: 2 by mouth at bedtime  ) 360 capsule 1   hydrALAZINE (APRESOLINE) 10 MG tablet Take 1 tablet (10 mg total) by mouth 3 (three) times daily as needed. 90 tablet 1   latanoprost  (XALATAN ) 0.005 % ophthalmic solution Place 1 drop into both eyes nightly. 7.5 mL 3   methocarbamol  (ROBAXIN ) 500 MG tablet Take 1 tablet (500 mg total) by mouth 2 (two) times daily as needed for low back pain. 30 tablet 0   mirabegron  ER (MYRBETRIQ ) 50 MG TB24 tablet Take 1 tablet (50 mg total) by mouth daily. 90 tablet 3   pravastatin  (PRAVACHOL ) 40 MG tablet Take 1 tablet (40 mg total) by mouth daily. 90 tablet 3   predniSONE  (STERAPRED UNI-PAK 21 TAB) 5 MG (21) TBPK tablet Take as directed on package for 6 days 21 tablet 1   spironolactone  (ALDACTONE ) 25 MG tablet Take 0.5 tablets (12.5 mg total) by mouth daily. 90 tablet 3   traZODone  (DESYREL ) 50 MG tablet Take 0.5-1 tablets (25-50 mg total) by mouth at bedtime as needed for sleep. 90 tablet 1   VITAMIN E PO Take by mouth.     No current facility-administered medications on file prior to visit.    No Known Allergies  Blood pressure 120/68, pulse 67.   Assessment/Plan:  1. Hypertension -  Problem  Essential Hypertension   Essential hypertension Assessment: 1st reading in office 152/73, 2nd reading 133/69 and 3rd reading 120/68 taken just a min apart and standing BP 107/61. Home  BP varies a lot too ~ 118-163/59-89; home BP monitor validated today reads accurately  When BP is low he experience dizziness  Denies SOB, palpitation, chest pain, headaches,or swelling Reiterated the importance of regular exercise and low salt diet   Plan:  Given valsartan shorter half-life suggests that BID dosing may provide smoother therapeutic coverage, especially in those with labile BP advised to take valsartan 160 mg twice daily  Continue taking  hydralazine only when SBP >160 or DBP> 90 or BP >160/90  Patient to keep record of BP readings with heart rate and report to us  at the next visit Patient to see PharmD in 4 weeks for follow up  Follow up lab(s): none       Thank you  Robbi Blanch, Pharm.D [facility]. [facility] [address], [place], [place] [address] Phone: [phone]; Fax: [phone]

## 2024-06-25 ENCOUNTER — Other Ambulatory Visit: Payer: Self-pay

## 2024-06-25 ENCOUNTER — Encounter: Payer: Self-pay | Admitting: Pharmacist

## 2024-06-25 ENCOUNTER — Other Ambulatory Visit (HOSPITAL_COMMUNITY): Payer: Self-pay

## 2024-06-25 ENCOUNTER — Ambulatory Visit: Attending: Internal Medicine | Admitting: Pharmacist

## 2024-06-25 VITALS — BP 120/68 | HR 67

## 2024-06-25 DIAGNOSIS — I10 Essential (primary) hypertension: Secondary | ICD-10-CM

## 2024-06-25 MED ORDER — VALSARTAN 160 MG PO TABS
160.0000 mg | ORAL_TABLET | Freq: Two times a day (BID) | ORAL | 3 refills | Status: DC
  Filled 2024-06-25: qty 180, 90d supply, fill #0

## 2024-06-25 NOTE — Assessment & Plan Note (Signed)
 Assessment: 1st reading in office 152/73, 2nd reading 133/69 and 3rd reading 120/68 taken just a min apart and standing BP 107/61. Home BP varies a lot too ~ 118-163/59-89; home BP monitor validated today reads accurately  When BP is low he experience dizziness  Denies SOB, palpitation, chest pain, headaches,or swelling Reiterated the importance of regular exercise and low salt diet   Plan:  Given valsartan shorter half-life suggests that BID dosing may provide smoother therapeutic coverage, especially in those with labile BP advised to take valsartan 160 mg twice daily  Continue taking hydralazine only when SBP >160 or DBP> 90 or BP >160/90  Patient to keep record of BP readings with heart rate and report to us  at the next visit Patient to see PharmD in 4 weeks for follow up  Follow up lab(s): none

## 2024-06-25 NOTE — Patient Instructions (Addendum)
 Changes made by your pharmacist Robbi Blanch, PharmD at today's visit:    Instructions/Changes  (what do you need to do) Your Notes  (what you did and when you did it)  Stop taking valsartan 320 mg daily at night and start taking valsartan 160 mg twice daily ( 10-12 hrs apart)    Take Hydralazine only when BP is elevated    Bring all of your meds, your BP cuff and your record of home blood pressures to your next appointment.    HOW TO TAKE YOUR BLOOD PRESSURE AT HOME  Rest 5 minutes before taking your blood pressure.  Don't smoke or drink caffeinated beverages for at least 30 minutes before. Take your blood pressure before (not after) you eat. Sit comfortably with your back supported and both feet on the floor (don't cross your legs). Elevate your arm to heart level on a table or a desk. Use the proper sized cuff. It should fit smoothly and snugly around your bare upper arm. There should be enough room to slip a fingertip under the cuff. The bottom edge of the cuff should be 1 inch above the crease of the elbow. Ideally, take 3 measurements at one sitting and record the average.  Important lifestyle changes to control high blood pressure  Intervention  Effect on the BP  Lose extra pounds and watch your waistline Weight loss is one of the most effective lifestyle changes for controlling blood pressure. If you're overweight or obese, losing even a small amount of weight can help reduce blood pressure. Blood pressure might go down by about 1 millimeter of mercury (mm Hg) with each kilogram (about 2.2 pounds) of weight lost.  Exercise regularly As a general goal, aim for at least 30 minutes of moderate physical activity every day. Regular physical activity can lower high blood pressure by about 5 to 8 mm Hg.  Eat a healthy diet Eating a diet rich in whole grains, fruits, vegetables, and low-fat dairy products and low in saturated fat and cholesterol. A healthy diet can lower high blood  pressure by up to 11 mm Hg.  Reduce salt (sodium) in your diet Even a small reduction of sodium in the diet can improve heart health and reduce high blood pressure by about 5 to 6 mm Hg.  Limit alcohol One drink equals 12 ounces of beer, 5 ounces of wine, or 1.5 ounces of 80-proof liquor.  Limiting alcohol to less than one drink a day for women or two drinks a day for men can help lower blood pressure by about 4 mm Hg.   If you have any questions or concerns please use My Chart to send questions or call the office at [phone]

## 2024-06-26 DIAGNOSIS — H903 Sensorineural hearing loss, bilateral: Secondary | ICD-10-CM | POA: Diagnosis not present

## 2024-06-27 NOTE — Progress Notes (Signed)
 Keyshun Elpers                                          MRN: [id number]   06/27/2024   The [facility] Quality Team Specialist reviewed this patient medical record for the purposes of chart review for care gap closure. The following were reviewed: abstraction for care gap closure-controlling blood pressure.    [facility] Quality Team

## 2024-07-01 NOTE — Telephone Encounter (Signed)
 Patient wanted to know who he would be assigned to due to Dr. Ines no longer with [facility].  Message Curtis Tyler, she said can schedule with Dr. Margaret.  Patient said will call back to schedule appointment with Dr. Margaret.

## 2024-07-03 ENCOUNTER — Telehealth: Payer: Self-pay | Admitting: Pharmacist

## 2024-07-03 DIAGNOSIS — H0288A Meibomian gland dysfunction right eye, upper and lower eyelids: Secondary | ICD-10-CM | POA: Diagnosis not present

## 2024-07-03 DIAGNOSIS — H401131 Primary open-angle glaucoma, bilateral, mild stage: Secondary | ICD-10-CM | POA: Diagnosis not present

## 2024-07-03 DIAGNOSIS — H0288B Meibomian gland dysfunction left eye, upper and lower eyelids: Secondary | ICD-10-CM | POA: Diagnosis not present

## 2024-07-03 NOTE — Telephone Encounter (Signed)
 Patient called and reports his BP was going too low with dizziness and tiredness from him taking  valsartan 160 mg in the morning and night. So advised to switch back valsartan dose from twice daily to daily - 160 mg twice daily to 320 mg daily as before. Pt was advised to take hydralazine 10 mg prn when SBP >150

## 2024-07-08 DIAGNOSIS — H903 Sensorineural hearing loss, bilateral: Secondary | ICD-10-CM | POA: Diagnosis not present

## 2024-07-16 DIAGNOSIS — B351 Tinea unguium: Secondary | ICD-10-CM | POA: Diagnosis not present

## 2024-07-16 DIAGNOSIS — M21622 Bunionette of left foot: Secondary | ICD-10-CM | POA: Diagnosis not present

## 2024-07-16 DIAGNOSIS — M2041 Other hammer toe(s) (acquired), right foot: Secondary | ICD-10-CM | POA: Diagnosis not present

## 2024-07-16 DIAGNOSIS — M2042 Other hammer toe(s) (acquired), left foot: Secondary | ICD-10-CM | POA: Diagnosis not present

## 2024-07-16 DIAGNOSIS — M19071 Primary osteoarthritis, right ankle and foot: Secondary | ICD-10-CM | POA: Diagnosis not present

## 2024-07-16 DIAGNOSIS — I739 Peripheral vascular disease, unspecified: Secondary | ICD-10-CM | POA: Diagnosis not present

## 2024-07-16 DIAGNOSIS — M21621 Bunionette of right foot: Secondary | ICD-10-CM | POA: Diagnosis not present

## 2024-07-16 DIAGNOSIS — E1151 Type 2 diabetes mellitus with diabetic peripheral angiopathy without gangrene: Secondary | ICD-10-CM | POA: Diagnosis not present

## 2024-07-16 DIAGNOSIS — M2012 Hallux valgus (acquired), left foot: Secondary | ICD-10-CM | POA: Diagnosis not present

## 2024-07-16 DIAGNOSIS — L84 Corns and callosities: Secondary | ICD-10-CM | POA: Diagnosis not present

## 2024-07-16 DIAGNOSIS — M19072 Primary osteoarthritis, left ankle and foot: Secondary | ICD-10-CM | POA: Diagnosis not present

## 2024-07-16 DIAGNOSIS — M2011 Hallux valgus (acquired), right foot: Secondary | ICD-10-CM | POA: Diagnosis not present

## 2024-07-22 ENCOUNTER — Ambulatory Visit: Admitting: Pharmacist

## 2024-07-22 NOTE — Progress Notes (Deleted)
 Patient ID: [name]                 DOB: 06/04/1941                      MRN: [id number]      HPI: Curtis Tyler is a 83 y.o. male referred by Dr. Watt to HTN clinic. 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 Initial advice to increase fluid intake provided only temporary relief. Multiple medication adjustments were made over time:  Amlodipine  was discontinued due to hypotension. Losartan  was reduced to 50 mg, and hydrochlorothiazide  to 12.5 mg. Eventually, all medications were stopped except losartan , which was later increased back to 100 mg, and spironolactone  was added at a half dose.  Despite these changes, the patient continues to experience significant BP variability, with elevated readings in the morning and lower readings between 9 AM and 3 PM. He brought in his home BP monitor and log, and the device was validated in clinic. Notably, three consecutive BP readings taken in-office showed a 30-point variation in systolic pressure, highlighting the labile nature of his BP. The patient expressed frustration over conflicting guidance regarding hydralazine --specifically whether it should be taken daily or only when BP exceeds 160 mmHg, as advised by the ED versus our team. He gets flushing feeling around neck and ears when his BP is elevated. His home BP varies. When it is low he gets dizzy.   Current HTN meds: valsartan  320 mg daily in the evening and he only takes hydralazine  10 mg when his BP >150 or 160   Previously tried: Amlodipine  made him foggy/ dizzy and he did not tolerate HCTZ either  BP goal: <130/80   Family History: father and paternal GF - stroke   Social History:   Alcohol 1 glass of wine  Smoking - quite smoking 45 years ago  Diet: low salt home cooked meals   Exercise: none  Resistance exercise 2-3 times per week, stays active unable to walk long distance due to arthritis    Home BP readings: varies a lot 118-163/59-89    Wt Readings from Last 3 Encounters:  05/31/24 160 lb (72.6 kg)  04/26/24 163 lb (73.9 kg)  04/08/24 162 lb (73.5 kg)   BP Readings from Last 3 Encounters:  06/25/24 120/68  06/07/24 (!) 152/78  05/31/24 (!) 178/76   Pulse Readings from Last 3 Encounters:  06/25/24 67  06/07/24 66  05/31/24 60    Renal function: CrCl cannot be calculated (Patient's most recent lab result is older than the maximum 21 days allowed.).  Past Medical History:  Diagnosis Date   Arthritis    BPH (benign prostatic hyperplasia)    Bunion    Callus    Carotid artery occlusion    Coronary artery disease    left main 60% stenosis. The LAD had a proximal 60-70% stenosis. There was mid 40% stenosis. Diagonal is moderate size with 40% stenosis. The circumflex system included a large OM with 60% ostial stenosis the RCA was occluded before the PDA. The EF was 65%.  2014   Glaucoma    beginning of glaucoma- uses drops    Hyperlipidemia    Hypertension  Leg fracture    right   Myocardial infarct (HCC) 1992   inferior posterior- discovered in 1992-? when happened    Obesity    Radiculopathy    Shortness of breath dyspnea    with exertion -     Current Outpatient Medications on File Prior to Visit  Medication Sig Dispense Refill   acetaminophen  (TYLENOL ) 650 MG CR tablet Take 650 mg by mouth every 8 (eight) hours as needed for pain.     aspirin  81 MG tablet Take 81 mg by mouth daily.     Blood Pressure Monitoring (BLOOD PRESSURE CUFF) MISC 1 Units by Does not apply route in the morning, at noon, and at bedtime. 1 each 0   Blood Pressure Monitoring (OMRON 3 SERIES BP MONITOR) DEVI Use to measure blood pressure in the morning, at noon,  and at bedtime. 1 each 0   Coenzyme Q10 100 MG TABS Take 100 mg by mouth daily.      dicyclomine  (BENTYL ) 10 MG capsule Take 1 capsule (10 mg total) by mouth every morning. May take an additional 3 times daily as needed. 50 capsule 3   Evolocumab  (REPATHA  SURECLICK) 140 MG/ML SOAJ Inject 140 mg into the skin every 14 (fourteen) days. 6 mL 3   fish oil-omega-3 fatty acids 1000 MG capsule Take 1,400 mg by mouth daily.     gabapentin  (NEURONTIN ) 300 MG capsule Take 1 capsule (300 mg total) by mouth in the morning AND 1 capsule (300 mg total) daily at 12 noon as needed AND 2 capsules (600 mg total) at bedtime. (Patient taking differently: 2 by mouth at bedtime  ) 360 capsule 1   hydrALAZINE  (APRESOLINE ) 10 MG tablet Take 1 tablet (10 mg total) by mouth 3 (three) times daily as needed. 90 tablet 1   latanoprost  (XALATAN ) 0.005 % ophthalmic solution Place 1 drop into both eyes nightly. 7.5 mL 3   methocarbamol  (ROBAXIN ) 500 MG tablet Take 1 tablet (500 mg total) by mouth 2 (two) times daily as needed for low back pain. 30 tablet 0   mirabegron  ER (MYRBETRIQ ) 50 MG TB24 tablet Take 1 tablet (50 mg total) by mouth daily. 90 tablet 3   pravastatin  (PRAVACHOL ) 40 MG tablet Take 1 tablet (40 mg total) by mouth daily. 90 tablet 3   predniSONE  (STERAPRED UNI-PAK 21 TAB) 5 MG (21) TBPK tablet Take as directed on package for 6 days 21 tablet 1   spironolactone  (ALDACTONE ) 25 MG tablet Take 0.5 tablets (12.5 mg total) by mouth daily. 90 tablet 3   traZODone  (DESYREL ) 50 MG tablet Take 0.5-1 tablets (25-50 mg total) by mouth at bedtime as needed for sleep. 90 tablet 1   valsartan  (DIOVAN ) 160 MG tablet Take 1 tablet (160 mg total) by mouth 2 (two) times daily. 180 tablet 3   VITAMIN E PO Take by mouth.     No current facility-administered medications on file prior to visit.    No Known Allergies  There were no vitals taken for this visit.   Assessment/Plan:  1. Hypertension -  No problems updated.  No  problem-specific Assessment & Plan notes found for this encounter.      Thank you  Robbi Blanch, Pharm.D [facility] [address] 5[address], [place], [place] [address] Phone: [phone]; Fax: [phone]

## 2024-07-22 NOTE — Telephone Encounter (Signed)
 Pt wife is going to the hospital and pt was unable to make this appt. Please advise

## 2024-07-23 NOTE — Telephone Encounter (Signed)
 Spoke to pt will be seeing him on Nov 26 at 1:30

## 2024-07-24 ENCOUNTER — Ambulatory Visit: Attending: Internal Medicine | Admitting: Pharmacist

## 2024-07-24 ENCOUNTER — Encounter: Payer: Self-pay | Admitting: Pharmacist

## 2024-07-24 DIAGNOSIS — I10 Essential (primary) hypertension: Secondary | ICD-10-CM

## 2024-07-24 MED ORDER — AMLODIPINE BESYLATE 5 MG PO TABS: 2.5000 mg | ORAL_TABLET | Freq: Every day | ORAL | Status: DC

## 2024-07-24 NOTE — Progress Notes (Signed)
 Patient ID: Curtis Tyler                 DOB: 07-10-1941                      MRN: [id number]      HPI: Curtis Tyler is a 83 y.o. male referred by Dr. Watt to HTN clinic. 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 Initial advice to increase fluid intake provided only temporary relief. Multiple medication adjustments were made over time:  Amlodipine  was discontinued due to hypotension. Losartan  was reduced to 50 mg, and hydrochlorothiazide  to 12.5 mg. Eventually, all medications were stopped except losartan , which was later increased back to 100 mg, and spironolactone  was added at a half dose.  Despite these changes, the patient continues to experience significant BP variability, with elevated readings in the morning and lower readings between 9 AM and 3 PM. At the last visit, we noted a 30-point difference in three consecutive blood pressure readings taken in the office. To address this variability, the valsartan  dose was split to 160 mg twice daily; however, the patient did not like this change, as he felt the evening dose was insufficient to lower his morning readings. He brought in his home blood pressure log, which shows significant variability, with readings generally lower in the evening but consistently elevated in the morning, averaging around 150-170/85-90 mmHg. Historically, his blood pressure was well controlled on a regimen of amlodipine , losartan , and hydrochlorothiazide , but after losing approximately 10 lbs and experiencing a hypotensive episode, multiple medication changes were made, and since then, his blood pressure has been difficult to control. The patient reports feeling  "foggy" when his blood pressure is low and experiencing headaches when it is elevated. He describes the current foggy sensation as different from dizziness and believes his blood pressure may be low at present. Dietary review indicates he still adds some salt to his food but avoids processed foods, eats out only once a week, and visits a [employer] twice a month. He recalls that when amlodipine  was added to his previous regimen, it worked well for blood pressure control. Current HTN meds: valsartan  320 mg daily in the evening and he only takes hydralazine  10 mg when his BP > 160   Previously tried: Amlodipine  made him foggy/ dizzy and he did not tolerate HCTZ either  BP goal: <130/80   Family History: father and paternal GF - stroke   Social History:  Alcohol 1 glass of wine  Smoking - quite smoking 45 years ago  Diet: low salt home cooked meals   Exercise: none  Resistance exercise 2-3 times per week, stays active unable to walk long distance due to arthritis    Home BP readings: varies a lot 118-163/59-89    Wt Readings from Last 3 Encounters:  05/31/24 160 lb (72.6 kg)  04/26/24 163 lb (73.9 kg)  04/08/24 162 lb (73.5 kg)   BP Readings from Last 3 Encounters:  06/25/24 120/68  06/07/24 (!) 152/78  05/31/24 (!) 178/76   Pulse Readings from Last 3 Encounters:  06/25/24 67  06/07/24 66  05/31/24 60    Renal function: CrCl cannot be calculated (Patient's most recent lab  result is older than the maximum 21 days allowed.).  Past Medical History:  Diagnosis Date   Arthritis    BPH (benign prostatic hyperplasia)    Bunion    Callus    Carotid artery occlusion    Coronary artery disease    left main 60% stenosis. The LAD had a proximal 60-70% stenosis. There was mid 40% stenosis. Diagonal is moderate size with 40% stenosis. The circumflex system included a large OM with 60% ostial stenosis the RCA was occluded before the PDA. The EF was 65%.  2014   Glaucoma     beginning of glaucoma- uses drops    Hyperlipidemia    Hypertension    Leg fracture    right   Myocardial infarct (HCC) 1992   inferior posterior- discovered in 1992-? when happened    Obesity    Radiculopathy    Shortness of breath dyspnea    with exertion -     Current Outpatient Medications on File Prior to Visit  Medication Sig Dispense Refill   acetaminophen  (TYLENOL ) 650 MG CR tablet Take 650 mg by mouth every 8 (eight) hours as needed for pain.     aspirin  81 MG tablet Take 81 mg by mouth daily.     Blood Pressure Monitoring (BLOOD PRESSURE CUFF) MISC 1 Units by Does not apply route in the morning, at noon, and at bedtime. 1 each 0   Blood Pressure Monitoring (OMRON 3 SERIES BP MONITOR) DEVI Use to measure blood pressure in the morning, at noon, and at bedtime. 1 each 0   Coenzyme Q10 100 MG TABS Take 100 mg by mouth daily.      dicyclomine  (BENTYL ) 10 MG capsule Take 1 capsule (10 mg total) by mouth every morning. May take an additional 3 times daily as needed. 50 capsule 3   Evolocumab  (REPATHA  SURECLICK) 140 MG/ML SOAJ Inject 140 mg into the skin every 14 (fourteen) days. 6 mL 3   fish oil-omega-3 fatty acids 1000 MG capsule Take 1,400 mg by mouth daily.     gabapentin  (NEURONTIN ) 300 MG capsule Take 1 capsule (300 mg total) by mouth in the morning AND 1 capsule (300 mg total) daily at 12 noon as needed AND 2 capsules (600 mg total) at bedtime. (Patient taking differently: 2 by mouth at bedtime  ) 360 capsule 1   hydrALAZINE  (APRESOLINE ) 10 MG tablet Take 1 tablet (10 mg total) by mouth 3 (three) times daily as needed. 90 tablet 1   latanoprost  (XALATAN ) 0.005 % ophthalmic solution Place 1 drop into both eyes nightly. 7.5 mL 3   methocarbamol  (ROBAXIN ) 500 MG tablet Take 1 tablet (500 mg total) by mouth 2 (two) times daily as needed for low back pain. 30 tablet 0   mirabegron  ER (MYRBETRIQ ) 50 MG TB24 tablet Take 1 tablet (50 mg total) by mouth daily. 90 tablet 3   pravastatin   (PRAVACHOL ) 40 MG tablet Take 1 tablet (40 mg total) by mouth daily. 90 tablet 3   predniSONE  (STERAPRED UNI-PAK 21 TAB) 5 MG (21) TBPK tablet Take as directed on package for 6 days 21 tablet 1   spironolactone  (ALDACTONE ) 25 MG tablet Take 0.5 tablets (12.5 mg total) by mouth daily. 90 tablet 3   traZODone  (DESYREL ) 50 MG tablet Take 0.5-1 tablets (25-50 mg total) by mouth at bedtime as needed for sleep. 90 tablet 1   valsartan  (DIOVAN ) 160 MG tablet Take 1 tablet (160 mg total) by mouth 2 (two) times daily. 180  tablet 3   VITAMIN E PO Take by mouth.     No current facility-administered medications on file prior to visit.    No Known Allergies  There were no vitals taken for this visit.   Assessment/Plan:  1. Hypertension -  Problem  Essential Hypertension   Current HTN meds: amlodipine  2.5 mg every morning valsartan  320 mg  in the evening and he only takes hydralazine  10 mg when his BP > 160   Previously tried: Amlodipine  made him foggy/ dizzy and he did not tolerate HCTZ either  BP goal: <130/80      Essential hypertension Assessment: 1st reading in office 144/73, 2nd reading 139/72  Home BP varies a lot - noted morning readings stays mainly above the goal  When BP is low he experience fogginess and when BP is high gets headaches   Denies SOB, palpitation, chest pain, headaches,or swelling Reiterated the importance of regular exercise and low salt diet  Per patient amlodipine  worked well at lowering his BP well in the past   Plan:  Will add small dose amlodipine  2.5 mg every morning to lower morning BP and continue valsartan  320 mg daily in the evening.  Continue taking hydralazine  only when SBP >160 or DBP> 90 or BP >160/90  Patient to keep record of BP readings with heart rate and report to us  at the next visit Patient to see PharmD in 3-4 weeks for follow up  Follow up lab(s): none       Thank you  Robbi Blanch, Pharm.D [facility]. [facility] [address], [place], [place] [address] Phone: [phone]; Fax: [phone]

## 2024-07-24 NOTE — Assessment & Plan Note (Signed)
 Assessment: 1st reading in office 144/73, 2nd reading 139/72  Home BP varies a lot - noted morning readings stays mainly above the goal  When BP is low he experience fogginess and when BP is high gets headaches   Denies SOB, palpitation, chest pain, headaches,or swelling Reiterated the importance of regular exercise and low salt diet  Per patient amlodipine  worked well at lowering his BP well in the past   Plan:  Will add small dose amlodipine  2.5 mg every morning to lower morning BP and continue valsartan  320 mg daily in the evening.  Continue taking hydralazine  only when SBP >160 or DBP> 90 or BP >160/90  Patient to keep record of BP readings with heart rate and report to us  at the next visit Patient to see PharmD in 3-4 weeks for follow up  Follow up lab(s): none

## 2024-07-24 NOTE — Patient Instructions (Signed)
 Changes made by your pharmacist Robbi Blanch, PharmD at today's visit:    Instructions/Changes  (what do you need to do) Your Notes  (what you did and when you did it)  Continue taking valsartan  320 mg in the evening and start taking amlodipine  2.5 mg daily in the morning    Take hydralazine  10 mg only if SBP >160    Lower salt intake     Bring all of your meds, your BP cuff and your record of home blood pressures to your next appointment.    HOW TO TAKE YOUR BLOOD PRESSURE AT HOME  Rest 5 minutes before taking your blood pressure.  Don't smoke or drink caffeinated beverages for at least 30 minutes before. Take your blood pressure before (not after) you eat. Sit comfortably with your back supported and both feet on the floor (don't cross your legs). Elevate your arm to heart level on a table or a desk. Use the proper sized cuff. It should fit smoothly and snugly around your bare upper arm. There should be enough room to slip a fingertip under the cuff. The bottom edge of the cuff should be 1 inch above the crease of the elbow. Ideally, take 3 measurements at one sitting and record the average.  Important lifestyle changes to control high blood pressure  Intervention  Effect on the BP  Lose extra pounds and watch your waistline Weight loss is one of the most effective lifestyle changes for controlling blood pressure. If you're overweight or obese, losing even a small amount of weight can help reduce blood pressure. Blood pressure might go down by about 1 millimeter of mercury (mm Hg) with each kilogram (about 2.2 pounds) of weight lost.  Exercise regularly As a general goal, aim for at least 30 minutes of moderate physical activity every day. Regular physical activity can lower high blood pressure by about 5 to 8 mm Hg.  Eat a healthy diet Eating a diet rich in whole grains, fruits, vegetables, and low-fat dairy products and low in saturated fat and cholesterol. A healthy diet can  lower high blood pressure by up to 11 mm Hg.  Reduce salt (sodium) in your diet Even a small reduction of sodium in the diet can improve heart health and reduce high blood pressure by about 5 to 6 mm Hg.  Limit alcohol One drink equals 12 ounces of beer, 5 ounces of wine, or 1.5 ounces of 80-proof liquor.  Limiting alcohol to less than one drink a day for women or two drinks a day for men can help lower blood pressure by about 4 mm Hg.   If you have any questions or concerns please use My Chart to send questions or call the office at [phone]

## 2024-08-04 NOTE — Progress Notes (Unsigned)
 Cardiology Office Note:   Date:  08/05/2024  ID:  Afshin Chrystal, DOB 1941-04-30, MRN [id number] PCP: Watt Harlene BROCKS, MD  [facility] Providers Cardiologist:  Lynwood Schilling, MD {  History of Present Illness:   Curtis Tyler is a 83 y.o. male  who presents for follow up of CAD.  He had a cardiac cath in 2014 demonstrating left main 60% stenosis. The LAD had a proximal 60-70% stenosis. There was mid 40% stenosis. Diagonal is moderate size with 40% stenosis. The circumflex system included a large OM with 60% ostial stenosis the RCA was occluded before the PDA. The EF was 65%. However, we decided to manage him medically unless he had worsening symptoms. In May 2017 I sent him for a screening POET (Plain Old Exercise Treadmill).  This was abnormal with ST depression and was a high risk study. He then underwent cardiac catheterization where he was found to have a 60% ostial left main, a 90% proximal LAD, 90% proximal circumflex, and a diffusely diseased right coronary with a chronically totally occluded posterior descending.  He was taken the operating room on 02/25/2016 for CABG x 4 by Dr. Kerrin.   He had bradycardia and I stopped his beta blocker.  A Holter demonstrated sinus with Mobitz Type 1.  He did have brief runs of 2:1 block without symptoms.  There were no sustained pauses.  He had a monitor that demonstrated 2-1 AV block.  He had some complete heart block.  However, he had no symptoms.  He saw Dr. Inocencio and he did not require pacing.    Since I last saw him he has been very frustrated with his BP readings and feelings of foggy head.   His most recent BP med adjustment was to reduce his amlodipine  down to 2.5 mg daily.  He takes his ARB in the morning and his amlodipine  at night.  He is not taking any spironolactone  which was still on his list.  He is taking hydralazine  if his blood pressure gets greater than 160.  He thinks it is finally evened out most recently.  He says when it  is high he feels lightheaded or foggy.  He is not having any new chest pressure, neck or arm discomfort.  He is not having any palpitations, presyncope or syncope.  He still trying to be active.  I do note that he went to the emergency room in October with his blood pressure systolic at home being greater than 200.  I reviewed these records for this visit.  He did not have any med adjustments on that day and was not given any medicines as his blood pressure was only 152 when he presented.  A head CT was unremarkable.   ROS: As stated in the HPI and negative for all other systems.  Studies Reviewed:    EKG:     NA  Risk Assessment/Calculations:              Physical Exam:   VS:  BP 126/70 (BP Location: Right Arm, Patient Position: Sitting)   Pulse 70   Ht 5' 2 (1.575 m)   Wt 162 lb 3.2 oz (73.6 kg)   SpO2 96%   BMI 29.67 kg/m    Wt Readings from Last 3 Encounters:  08/05/24 162 lb 3.2 oz (73.6 kg)  05/31/24 160 lb (72.6 kg)  04/26/24 163 lb (73.9 kg)     GEN: Well nourished, well developed in no acute distress NECK: No JVD; No  carotid bruits CARDIAC: RRR, no murmurs, rubs, gallops RESPIRATORY:  Clear to auscultation without rales, wheezing or rhonchi  ABDOMEN: Soft, non-tender, non-distended EXTREMITIES:  No edema; No deformity   ASSESSMENT AND PLAN:   CAD s/p CABG: The patient has no new sypmtoms.  No further cardiovascular testing is indicated.  We will continue with aggressive risk reduction and meds as listed.  HTN:   The blood pressure is labile.  We had a long discussion about this.  We talked about how chasing blood pressures with multiple med changes is more problematic.  We talked about chronic goals of therapy.  I think it is fine for him to take his hydralazine  currently as prescribed and if he is having to use it very frequently then I would need to make a adjustment either to his ARB or amlodipine  though he has been sensitive to med changes.     HYPERLIPIDEMIA:      LDL was 47.  No change in therapy.    CAROTID STENOSIS:   This was mild in 2022.  No change in therapy.    BRADYCARDIA: He is had no symptomatic bradycardia arrhythmias.  I will avoid AV nodal blocking agents or negative chronotropic's at this point.    DIZZINESS:   He has been through multiple medications.  He has not tolerated higher dose amlodipine  necessarily, chlorthalidone, Cardura , hydrochlorothiazide , losartan , metoprolol , higher dose valsartan .    However, not sure that all of these have been med intolerances.  He has had no acute neurologic findings on recent CT.  This is not thought to be related to bradycardia arrhythmia.  I suggested he might want talk with his primary provider and consider ENT referral.       Follow up with me in four months.   Signed, Lynwood Schilling, MD

## 2024-08-05 ENCOUNTER — Ambulatory Visit: Attending: Cardiology | Admitting: Cardiology

## 2024-08-05 ENCOUNTER — Other Ambulatory Visit (HOSPITAL_COMMUNITY): Payer: Self-pay

## 2024-08-05 ENCOUNTER — Encounter: Payer: Self-pay | Admitting: Cardiology

## 2024-08-05 VITALS — BP 126/70 | HR 70 | Ht 62.0 in | Wt 162.2 lb

## 2024-08-05 DIAGNOSIS — I10 Essential (primary) hypertension: Secondary | ICD-10-CM | POA: Diagnosis not present

## 2024-08-05 DIAGNOSIS — I2510 Atherosclerotic heart disease of native coronary artery without angina pectoris: Secondary | ICD-10-CM

## 2024-08-05 DIAGNOSIS — R42 Dizziness and giddiness: Secondary | ICD-10-CM | POA: Diagnosis not present

## 2024-08-05 DIAGNOSIS — R001 Bradycardia, unspecified: Secondary | ICD-10-CM | POA: Diagnosis not present

## 2024-08-05 DIAGNOSIS — E785 Hyperlipidemia, unspecified: Secondary | ICD-10-CM

## 2024-08-05 MED ORDER — HYDRALAZINE HCL 10 MG PO TABS: 10.0000 mg | ORAL_TABLET | Freq: Three times a day (TID) | ORAL | Status: AC | PRN

## 2024-08-05 MED ORDER — VALSARTAN 160 MG PO TABS: 160.0000 mg | ORAL_TABLET | Freq: Every day | ORAL | Status: AC

## 2024-08-05 NOTE — Patient Instructions (Signed)
 Medication Instructions:  Take Hydralazine  every 8 hours as needed for a systolic blood pressure (top number) greater than 160 Take Valsartan  160 once daily *If you need a refill on your cardiac medications before your next appointment, please call your pharmacy*  Lab Work: NONE If you have labs (blood work) drawn today and your tests are completely normal, you will receive your results only by: MyChart Message (if you have MyChart) OR A paper copy in the mail If you have any lab test that is abnormal or we need to change your treatment, we will call you to review the results.  Testing/Procedures: NONE  Follow-Up: At [facility], you and your health needs are our priority.  As part of our continuing mission to provide you with exceptional heart care, our providers are all part of one team.  This team includes your primary Cardiologist (physician) and Advanced Practice Providers or APPs (Physician Assistants and Nurse Practitioners) who all work together to provide you with the care you need, when you need it.  Your next appointment:   4 month(s)  Provider:   Lynwood Schilling, MD    We recommend signing up for the patient portal called MyChart.  Sign up information is provided on this After Visit Summary.  MyChart is used to connect with patients for Virtual Visits (Telemedicine).  Patients are able to view lab/test results, encounter notes, upcoming appointments, etc.  Non-urgent messages can be sent to your provider as well.   To learn more about what you can do with MyChart, go to [email or web address].

## 2024-08-07 ENCOUNTER — Other Ambulatory Visit (HOSPITAL_COMMUNITY): Payer: Self-pay

## 2024-08-07 ENCOUNTER — Encounter: Payer: Self-pay | Admitting: Family Medicine

## 2024-08-07 DIAGNOSIS — R42 Dizziness and giddiness: Secondary | ICD-10-CM

## 2024-08-08 ENCOUNTER — Encounter (INDEPENDENT_AMBULATORY_CARE_PROVIDER_SITE_OTHER): Payer: Self-pay

## 2024-08-14 NOTE — Progress Notes (Unsigned)
 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 male patient of Dr Schilling, in the office today for hypertension evaluation.   He has been followed by Dr. Robbi Blanch for hypertension recently, and has had ongoing issues with labile readings.  At her last visit she noted his home readings were showing elevations in the mornings, as high as 150-170 systolic, but lower in the evenings.  She added amlodipine  2.5 mg each morning.    Today he is in the office for follow up.  He notes that his current problems started in June, after he lost 15-18 pounds.  He was having a few hypotensive episodes and his medications were decreased accordingly.  After a couple of months he noted that pressures were starting to go back up again.  Most recently he was seen by Robbi Blanch PharmD, who added back amlodipine  2.5 mg daily.   He forgot his home readings, but notes are usually in the 140-150 range in the morning and at night, but mid-day can often drop into the 120's.   He does have hydralazine  10 mg for readings > 160 systolic and has only used once in the past month.   Takes am meds around 6-7 am, then BP 2 hours later.  Repeats routine at night with meds at 8 pm and BP at 10 pm  Blood Pressure Goal:  130/80  Current Medications:  valsartan  160 mg at bedtime, hydralazine  for BP > 160/90 (either)  Previously tried: losartan  - switched to valsartan  for better control  Social Hx:      Tobacco: quit 46 years ago  Alcohol: wine daily, occasional mixed drink  Caffeine: 1 coffee  in the morning, second is always decaf  Diet:  mostly home cooked meals, eats out maybe once weekly (Chinese food twice monthly)    Home BP readings: has arma nd wrist cuffs (Omron arm)      Accessory Clinical Findings    Lab Results  Component Value Date   CREATININE 1.00 06/07/2024   BUN 20 06/07/2024   NA 137 06/07/2024   K 4.3 06/07/2024   CL 100 06/07/2024   CO2 27 06/07/2024   Lab Results  Component Value Date   ALT 15 04/03/2024   AST 16 04/03/2024   ALKPHOS 60 04/03/2024   BILITOT 1.3 (H) 04/03/2024   Lab Results  Component Value Date   HGBA1C 6.4 04/03/2024    Home Medications    Current Outpatient Medications  Medication Sig Dispense Refill   amLODipine  (NORVASC ) 5 MG tablet Take 1 tablet (5 mg total) by mouth daily. 90 tablet 3   acetaminophen  (TYLENOL ) 650 MG CR tablet Take 650 mg by mouth every 8 (eight) hours as needed for pain.     aspirin  81 MG tablet Take 81 mg by mouth daily.     Blood Pressure Monitoring (OMRON 3 SERIES BP MONITOR) DEVI Use to measure blood pressure in the morning, at noon, and at bedtime. 1 each 0   Coenzyme Q10  100 MG TABS Take 100 mg by mouth daily.      dicyclomine  (BENTYL ) 10 MG capsule Take 1 capsule (10 mg total) by mouth every morning. May take an additional 3 times daily as needed. 50 capsule 3   Evolocumab  (REPATHA  SURECLICK) 140 MG/ML SOAJ Inject 140 mg into the skin every 14 (fourteen) days. 6 mL 3   fish oil-omega-3 fatty acids 1000 MG capsule Take 1,400 mg by mouth daily.     hydrALAZINE  (APRESOLINE ) 10 MG tablet Take 1 tablet (10 mg total) by mouth every 8 (eight) hours as needed (Take 10 mg every 8 hours as needed for a systolic blood pressure (top number) greater than 160).     latanoprost  (XALATAN ) 0.005 % ophthalmic solution Place 1 drop into both eyes nightly. 7.5 mL 3   methocarbamol  (ROBAXIN ) 500 MG tablet Take 1 tablet (500 mg total) by mouth 2 (two) times daily as needed for low back pain. 30 tablet 0    mirabegron  ER (MYRBETRIQ ) 50 MG TB24 tablet Take 1 tablet (50 mg total) by mouth daily. 90 tablet 3   pravastatin  (PRAVACHOL ) 40 MG tablet Take 1 tablet (40 mg total) by mouth daily. 90 tablet 3   predniSONE  (STERAPRED UNI-PAK 21 TAB) 5 MG (21) TBPK tablet Take as directed on package for 6 days 21 tablet 1   traZODone  (DESYREL ) 50 MG tablet Take 0.5-1 tablets (25-50 mg total) by mouth at bedtime as needed for sleep. 90 tablet 1   valsartan  (DIOVAN ) 160 MG tablet Take 1 tablet (160 mg total) by mouth daily.     VITAMIN E PO Take by mouth.     No current facility-administered medications for this visit.     HYPERTENSION CONTROL Vitals:   08/15/24 0833 08/15/24 0838  BP: (!) 160/74 (!) 156/76    The patient's blood pressure is elevated above target today.  In order to address the patient's elevated BP: Blood pressure will be monitored at home to determine if medication changes need to be made.; A current anti-hypertensive medication was adjusted today.      Assessment & Plan    Essential hypertension Assessment: BP is uncontrolled in office BP 156/76 mmHg;  above the goal (<130/80). Patient endorses some white coat hypertension Tolerates amlodipine  and valsartan  well, without any side effects Denies SOB, palpitation, chest pain, headaches,or swelling Reiterated the importance of regular exercise and low salt diet   Plan:  Increase amlodipine  to 5 mg each morning Continue taking valsartan  160 mg each evening Patient to keep record of BP readings with heart rate and report to us  at the next visit (reviewed that we may need to move valsartan  to higher dose for best BP control.  Don't want to change today as patient going out of town for 3 weeks, including time on a cruise with family) Patient to follow up with me in 1 month  Labs ordered today:  none   Allean Mink PharmD CPP [facility]  [address] [place], [place] [address] [phone]        [1] No Known Allergies

## 2024-08-15 ENCOUNTER — Encounter: Payer: Self-pay | Admitting: Pharmacist Clinician (PhC)/ Clinical Pharmacy Specialist

## 2024-08-15 ENCOUNTER — Telehealth: Payer: Self-pay | Admitting: Neurology

## 2024-08-15 ENCOUNTER — Ambulatory Visit: Admitting: Pharmacist Clinician (PhC)/ Clinical Pharmacy Specialist

## 2024-08-15 ENCOUNTER — Other Ambulatory Visit (HOSPITAL_BASED_OUTPATIENT_CLINIC_OR_DEPARTMENT_OTHER): Payer: Self-pay

## 2024-08-15 VITALS — BP 156/76

## 2024-08-15 DIAGNOSIS — I10 Essential (primary) hypertension: Secondary | ICD-10-CM

## 2024-08-15 MED ORDER — AMLODIPINE BESYLATE 5 MG PO TABS
5.0000 mg | ORAL_TABLET | Freq: Every day | ORAL | 3 refills | Status: DC
  Filled 2024-08-15: qty 90, 90d supply, fill #0

## 2024-08-15 NOTE — Patient Instructions (Signed)
 Follow up appointment: JANUARY 19 AT 11 AM  Take your BP meds as follows:   INCREASE AMLODIPINE  TO 5 MG ONCE DAILY IN THE MORNINGS  CONTINUE WITH VALSARTAN  160 MG ONCE DAILY IN THE EVENINGS   USE HYDRALAZINE  10 MG IF BP IS > 160 SYSTOLIC  Check your blood pressure at home daily (if able) and keep record of the readings.  Your blood pressure goal is < 130/80  To check your pressure at home you will need to:  1. Sit up in a chair, with feet flat on the floor and back supported. Do not cross your ankles or legs. 2. Rest your left arm so that the cuff is about heart level. If the cuff goes on your upper arm,  then just relax the arm on the table, arm of the chair or your lap. If you have a wrist cuff, we  suggest relaxing your wrist against your chest (think of it as Pledging the Flag with the  wrong arm).  3. Place the cuff snugly around your arm, about 1 inch above the crook of your elbow. The  cords should be inside the groove of your elbow.  4. Sit quietly, with the cuff in place, for about 5 minutes. After that 5 minutes press the power  button to start a reading. 5. Do not talk or move while the reading is taking place.  6. Record your readings on a sheet of paper. Although most cuffs have a memory, it is often  easier to see a pattern developing when the numbers are all in front of you.  7. You can repeat the reading after 1-3 minutes if it is recommended  Make sure your bladder is empty and you have not had caffeine or tobacco within the last 30 min  Always bring your blood pressure log with you to your appointments. If you have not brought your monitor in to be double checked for accuracy, please bring it to your next appointment.  You can find a list of quality blood pressure cuffs at [email or web address]  Important lifestyle changes to control high blood pressure  Intervention  Effect on the BP  Lose extra pounds and watch your waistline Weight loss is one of the most effective  lifestyle changes for controlling blood pressure. If you're overweight or obese, losing even a small amount of weight can help reduce blood pressure. Blood pressure might go down by about 1 millimeter of mercury (mm Hg) with each kilogram (about 2.2 pounds) of weight lost.  Exercise regularly As a general goal, aim for at least 30 minutes of moderate physical activity every day. Regular physical activity can lower high blood pressure by about 5 to 8 mm Hg.  Eat a healthy diet Eating a diet rich in whole grains, fruits, vegetables, and low-fat dairy products and low in saturated fat and cholesterol. A healthy diet can lower high blood pressure by up to 11 mm Hg.  Reduce salt (sodium) in your diet Even a small reduction of sodium in the diet can improve heart health and reduce high blood pressure by about 5 to 6 mm Hg.  Limit alcohol One drink equals 12 ounces of beer, 5 ounces of wine, or 1.5 ounces of 80-proof liquor.  Limiting alcohol to less than one drink a day for women or two drinks a day for men can help lower blood pressure by about 4 mm Hg.   If you have any questions or concerns please use My Chart to  send questions or call the office at [phone]

## 2024-08-15 NOTE — Telephone Encounter (Signed)
 Appointment made for a f/u

## 2024-08-15 NOTE — Assessment & Plan Note (Signed)
 Assessment: BP is uncontrolled in office BP 156/76 mmHg;  above the goal (<130/80). Patient endorses some white coat hypertension Tolerates amlodipine  and valsartan  well, without any side effects Denies SOB, palpitation, chest pain, headaches,or swelling Reiterated the importance of regular exercise and low salt diet   Plan:  Increase amlodipine  to 5 mg each morning Continue taking valsartan  160 mg each evening Patient to keep record of BP readings with heart rate and report to us  at the next visit (reviewed that we may need to move valsartan  to higher dose for best BP control.  Don't want to change today as patient going out of town for 3 weeks, including time on a cruise with family) Patient to follow up with me in 1 month  Labs ordered today:  none

## 2024-08-16 ENCOUNTER — Other Ambulatory Visit (HOSPITAL_BASED_OUTPATIENT_CLINIC_OR_DEPARTMENT_OTHER): Payer: Self-pay

## 2024-08-16 ENCOUNTER — Other Ambulatory Visit (HOSPITAL_COMMUNITY): Payer: Self-pay

## 2024-08-16 ENCOUNTER — Other Ambulatory Visit: Payer: Self-pay

## 2024-08-16 MED ORDER — LATANOPROST 0.005 % OP SOLN
1.0000 [drp] | Freq: Every evening | OPHTHALMIC | 3 refills | Status: AC
  Filled 2024-08-16: qty 7.5, 75d supply, fill #0

## 2024-09-05 ENCOUNTER — Other Ambulatory Visit (HOSPITAL_COMMUNITY): Payer: Self-pay

## 2024-09-07 ENCOUNTER — Other Ambulatory Visit (HOSPITAL_COMMUNITY): Payer: Self-pay

## 2024-09-07 MED ORDER — MIRABEGRON ER 50 MG PO TB24
50.0000 mg | ORAL_TABLET | Freq: Every day | ORAL | 3 refills | Status: AC
  Filled 2024-09-07: qty 90, 90d supply, fill #0

## 2024-09-08 ENCOUNTER — Other Ambulatory Visit: Payer: Self-pay

## 2024-09-09 ENCOUNTER — Other Ambulatory Visit: Payer: Self-pay

## 2024-09-09 ENCOUNTER — Telehealth: Payer: Self-pay | Admitting: Cardiology

## 2024-09-09 ENCOUNTER — Other Ambulatory Visit (HOSPITAL_COMMUNITY): Payer: Self-pay

## 2024-09-09 NOTE — Telephone Encounter (Signed)
 Pt c/o medication issue:  1. Name of Medication:   Evolocumab  (REPATHA  SURECLICK) 140 MG/ML SOAJ   2. How are you currently taking this medication (dosage and times per day)?   As prescribed  3. Are you having a reaction (difficulty breathing--STAT)?   4. What is your medication issue?    Patient wants a call back regarding his Repatha  grant.

## 2024-09-10 ENCOUNTER — Other Ambulatory Visit: Payer: Self-pay

## 2024-09-11 ENCOUNTER — Telehealth: Payer: Self-pay

## 2024-09-11 NOTE — Telephone Encounter (Signed)
 Copied from CRM [id number]. Topic: Clinical - Medical Advice >> Sep 11, 2024 11:29 AM Maisie BROCKS wrote: Reason for CRM: pt called and asked if Tammy could give him a call back because he is having issues with his prescription for Myrbetriq . Pt prefers to discuss with pharmacist with further details and chose not to elaborate. Please call and advise.

## 2024-09-13 NOTE — Telephone Encounter (Signed)
 Patient wanted to report a frustrating interaction with the pharmacy. He states he ordered Myrbetriq  refill on 08/09/2024. When he had not received medication by 08/16/2024 he contacted the pharmacy again and was told that they were waiting on an order from the physician. He then was out of town for 17 days. On 09/07/2024 he called the pharmacy and was again told that they were still waiting on physician to supply a new order. Due to this delay patient had to pay $256 for 90 days but could have paid $0 if it had been filled in 2025 because he had met his 2025 out of pocket spend. Patient wishes there had been more communication with him from pharmacy and follow up with the physician for the order. He was not sure who to contact to provide this feedback to he called me. I will try to forward his concerns to community pharmacy managers.   Reviewed his 2026 HTA Plan 1 information - he has $250 deductible  Also has 20% coinsurance for any tier 3 medications. Myrbetriq  is tier 3 (generic Myrbetriq  if non formulary) so cost for 90 days per Medicare.gov will stay at $256 until he meets his $2100 out of pocket for 2026, then cost will decrease to $0.  He also takes Repatha  and it too is tier 3 but he has used a Orthoptist in the past to help with Repatha  cost. He is meeting with cardiology clinical pharmacist next week and will discuss when he will need to re-enroll for [employer].

## 2024-09-15 NOTE — Progress Notes (Unsigned)
 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 male patient of Dr Schilling, in the office today for hypertension evaluation.   He has been followed by Dr. Robbi Blanch for hypertension recently, and has had ongoing issues with labile readings.  At her last visit she noted his home readings were showing elevations in the mornings, as high as 150-170 systolic, but lower in the evenings.  She added amlodipine  2.5 mg each morning.  I saw him in December, with an elevated office pressure of 156/76.  Home readings were around the same by his report.  I increased amlodipine  to 5 mg daily  Today he returns for follow up.  Had a wonderful New Year vacation, went on a cruise with wife and granddaughter.  Tells me that he took the 5 mg amlodipine  for just 2-3 days, but it made him feel cloudy and off balance.  Went back to just 2.5 mg daily.  He brings his home readings and home BP device with him today.    Blood Pressure Goal:  130/80  Current Medications:  valsartan  160 mg at bedtime, hydralazine  for BP > 160/90 (either)  Previously tried: losartan  - switched to valsartan  for better control  Social Hx:      Tobacco: quit 46 years ago  Alcohol: wine daily, occasional mixed drink  Caffeine: 1 coffee in the morning, second is always decaf  Diet:  mostly home cooked meals, eats out maybe once weekly (Chinese food twice monthly)    Home BP readings: has arma nd wrist cuffs (Omron arm)  device reads within 10 points of office reading:  Last 2 weeks show home AM average of  131 systolic and PM average 126 systolic.  Diastolic readings were all WNL  Accessory Clinical Findings    Lab Results  Component Value Date   CREATININE 1.00 06/07/2024   BUN 20 06/07/2024   NA 137 06/07/2024   K 4.3 06/07/2024   CL 100 06/07/2024   CO2 27 06/07/2024   Lab Results  Component Value Date   ALT 15 04/03/2024   AST 16 04/03/2024   ALKPHOS 60 04/03/2024   BILITOT 1.3 (H) 04/03/2024   Lab Results  Component Value Date   HGBA1C 6.4 04/03/2024    Home Medications    Current Outpatient Medications  Medication Sig Dispense Refill   amLODipine  (NORVASC ) 2.5 MG tablet Take 1 tablet (2.5 mg total) by mouth daily. 90 tablet 3   acetaminophen  (TYLENOL ) 650 MG CR tablet Take 650 mg by mouth every 8 (eight) hours as needed for pain.     aspirin  81 MG tablet Take 81 mg by mouth daily.     Blood Pressure Monitoring (OMRON 3 SERIES BP MONITOR) DEVI Use to measure blood pressure in the morning, at noon, and at bedtime. 1 each 0   Coenzyme Q10 100 MG TABS Take 100 mg by mouth daily.      dicyclomine  (BENTYL ) 10 MG capsule Take 1 capsule (10 mg total) by mouth  every morning. May take an additional 3 times daily as needed. 50 capsule 3   Evolocumab  (REPATHA  SURECLICK) 140 MG/ML SOAJ Inject 140 mg into the skin every 14 (fourteen) days. 6 mL 3   fish oil-omega-3 fatty acids 1000 MG capsule Take 1,400 mg by mouth daily.     hydrALAZINE  (APRESOLINE ) 10 MG tablet Take 1 tablet (10 mg total) by mouth every 8 (eight) hours as needed (Take 10 mg every 8 hours as needed for a systolic blood pressure (top number) greater than 160).     latanoprost  (XALATAN ) 0.005 % ophthalmic solution Place 1 drop into both eyes nightly. 7.5 mL 3   methocarbamol  (ROBAXIN ) 500 MG tablet Take 1 tablet (500 mg total) by mouth 2 (two) times daily as needed for low back pain. 30 tablet 0   mirabegron  ER (MYRBETRIQ ) 50 MG TB24 tablet Take 1 tablet (50 mg total) by mouth daily. 90 tablet 3   pravastatin   (PRAVACHOL ) 40 MG tablet Take 1 tablet (40 mg total) by mouth daily. 90 tablet 3   predniSONE  (STERAPRED UNI-PAK 21 TAB) 5 MG (21) TBPK tablet Take as directed on package for 6 days 21 tablet 1   traZODone  (DESYREL ) 50 MG tablet Take 0.5-1 tablets (25-50 mg total) by mouth at bedtime as needed for sleep. 90 tablet 1   valsartan  (DIOVAN ) 160 MG tablet Take 1 tablet (160 mg total) by mouth daily.     VITAMIN E PO Take by mouth.     No current facility-administered medications for this visit.     HYPERTENSION CONTROL Vitals:   09/16/24 1134 09/16/24 1139  BP: (!) 154/78 (!) 160/86    The patient's blood pressure is elevated above target today.  In order to address the patient's elevated BP: Blood pressure will be monitored at home to determine if medication changes need to be made.; The blood pressure is usually elevated in clinic.  Blood pressures monitored at home have been optimal.      Assessment & Plan    Essential hypertension Assessment: BP is uncontrolled in office BP 154/78 mmHg;  above the goal (<130/80). Home device reads within 10 points suggesting some measure of white coat hypertension Tolerates valsartan  and amlodipine  well, without any side effects Denies SOB, palpitation, chest pain, headaches,or swelling Reiterated the importance of regular exercise and low salt diet   Plan:  Continue taking amlodipine  2.5 mg and valsartan  160 mg - both once daily Patient to monitor BP 2-3 times per week and report back with any concerns Patient to follow up with Dr. Lavona in April  Labs ordered today:  none   Allean Mink PharmD CPP [facility]  [address] [place], [place] [address] [phone]       [1] No Known Allergies  "

## 2024-09-16 ENCOUNTER — Other Ambulatory Visit (HOSPITAL_BASED_OUTPATIENT_CLINIC_OR_DEPARTMENT_OTHER): Payer: Self-pay

## 2024-09-16 ENCOUNTER — Telehealth: Payer: Self-pay | Admitting: Pharmacist Clinician (PhC)/ Clinical Pharmacy Specialist

## 2024-09-16 ENCOUNTER — Encounter: Payer: Self-pay | Admitting: Pharmacist Clinician (PhC)/ Clinical Pharmacy Specialist

## 2024-09-16 ENCOUNTER — Ambulatory Visit: Admitting: Diagnostic Neuroimaging

## 2024-09-16 ENCOUNTER — Other Ambulatory Visit (HOSPITAL_COMMUNITY): Payer: Self-pay

## 2024-09-16 ENCOUNTER — Ambulatory Visit: Admitting: Pharmacist Clinician (PhC)/ Clinical Pharmacy Specialist

## 2024-09-16 ENCOUNTER — Other Ambulatory Visit: Payer: Self-pay

## 2024-09-16 ENCOUNTER — Ambulatory Visit: Admitting: Pharmacist

## 2024-09-16 ENCOUNTER — Encounter: Payer: Self-pay | Admitting: Diagnostic Neuroimaging

## 2024-09-16 VITALS — BP 160/86 | HR 68

## 2024-09-16 VITALS — BP 136/78 | HR 69 | Ht 62.0 in | Wt 166.4 lb

## 2024-09-16 DIAGNOSIS — I10 Essential (primary) hypertension: Secondary | ICD-10-CM

## 2024-09-16 DIAGNOSIS — R413 Other amnesia: Secondary | ICD-10-CM

## 2024-09-16 MED ORDER — AMLODIPINE BESYLATE 2.5 MG PO TABS
2.5000 mg | ORAL_TABLET | Freq: Every day | ORAL | 3 refills | Status: AC
  Filled 2024-09-16: qty 90, 90d supply, fill #0

## 2024-09-16 NOTE — Telephone Encounter (Signed)
 Could you please update his PA for Repatha , expired on 12/31.

## 2024-09-16 NOTE — Patient Instructions (Signed)
" °  MILD [facility]ORY IMPAIRMENT ([facility]; no changes in ADLs; MMSE 27/30) - try to stay active physically and get some exercise (at least 15-30 minutes per day) - eat a nutritious diet with lean protein, plants / vegetables, whole grains; avoid ultra-processed foods - increase social activities, brain stimulation, games, puzzles, hobbies, crafts, arts, music; try new activities; keep it fun! - aim for at least 7-8 hours sleep per night (or more) - avoid smoking and alcohol "

## 2024-09-16 NOTE — Patient Instructions (Addendum)
 Follow up appointment:  - with Dr. Lavona in April  Take your BP meds as follows: continue with amlodipine  2.5 mg daily and valsartan  160 mg daily  Check your blood pressure at home 2-3 times per week and keep record of the readings.  Your blood pressure goal is < 130/80  To check your pressure at home you will need to:  1. Sit up in a chair, with feet flat on the floor and back supported. Do not cross your ankles or legs. 2. Rest your left arm so that the cuff is about heart level. If the cuff goes on your upper arm,  then just relax the arm on the table, arm of the chair or your lap. If you have a wrist cuff, we  suggest relaxing your wrist against your chest (think of it as Pledging the Flag with the  wrong arm).  3. Place the cuff snugly around your arm, about 1 inch above the crook of your elbow. The  cords should be inside the groove of your elbow.  4. Sit quietly, with the cuff in place, for about 5 minutes. After that 5 minutes press the power  button to start a reading. 5. Do not talk or move while the reading is taking place.  6. Record your readings on a sheet of paper. Although most cuffs have a memory, it is often  easier to see a pattern developing when the numbers are all in front of you.  7. You can repeat the reading after 1-3 minutes if it is recommended  Make sure your bladder is empty and you have not had caffeine or tobacco within the last 30 min  Always bring your blood pressure log with you to your appointments. If you have not brought your monitor in to be double checked for accuracy, please bring it to your next appointment.  You can find a list of quality blood pressure cuffs at [email or web address].no  Important lifestyle changes to control high blood pressure  Intervention  Effect on the BP  Lose extra pounds and watch your waistline Weight loss is one of the most effective lifestyle changes for controlling blood pressure. If you're overweight or obese, losing  even a small amount of weight can help reduce blood pressure. Blood pressure might go down by about 1 millimeter of mercury (mm Hg) with each kilogram (about 2.2 pounds) of weight lost.  Exercise regularly As a general goal, aim for at least 30 minutes of moderate physical activity every day. Regular physical activity can lower high blood pressure by about 5 to 8 mm Hg.  Eat a healthy diet Eating a diet rich in whole grains, fruits, vegetables, and low-fat dairy products and low in saturated fat and cholesterol. A healthy diet can lower high blood pressure by up to 11 mm Hg.  Reduce salt (sodium) in your diet Even a small reduction of sodium in the diet can improve heart health and reduce high blood pressure by about 5 to 6 mm Hg.  Limit alcohol One drink equals 12 ounces of beer, 5 ounces of wine, or 1.5 ounces of 80-proof liquor.  Limiting alcohol to less than one drink a day for women or two drinks a day for men can help lower blood pressure by about 4 mm Hg.   If you have any questions or concerns please use My Chart to send questions or call the office at [phone]

## 2024-09-16 NOTE — Assessment & Plan Note (Signed)
 Assessment: BP is uncontrolled in office BP 154/78 mmHg;  above the goal (<130/80). Home device reads within 10 points suggesting some measure of white coat hypertension Tolerates valsartan  and amlodipine  well, without any side effects Denies SOB, palpitation, chest pain, headaches,or swelling Reiterated the importance of regular exercise and low salt diet   Plan:  Continue taking amlodipine  2.5 mg and valsartan  160 mg - both once daily Patient to monitor BP 2-3 times per week and report back with any concerns Patient to follow up with Dr. Lavona in April  Labs ordered today:  none

## 2024-09-16 NOTE — Progress Notes (Signed)
 "  [facility]  PATIENT: Curtis Tyler DOB: 1941-05-02  REFERRING CLINICIAN: Copland, Harlene BROCKS, MD HISTORY FROM: patient  REASON FOR VISIT: follow up   HISTORICAL  CHIEF COMPLAINT:  Chief Complaint  Patient presents with   RM 6    Patient is here with wife for short term memory follow-up - Patient was referred to Dr Hayden for formal neurocognitive testing. Patient was last seen by Dr. Ines on 05/22/23 MMSE score was 21    HISTORY OF PRESENT ILLNESS:   UPDATE (09/16/24, VRP): Since last visit, doing well. Symptoms are stable. Mild memory issues since 2024; mainly related to hearing impairment. Now with better hearing aids. No changes in ADLs.    REVIEW OF SYSTEMS: Full 14 system review of systems performed and negative with exception of: as per HPI.  ALLERGIES: Allergies[1]  HOME MEDICATIONS: Outpatient Medications Prior to Visit  Medication Sig Dispense Refill   acetaminophen  (TYLENOL ) 650 MG CR tablet Take 650 mg by mouth every 8 (eight) hours as needed for pain.     amLODipine  (NORVASC ) 2.5 MG tablet Take 1 tablet (2.5 mg total) by mouth daily. 90 tablet 3   aspirin  81 MG tablet Take 81 mg by mouth daily.     Blood Pressure Monitoring (OMRON 3 SERIES BP MONITOR) DEVI Use to measure blood pressure in the morning, at noon, and at bedtime. 1 each 0   Coenzyme Q10 100 MG TABS Take 100 mg by mouth daily.      dicyclomine  (BENTYL ) 10 MG capsule Take 1 capsule (10 mg total) by mouth every morning. May take an additional 3 times daily as needed. 50 capsule 3   Evolocumab  (REPATHA  SURECLICK) 140 MG/ML SOAJ Inject 140 mg into the skin every 14 (fourteen) days. 6 mL 3   fish oil-omega-3 fatty acids 1000 MG capsule Take 1,400 mg by mouth daily.     gabapentin  (NEURONTIN ) 300 MG capsule Oral; Duration: 90 Days     hydrALAZINE  (APRESOLINE ) 10 MG tablet Take 1 tablet (10 mg total) by mouth every 8 (eight) hours as needed (Take 10 mg every 8 hours as needed for a systolic  blood pressure (top number) greater than 160).     latanoprost  (XALATAN ) 0.005 % ophthalmic solution Place 1 drop into both eyes nightly. 7.5 mL 3   methocarbamol  (ROBAXIN ) 500 MG tablet Take 1 tablet (500 mg total) by mouth 2 (two) times daily as needed for low back pain. 30 tablet 0   mirabegron  ER (MYRBETRIQ ) 50 MG TB24 tablet Take 1 tablet (50 mg total) by mouth daily. 90 tablet 3   pravastatin  (PRAVACHOL ) 40 MG tablet Take 1 tablet (40 mg total) by mouth daily. 90 tablet 3   predniSONE  (STERAPRED UNI-PAK 21 TAB) 5 MG (21) TBPK tablet Take as directed on package for 6 days 21 tablet 1   traZODone  (DESYREL ) 50 MG tablet Take 0.5-1 tablets (25-50 mg total) by mouth at bedtime as needed for sleep. 90 tablet 1   valsartan  (DIOVAN ) 160 MG tablet Take 1 tablet (160 mg total) by mouth daily.     VITAMIN E PO Take by mouth.     No facility-administered medications prior to visit.    PAST MEDICAL HISTORY: Past Medical History:  Diagnosis Date   Arthritis    BPH (benign prostatic hyperplasia)    Bunion    Callus    Carotid artery occlusion    Coronary artery disease    left main 60% stenosis. The LAD had a  proximal 60-70% stenosis. There was mid 40% stenosis. Diagonal is moderate size with 40% stenosis. The circumflex system included a large OM with 60% ostial stenosis the RCA was occluded before the PDA. The EF was 65%.  2014   Glaucoma    beginning of glaucoma- uses drops    Hyperlipidemia    Hypertension    Leg fracture    right   Myocardial infarct (HCC) 1992   inferior posterior- discovered in 1992-? when happened    Obesity    Radiculopathy    Shortness of breath dyspnea    with exertion -     PAST SURGICAL HISTORY: Past Surgical History:  Procedure Laterality Date   APPENDECTOMY  1958   CARDIAC CATHETERIZATION  05/2004   SHOWING 100% OCCLUDED DISTAL RIGHT CORONARY WITH  LEFT  TO RIGHT COLLATERALS, AS WELL AS ANTEGRADE FLOW, NORMAL LEFT MAIN, 50% NARROWING IN LEFT CIRCUMFLEX   WITH IRREGULARITIES IN THE LAD   CARDIAC CATHETERIZATION N/A 02/12/2016   Procedure: Left Heart Cath and Coronary Angiography;  Surgeon: Alm LELON Clay, MD;  Location: [facility] INVASIVE CV LAB;  Service: Cardiovascular;  Laterality: N/A;   CARDIOVASCULAR STRESS TEST  02/26/2010   EF 65%, SMALL AREA OF MILD INFARCT AND POSSIBLE PER-INFARCT ISCHEMIA IN THE INFEROBASAL WALL   COLONOSCOPY     CORONARY ANGIOPLASTY     RIGHT CORONARY WERE UNSUCCESSFUL   CORONARY ARTERY BYPASS GRAFT N/A 02/25/2016   Procedure: CORONARY ARTERY BYPASS GRAFTING (CABG) times four using the left internal mammary artery and the right saphenus vein ;  Surgeon: Elspeth JAYSON Millers, MD;  Location: [facility] OR;  Service: Open Heart Surgery;  Laterality: N/A;   HERNIA REPAIR  1960   right hernia repair   INTRAOPERATIVE TRANSESOPHAGEAL ECHOCARDIOGRAM N/A 02/25/2016   Procedure: INTRAOPERATIVE TRANSESOPHAGEAL ECHOCARDIOGRAM;  Surgeon: Elspeth JAYSON Millers, MD;  Location: [facility] OR;  Service: Open Heart Surgery;  Laterality: N/A;   UMBILICAL HERNIA REPAIR     US  ECHOCARDIOGRAPHY  01/03/2007   EF 55-60%    FAMILY HISTORY: Family History  Problem Relation Age of Onset   Congestive Heart Failure Mother    Diabetes Mother    Stroke Father    Sleep apnea Other    Hypertension Neg Hx    Colon cancer Neg Hx    Colon polyps Neg Hx    Esophageal cancer Neg Hx    Rectal cancer Neg Hx    Stomach cancer Neg Hx    Neuropathy Neg Hx    Alzheimer's disease Neg Hx    Dementia Neg Hx    Seizures Neg Hx    Migraines Neg Hx     SOCIAL HISTORY: Social History   Socioeconomic History   Marital status: Married    Spouse name: Rosaline   Number of children: 2   Years of education: HS   Highest education level: 12th grade  Occupational History   Occupation: retired  Tobacco Use   Smoking status: Former    Current packs/day: 0.00    Average packs/day: 1 pack/day for 25.0 years (25.0 ttl pk-yrs)    Types: Cigarettes    Start date: 07/23/1956     Quit date: 07/23/1981    Years since quitting: 43.1   Smokeless tobacco: Never  Vaping Use   Vaping status: Never Used  Substance and Sexual Activity   Alcohol use: Yes    Alcohol/week: 7.0 standard drinks of alcohol    Types: 7 Glasses of wine per week    Comment: glass  of wine a night    Drug use: No   Sexual activity: Yes  Other Topics Concern   Not on file  Social History Narrative   Positive HTN and Stroke among his parents who lived up until their late 80'sPatient lives at home with his spouse.      Caffeine Use: 1- 2 cups daily   Social Drivers of Health   Tobacco Use: Medium Risk (09/16/2024)   Patient History    Smoking Tobacco Use: Former    Smokeless Tobacco Use: Never    Passive Exposure: Not on file  Financial Resource Strain: Low Risk (12/07/2023)   Overall Financial Resource Strain (CARDIA)    Difficulty of Paying Living Expenses: Not hard at all  Food Insecurity: No Food Insecurity (12/07/2023)   Hunger Vital Sign    Worried About Running Out of Food in the Last Year: Never true    Ran Out of Food in the Last Year: Never true  Transportation Needs: No Transportation Needs (12/07/2023)   PRAPARE - Administrator, Civil Service (Medical): No    Lack of Transportation (Non-Medical): No  Physical Activity: Insufficiently Active (12/07/2023)   Exercise Vital Sign    Days of Exercise per Week: 3 days    Minutes of Exercise per Session: 30 min  Stress: No Stress Concern Present (12/07/2023)   [employer] of Occupational Health - Occupational Stress Questionnaire    Feeling of Stress : Not at all  Social Connections: Unknown (12/07/2023)   Social Connection and Isolation Panel    Frequency of Communication with Friends and Family: More than three times a week    Frequency of Social Gatherings with Friends and Family: Three times a week    Attends Religious Services: Patient declined    Active Member of Clubs or Organizations: Yes    Attends Tax Inspector Meetings: More than 4 times per year    Marital Status: Married  Catering Manager Violence: Not At Risk (03/15/2023)   Humiliation, Afraid, Rape, and Kick questionnaire    Fear of Current or Ex-Partner: No    Emotionally Abused: No    Physically Abused: No    Sexually Abused: No  Depression (PHQ2-9): Low Risk (04/08/2024)   Depression (PHQ2-9)    PHQ-2 Score: 0  Alcohol Screen: Low Risk (12/07/2023)   Alcohol Screen    Last Alcohol Screening Score (AUDIT): 0  Housing: Unknown (12/07/2023)   Housing Stability Vital Sign    Unable to Pay for Housing in the Last Year: No    Number of Times Moved in the Last Year: Not on file    Homeless in the Last Year: No  Utilities: Not At Risk (03/15/2023)   AHC Utilities    Threatened with loss of utilities: No  Health Literacy: Adequate Health Literacy (03/15/2023)   B1300 Health Literacy    Frequency of need for help with medical instructions: Never     PHYSICAL EXAM  GENERAL EXAM/CONSTITUTIONAL: Vitals:  Vitals:   09/16/24 1503  BP: 136/78  Pulse: 69  SpO2: 97%  Weight: 166 lb 6.4 oz (75.5 kg)  Height: 5' 2 (1.575 m)   Body mass index is 30.43 kg/m. Wt Readings from Last 3 Encounters:  09/16/24 166 lb 6.4 oz (75.5 kg)  08/05/24 162 lb 3.2 oz (73.6 kg)  05/31/24 160 lb (72.6 kg)   Patient is in no distress; well developed, nourished and groomed; neck is supple  CARDIOVASCULAR: Examination of carotid arteries is normal;  no carotid bruits Regular rate and rhythm, no murmurs Examination of peripheral vascular system by observation and palpation is normal  EYES: Ophthalmoscopic exam of optic discs and posterior segments is normal; no papilledema or hemorrhages No results found.  MUSCULOSKELETAL: Gait, strength, tone, movements noted in Neurologic exam below  NEUROLOGIC: MENTAL STATUS:     09/16/2024    3:15 PM 05/22/2023    9:46 AM  MMSE - Mini Mental State Exam  Orientation to time 5 4  Orientation to Place  5 4  Registration 3 3  Attention/ Calculation 4 1  Recall 1 1  Language- name 2 objects 2 2  Language- repeat 1 1  Language- follow 3 step command 3 3  Language- read & follow direction 1 1  Write a sentence 1 1  Copy design 1 0  Total score 27 21   awake, alert, oriented to person, place and time recent and remote memory intact normal attention and concentration language fluent, comprehension intact, naming intact fund of knowledge appropriate  CRANIAL NERVE:  2nd - no papilledema on fundoscopic exam 2nd, 3rd, 4th, 6th - pupils equal and reactive to light, visual fields full to confrontation, extraocular muscles intact, no nystagmus 5th - facial sensation symmetric 7th - facial strength symmetric 8th - hearing REDUCED 9th - palate elevates symmetrically, uvula midline 11th - shoulder shrug symmetric 12th - tongue protrusion midline  MOTOR:  normal bulk and tone, full strength in the BUE, BLE  SENSORY:  normal and symmetric to light touch, temperature, vibration  COORDINATION:  finger-nose-finger, fine finger movements normal  REFLEXES:  deep tendon reflexes 1+ and symmetric  GAIT/STATION:  narrow based gait     DIAGNOSTIC DATA (LABS, IMAGING, TESTING) - I reviewed patient records, labs, notes, testing and imaging myself where available.  Lab Results  Component Value Date   WBC 6.8 06/07/2024   HGB 16.9 06/07/2024   HCT 50.1 06/07/2024   MCV 92.1 06/07/2024   PLT 159 06/07/2024      Component Value Date/Time   NA 137 06/07/2024 1403   NA 136 08/16/2021 1210   K 4.3 06/07/2024 1403   CL 100 06/07/2024 1403   CO2 27 06/07/2024 1403   GLUCOSE 104 (H) 06/07/2024 1403   BUN 20 06/07/2024 1403   BUN 23 08/16/2021 1210   CREATININE 1.00 06/07/2024 1403   CREATININE 0.86 03/14/2016 1526   CALCIUM  9.3 06/07/2024 1403   PROT 7.4 04/03/2024 0758   PROT 7.4 09/27/2023 1008   ALBUMIN  4.6 04/03/2024 0758   ALBUMIN  4.7 09/27/2023 1008   AST 16 04/03/2024  0758   ALT 15 04/03/2024 0758   ALKPHOS 60 04/03/2024 0758   BILITOT 1.3 (H) 04/03/2024 0758   BILITOT 0.8 09/27/2023 1008   GFRNONAA >60 06/07/2024 1403   GFRAA >60 02/28/2016 0250   Lab Results  Component Value Date   CHOL 109 12/05/2023   HDL 65.50 12/05/2023   LDLCALC 17 12/05/2023   LDLDIRECT 91.0 11/23/2022   TRIG 132.0 12/05/2023   CHOLHDL 2 12/05/2023   Lab Results  Component Value Date   HGBA1C 6.4 04/03/2024   Lab Results  Component Value Date   VITAMINB12 402 04/08/2024   Lab Results  Component Value Date   TSH 2.23 04/03/2024   05/22/23  A -- Beta-amyloid 42/40 Ratio 0.105  Beta-amyloid 42 22.55  Beta-amyloid 40 215.19  T -- p-tau181 0.96  N -- NfL, Plasma 3.47  ATN SUMMARY Comment  Comment:  A- T- N- A normal beta-amyloid 42/40 ratio and normal concentrations of pTau181 and NfL were observed at this time. These results are not consistent with the presence of Alzheimer's- related pathology.   APO E Genotyping Result: E3/E3   02/17/22 MRI brain - Normal brain MRI with no acute intracranial pathology or other finding to explain the patient's symptoms.  06/07/24 CT head  1. No acute intracranial abnormality.     ASSESSMENT AND PLAN  84 y.o. year old male here with:  Dx:  1. Mild memory disturbance     PLAN:  MILD MEMORY IMPAIRMENT (since ~2024; MCI vs normal aging; no changes in ADLs; MMSE 27/30) - try to stay active physically and get some exercise (at least 15-30 minutes per day) - eat a nutritious diet with lean protein, plants / vegetables, whole grains; avoid ultra-processed foods - increase social activities, brain stimulation, games, puzzles, hobbies, crafts, arts, music; try new activities; keep it fun! - aim for at least 7-8 hours sleep per night (or more) - avoid smoking and alcohol  Return for return to PCP, pending if symptoms worsen or fail to improve.    EDUARD FABIENE HANLON, MD 09/16/2024, 3:31  PM Certified in Neurology, Neurophysiology and Neuroimaging  [facility] [address] [place], [place] [address] [phone]     [1] No Known Allergies  "

## 2024-09-17 ENCOUNTER — Telehealth: Payer: Self-pay | Admitting: Pharmacy Technician

## 2024-09-17 ENCOUNTER — Other Ambulatory Visit (HOSPITAL_COMMUNITY): Payer: Self-pay

## 2024-09-17 ENCOUNTER — Other Ambulatory Visit: Payer: Self-pay

## 2024-09-17 NOTE — Telephone Encounter (Signed)
" ° ° °  Pharmacy Patient Advocate Encounter  Received notification from [facility] that Prior Authorization for repatha  has been APPROVED    "

## 2024-09-17 NOTE — Telephone Encounter (Signed)
 PA request has been Approved. New Encounter has been or will be created for follow up. For additional info see Pharmacy Prior Auth telephone encounter from 09/17/24.

## 2024-10-01 ENCOUNTER — Encounter (INDEPENDENT_AMBULATORY_CARE_PROVIDER_SITE_OTHER): Payer: Self-pay

## 2024-10-01 ENCOUNTER — Ambulatory Visit (INDEPENDENT_AMBULATORY_CARE_PROVIDER_SITE_OTHER)

## 2024-10-01 VITALS — BP 132/60 | HR 66 | Wt 166.0 lb

## 2024-10-01 DIAGNOSIS — J342 Deviated nasal septum: Secondary | ICD-10-CM | POA: Diagnosis not present

## 2024-10-01 DIAGNOSIS — H903 Sensorineural hearing loss, bilateral: Secondary | ICD-10-CM

## 2024-10-01 DIAGNOSIS — R42 Dizziness and giddiness: Secondary | ICD-10-CM | POA: Diagnosis not present

## 2024-10-01 DIAGNOSIS — H6123 Impacted cerumen, bilateral: Secondary | ICD-10-CM | POA: Diagnosis not present

## 2024-12-09 ENCOUNTER — Ambulatory Visit: Admitting: Cardiology
